# Patient Record
Sex: Male | Born: 1940
Health system: Southern US, Community
[De-identification: ages and names within clinical notes are randomized; demographics above are authoritative.]

## PROBLEM LIST (undated history)

## (undated) DIAGNOSIS — I219 Acute myocardial infarction, unspecified: Secondary | ICD-10-CM

## (undated) DIAGNOSIS — T7840XA Allergy, unspecified, initial encounter: Secondary | ICD-10-CM

## (undated) DIAGNOSIS — I4891 Unspecified atrial fibrillation: Secondary | ICD-10-CM

## (undated) DIAGNOSIS — I509 Heart failure, unspecified: Secondary | ICD-10-CM

## (undated) DIAGNOSIS — M720 Palmar fascial fibromatosis [Dupuytren]: Secondary | ICD-10-CM

## (undated) DIAGNOSIS — K56609 Unspecified intestinal obstruction, unspecified as to partial versus complete obstruction: Secondary | ICD-10-CM

## (undated) HISTORY — DX: Acute myocardial infarction, unspecified: I21.9

## (undated) HISTORY — PX: COLON SURGERY: SHX602

## (undated) HISTORY — PX: TONSILLECTOMY: SUR1361

## (undated) HISTORY — DX: Unspecified atrial fibrillation: I48.91

## (undated) HISTORY — PX: TYMPANOPLASTY: SHX33

---

## 1998-02-24 ENCOUNTER — Emergency Department (HOSPITAL_COMMUNITY): Admission: EM | Admit: 1998-02-24 | Discharge: 1998-02-24 | Payer: Self-pay | Admitting: Emergency Medicine

## 1998-03-18 ENCOUNTER — Encounter: Admission: RE | Admit: 1998-03-18 | Discharge: 1998-03-18 | Payer: Self-pay | Admitting: Family Medicine

## 1998-04-01 ENCOUNTER — Encounter: Admission: RE | Admit: 1998-04-01 | Discharge: 1998-04-01 | Payer: Self-pay | Admitting: Family Medicine

## 2009-05-11 HISTORY — PX: DUPUYTREN CONTRACTURE RELEASE: SHX1478

## 2010-04-07 ENCOUNTER — Ambulatory Visit (HOSPITAL_BASED_OUTPATIENT_CLINIC_OR_DEPARTMENT_OTHER): Admission: RE | Admit: 2010-04-07 | Discharge: 2010-04-07 | Payer: Self-pay | Admitting: Orthopedic Surgery

## 2010-06-30 NOTE — Op Note (Signed)
NAMEKAZIMIR, DEVISSER               ACCOUNT NO.:  [id number]  MEDICAL RECORD NO.:  [id number]          PATIENT TYPE:  AMB  LOCATION:  [place]                          FACILITY:  [facility]  PHYSICIAN:  Daryll Brod, M.D.       DATE OF BIRTH:  1940-06-26  DATE OF PROCEDURE:  04/07/2010 DATE OF DISCHARGE:                              OPERATIVE REPORT   PREOPERATIVE DIAGNOSIS:  Dupuytren contracture, left hand.  POSTOPERATIVE DIAGNOSIS:  Dupuytren contracture, left hand.  OPERATION:  Excision of palmar cords, thumb, index middle ring and little fingers first webspace x2 just to his left hand.  SURGEON:  Daryll Brod, MD  ASSISTANT:  Dimas Millin, RN  ANESTHESIA:  Axillary general.  ANESTHESIOLOGIST:  Leda Quail, MD  HISTORY:  The patient is a 70 year old male with a history of Dupuytren contracture cords to the middle ring little fingers with contracture of his little finger, contracture of his thumb, and first webspace contracture.  He was elected to have this excised.  Pre, peri, and postoperative course have been discussed along with risks and complications.  He is aware that there was no guarantee with surgery, possibility of infection, recurrence of injury to arteries, nerves, and tendons, incomplete relief of symptoms, and dystrophy.  In the preoperative area, the patient is seen, the extremity marked by both the patient and surgeon and antibiotic given.  PROCEDURE:  The patient was brought to the operating room where an axillary block was carried out without difficulty.  Following this, the general anesthetic was also given.  He was prepped using ChloraPrep, supine position, left arm free.  A 3-minute dry time was allowed.  Time- out taken confirming the patient and procedure.  The limb was exsanguinated with an Esmarch bandage.  The tourniquet was placed high on the upper arm was inflated to 250 mmHg.  A volar Brunner incision was made on the little finger carried down through the  subcutaneous tissue. This extended from the palm all the way out to the distal interphalangeal joint, carried down through the subcutaneous tissue. Bleeders were electrocauterized with bipolar.  Palmar fascia was identified at its attachment to the carpal retinaculum.  The neurovascular structures were identified.  The carpal retinaculum juncture where the palmar fascia was then cut across the entire palm. This allowed the cord to be elevated and traced distally.  The cords were then separated middle ring and little, this allowed visualization to each of the proper digital nerves along with common digital nerves to each of the fingers.  The little finger cord was traced distally.  This went up the radial side of his finger.  A second cord was present on the abductor digiti quinti going out to the level of the middle phalanx. With blunt and sharp dissection, this was dissected free.  The neurovascular bundles both radially and ulnarly were identified and protected throughout the procedure.  The entire cord was excised.  The finger came fully straight, separate incision was then made over the ring finger, carried down through the subcutaneous tissue.  The skin bridge was left intact.  The palmar fascia proximally was elevated allowing visualization  to the neurovascular bundles.  The cord was then excised.  This was to the level of the A2 pulley.  Care was taken to protect neurovascular structures both radially and ulnarly.  The cord was sent to pathology along with the little finger cord.  Separate incision was then made on the volar again, a Brunner incision to the middle finger.  Again, the cord to the middle finger was traced distally inserting on the A2 pulley.  This was excised and sent protecting the neurovascular bundles.  The cord to the thumb was attended to next. This involved a cord up the radial side of the digit.  Volar Brunner type incision was made.  This was carried down  through the subcutaneous tissue.  Bleeders were again electrocauterized with bipolar.  The radial and ulnar digital artery and nerves were identified and protected.  The cord was then removed to the level of the distal proximal phalanx.  This allowed the thumb metacarpophalangeal joint to come nearly straight.  A secondary cord was attached to the transverse palmar fascia.  This proceeded over to the index finger.  This was traced from the radial cord on his thumb across to the index finger.  A separate incision was then made in the webspace allowing visualization of the neurovascular structures and this cord was excised in toto.  This was also sent to pathology along with the thumb cord.  A separate incision was then made over the distal webspace where another cord was identified.  This was transected and removing a segment approximately a 0.5 cm to 1 cm in length.  This allowed the webspace to then advance.  The wounds were copiously irrigated with saline.  Thrombin spray was then sprayed into each of the wound.  A doubled over vessel loop was then passed at little finger and each of the middle and ring brought out proximally.  These were converted to lysed and the skin closed with interrupted 5-0 Vicryl Rapide sutures.  A compressive dressing was applied.  The tourniquet was deflated.  All fingers were immediately pinked.  A dorsal splint was applied.  The dressing completed and the patient was taken to the recovery room for observation in satisfactory condition.  Tourniquet time was just under 2 hours.  The patient tolerated the procedure well. He will be discharged home to return to the [facility] in 1 week, on Vicodin.          ______________________________ Daryll Brod, M.D.     GK/MEDQ  D:  04/07/2010  T:  04/08/2010  Job:  [id number]  Electronically Signed by Daryll Brod M.D. on 06/30/2010 12:27:58 PM

## 2010-07-22 LAB — POCT HEMOGLOBIN-HEMACUE: Hemoglobin: 16.9 g/dL (ref 13.0–17.0)

## 2014-08-30 ENCOUNTER — Other Ambulatory Visit: Payer: Self-pay | Admitting: Gastroenterology

## 2015-04-26 ENCOUNTER — Encounter (HOSPITAL_BASED_OUTPATIENT_CLINIC_OR_DEPARTMENT_OTHER): Payer: Self-pay | Admitting: *Deleted

## 2015-04-26 ENCOUNTER — Other Ambulatory Visit: Payer: Self-pay | Admitting: Orthopedic Surgery

## 2015-05-02 ENCOUNTER — Encounter (HOSPITAL_BASED_OUTPATIENT_CLINIC_OR_DEPARTMENT_OTHER)
Admission: RE | Disposition: A | Discharge status: Home / Self Care | Payer: Self-pay | Source: Ambulatory Visit | Attending: Orthopedic Surgery

## 2015-05-02 ENCOUNTER — Ambulatory Visit (HOSPITAL_BASED_OUTPATIENT_CLINIC_OR_DEPARTMENT_OTHER): Payer: Medicare HMO | Admitting: Anesthesiology

## 2015-05-02 ENCOUNTER — Encounter (HOSPITAL_BASED_OUTPATIENT_CLINIC_OR_DEPARTMENT_OTHER): Payer: Self-pay | Admitting: Orthopedic Surgery

## 2015-05-02 ENCOUNTER — Ambulatory Visit (HOSPITAL_BASED_OUTPATIENT_CLINIC_OR_DEPARTMENT_OTHER)
Admission: RE | Admit: 2015-05-02 | Discharge: 2015-05-02 | Disposition: A | Discharge status: Home / Self Care | Payer: Medicare HMO | Source: Ambulatory Visit | Attending: Orthopedic Surgery | Admitting: Orthopedic Surgery

## 2015-05-02 DIAGNOSIS — M720 Palmar fascial fibromatosis [Dupuytren]: Secondary | ICD-10-CM | POA: Diagnosis present

## 2015-05-02 DIAGNOSIS — F17200 Nicotine dependence, unspecified, uncomplicated: Secondary | ICD-10-CM | POA: Diagnosis not present

## 2015-05-02 DIAGNOSIS — Z79899 Other long term (current) drug therapy: Secondary | ICD-10-CM | POA: Insufficient documentation

## 2015-05-02 DIAGNOSIS — M19041 Primary osteoarthritis, right hand: Secondary | ICD-10-CM | POA: Insufficient documentation

## 2015-05-02 HISTORY — PX: FASCIECTOMY: SHX6525

## 2015-05-02 HISTORY — DX: Palmar fascial fibromatosis (dupuytren): M72.0

## 2015-05-02 HISTORY — DX: Allergy, unspecified, initial encounter: T78.40XA

## 2015-05-02 SURGERY — FASCIECTOMY, PALM
Anesthesia: Monitor Anesthesia Care | Site: Hand | Laterality: Right

## 2015-05-02 MED ORDER — HYDROCODONE-ACETAMINOPHEN 10-325 MG PO TABS: 1.0000 | ORAL_TABLET | Freq: Four times a day (QID) | ORAL | Status: DC | PRN

## 2015-05-02 MED ORDER — MIDAZOLAM HCL 2 MG/2ML IJ SOLN
1.0000 mg | INTRAMUSCULAR | Status: DC | PRN
  Administered 2015-05-02: 2 mg via INTRAVENOUS

## 2015-05-02 MED ORDER — CEFAZOLIN SODIUM-DEXTROSE 2-3 GM-% IV SOLR
INTRAVENOUS | Status: AC
  Filled 2015-05-02: qty 50

## 2015-05-02 MED ORDER — CEFAZOLIN SODIUM-DEXTROSE 2-3 GM-% IV SOLR: 2.0000 g | INTRAVENOUS | Status: DC

## 2015-05-02 MED ORDER — SCOPOLAMINE 1 MG/3DAYS TD PT72: 1.0000 | MEDICATED_PATCH | Freq: Once | TRANSDERMAL | Status: DC

## 2015-05-02 MED ORDER — CEFAZOLIN SODIUM-DEXTROSE 2-3 GM-% IV SOLR
2.0000 g | INTRAVENOUS | Status: AC
  Administered 2015-05-02: 2 g via INTRAVENOUS

## 2015-05-02 MED ORDER — HYDROCODONE-ACETAMINOPHEN 7.5-325 MG PO TABS: 1.0000 | ORAL_TABLET | Freq: Once | ORAL | Status: DC | PRN

## 2015-05-02 MED ORDER — LACTATED RINGERS IV SOLN
INTRAVENOUS | Status: DC
  Administered 2015-05-02 (×2): via INTRAVENOUS

## 2015-05-02 MED ORDER — FENTANYL CITRATE (PF) 100 MCG/2ML IJ SOLN
50.0000 ug | INTRAMUSCULAR | Status: DC | PRN
  Administered 2015-05-02: 100 ug via INTRAVENOUS

## 2015-05-02 MED ORDER — PROMETHAZINE HCL 25 MG/ML IJ SOLN: 6.2500 mg | INTRAMUSCULAR | Status: DC | PRN

## 2015-05-02 MED ORDER — PROPOFOL 500 MG/50ML IV EMUL
INTRAVENOUS | Status: DC | PRN
  Administered 2015-05-02: 100 ug/kg/min via INTRAVENOUS

## 2015-05-02 MED ORDER — MEPERIDINE HCL 25 MG/ML IJ SOLN: 6.2500 mg | INTRAMUSCULAR | Status: DC | PRN

## 2015-05-02 MED ORDER — CHLORHEXIDINE GLUCONATE 4 % EX LIQD: 60.0000 mL | Freq: Once | CUTANEOUS | Status: DC

## 2015-05-02 MED ORDER — PROPOFOL 10 MG/ML IV BOLUS
INTRAVENOUS | Status: AC
  Filled 2015-05-02: qty 20

## 2015-05-02 MED ORDER — DEXAMETHASONE SODIUM PHOSPHATE 4 MG/ML IJ SOLN
INTRAMUSCULAR | Status: DC | PRN
  Administered 2015-05-02: 10 mg via INTRAVENOUS

## 2015-05-02 MED ORDER — THROMBIN 20000 UNITS EX KIT
PACK | CUTANEOUS | Status: DC | PRN
  Administered 2015-05-02: 5000 [IU] via TOPICAL

## 2015-05-02 MED ORDER — PROPOFOL 10 MG/ML IV BOLUS
INTRAVENOUS | Status: DC | PRN
  Administered 2015-05-02: 20 mg via INTRAVENOUS

## 2015-05-02 MED ORDER — THROMBIN 5000 UNITS EX SOLR
CUTANEOUS | Status: AC
  Filled 2015-05-02: qty 5000

## 2015-05-02 MED ORDER — HYDROMORPHONE HCL 1 MG/ML IJ SOLN: 0.2500 mg | INTRAMUSCULAR | Status: DC | PRN

## 2015-05-02 MED ORDER — GLYCOPYRROLATE 0.2 MG/ML IJ SOLN: 0.2000 mg | Freq: Once | INTRAMUSCULAR | Status: DC | PRN

## 2015-05-02 MED ORDER — ONDANSETRON HCL 4 MG/2ML IJ SOLN
INTRAMUSCULAR | Status: DC | PRN
  Administered 2015-05-02: 4 mg via INTRAVENOUS

## 2015-05-02 MED ORDER — MIDAZOLAM HCL 2 MG/2ML IJ SOLN
INTRAMUSCULAR | Status: AC
  Filled 2015-05-02: qty 2

## 2015-05-02 MED ORDER — BUPIVACAINE-EPINEPHRINE (PF) 0.5% -1:200000 IJ SOLN
INTRAMUSCULAR | Status: DC | PRN
  Administered 2015-05-02: 30 mL via PERINEURAL

## 2015-05-02 MED ORDER — FENTANYL CITRATE (PF) 100 MCG/2ML IJ SOLN
INTRAMUSCULAR | Status: AC
  Filled 2015-05-02: qty 2

## 2015-05-02 SURGICAL SUPPLY — 38 items
BLADE MINI RND TIP GREEN BEAV (BLADE) ×2 IMPLANT
BLADE SURG 15 STRL LF DISP TIS (BLADE) ×1 IMPLANT
BLADE SURG 15 STRL SS (BLADE) ×1
BNDG COHESIVE 3X5 TAN STRL LF (GAUZE/BANDAGES/DRESSINGS) ×2 IMPLANT
BNDG ESMARK 4X9 LF (GAUZE/BANDAGES/DRESSINGS) ×2 IMPLANT
BNDG GAUZE ELAST 4 BULKY (GAUZE/BANDAGES/DRESSINGS) ×2 IMPLANT
CHLORAPREP W/TINT 26ML (MISCELLANEOUS) ×2 IMPLANT
CORDS BIPOLAR (ELECTRODE) ×2 IMPLANT
COVER BACK TABLE 60X90IN (DRAPES) ×2 IMPLANT
COVER MAYO STAND STRL (DRAPES) ×2 IMPLANT
CUFF TOURNIQUET SINGLE 18IN (TOURNIQUET CUFF) ×2 IMPLANT
DECANTER SPIKE VIAL GLASS SM (MISCELLANEOUS) IMPLANT
DRAPE EXTREMITY T 121X128X90 (DRAPE) ×2 IMPLANT
DRAPE SURG 17X23 STRL (DRAPES) ×2 IMPLANT
GAUZE SPONGE 4X4 12PLY STRL (GAUZE/BANDAGES/DRESSINGS) ×2 IMPLANT
GAUZE XEROFORM 1X8 LF (GAUZE/BANDAGES/DRESSINGS) ×2 IMPLANT
GLOVE BIOGEL PI IND STRL 8.5 (GLOVE) ×1 IMPLANT
GLOVE BIOGEL PI INDICATOR 8.5 (GLOVE) ×1
GLOVE ECLIPSE 6.5 STRL STRAW (GLOVE) ×2 IMPLANT
GLOVE SURG ORTHO 8.0 STRL STRW (GLOVE) ×2 IMPLANT
GOWN STRL REUS W/ TWL LRG LVL3 (GOWN DISPOSABLE) ×1 IMPLANT
GOWN STRL REUS W/TWL LRG LVL3 (GOWN DISPOSABLE) ×1
GOWN STRL REUS W/TWL XL LVL3 (GOWN DISPOSABLE) ×2 IMPLANT
LOOP VESSEL MAXI BLUE (MISCELLANEOUS) ×2 IMPLANT
NS IRRIG 1000ML POUR BTL (IV SOLUTION) ×2 IMPLANT
PACK BASIN DAY SURGERY FS (CUSTOM PROCEDURE TRAY) ×2 IMPLANT
PAD CAST 3X4 CTTN HI CHSV (CAST SUPPLIES) ×1 IMPLANT
PADDING CAST COTTON 3X4 STRL (CAST SUPPLIES) ×1
SLEEVE SCD COMPRESS KNEE MED (MISCELLANEOUS) ×2 IMPLANT
SPLINT PLASTER CAST XFAST 3X15 (CAST SUPPLIES) IMPLANT
SPLINT PLASTER XTRA FASTSET 3X (CAST SUPPLIES)
STOCKINETTE 4X48 STRL (DRAPES) ×2 IMPLANT
SUT ETHILON 4 0 PS 2 18 (SUTURE) ×2 IMPLANT
SUT SILK 2 0 FS (SUTURE) ×2 IMPLANT
SYR BULB 3OZ (MISCELLANEOUS) ×2 IMPLANT
SYR CONTROL 10ML LL (SYRINGE) ×2 IMPLANT
TOWEL OR 17X24 6PK STRL BLUE (TOWEL DISPOSABLE) ×4 IMPLANT
UNDERPAD 30X30 (UNDERPADS AND DIAPERS) ×2 IMPLANT

## 2015-05-02 NOTE — Transfer of Care (Signed)
Immediate Anesthesia Transfer of Care Note  Patient: Mario Proctor  Procedure(s) Performed: Procedure(s) with comments: FASCIECTOMY RIGHT SMALL FINGER (Right) - axillary block in preop  Patient Location: PACU  Anesthesia Type:MAC combined with regional for post-op pain  Level of Consciousness: awake, alert  and oriented  Airway & Oxygen Therapy: Patient Spontanous Breathing and Patient connected to face mask oxygen  Post-op Assessment: Report given to RN and Post -op Vital signs reviewed and stable  Post vital signs: Reviewed and stable  Last Vitals:  Filed Vitals:   05/02/15 0955 05/02/15 1000  BP: 146/75 129/67  Pulse: 82 82  Temp:    Resp: 16     Complications: No apparent anesthesia complications

## 2015-05-02 NOTE — Discharge Instructions (Addendum)

## 2015-05-02 NOTE — Brief Op Note (Signed)
05/02/2015  11:19 AM  PATIENT:  Mario Proctor  74 y.o. male  PRE-OPERATIVE DIAGNOSIS:  DUPURTREN'S RIGHT SMALL FINGER  POST-OPERATIVE DIAGNOSIS:  DUPURTREN'S RIGHT SMALL FINGER  PROCEDURE:  Procedure(s) with comments: FASCIECTOMY RIGHT SMALL FINGER (Right) - axillary block in preop  SURGEON:  Surgeon(s) and Role:    * Daryll Brod, MD - Primary  PHYSICIAN ASSISTANT:   ASSISTANTS: none   ANESTHESIA:   regional and IV sedation  EBL:  Total I/O In: 1000 [I.V.:1000] Out: -   BLOOD ADMINISTERED:none  DRAINS: vessel loops   LOCAL MEDICATIONS USED:  NONE  SPECIMEN:  Excision  DISPOSITION OF SPECIMEN:  PATHOLOGY  COUNTS:  YES  TOURNIQUET:   Total Tourniquet Time Documented: Upper Arm (Right) - 41 minutes Total: Upper Arm (Right) - 41 minutes   DICTATION: .Other Dictation: Dictation Number [id number]  PLAN OF CARE: Discharge to home after PACU  PATIENT DISPOSITION:  PACU - hemodynamically stable.

## 2015-05-02 NOTE — Anesthesia Procedure Notes (Signed)
Anesthesia Regional Block:  Axillary brachial plexus block  Pre-Anesthetic Checklist: ,, timeout performed, Correct Patient, Correct Site, Correct Laterality, Correct Procedure, Correct Position, site marked, Risks and benefits discussed, Surgical consent,  Pre-op evaluation,  Post-op pain management  Laterality: Right  Prep: chloraprep       Needles:  Injection technique: Single-shot  Needle Type: Stimulator Needle - 40     Needle Length: 4cm 4 cm Needle Gauge: 22 and 22 G    Additional Needles:  Procedures: ultrasound guided (picture in chart) Axillary brachial plexus block Narrative:  Injection made incrementally with aspirations every 5 mL. Anesthesiologist: Nolon Nations  Additional Notes: BP cuff, EKG monitors applied. Sedation begun. Nerve location verified with U/S. Anesthetic injected incrementally, slowly , and after neg aspirations under direct u/s guidance. Good perineural spread. Tolerated well.

## 2015-05-02 NOTE — Op Note (Signed)
Mario Proctor, Mario Proctor              ACCOUNT NO.:  [id number]  MEDICAL RECORD NO.:  [id number]  LOCATION:                                 FACILITY:  PHYSICIAN:  Mario Proctor, M.D.            DATE OF BIRTH:  DATE OF PROCEDURE:  05/02/2015 DATE OF DISCHARGE:                              OPERATIVE REPORT   POSTOPERATIVE DIAGNOSIS:  Dupuytren's contracture, right small finger with flexion deformity to the metacarpophalangeal, proximal interphalangeal, distal interphalangeal joints.  POSTOPERATIVE DIAGNOSIS:  Dupuytren's contracture, right small finger with flexion deformity to the metacarpophalangeal, proximal interphalangeal, distal interphalangeal joints.  OPERATION:  Fasciectomy of right small finger.  SURGEON:  Mario Proctor, M.D.  ANESTHESIA:  Axillary block with sedation.  ANESTHESIOLOGIST:  Cleon Dew. Kalman Shan, M.D.  HISTORY:  The patient is a 74 year old male with a history of Dupuytren's contracture to his right hand small finger with a radial- sided cord going up to the distal phalanx.  He has elected to undergo fasciotomy, being aware of other alternatives.  Pre, peri and postoperative course have been discussed along with risks and complications.  He is aware that there is no guarantee with the surgery; possibility of infection; recurrence of injury to arteries, nerves, tendons; incomplete relief of symptoms; and dystrophy and the fact that he may not regain full extension of the PIP and DIP joints, having surgery to correct this to full extension.  He may not be capable of maintaining this following the excision.  He is potential for extension and recurrence of Dupuytren's rotatory nature.  The patient was seen in the preoperative area, the extremity marked by both the patient and surgeon.  Antibiotic given.  DESCRIPTION OF PROCEDURE:  The patient was brought to the operating room.  After an axillary block was carried out without difficulty, he was prepped using ChloraPrep,  supine position with the right arm free. A 3-minute dry time was allowed.  Time-out taken, confirming the patient and procedure.  The limb was exsanguinated with an Esmarch bandage. Tourniquet placed high on the arm was inflated to 250 mmHg.  A volar Bruner incision was made in the midpalm of the small finger and carried down through the subcutaneous tissue.  The cord was immediately identified.  This was traced distally extending the incision gradually out to the level of the distal interphalangeal joint.  Neurovascular bundles were identified and protected throughout the procedure.  The cord was identified proximally, this was traced distally.  A natatory cord was present going to the ring finger.  This was transected.  Neurovascular bundles were spiraled around this.  These were maintained especially on the radial side. There was extension over to the ulnar aspect of the small finger with the central cord.  There was a large lateral digital sheet cord with this arising off from the lumbrical muscle on the radial side.  This was traced distally.  There was a second spiral with the neurovascular bundle at the level of the PIP joint.  This was traced up to the distal interphalangeal joint where the cord was transected just proximal to the distal interphalangeal join. Due to the fact that there was  separation of the artery and nerve, with the artery going dorsal to the cord and the nerve going volar to the cord, it was felt too dangerous to try to follow the artery.  The artery was now approximately 0.5 mm in size.  The cord was transected.  This allowed the distal interphalangeal joint to come fully straight. Following transection of the attachments of the lateral digital sheet cord and the connections to the middle phalanx the PIP joint came fully straight also.  The specimen was sent to Pathology. The neurovascular bundles were traced over their entire course, found to be intact.  The  wound was copiously irrigated with saline.  Thrombin was then instilled.  A doubled-over vessel loop drain was placed to the depths of the wound and these were converted to wise.  The skin was then closed with interrupted 4-0 nylon sutures.  The tourniquet was deflated. Bleeders were electrocauterized throughout the procedure.  The finger was immediately pinked.  A dorsal splint was then applied.  The patient was taken to the recovery room for observation in satisfactory condition.  He will be discharged to home to return to the [facility] in 1 week, on Norco.          ______________________________ Mario Proctor, M.D.     GK/MEDQ  D:  05/02/2015  T:  05/02/2015  Job:  [id number]

## 2015-05-02 NOTE — Progress Notes (Signed)
Assisted Dr. Germeroth with right, ultrasound guided, axillary block. Side rails up, monitors on throughout procedure. See vital signs in flow sheet. Tolerated Procedure well. 

## 2015-05-02 NOTE — Anesthesia Preprocedure Evaluation (Signed)
Anesthesia Evaluation  Patient identified by MRN, date of birth, ID band Patient awake    Reviewed: Allergy & Precautions, NPO status , Patient's Chart, lab work & pertinent test results  Airway Mallampati: II  TM Distance: >3 FB Neck ROM: Full    Dental no notable dental hx.    Pulmonary Current Smoker,    Pulmonary exam normal breath sounds clear to auscultation       Cardiovascular negative cardio ROS Normal cardiovascular exam Rhythm:Regular Rate:Normal     Neuro/Psych negative neurological ROS  negative psych ROS   GI/Hepatic negative GI ROS, Neg liver ROS,   Endo/Other  negative endocrine ROS  Renal/GU negative Renal ROS     Musculoskeletal negative musculoskeletal ROS (+)   Abdominal   Peds  Hematology negative hematology ROS (+)   Anesthesia Other Findings   Reproductive/Obstetrics negative OB ROS                             Anesthesia Physical Anesthesia Plan  ASA: II  Anesthesia Plan: Regional   Post-op Pain Management:    Induction: Intravenous  Airway Management Planned:   Additional Equipment:   Intra-op Plan:   Post-operative Plan:   Informed Consent: I have reviewed the patients History and Physical, chart, labs and discussed the procedure including the risks, benefits and alternatives for the proposed anesthesia with the patient or authorized representative who has indicated his/her understanding and acceptance.   Dental advisory given  Plan Discussed with: CRNA  Anesthesia Plan Comments:         Anesthesia Quick Evaluation

## 2015-05-02 NOTE — Op Note (Signed)
Dictation Number [id number]

## 2015-05-02 NOTE — H&P (Signed)
  Mario Proctor is an 74 y.o. male.   Chief Complaint: He is a 74 year old right hand dominant male who has not been seen in six to seven years. He states that he has had an excellent result from the fasciectomy on his left hand and is desirous of proceeding to have his right Dupuytren's taken care of. His little finger has progressed over the past six months. He has no history of injury. He does smoke. He is advised to quit and the reasons for this. He is well aware of the etiology of Dupuytren's. He has no history of diabetes, thyroid problems. 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 He has no history of gout. Family history is negative. He is desirous of proceeding to have his right side taken care of.  HPI: see above  Past Medical History  Diagnosis Date  . Allergy   . Dupuytren contracture     right sm finger    Past Surgical History  Procedure Laterality Date  . Colon surgery      bowel blockage  . Tympanoplasty Right   . Tonsillectomy    . Dupuytren contracture release Left 2011    History reviewed. No pertinent family history. Social History:  reports that he has been smoking.  He does not have any smokeless tobacco history on file. He reports that he drinks alcohol. He reports that he does not use illicit drugs.  Allergies:  Allergies  Allergen Reactions  . Oxycodone Rash    Medications Prior to Admission  Medication Sig Dispense Refill  . loratadine (CLARITIN) 10 MG tablet Take 10 mg by mouth daily.      No results found for this or any previous visit (from the past 48 hour(s)).  No results found.   Pertinent items are noted in HPI.  Height 5\' 11"  (1.803 m), weight 72.576 kg (160 lb).  General appearance: alert, cooperative and appears stated age Head: Normocephalic, without obvious abnormality Neck: no JVD Resp: clear to auscultation bilaterally Cardio: regular rate and rhythm, S1, S2 normal, no murmur, click, rub or gallop GI: soft,  non-tender; bowel sounds normal; no masses,  no organomegaly Extremities: contracture right small finger Pulses: 2+ and symmetric Skin: Skin color, texture, turgor normal. No rashes or lesions Neurologic: Grossly normal Incision/Wound: na  Assessment/Plan Assessment:  1. 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 We have discussed various treatment alternatives of the Dupuytren's contracture including aponeurotomy, Xiaflex injection, fasciotomy, fasciectomy. He has had fasciectomy on his left side. He would like to proceed with fasciectomy on his right small finger. Postoperative course was discussed along with risks and complications. He is aware that there is no guarantee with the surgery, possibility of infection, recurrence, injury to arteries, nerves, tendons, incomplete retun of full extension to the PIP joint, dystrophy, loss of a finger, numbness and tingling. He is scheduled for fasciectomy right small fingers as an outpatient under regional anesthesia.   Krzysztof Reichelt R 05/02/2015, 8:42 AM

## 2015-05-02 NOTE — Anesthesia Postprocedure Evaluation (Signed)
Anesthesia Post Note  Patient: Mario Proctor  Procedure(s) Performed: Procedure(s) (LRB): FASCIECTOMY RIGHT SMALL FINGER (Right)  Patient location during evaluation: PACU Anesthesia Type: MAC and Regional Level of consciousness: awake and alert Pain management: pain level controlled Vital Signs Assessment: post-procedure vital signs reviewed and stable Respiratory status: spontaneous breathing Cardiovascular status: stable Anesthetic complications: no    Last Vitals:  Filed Vitals:   05/02/15 1125 05/02/15 1300  BP: 122/76 149/89  Pulse: 76 76  Temp: 36.6 C 36.4 C  Resp:  16    Last Pain: There were no vitals filed for this visit.               Nolon Nations

## 2015-05-03 ENCOUNTER — Encounter (HOSPITAL_BASED_OUTPATIENT_CLINIC_OR_DEPARTMENT_OTHER): Payer: Self-pay | Admitting: Orthopedic Surgery

## 2015-05-03 MED FILL — Thrombin For Soln 5000 Unit: CUTANEOUS | Qty: 5000 | Status: AC

## 2016-05-05 DIAGNOSIS — Z0000 Encounter for general adult medical examination without abnormal findings: Secondary | ICD-10-CM | POA: Diagnosis not present

## 2016-05-05 DIAGNOSIS — Z6821 Body mass index (BMI) 21.0-21.9, adult: Secondary | ICD-10-CM | POA: Diagnosis not present

## 2016-06-23 DIAGNOSIS — I498 Other specified cardiac arrhythmias: Secondary | ICD-10-CM | POA: Diagnosis not present

## 2016-06-23 DIAGNOSIS — J441 Chronic obstructive pulmonary disease with (acute) exacerbation: Secondary | ICD-10-CM | POA: Diagnosis not present

## 2016-06-23 DIAGNOSIS — J40 Bronchitis, not specified as acute or chronic: Secondary | ICD-10-CM | POA: Diagnosis not present

## 2016-07-22 DIAGNOSIS — J40 Bronchitis, not specified as acute or chronic: Secondary | ICD-10-CM | POA: Diagnosis not present

## 2016-07-22 DIAGNOSIS — I498 Other specified cardiac arrhythmias: Secondary | ICD-10-CM | POA: Diagnosis not present

## 2016-07-24 DIAGNOSIS — I499 Cardiac arrhythmia, unspecified: Secondary | ICD-10-CM | POA: Diagnosis not present

## 2016-08-13 DIAGNOSIS — H16002 Unspecified corneal ulcer, left eye: Secondary | ICD-10-CM | POA: Diagnosis not present

## 2016-08-18 DIAGNOSIS — H16042 Marginal corneal ulcer, left eye: Secondary | ICD-10-CM | POA: Diagnosis not present

## 2016-08-20 DIAGNOSIS — T1502XD Foreign body in cornea, left eye, subsequent encounter: Secondary | ICD-10-CM | POA: Diagnosis not present

## 2016-09-11 DIAGNOSIS — I493 Ventricular premature depolarization: Secondary | ICD-10-CM | POA: Diagnosis not present

## 2016-09-11 DIAGNOSIS — Z136 Encounter for screening for cardiovascular disorders: Secondary | ICD-10-CM | POA: Diagnosis not present

## 2016-09-11 DIAGNOSIS — R0602 Shortness of breath: Secondary | ICD-10-CM | POA: Diagnosis not present

## 2016-09-11 DIAGNOSIS — J432 Centrilobular emphysema: Secondary | ICD-10-CM | POA: Diagnosis not present

## 2016-09-18 DIAGNOSIS — R0602 Shortness of breath: Secondary | ICD-10-CM | POA: Diagnosis not present

## 2016-09-18 DIAGNOSIS — R69 Illness, unspecified: Secondary | ICD-10-CM | POA: Diagnosis not present

## 2016-10-06 DIAGNOSIS — R0602 Shortness of breath: Secondary | ICD-10-CM | POA: Diagnosis not present

## 2016-10-06 DIAGNOSIS — J432 Centrilobular emphysema: Secondary | ICD-10-CM | POA: Diagnosis not present

## 2016-10-06 DIAGNOSIS — Z136 Encounter for screening for cardiovascular disorders: Secondary | ICD-10-CM | POA: Diagnosis not present

## 2016-10-06 DIAGNOSIS — I493 Ventricular premature depolarization: Secondary | ICD-10-CM | POA: Diagnosis not present

## 2016-10-12 DIAGNOSIS — J441 Chronic obstructive pulmonary disease with (acute) exacerbation: Secondary | ICD-10-CM | POA: Diagnosis not present

## 2016-10-12 DIAGNOSIS — H6692 Otitis media, unspecified, left ear: Secondary | ICD-10-CM | POA: Diagnosis not present

## 2016-10-15 ENCOUNTER — Ambulatory Visit (INDEPENDENT_AMBULATORY_CARE_PROVIDER_SITE_OTHER)
Admission: RE | Admit: 2016-10-15 | Discharge: 2016-10-15 | Disposition: A | Discharge status: Home / Self Care | Payer: Medicare HMO | Source: Ambulatory Visit | Attending: Pulmonary Disease | Admitting: Pulmonary Disease

## 2016-10-15 ENCOUNTER — Ambulatory Visit (INDEPENDENT_AMBULATORY_CARE_PROVIDER_SITE_OTHER): Payer: Medicare HMO | Admitting: Pulmonary Disease

## 2016-10-15 ENCOUNTER — Encounter: Payer: Self-pay | Admitting: Pulmonary Disease

## 2016-10-15 VITALS — BP 142/70 | HR 85 | Ht 71.0 in | Wt 152.8 lb

## 2016-10-15 DIAGNOSIS — F17210 Nicotine dependence, cigarettes, uncomplicated: Secondary | ICD-10-CM | POA: Diagnosis not present

## 2016-10-15 DIAGNOSIS — R0602 Shortness of breath: Secondary | ICD-10-CM

## 2016-10-15 DIAGNOSIS — R69 Illness, unspecified: Secondary | ICD-10-CM | POA: Diagnosis not present

## 2016-10-15 MED ORDER — ALBUTEROL SULFATE HFA 108 (90 BASE) MCG/ACT IN AERS: 2.0000 | INHALATION_SPRAY | Freq: Four times a day (QID) | RESPIRATORY_TRACT | 6 refills | Status: DC | PRN

## 2016-10-15 NOTE — Patient Instructions (Signed)
Will schedule you for chest x-ray today and pulmonary function tests We give a prescription for albuterol rescue inhaler Continue to work on quitting smoking Return to clinic in 1-2 months.

## 2016-10-15 NOTE — Progress Notes (Signed)
Mario Proctor    [id number]    [birth year]  Primary Care Physician:System, Pcp Not In  Referring Physician: [name], MD [address], [place] [address]  Chief complaint:  Consult for evaluation of dyspnea  HPI:  [name] is a 76 year old with active smoking history, allergies. He has symptoms of dyspnea on exertion for the past several years. He gets short of breath with excessive physical activity but is able to carry out his normal daily activities. He is able to climb a flight of stairs, walk to his mailbox. 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 He has chronic cough with minimal sputum production. No wheezing, hemoptysis, fevers, chills. He had been on albuterol many years ago and is currently not using any inhaler medication.   He was evaluated by Dr. Einar Gip for frequent PVCs. He was unable to complete the treadmill stress test due to marked dyspnea and PVCs. An echocardiogram and a pharmacologic stress test has been done and he has a follow-up in cardiology clinic this week.  Pets: Cats, dog. No birds, exotic pets, farm animals Occupation: Worked as a Geophysicist/field seismologist for [employer]. Exposures: Has some exposure to lead in his line of work. Denies exposure to asbestos, dust, etc., Smoking history: 54-pack-year smoking history. Continues to smoke 1 pack per day  Outpatient Encounter Prescriptions as of 10/15/2016  Medication Sig  . [DISCONTINUED] HYDROcodone-acetaminophen (NORCO) 10-325 MG tablet Take 1 tablet by mouth every 6 (six) hours as needed.  . [DISCONTINUED] loratadine (CLARITIN) 10 MG tablet Take 10 mg by mouth daily.   No facility-administered encounter medications on file as of 10/15/2016.     Allergies as of 10/15/2016 - Review Complete 10/15/2016  Allergen Reaction Noted  . Oxycodone Rash 04/26/2015    Past Medical History:  Diagnosis Date  . Allergy   . Dupuytren contracture    right sm finger    Past Surgical  History:  Procedure Laterality Date  . COLON SURGERY     bowel blockage  . DUPUYTREN CONTRACTURE RELEASE Left 2011  . FASCIECTOMY Right 05/02/2015   Procedure: FASCIECTOMY RIGHT SMALL FINGER;  Surgeon: Daryll Brod, MD;  Location: [facility];  Service: Orthopedics;  Laterality: Right;  axillary block in preop  . TONSILLECTOMY    . TYMPANOPLASTY Right     Family History  Problem Relation Age of Onset  . Cancer Brother        "in heart and lungs"     Social History   Social History  . Marital status: Married    Spouse name: N/A  . Number of children: N/A  . Years of education: N/A   Occupational History  . Not on file.   Social History Main Topics  . Smoking status: Current Every Day Smoker    Packs/day: 1.00    Years: 54.00    Types: Cigarettes  . Smokeless tobacco: Never Used  . Alcohol use Yes     Comment: social  . Drug use: No  . Sexual activity: Not on file   Other Topics Concern  . Not on file   Social History Narrative  . No narrative on file    Review of systems: Review of Systems  Constitutional: Negative for fever and chills.  HENT: Negative.   Eyes: Negative for blurred vision.  Respiratory: as per HPI  Cardiovascular: Negative for chest pain and palpitations.  Gastrointestinal: Negative for  vomiting, diarrhea, blood per rectum. Genitourinary: Negative for dysuria, urgency, frequency and hematuria.  Musculoskeletal: Negative for myalgias, back pain and joint pain.  Skin: Negative for itching and rash.  Neurological: Negative for dizziness, tremors, focal weakness, seizures and loss of consciousness.  Endo/Heme/Allergies: Negative for environmental allergies.  Psychiatric/Behavioral: Negative for depression, suicidal ideas and hallucinations.  All other systems reviewed and are negative.  Physical Exam: Blood pressure (!) 142/70, pulse 85, height 5\' 11"  (1.803 m), weight 152 lb 12.8 oz (69.3 kg), SpO2 97 %. Gen:      No acute  distress HEENT:  EOMI, sclera anicteric Neck:     No masses; no thyromegaly Lungs:    Clear to auscultation bilaterally; normal respiratory effort CV:         Regular rate and rhythm; no murmurs Abd:      + bowel sounds; soft, non-tender; no palpable masses, no distension Ext:    No edema; adequate peripheral perfusion Skin:      Warm and dry; no rash Neuro: alert and oriented x 3 Psych: normal mood and affect  Data Reviewed: Treadmill stress stress 07/24/16 Suboptimal treadmill stress test due to marked dyspnea, frequent PVC  Assessment:  Evaluation for COPD, chronic bronchitis Likely has emphysema from heavy smoking. We will evaluate by getting a chest x-ray and pulmonary function tests. I'll start him on an albuterol rescue inhaler for now. We will reevaluate at return visit and determine if he needs to be on long-term controller medication after review of PFTs. He will need ambulatory O2 assessment as well  Active smoker Continues to smoke 1 pack per day. He has tried nicotine in the past without success. The discussion that, Wellbutrin but is not interested in those at present and wants to quit on his own. Time spent counseling-5 Minutes  Plan/Recommendations: - PFTs, CXR - Albuterol inhaler PRN - Smoking cessation.   Marshell Garfinkel MD [facility] Pager [phone] 10/15/2016, 11:37 AM  CC: [name], MD

## 2016-10-16 DIAGNOSIS — I5042 Chronic combined systolic (congestive) and diastolic (congestive) heart failure: Secondary | ICD-10-CM | POA: Diagnosis not present

## 2016-10-16 DIAGNOSIS — J432 Centrilobular emphysema: Secondary | ICD-10-CM | POA: Diagnosis not present

## 2016-10-16 DIAGNOSIS — R0602 Shortness of breath: Secondary | ICD-10-CM | POA: Diagnosis not present

## 2016-10-16 DIAGNOSIS — I493 Ventricular premature depolarization: Secondary | ICD-10-CM | POA: Diagnosis not present

## 2016-10-20 DIAGNOSIS — R69 Illness, unspecified: Secondary | ICD-10-CM | POA: Diagnosis not present

## 2016-10-20 DIAGNOSIS — I4891 Unspecified atrial fibrillation: Secondary | ICD-10-CM | POA: Diagnosis not present

## 2016-10-20 DIAGNOSIS — J9801 Acute bronchospasm: Secondary | ICD-10-CM | POA: Diagnosis not present

## 2016-10-20 DIAGNOSIS — R0682 Tachypnea, not elsewhere classified: Secondary | ICD-10-CM | POA: Diagnosis not present

## 2016-10-20 DIAGNOSIS — R0602 Shortness of breath: Secondary | ICD-10-CM | POA: Diagnosis not present

## 2016-10-20 DIAGNOSIS — R062 Wheezing: Secondary | ICD-10-CM | POA: Diagnosis not present

## 2016-10-30 ENCOUNTER — Ambulatory Visit
Admission: RE | Admit: 2016-10-30 | Discharge: 2016-10-30 | Disposition: A | Discharge status: Home / Self Care | Payer: Medicare HMO | Source: Ambulatory Visit | Attending: Otolaryngology | Admitting: Otolaryngology

## 2016-10-30 ENCOUNTER — Other Ambulatory Visit: Payer: Self-pay | Admitting: Otolaryngology

## 2016-10-30 DIAGNOSIS — H7292 Unspecified perforation of tympanic membrane, left ear: Secondary | ICD-10-CM | POA: Diagnosis not present

## 2016-10-30 DIAGNOSIS — I5042 Chronic combined systolic (congestive) and diastolic (congestive) heart failure: Secondary | ICD-10-CM | POA: Diagnosis not present

## 2016-10-30 DIAGNOSIS — J322 Chronic ethmoidal sinusitis: Secondary | ICD-10-CM

## 2016-10-30 DIAGNOSIS — J320 Chronic maxillary sinusitis: Secondary | ICD-10-CM | POA: Diagnosis not present

## 2016-10-30 DIAGNOSIS — J339 Nasal polyp, unspecified: Secondary | ICD-10-CM

## 2016-10-30 DIAGNOSIS — J330 Polyp of nasal cavity: Secondary | ICD-10-CM | POA: Diagnosis not present

## 2016-10-30 DIAGNOSIS — H6062 Unspecified chronic otitis externa, left ear: Secondary | ICD-10-CM | POA: Diagnosis not present

## 2016-10-30 DIAGNOSIS — H903 Sensorineural hearing loss, bilateral: Secondary | ICD-10-CM | POA: Diagnosis not present

## 2016-10-30 DIAGNOSIS — H9313 Tinnitus, bilateral: Secondary | ICD-10-CM | POA: Diagnosis not present

## 2016-10-30 DIAGNOSIS — J301 Allergic rhinitis due to pollen: Secondary | ICD-10-CM | POA: Diagnosis not present

## 2016-10-30 DIAGNOSIS — H6122 Impacted cerumen, left ear: Secondary | ICD-10-CM | POA: Diagnosis not present

## 2016-11-02 DIAGNOSIS — R0600 Dyspnea, unspecified: Secondary | ICD-10-CM | POA: Diagnosis present

## 2016-11-02 DIAGNOSIS — I255 Ischemic cardiomyopathy: Secondary | ICD-10-CM | POA: Diagnosis present

## 2016-11-02 DIAGNOSIS — R0609 Other forms of dyspnea: Secondary | ICD-10-CM

## 2016-11-02 NOTE — H&P (Signed)
OFFICE VISIT NOTES COPIED TO EPIC FOR DOCUMENTATION  . History of Present Illness Mario Page MD; Nov 06, 2016 4:33 PM) Patient words: Last O/V 09/11/2016; F/U Nuc, Echo, Abdominal Duplex, Lab results.  The patient is a 76 year old male who presents for a follow-up for Arrhythmia. I had seen the patient for evaluation of arrhythmia and ABCs noted on the EKG, seen on 09/11/2016, underwent stress testing and echocardiogram along with lab testing and presents here for follow-up. He has reduced his smoking to one half pack of cigarettes a day.  Initial treadmill patient exercised only for 83% of MPHR and markedly reduced exercise tolerance due to dyspnea, frequent PVCs and ventricular couplets and the test had to be discontinued. Due to abnormal stress test and dyspnea and frequent PVCs and hence needed further testing.  Patient is markedly reduced exercise tolerance. However if he walks even slightly brisk for 100 yards, he gets markedly dyspneic. No PND or orthopnea. He has been smoking since age 71, at least has a 49 pack year history. No recent weight changes. Denies any hemoptysis. No leg edema. He denies any symptoms to suggest angina pectoris or claudication or TIA. However he states that he doesn't necessarily to any heavy exertional activity.    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 Mild concentric hypertrophy of the left ventricle. Doppler evidence of grade I (impaired) diastolic dysfunction, elevated LAP. Left ventricle regional wall motion findings: Basal inferoseptal, Mid inferior, Mid inferoseptal hypokinesis. Moderate global hypokinesis. Severely depressed LV systolic function. Calculated EF 23%. Mild (Grade I) mitral regurgitation. Tobacco use disorder, continuous (F17.209)  55 pack year history Shortness of breath on exertion (R06.02)  Lexiscan myoview stress  test 09/18/2016: 1. The resting electrocardiogram demonstrated normal sinus rhythm, normal resting conduction, no resting arrhythmias and normal rest repolarization. Stress EKG is non-diagnostic for ischemia as it a pharmacologic stress using Lexiscan. Stress symptoms included dyspnea. Occasional PVC noted. 2. The LV is dilated both at rest and stress images. The LV end diastolic volume was 789FY. SPECT images demonstrate Medium perfusion abnormality of moderate intensity in the basal inferior, mid inferior and apical inferior myocardial wall(s) on the stress images. The defect remains relatively unchanged between rest and stress images and is a soft tissue attenuation artifact, however scar in this region without ischemia cannot be completely excluded. The left ventricular ejection fraction was calculated or visually estimated to be 25% with global hypokinesis. High risk study. Centrilobular emphysema (J43.2)  Screening for AAA (abdominal aortic aneurysm) (Z13.6)  Abdominal aortic duplex 10/06/2016: Diffuse plaque noted in the proximal, mid and distal aorta. No AAA observed.  Allergies (April Garrison; Nov 06, 2016 10:30 AM) OxyCONTIN *ANALGESICS - OPIOID*  Itching (Moderate).  Family History (April Louretta Shorten; 11-06-2016 10:26 AM) Mother  Deceased. at age 75, no health issues Father  Deceased. at age 75, no health issues Sister 5  1 living, no heart issues Brother 5  2 living, no heart issues  Social History (April Garrison; November 06, 2016 10:26 AM) Current tobacco use  Current every day smoker. 1 ppd Alcohol Use  Occasional alcohol use. Marital status  Married. Living Situation  Lives with spouse. Number of Children  3.  Past Surgical History (April Louretta Shorten; 06-Nov-2016 10:33 AM) Bowel Obstruction [1993]: Repair  Medication History (April Garrison; 11-06-16 10:35 AM) Amoxicillin (500MG Capsule, 1 Oral three times daily for ear infection, Taken starting 10/12/2016) Active. ProAir HFA (108  (90 Base)MCG/ACT Aerosol Soln,  2 puffs Inhalation as needed) Active. Medications Reconciled (Verified)  Diagnostic Studies History (April Garrison; 11-10-16 10:26 AM) Echocardiogram [10/06/2016]: Left ventricle cavity is mildly dilated. Mild concentric hypertrophy of the left ventricle. Doppler evidence of grade I (impaired) diastolic dysfunction, elevated LAP. Left ventricle regional wall motion findings: Basal inferoseptal, Mid inferior, Mid inferoseptal hypokinesis. Moderate global hypokinesis. Severely depressed LV systolic function. Calculated EF 23%. Mild (Grade I) mitral regurgitation. Nuclear stress test [09/18/2016]: 1. The resting electrocardiogram demonstrated normal sinus rhythm, normal resting conduction, no resting arrhythmias and normal rest repolarization. Stress EKG is non-diagnostic for ischemia as it a pharmacologic stress using Lexiscan. Stress symptoms included dyspnea. Occasional PVC noted. 2. The LV is dilated both at rest and stress images. The LV end diastolic volume was 758IT. SPECT images demonstrate Medium perfusion abnormality of moderate intensity in the basal inferior, mid inferior and apical inferior myocardial wall(s) on the stress images. The defect remains relatively unchanged between rest and stress images and is a soft tissue attenuation artifact, however scar in this region without ischemia cannot be completely excluded. The left ventricular ejection fraction was calculated or visually estimated to be 25% with global hypokinesis. High risk study. Abdominal Ultrasound [10/06/2016]: Diffuse plaque noted in the proximal, mid and distal aorta. No AAA observed. Treadmill stress test [07/24/2016]: Indication: Arrhythmia Resting EKG demonstrates NSR. The patient exercised according to Bruce Protocol, Total time recorded 3:46 min achieving max heart rate of 121 which was 83 % of THR for age and 5.52 METS of work. Stress terminated due to shortness of breath and fatigue.  Normal BP response. There was no ST-T changes of ischemia with exercise stress test. There were frequent PVC, ventricular couplets and multifocal PVC in recovery. Normal BP response. Rec: Suboptimal treadmill stress test due to marked dyspnea at low level exercise and frequent PVC (see above) in recovery. Consider pharmacologic stress or further cardiologic evaluation.  Other Problems (April Garrison; 11/10/2016 10:26 AM) Treadmill stress test [07/24/2016]: Indication: Arrhythmia Resting EKG demonstrates NSR. The patient exercised according to Bruce Protocol, Total time recorded 3:46 min achieving max heart rate of 121 which was 83 % of THR for age and 5.52 METS of work. Stress terminated due to shortness of breath and fatigue. Normal BP response. There was no ST-T changes of ischemia with exercise stress test. There were frequent PVC, ventricular couplets and multifocal PVC in recovery. Normal BP response. Rec: Suboptimal treadmill stress test due to marked dyspnea at low level exercise and frequent PVC (see above) in recovery. Consider pharmacologic stress or further cardiologic evaluation.    Review of Systems Mario Page, MD; 11/10/2016 4:33 PM) General Not Present- Appetite Loss and Weight Gain. Respiratory Present- Decreased Exercise Tolerance and Difficulty Breathing on Exertion. Not Present- Chronic Cough and Wakes up from Sleep Wheezing or Short of Breath. Gastrointestinal Not Present- Black, Tarry Stool and Difficulty Swallowing. Musculoskeletal Not Present- Decreased Range of Motion and Muscle Atrophy. Neurological Not Present- Attention Deficit. Psychiatric Not Present- Personality Changes and Suicidal Ideation. Endocrine Not Present- Cold Intolerance and Heat Intolerance. Hematology Not Present- Abnormal Bleeding. All other systems negative  Vitals (April Garrison; Nov 10, 2016 10:36 AM) 2016/11/10 10:26 AM Weight: 152.44 lb Height: 71in Body Surface Area: 1.88 m Body Mass  Index: 21.26 kg/m  Pulse: 72 (Regular)  P.OX: 96% (Room air) BP: 162/88 (Sitting, Left Arm, Standard)       Physical Exam Mario Page, MD; 11/10/16 4:33 PM) General Mental Status-Alert. General Appearance-Cooperative and Appears stated age. Build & Nutrition-Moderately  built.  Head and Neck Thyroid Gland Characteristics - normal size and consistency and no palpable nodules.  Chest and Lung Exam Chest and lung exam reveals -quiet, even and easy respiratory effort with no use of accessory muscles and non-tender. Auscultation Breath sounds - Bronchovesicular - Both Lung Fields. Prolonged expiration - Both Lung Fields.  Cardiovascular Cardiovascular examination reveals -normal heart sounds, regular rate and rhythm with no murmurs, carotid auscultation reveals no bruits, abdominal aorta auscultation reveals no bruits and no prominent pulsation, femoral artery auscultation bilaterally reveals normal pulses, no bruits, no thrills, normal pedal pulses bilaterally and no digital clubbing, cyanosis, edema, increased warmth or tenderness.  Abdomen Palpation/Percussion Normal exam - Non Tender and No hepatosplenomegaly.  Neurologic Neurologic evaluation reveals -alert and oriented x 3 with no impairment of recent or remote memory. Motor-Grossly intact without any focal deficits.  Musculoskeletal Global Assessment Left Lower Extremity - no deformities, masses or tenderness, no known fractures. Right Lower Extremity - no deformities, masses or tenderness, no known fractures.    Assessment & Plan Mario Page MD; 10/16/2016 4:31 PM) Shortness of breath on exertion (R06.02) Story: Lexiscan myoview stress test 09/18/2016: 1. The resting electrocardiogram demonstrated normal sinus rhythm, normal resting conduction, no resting arrhythmias and normal rest repolarization. Stress EKG is non-diagnostic for ischemia as it a pharmacologic stress using Lexiscan.  Stress symptoms included dyspnea. Occasional PVC noted. 2. The LV is dilated both at rest and stress images. The LV end diastolic volume was 466ZL. SPECT images demonstrate Medium perfusion abnormality of moderate intensity in the basal inferior, mid inferior and apical inferior myocardial wall(s) on the stress images. The defect remains relatively unchanged between rest and stress images and is a soft tissue attenuation artifact, however scar in this region without ischemia cannot be completely excluded. The left ventricular ejection fraction was calculated or visually estimated to be 25% with global hypokinesis. High risk study. 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 Mild concentric hypertrophy of the left ventricle. Doppler evidence of grade I (impaired) diastolic dysfunction, elevated LAP. Left ventricle regional wall motion findings: Basal inferoseptal, Mid inferior, Mid inferoseptal hypokinesis. Moderate global hypokinesis. Severely depressed LV systolic function. Calculated EF 23%. Mild (Grade I) mitral regurgitation. Current Plans Started Metoprolol Succinate ER 25MG, 1 (one) Tablet every morning, #30, 10/16/2016, Ref. x2. Started Valsartan 80MG, 1 (one) Tablet at bedtime, #30, 10/16/2016, Ref. x2. 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 Mild concentric hypertrophy of the left ventricle. Doppler evidence of grade I (impaired) diastolic dysfunction, elevated LAP. Left ventricle regional wall motion findings: Basal inferoseptal, Mid inferior, Mid inferoseptal hypokinesis. Moderate global hypokinesis. Severely depressed LV systolic function. Calculated EF 23%. Mild (Grade I) mitral  regurgitation. Impression: EKG 09/11/2013: Normal sinus rhythm at rate of 80 bpm, left atrial abnormality, normal axis. Poor R-wave progression, cannot exclude anteroseptal infarct old. Normal QT interval. No evidence of ischemia. Treadmill stress test Story: Indication: Arrhythmia Resting EKG demonstrates NSR. The patient exercised according to Bruce Protocol, Total time recorded 3:46 min achieving max heart rate of 121 which was 83 % of THR for age and 5.52 METS of work. Stress terminated due to shortness of breath and fatigue. Normal BP response. There was no ST-T changes of ischemia with exercise stress test. There were frequent  PVC, ventricular couplets and multifocal PVC in recovery. Normal BP response. Rec: Suboptimal treadmill stress test due to marked dyspnea at low level exercise and frequent PVC (see above) in recovery. Consider pharmacologic stress or further cardiologic evaluation. Centrilobular emphysema (J43.2) Screening for AAA (abdominal aortic aneurysm) (Z13.6) Story: Abdominal aortic duplex 10/06/2016: Diffuse plaque noted in the proximal, mid and distal aorta. No AAA observed. Tobacco use disorder, continuous (F17.209) Story: 55 pack year history Current Plans Mechanism of underlying disease process and action of medications discussed with the patient. I discussed primary/secondary prevention and also dietary counceling was done. I have reviewed the results of the stress test and echocardiogram with the patient, high risk stress test, his major risk factors include his age and chronic tobacco use disorder. I also cannot exclude nonischemic cardiomyopathy. However due to wall motion of normality, abnormal stress test, he needs left heart catheterization. I'll start him on metoprolol succinate 25 mg in the morning and valsartan 80 mg in the evening for chronic systolic and diastolic heart failure. Smoking cessation again discussed extensively. He is also being followed by  Pulmonary Medicine.  Schedule for cardiac catheterization, and possible angioplasty. We discussed regarding risks, benefits, alternatives to this including stress testing, CTA and continued medical therapy. Patient wants to proceed. Understands <1-2% risk of death, stroke, MI, urgent CABG, bleeding, infection, renal failure but not limited to these. Video recording of the procedure shown to the patient.  CC Dr. Tamsen Roers. CC: Marshell Garfinkel, MD  Addendum Sunday Spillers MD; 11/02/2016 7:15 AM) Labs 10/30/2016: Serum glucose 73, BUN 13, creatinine 1.15, eGFR greater than 60 mL, potassium 4.6. HB 16.2/HCT 46.5, normal indicis, platelets 243. Pro time normal.  Signed by Mario Page, MD (10/16/2016 4:34 PM)

## 2016-11-03 ENCOUNTER — Encounter (HOSPITAL_COMMUNITY): Payer: Self-pay | Admitting: Cardiology

## 2016-11-03 ENCOUNTER — Ambulatory Visit (HOSPITAL_COMMUNITY)
Admission: RE | Admit: 2016-11-03 | Discharge: 2016-11-03 | Disposition: A | Discharge status: Home / Self Care | Payer: Medicare HMO | Source: Ambulatory Visit | Attending: Cardiology | Admitting: Cardiology

## 2016-11-03 ENCOUNTER — Encounter (HOSPITAL_COMMUNITY)
Admission: RE | Disposition: A | Discharge status: Home / Self Care | Payer: Self-pay | Source: Ambulatory Visit | Attending: Cardiology

## 2016-11-03 DIAGNOSIS — I5042 Chronic combined systolic (congestive) and diastolic (congestive) heart failure: Secondary | ICD-10-CM | POA: Insufficient documentation

## 2016-11-03 DIAGNOSIS — R69 Illness, unspecified: Secondary | ICD-10-CM | POA: Diagnosis not present

## 2016-11-03 DIAGNOSIS — J432 Centrilobular emphysema: Secondary | ICD-10-CM | POA: Insufficient documentation

## 2016-11-03 DIAGNOSIS — F17210 Nicotine dependence, cigarettes, uncomplicated: Secondary | ICD-10-CM | POA: Insufficient documentation

## 2016-11-03 DIAGNOSIS — I255 Ischemic cardiomyopathy: Secondary | ICD-10-CM | POA: Diagnosis present

## 2016-11-03 DIAGNOSIS — I447 Left bundle-branch block, unspecified: Secondary | ICD-10-CM | POA: Diagnosis not present

## 2016-11-03 DIAGNOSIS — R0609 Other forms of dyspnea: Secondary | ICD-10-CM | POA: Diagnosis present

## 2016-11-03 DIAGNOSIS — I340 Nonrheumatic mitral (valve) insufficiency: Secondary | ICD-10-CM | POA: Diagnosis not present

## 2016-11-03 DIAGNOSIS — I253 Aneurysm of heart: Secondary | ICD-10-CM | POA: Insufficient documentation

## 2016-11-03 DIAGNOSIS — Z885 Allergy status to narcotic agent status: Secondary | ICD-10-CM | POA: Diagnosis not present

## 2016-11-03 DIAGNOSIS — R0600 Dyspnea, unspecified: Secondary | ICD-10-CM | POA: Diagnosis present

## 2016-11-03 HISTORY — PX: LEFT HEART CATH AND CORONARY ANGIOGRAPHY: CATH118249

## 2016-11-03 SURGERY — LEFT HEART CATH AND CORONARY ANGIOGRAPHY
Anesthesia: LOCAL

## 2016-11-03 MED ORDER — SODIUM CHLORIDE 0.9 % IV SOLN
INTRAVENOUS | Status: DC
  Administered 2016-11-03: 08:00:00 via INTRAVENOUS

## 2016-11-03 MED ORDER — SODIUM CHLORIDE 0.9% FLUSH: 3.0000 mL | Freq: Two times a day (BID) | INTRAVENOUS | Status: DC

## 2016-11-03 MED ORDER — VERAPAMIL HCL 2.5 MG/ML IV SOLN
INTRA_ARTERIAL | Status: DC | PRN
  Administered 2016-11-03: 09:00:00 via INTRA_ARTERIAL

## 2016-11-03 MED ORDER — HEPARIN (PORCINE) IN NACL 2-0.9 UNIT/ML-% IJ SOLN
INTRAMUSCULAR | Status: AC
  Filled 2016-11-03: qty 500

## 2016-11-03 MED ORDER — MIDAZOLAM HCL 2 MG/2ML IJ SOLN
INTRAMUSCULAR | Status: AC
  Filled 2016-11-03: qty 2

## 2016-11-03 MED ORDER — HEPARIN SODIUM (PORCINE) 1000 UNIT/ML IJ SOLN
INTRAMUSCULAR | Status: DC | PRN
  Administered 2016-11-03: 3000 [IU] via INTRAVENOUS

## 2016-11-03 MED ORDER — NITROGLYCERIN 1 MG/10 ML FOR IR/CATH LAB
INTRA_ARTERIAL | Status: AC
  Filled 2016-11-03: qty 10

## 2016-11-03 MED ORDER — VERAPAMIL HCL 2.5 MG/ML IV SOLN
INTRAVENOUS | Status: AC
  Filled 2016-11-03: qty 2

## 2016-11-03 MED ORDER — LIDOCAINE HCL (PF) 1 % IJ SOLN
INTRAMUSCULAR | Status: DC | PRN
  Administered 2016-11-03: 2 mL

## 2016-11-03 MED ORDER — IOPAMIDOL (ISOVUE-370) INJECTION 76%
INTRAVENOUS | Status: AC
  Filled 2016-11-03: qty 100

## 2016-11-03 MED ORDER — HEPARIN (PORCINE) IN NACL 2-0.9 UNIT/ML-% IJ SOLN
INTRAMUSCULAR | Status: AC | PRN
  Administered 2016-11-03: 1000 mL via INTRA_ARTERIAL

## 2016-11-03 MED ORDER — MIDAZOLAM HCL 2 MG/2ML IJ SOLN
INTRAMUSCULAR | Status: DC | PRN
  Administered 2016-11-03: 2 mg via INTRAVENOUS

## 2016-11-03 MED ORDER — ASPIRIN 81 MG PO CHEW
CHEWABLE_TABLET | ORAL | Status: AC
  Filled 2016-11-03: qty 1

## 2016-11-03 MED ORDER — LIDOCAINE HCL 1 % IJ SOLN
INTRAMUSCULAR | Status: AC
  Filled 2016-11-03: qty 20

## 2016-11-03 MED ORDER — SODIUM CHLORIDE 0.9 % IV SOLN: 250.0000 mL | INTRAVENOUS | Status: DC | PRN

## 2016-11-03 MED ORDER — IOPAMIDOL (ISOVUE-370) INJECTION 76%
INTRAVENOUS | Status: DC | PRN
  Administered 2016-11-03: 50 mL via INTRA_ARTERIAL

## 2016-11-03 MED ORDER — SODIUM CHLORIDE 0.9% FLUSH: 3.0000 mL | INTRAVENOUS | Status: DC | PRN

## 2016-11-03 MED ORDER — HYDROMORPHONE HCL 1 MG/ML IJ SOLN
INTRAMUSCULAR | Status: AC
  Filled 2016-11-03: qty 0.5

## 2016-11-03 MED ORDER — HEPARIN SODIUM (PORCINE) 1000 UNIT/ML IJ SOLN
INTRAMUSCULAR | Status: AC
  Filled 2016-11-03: qty 1

## 2016-11-03 MED ORDER — ASPIRIN 81 MG PO CHEW
81.0000 mg | CHEWABLE_TABLET | ORAL | Status: AC
  Administered 2016-11-03: 81 mg via ORAL

## 2016-11-03 MED ORDER — HYDROMORPHONE HCL 1 MG/ML IJ SOLN
INTRAMUSCULAR | Status: DC | PRN
  Administered 2016-11-03: 0.5 mg via INTRAVENOUS

## 2016-11-03 MED ORDER — SODIUM CHLORIDE 0.9 % IV SOLN: INTRAVENOUS | Status: DC

## 2016-11-03 SURGICAL SUPPLY — 11 items
CATH INFINITI 5FR ANG PIGTAIL (CATHETERS) ×2 IMPLANT
CATH OPTITORQUE TIG 4.0 5F (CATHETERS) ×2 IMPLANT
DEVICE RAD COMP TR BAND LRG (VASCULAR PRODUCTS) ×2 IMPLANT
GLIDESHEATH SLEND A-KIT 6F 20G (SHEATH) ×2 IMPLANT
GUIDEWIRE INQWIRE 1.5J.035X260 (WIRE) ×1 IMPLANT
INQWIRE 1.5J .035X260CM (WIRE) ×2
KIT HEART LEFT (KITS) ×2 IMPLANT
PACK CARDIAC CATHETERIZATION (CUSTOM PROCEDURE TRAY) ×2 IMPLANT
SYR MEDRAD MARK V 150ML (SYRINGE) ×2 IMPLANT
TRANSDUCER W/STOPCOCK (MISCELLANEOUS) ×2 IMPLANT
TUBING CIL FLEX 10 FLL-RA (TUBING) ×2 IMPLANT

## 2016-11-03 NOTE — Interval H&P Note (Signed)
History and Physical Interval Note:  11/03/2016 8:58 AM  Mario Proctor  has presented today for surgery, with the diagnosis of sob, cardiomyopathy  The various methods of treatment have been discussed with the patient and family. After consideration of risks, benefits and other options for treatment, the patient has consented to  Procedure(s): Left Heart Cath and Coronary Angiography (N/A) and possible angioplasty as a surgical intervention .  The patient's history has been reviewed, patient examined, no change in status, stable for surgery.  I have reviewed the patient's chart and labs.  Questions were answered to the patient's satisfaction.   Ischemic Symptoms? CCS II (Slight limitation of ordinary activity) Anti-ischemic Medical Therapy? Minimal Therapy (1 class of medications) Non-invasive Test Results? High-risk stress test findings: cardiac mortality >3%/yr Prior CABG? No Previous CABG   Patient Information:   1-2V CAD, no prox LAD  A (7)  Indication: 18; Score: 7   Patient Information:   CTO of 1 vessel, no other CAD  U (5)  Indication: 28; Score: 5   Patient Information:   1V CAD with prox LAD  A (8)  Indication: 34; Score: 8   Patient Information:   2V-CAD with prox LAD  A (8)  Indication: 40; Score: 8   Patient Information:   3V-CAD without LMCA  A (8)  Indication: 46; Score: 8   Patient Information:   3V-CAD without LMCA With Abnormal LV systolic function  A (9)  Indication: 48; Score: 9   Patient Information:   LMCA-CAD  A (9)  Indication: 49; Score: 9   Patient Information:   2V-CAD with prox LAD PCI  A (7)  Indication: 62; Score: 7   Patient Information:   2V-CAD with prox LAD CABG  A (8)  Indication: 62; Score: 8   Patient Information:   3V-CAD without LMCA With Low CAD burden(i.e., 3 focal stenoses, low SYNTAX score) PCI  A (7)  Indication: 63; Score: 7   Patient Information:   3V-CAD without LMCA With Low CAD  burden(i.e., 3 focal stenoses, low SYNTAX score) CABG  A (9)  Indication: 63; Score: 9   Patient Information:   3V-CAD without LMCA E06c - Intermediate-high CAD burden (i.e., multiple diffuse lesions, presence of CTO, or high SYNTAX score) PCI  U (4)  Indication: 64; Score: 4   Patient Information:   3V-CAD without LMCA E06c - Intermediate-high CAD burden (i.e., multiple diffuse lesions, presence of CTO, or high SYNTAX score) CABG  A (9)  Indication: 64; Score: 9   Patient Information:   LMCA-CAD With Isolated LMCA stenosis  PCI  U (6)  Indication: 65; Score: 6   Patient Information:   LMCA-CAD With Isolated LMCA stenosis  CABG  A (9)  Indication: 65; Score: 9   Patient Information:   LMCA-CAD Additional CAD, low CAD burden (i.e., 1- to 2-vessel additional involvement, low SYNTAX score) PCI  U (5)  Indication: 66; Score: 5   Patient Information:   LMCA-CAD Additional CAD, low CAD burden (i.e., 1- to 2-vessel additional involvement, low SYNTAX score) CABG  A (9)  Indication: 66; Score: 9   Patient Information:   LMCA-CAD Additional CAD, intermediate-high CAD burden (i.e., 3-vessel involvement, presence of CTO, or high SYNTAX score) PCI  I (3)  Indication: 67; Score: 3   Patient Information:   LMCA-CAD Additional CAD, intermediate-high CAD burden (i.e., 3-vessel involvement, presence of CTO, or high SYNTAX score) CABG  A (9)  Indication: 67; Score: 9  Hamsini Verrilli

## 2016-11-03 NOTE — Discharge Instructions (Signed)

## 2016-11-13 DIAGNOSIS — I493 Ventricular premature depolarization: Secondary | ICD-10-CM | POA: Diagnosis not present

## 2016-11-13 DIAGNOSIS — I428 Other cardiomyopathies: Secondary | ICD-10-CM | POA: Diagnosis not present

## 2016-11-13 DIAGNOSIS — R0602 Shortness of breath: Secondary | ICD-10-CM | POA: Diagnosis not present

## 2016-11-13 DIAGNOSIS — I5042 Chronic combined systolic (congestive) and diastolic (congestive) heart failure: Secondary | ICD-10-CM | POA: Diagnosis not present

## 2016-11-25 DIAGNOSIS — I509 Heart failure, unspecified: Secondary | ICD-10-CM | POA: Diagnosis not present

## 2016-11-25 DIAGNOSIS — Z0000 Encounter for general adult medical examination without abnormal findings: Secondary | ICD-10-CM | POA: Diagnosis not present

## 2016-11-25 DIAGNOSIS — R69 Illness, unspecified: Secondary | ICD-10-CM | POA: Diagnosis not present

## 2016-11-25 DIAGNOSIS — M19039 Primary osteoarthritis, unspecified wrist: Secondary | ICD-10-CM | POA: Diagnosis not present

## 2016-11-25 DIAGNOSIS — R0602 Shortness of breath: Secondary | ICD-10-CM | POA: Diagnosis not present

## 2016-11-25 DIAGNOSIS — Z974 Presence of external hearing-aid: Secondary | ICD-10-CM | POA: Diagnosis not present

## 2016-11-25 DIAGNOSIS — H9113 Presbycusis, bilateral: Secondary | ICD-10-CM | POA: Diagnosis not present

## 2016-11-25 DIAGNOSIS — I110 Hypertensive heart disease with heart failure: Secondary | ICD-10-CM | POA: Diagnosis not present

## 2016-11-25 DIAGNOSIS — Z6822 Body mass index (BMI) 22.0-22.9, adult: Secondary | ICD-10-CM | POA: Diagnosis not present

## 2016-11-26 DIAGNOSIS — J330 Polyp of nasal cavity: Secondary | ICD-10-CM | POA: Diagnosis not present

## 2016-11-26 DIAGNOSIS — H7292 Unspecified perforation of tympanic membrane, left ear: Secondary | ICD-10-CM | POA: Diagnosis not present

## 2016-11-26 DIAGNOSIS — J342 Deviated nasal septum: Secondary | ICD-10-CM | POA: Diagnosis not present

## 2016-11-27 DIAGNOSIS — I5042 Chronic combined systolic (congestive) and diastolic (congestive) heart failure: Secondary | ICD-10-CM | POA: Diagnosis not present

## 2016-11-27 DIAGNOSIS — R0602 Shortness of breath: Secondary | ICD-10-CM | POA: Diagnosis not present

## 2016-11-27 DIAGNOSIS — R42 Dizziness and giddiness: Secondary | ICD-10-CM | POA: Diagnosis not present

## 2016-11-27 DIAGNOSIS — I428 Other cardiomyopathies: Secondary | ICD-10-CM | POA: Diagnosis not present

## 2016-12-07 DIAGNOSIS — R0602 Shortness of breath: Secondary | ICD-10-CM | POA: Diagnosis not present

## 2016-12-14 DIAGNOSIS — R69 Illness, unspecified: Secondary | ICD-10-CM | POA: Diagnosis not present

## 2016-12-14 DIAGNOSIS — I5042 Chronic combined systolic (congestive) and diastolic (congestive) heart failure: Secondary | ICD-10-CM | POA: Diagnosis not present

## 2016-12-14 DIAGNOSIS — I428 Other cardiomyopathies: Secondary | ICD-10-CM | POA: Diagnosis not present

## 2016-12-14 DIAGNOSIS — R42 Dizziness and giddiness: Secondary | ICD-10-CM | POA: Diagnosis not present

## 2017-01-07 ENCOUNTER — Ambulatory Visit (INDEPENDENT_AMBULATORY_CARE_PROVIDER_SITE_OTHER): Payer: Medicare HMO | Admitting: Pulmonary Disease

## 2017-01-07 ENCOUNTER — Encounter: Payer: Self-pay | Admitting: Pulmonary Disease

## 2017-01-07 VITALS — BP 122/60 | HR 70 | Ht 71.0 in | Wt 152.0 lb

## 2017-01-07 DIAGNOSIS — I428 Other cardiomyopathies: Secondary | ICD-10-CM | POA: Diagnosis not present

## 2017-01-07 DIAGNOSIS — J449 Chronic obstructive pulmonary disease, unspecified: Secondary | ICD-10-CM | POA: Diagnosis not present

## 2017-01-07 DIAGNOSIS — R0602 Shortness of breath: Secondary | ICD-10-CM

## 2017-01-07 LAB — PULMONARY FUNCTION TEST
DL/VA % pred: 55 %
DL/VA: 2.57 ml/min/mmHg/L
DLCO COR: 17.71 ml/min/mmHg
DLCO UNC % PRED: 46 %
DLCO cor % pred: 52 %
DLCO unc: 15.81 ml/min/mmHg
FEF 25-75 PRE: 0.72 L/s
FEF 25-75 Post: 0.96 L/sec
FEF2575-%Change-Post: 33 %
FEF2575-%PRED-PRE: 31 %
FEF2575-%Pred-Post: 41 %
FEV1-%Change-Post: 6 %
FEV1-%PRED-PRE: 54 %
FEV1-%Pred-Post: 58 %
FEV1-POST: 1.86 L
FEV1-Pre: 1.74 L
FEV1FVC-%CHANGE-POST: -1 %
FEV1FVC-%Pred-Pre: 65 %
FEV6-%CHANGE-POST: 8 %
FEV6-%Pred-Post: 90 %
FEV6-%Pred-Pre: 83 %
FEV6-PRE: 3.46 L
FEV6-Post: 3.75 L
FEV6FVC-%Change-Post: 0 %
FEV6FVC-%Pred-Post: 100 %
FEV6FVC-%Pred-Pre: 101 %
FVC-%Change-Post: 9 %
FVC-%PRED-POST: 89 %
FVC-%Pred-Pre: 82 %
FVC-POST: 3.96 L
FVC-PRE: 3.63 L
POST FEV1/FVC RATIO: 47 %
PRE FEV1/FVC RATIO: 48 %
Post FEV6/FVC ratio: 95 %
Pre FEV6/FVC Ratio: 95 %
RV % pred: 152 %
RV: 3.98 L
TLC % PRED: 113 %
TLC: 8.24 L

## 2017-01-07 NOTE — Patient Instructions (Signed)
Continue using your albuterol rescue inhaler as needed Your oxygen levels were okay when we exercised you I encourage you to quit smoking Follow-up with Dr. Nadyne Coombes  Return to clinic in 6 months

## 2017-01-07 NOTE — Progress Notes (Addendum)
Mario Proctor    [id number]    11/09/1940  Primary Care Physician:Little, Jeneen Rinks, MD  Referring Physician: Marshell Garfinkel, MD [place], [place] [id number]  Chief complaint:  Follow up for COPD GOLD A  HPI:  Mr. Mario Proctor is a 76 year old with active smoking history, allergies, COPD GOLD A (CAT score 7, no exacerbations). He has symptoms of dyspnea on exertion for the past several years. He gets short of breath with excessive physical activity but is able to carry out his normal daily activities. He is able to climb a flight of stairs, walk to his mailbox. 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 He has chronic cough with minimal sputum production. No wheezing, hemoptysis, fevers, chills. He had been on albuterol many years ago and is currently not using any inhaler medication.   He was evaluated by Dr. Einar Proctor for frequent PVCs. He was unable to complete the treadmill stress test due to marked dyspnea and PVCs. An echocardiogram and a pharmacologic stress test has been done. Cardiac cath shows nonischemic cardiomyopathy.   Pets: Cats, dog. No birds, exotic pets, farm animals Occupation: Worked as a Geophysicist/field seismologist for [employer]. Exposures: Has some exposure to lead in his line of work. Denies exposure to asbestos, dust, etc., Smoking history: 54-pack-year smoking history. Continues to smoke 1 pack per day  Interim History: He continues to feel well,  hardly needs to use albuterol rescue inhaler and is not in any controller medication. He continues to smoke with no intentions of quitting.  Outpatient Encounter Prescriptions as of 01/07/2017  Medication Sig  . albuterol (PROVENTIL HFA;VENTOLIN HFA) 108 (90 Base) MCG/ACT inhaler Inhale 2 puffs into the lungs every 6 (six) hours as needed for wheezing or shortness of breath.  . metoprolol succinate (TOPROL-XL) 25 MG 24 hr tablet Take 25 mg by mouth daily.  . valsartan (DIOVAN) 80 MG tablet Take 80  mg by mouth at bedtime.  Marland Kitchen ENTRESTO 97-103 MG Take 1 tablet by mouth 2 (two) times daily.   No facility-administered encounter medications on file as of 01/07/2017.     Allergies as of 01/07/2017 - Review Complete 01/07/2017  Allergen Reaction Noted  . Oxycodone Rash 04/26/2015    Past Medical History:  Diagnosis Date  . Allergy   . Dupuytren contracture    right sm finger    Past Surgical History:  Procedure Laterality Date  . COLON SURGERY     bowel blockage  . DUPUYTREN CONTRACTURE RELEASE Left 2011  . FASCIECTOMY Right 05/02/2015   Procedure: FASCIECTOMY RIGHT SMALL FINGER;  Surgeon: Mario Brod, MD;  Location: [place];  Service: Orthopedics;  Laterality: Right;  axillary block in preop  . LEFT HEART CATH AND CORONARY ANGIOGRAPHY N/A 11/03/2016   Procedure: Left Heart Cath and Coronary Angiography;  Surgeon: Mario Prows, MD;  Location: [place] [facility];  Service: Cardiovascular;  Laterality: N/A;  . TONSILLECTOMY    . TYMPANOPLASTY Right     Family History  Problem Relation Age of Onset  . Cancer Brother        "in heart and lungs"     Social History   Social History  . Marital status: Married    Spouse name: N/A  . Number of children: N/A  . Years of education: N/A   Occupational History  . Not on file.   Social History Main Topics  . Smoking status:  Current Every Day Smoker    Packs/day: 0.50    Years: 54.00    Types: Cigarettes  . Smokeless tobacco: Never Used  . Alcohol use Yes     Comment: social  . Drug use: No  . Sexual activity: Not on file   Other Topics Concern  . Not on file   Social History Narrative  . No narrative on file    Review of systems: Review of Systems  Constitutional: Negative for fever and chills.  HENT: Negative.   Eyes: Negative for blurred vision.  Respiratory: as per HPI  Cardiovascular: Negative for chest pain and palpitations.  Gastrointestinal: Negative for vomiting, diarrhea, blood per  rectum. Genitourinary: Negative for dysuria, urgency, frequency and hematuria.  Musculoskeletal: Negative for myalgias, back pain and joint pain.  Skin: Negative for itching and rash.  Neurological: Negative for dizziness, tremors, focal weakness, seizures and loss of consciousness.  Endo/Heme/Allergies: Negative for environmental allergies.  Psychiatric/Behavioral: Negative for depression, suicidal ideas and hallucinations.  All other systems reviewed and are negative.  Physical Exam: Blood pressure 122/60, pulse 70, height 5\' 11"  (1.803 m), weight 152 lb (68.9 kg), SpO2 96 %. Gen:      No acute distress HEENT:  EOMI, sclera anicteric Neck:     No masses; no thyromegaly Lungs:    Clear to auscultation bilaterally; normal respiratory effort CV:         Regular rate and rhythm; no murmurs Abd:      + bowel sounds; soft, non-tender; no palpable masses, no distension Ext:    No edema; adequate peripheral perfusion Skin:      Warm and dry; no rash Neuro: alert and oriented x 3 Psych: normal mood and affect  Data Reviewed: Treadmill stress stress 07/24/16 Suboptimal treadmill stress test due to marked dyspnea, frequent PVC  PFTs 01/07/17 FVC 3.96 [89%), FEV1 1.86 [58%), F/F 47, TLC 113%, RV/TLC 130%, DLCO 46% Moderate obstruction with diffusion impairment, air trapping  Chest x-ray 10/15/16-hyperinflation, mild scoliosis and lordosis. I have reviewed all images personally.  Cardiac cath 11/03/16 Findings are consistent with nonischemic dilated cardiomyopathy (EF30-35%), however with inferior akinesis, patient probably had coronary spasm leading to myocardial infarction or probably prior occlusion which is revascularized spontaneously. Continued aggressive risk modification and therapy for severe LV systolic dysfunction is indicated  Assessment:  Follow up for COPD GOLD A, chronic bronchitis Likely has emphysema from heavy smoking.  He continues on albuterol rescue inhaler. He is  surprisingly asymptomatic and does not need any long-term controller medication for now. We will continue to monitor his symptoms He was ambulated today with no desaturations  Active smoker Continues to smoke 1 pack per day. He has tried nicotine in the past without success. He wants to quit on his own. Time spent counseling-5 Minutes  Plan/Recommendations: - Albuterol inhaler PRN  - Smoking cessation.   Mario Garfinkel MD [facility] Pager [phone] 01/07/2017, 2:23 PM  CC: Mario Garfinkel, MD

## 2017-01-07 NOTE — Progress Notes (Signed)
PFT done today. 

## 2017-01-25 DIAGNOSIS — I428 Other cardiomyopathies: Secondary | ICD-10-CM | POA: Diagnosis not present

## 2017-01-25 DIAGNOSIS — J418 Mixed simple and mucopurulent chronic bronchitis: Secondary | ICD-10-CM | POA: Diagnosis not present

## 2017-01-25 DIAGNOSIS — I5042 Chronic combined systolic (congestive) and diastolic (congestive) heart failure: Secondary | ICD-10-CM | POA: Diagnosis not present

## 2017-01-25 DIAGNOSIS — R69 Illness, unspecified: Secondary | ICD-10-CM | POA: Diagnosis not present

## 2017-02-15 DIAGNOSIS — I509 Heart failure, unspecified: Secondary | ICD-10-CM | POA: Diagnosis not present

## 2017-02-15 DIAGNOSIS — R69 Illness, unspecified: Secondary | ICD-10-CM | POA: Diagnosis not present

## 2017-02-15 DIAGNOSIS — J45909 Unspecified asthma, uncomplicated: Secondary | ICD-10-CM | POA: Diagnosis not present

## 2017-02-15 DIAGNOSIS — J441 Chronic obstructive pulmonary disease with (acute) exacerbation: Secondary | ICD-10-CM | POA: Diagnosis not present

## 2017-02-16 DIAGNOSIS — R69 Illness, unspecified: Secondary | ICD-10-CM | POA: Diagnosis not present

## 2017-03-09 DIAGNOSIS — M25512 Pain in left shoulder: Secondary | ICD-10-CM | POA: Diagnosis not present

## 2017-03-09 DIAGNOSIS — M19012 Primary osteoarthritis, left shoulder: Secondary | ICD-10-CM | POA: Diagnosis not present

## 2017-04-05 DIAGNOSIS — R69 Illness, unspecified: Secondary | ICD-10-CM | POA: Diagnosis not present

## 2017-04-16 DIAGNOSIS — R69 Illness, unspecified: Secondary | ICD-10-CM | POA: Diagnosis not present

## 2017-05-01 DIAGNOSIS — R69 Illness, unspecified: Secondary | ICD-10-CM | POA: Diagnosis not present

## 2017-05-10 ENCOUNTER — Other Ambulatory Visit: Payer: Self-pay

## 2017-05-10 MED ORDER — ALBUTEROL SULFATE HFA 108 (90 BASE) MCG/ACT IN AERS: 2.0000 | INHALATION_SPRAY | Freq: Four times a day (QID) | RESPIRATORY_TRACT | 2 refills | Status: DC | PRN

## 2017-05-11 DIAGNOSIS — I219 Acute myocardial infarction, unspecified: Secondary | ICD-10-CM

## 2017-05-11 HISTORY — DX: Acute myocardial infarction, unspecified: I21.9

## 2017-05-12 DIAGNOSIS — R69 Illness, unspecified: Secondary | ICD-10-CM | POA: Diagnosis not present

## 2017-06-16 DIAGNOSIS — R69 Illness, unspecified: Secondary | ICD-10-CM | POA: Diagnosis not present

## 2017-06-18 DIAGNOSIS — R69 Illness, unspecified: Secondary | ICD-10-CM | POA: Diagnosis not present

## 2017-07-06 ENCOUNTER — Ambulatory Visit: Payer: Medicare HMO | Admitting: Pulmonary Disease

## 2017-07-06 ENCOUNTER — Encounter: Payer: Self-pay | Admitting: Pulmonary Disease

## 2017-07-06 ENCOUNTER — Telehealth: Payer: Self-pay

## 2017-07-06 VITALS — BP 132/80 | HR 76 | Ht 71.0 in | Wt 154.6 lb

## 2017-07-06 DIAGNOSIS — J449 Chronic obstructive pulmonary disease, unspecified: Secondary | ICD-10-CM

## 2017-07-06 MED ORDER — TIOTROPIUM BROMIDE-OLODATEROL 2.5-2.5 MCG/ACT IN AERS: 2.0000 | INHALATION_SPRAY | Freq: Every day | RESPIRATORY_TRACT | 6 refills | Status: DC

## 2017-07-06 NOTE — Patient Instructions (Signed)
We will start you on stiolto inhaler Continue albuterol nebulizers as needed Continue working on smoking cessation Follow-up in 3 months.

## 2017-07-06 NOTE — Telephone Encounter (Signed)
Try bevespi 

## 2017-07-06 NOTE — Telephone Encounter (Signed)
Received alternative request from [facility] for Stiolto.  Covered alternatives are Anoro or Bevespi.  Dr. Vaughan Browner please advise. Thanks

## 2017-07-06 NOTE — Progress Notes (Addendum)
SABASTION HRDLICKA    [id number]    09-01-1940  Primary Care Physician:Little, Jeneen Rinks, MD  Referring Physician: Tamsen Roers, [place], [place] [address]  Chief complaint:  Follow up for COPD GOLD A  HPI:  Mr. Mario Proctor is a 77 year old with active smoking history, allergies, COPD GOLD A (CAT score 9, no exacerbations). He has symptoms of dyspnea on exertion for the past several years. He gets short of breath with excessive physical activity but is able to carry out his normal daily activities. He is able to climb a flight of stairs, walk to his mailbox. 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 He has chronic cough with minimal sputum production. No wheezing, hemoptysis, fevers, chills. He had been on albuterol many years ago and is currently not using any inhaler medication.   He was evaluated by Dr. Einar Gip for frequent PVCs. He was unable to complete the treadmill stress test due to marked dyspnea and PVCs. An echocardiogram and a pharmacologic stress test has been done. Cardiac cath shows nonischemic cardiomyopathy.   Pets: Cats, dog. No birds, exotic pets, farm animals Occupation: Worked as a Geophysicist/field seismologist for [employer]. Exposures: Has some exposure to lead in his line of work. Denies exposure to asbestos, dust, etc., Smoking history: 54-pack-year smoking history. Continues to smoke 1 pack per day  Interim History: Reports cough with congestion, dyspnea on exertion with occasional wheezing.  Continues on albuterol inhaler which he is now using every day.  He was started on nebulizer recently by his primary care physician.  Outpatient Encounter Medications as of 07/06/2017  Medication Sig  . albuterol (PROVENTIL HFA;VENTOLIN HFA) 108 (90 Base) MCG/ACT inhaler Inhale 2 puffs into the lungs every 6 (six) hours as needed for wheezing or shortness of breath.  Marland Kitchen ENTRESTO 97-103 MG Take 1 tablet by mouth 2 (two) times daily.  . metoprolol succinate  (TOPROL-XL) 25 MG 24 hr tablet Take 25 mg by mouth daily.  . valsartan (DIOVAN) 80 MG tablet Take 80 mg by mouth at bedtime.   No facility-administered encounter medications on file as of 07/06/2017.     Allergies as of 07/06/2017 - Review Complete 07/06/2017  Allergen Reaction Noted  . Oxycodone Rash 04/26/2015    Past Medical History:  Diagnosis Date  . Allergy   . Dupuytren contracture    right sm finger  . Myocardial infarction [facility])     Past Surgical History:  Procedure Laterality Date  . COLON SURGERY     bowel blockage  . DUPUYTREN CONTRACTURE RELEASE Left 2011  . FASCIECTOMY Right 05/02/2015   Procedure: FASCIECTOMY RIGHT SMALL FINGER;  Surgeon: Daryll Brod, MD;  Location: [place];  Service: Orthopedics;  Laterality: Right;  axillary block in preop  . LEFT HEART CATH AND CORONARY ANGIOGRAPHY N/A 11/03/2016   Procedure: Left Heart Cath and Coronary Angiography;  Surgeon: [name], MD;  Location: [place] [facility];  Service: Cardiovascular;  Laterality: N/A;  . TONSILLECTOMY    . TYMPANOPLASTY Right     Family History  Problem Relation Age of Onset  . Cancer Brother        "in heart and lungs"     Social History   Socioeconomic History  . Marital status: Married    Spouse name: Not on file  . Number of children: Not on file  . Years of education: Not on file  . Highest  education level: Not on file  Social Needs  . Financial resource strain: Not on file  . Food insecurity - worry: Not on file  . Food insecurity - inability: Not on file  . Transportation needs - medical: Not on file  . Transportation needs - non-medical: Not on file  Occupational History  . Not on file  Tobacco Use  . Smoking status: Current Every Day Smoker    Packs/day: 0.50    Years: 54.00    Pack years: 27.00    Types: Cigarettes  . Smokeless tobacco: Never Used  Substance and Sexual Activity  . Alcohol use: Yes    Comment: social  . Drug use: No  . Sexual  activity: Not on file  Other Topics Concern  . Not on file  Social History Narrative  . Not on file    Review of systems: Review of Systems  Constitutional: Negative for fever and chills.  HENT: Negative.   Eyes: Negative for blurred vision.  Respiratory: as per HPI  Cardiovascular: Negative for chest pain and palpitations.  Gastrointestinal: Negative for vomiting, diarrhea, blood per rectum. Genitourinary: Negative for dysuria, urgency, frequency and hematuria.  Musculoskeletal: Negative for myalgias, back pain and joint pain.  Skin: Negative for itching and rash.  Neurological: Negative for dizziness, tremors, focal weakness, seizures and loss of consciousness.  Endo/Heme/Allergies: Negative for environmental allergies.  Psychiatric/Behavioral: Negative for depression, suicidal ideas and hallucinations.  All other systems reviewed and are negative.  Physical Exam: Blood pressure 122/60, pulse 70, height 5\' 11"  (1.803 m), weight 152 lb (68.9 kg), SpO2 96 %. Gen:      No acute distress HEENT:  EOMI, sclera anicteric Neck:     No masses; no thyromegaly Lungs:    Expiratory wheeze, crackle CV:         Regular rate and rhythm; no murmurs Abd:      + bowel sounds; soft, non-tender; no palpable masses, no distension Ext:    No edema; adequate peripheral perfusion Skin:      Warm and dry; no rash Neuro: alert and oriented x 3 Psych: normal mood and affect  Data Reviewed: Treadmill stress stress 07/24/16 Suboptimal treadmill stress test due to marked dyspnea, frequent PVC  PFTs 01/07/17 FVC 3.96 [89%), FEV1 1.86 [58%), F/F 47, TLC 113%, RV/TLC 130%, DLCO 46% Moderate obstruction with diffusion impairment, air trapping  Chest x-ray 10/15/16-hyperinflation, mild scoliosis and lordosis. I have reviewed all images personally.  Cardiac cath 11/03/16 Findings are consistent with nonischemic dilated cardiomyopathy (EF30-35%), however with inferior akinesis, patient probably had coronary  spasm leading to myocardial infarction or probably prior occlusion which is revascularized spontaneously. Continued aggressive risk modification and therapy for severe LV systolic dysfunction is indicated  Assessment:  Follow up for COPD GOLD A, chronic bronchitis More symptomatic today with wheezing.  We will start him on stiolto inhaler I do not believe he has asthma symptoms as are not typical.  We will get labs from his primary care to assess peripheral eosinophilia Continue on albuterol rescue inhaler. He was ambulated today with no desaturations  Active smoker Continues to smoke 1 pack per day. He has tried nicotine in the past without success. He wants to quit on his own. Time spent counseling-5 Minutes  Plan/Recommendations: - Albuterol inhaler PRN, start stiolto - Smoking cessation.   Marshell Garfinkel MD [facility] 07/06/2017, 9:13 AM  CC: Tamsen Roers, MD  Addendum: Reviewed labs from primary care dated 09/18/16 WBC 7.2, eos  2%, absolute eosinophil count 749 Metabolic panel, LFTs within normal limits.  Marshell Garfinkel MD [facility] If no answer or after 3pm call: [phone] 08/19/2017, 11:08 AM

## 2017-07-08 MED ORDER — GLYCOPYRROLATE-FORMOTEROL 9-4.8 MCG/ACT IN AERO: 2.0000 | INHALATION_SPRAY | Freq: Two times a day (BID) | RESPIRATORY_TRACT | 6 refills | Status: DC

## 2017-07-08 NOTE — Telephone Encounter (Signed)
Pt is aware of below message and voiced his understanding. Rx for [name] has ben sent to preferred pharmacy. Nothing further is needed.

## 2017-07-26 ENCOUNTER — Other Ambulatory Visit: Payer: Self-pay | Admitting: Pulmonary Disease

## 2017-07-26 DIAGNOSIS — R0602 Shortness of breath: Secondary | ICD-10-CM | POA: Diagnosis not present

## 2017-07-28 DIAGNOSIS — I5042 Chronic combined systolic (congestive) and diastolic (congestive) heart failure: Secondary | ICD-10-CM | POA: Diagnosis not present

## 2017-07-28 DIAGNOSIS — J418 Mixed simple and mucopurulent chronic bronchitis: Secondary | ICD-10-CM | POA: Diagnosis not present

## 2017-07-28 DIAGNOSIS — I428 Other cardiomyopathies: Secondary | ICD-10-CM | POA: Diagnosis not present

## 2017-07-28 DIAGNOSIS — R69 Illness, unspecified: Secondary | ICD-10-CM | POA: Diagnosis not present

## 2017-08-26 DIAGNOSIS — I428 Other cardiomyopathies: Secondary | ICD-10-CM | POA: Diagnosis not present

## 2017-08-31 DIAGNOSIS — R69 Illness, unspecified: Secondary | ICD-10-CM | POA: Diagnosis not present

## 2017-09-03 DIAGNOSIS — I5042 Chronic combined systolic (congestive) and diastolic (congestive) heart failure: Secondary | ICD-10-CM | POA: Diagnosis not present

## 2017-09-03 DIAGNOSIS — R69 Illness, unspecified: Secondary | ICD-10-CM | POA: Diagnosis not present

## 2017-09-03 DIAGNOSIS — I428 Other cardiomyopathies: Secondary | ICD-10-CM | POA: Diagnosis not present

## 2017-09-03 DIAGNOSIS — I493 Ventricular premature depolarization: Secondary | ICD-10-CM | POA: Diagnosis not present

## 2017-09-09 DIAGNOSIS — I4891 Unspecified atrial fibrillation: Secondary | ICD-10-CM | POA: Diagnosis not present

## 2017-09-20 DIAGNOSIS — I4891 Unspecified atrial fibrillation: Secondary | ICD-10-CM | POA: Diagnosis not present

## 2017-09-20 DIAGNOSIS — I5042 Chronic combined systolic (congestive) and diastolic (congestive) heart failure: Secondary | ICD-10-CM | POA: Diagnosis not present

## 2017-09-20 DIAGNOSIS — I428 Other cardiomyopathies: Secondary | ICD-10-CM | POA: Diagnosis not present

## 2017-09-20 DIAGNOSIS — I472 Ventricular tachycardia: Secondary | ICD-10-CM | POA: Diagnosis not present

## 2017-09-23 DIAGNOSIS — I4891 Unspecified atrial fibrillation: Secondary | ICD-10-CM | POA: Diagnosis not present

## 2017-10-05 ENCOUNTER — Encounter: Payer: Self-pay | Admitting: Pulmonary Disease

## 2017-10-05 ENCOUNTER — Ambulatory Visit: Payer: Medicare HMO | Admitting: Pulmonary Disease

## 2017-10-05 VITALS — BP 138/70 | HR 68 | Ht 71.0 in | Wt 162.0 lb

## 2017-10-05 DIAGNOSIS — J449 Chronic obstructive pulmonary disease, unspecified: Secondary | ICD-10-CM

## 2017-10-05 MED ORDER — GLYCOPYRROLATE-FORMOTEROL 9-4.8 MCG/ACT IN AERO: 2.0000 | INHALATION_SPRAY | Freq: Two times a day (BID) | RESPIRATORY_TRACT | 3 refills | Status: DC

## 2017-10-05 NOTE — Progress Notes (Signed)
Mario Proctor    [id number]    [birth year]  Primary Care Physician:Little, Jeneen Rinks, MD  Referring Physician: Tamsen Roers, [place], [place] [address]  Chief complaint:  Follow up for COPD GOLD A  HPI:  [name] is a 77 year old with active smoking history, allergies, COPD GOLD A (CAT score 9, no exacerbations). He has symptoms of dyspnea on exertion for the past several years. He gets short of breath with excessive physical activity but is able to carry out his normal daily activities. He is able to climb a flight of stairs, walk to his mailbox. 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 He has chronic cough with minimal sputum production. No wheezing, hemoptysis, fevers, chills. He had been on albuterol many years ago and is currently not using any inhaler medication.   He was evaluated by Dr. Einar Gip for frequent PVCs. He was unable to complete the treadmill stress test due to marked dyspnea and PVCs. An echocardiogram and a pharmacologic stress test has been done. Cardiac cath shows nonischemic cardiomyopathy.   Pets: Cats, dog. No birds, exotic pets, farm animals Occupation: Worked as a Geophysicist/field seismologist for [employer]. Exposures: Has some exposure to lead in his line of work. Denies exposure to asbestos, dust, etc., Smoking history: 54-pack-year smoking history. Continues to smoke 1 pack per day  Interim History: Given bevespi inhaler at last visit.  He is not using this regularly.   States that breathing is doing okay.  Has dyspnea on exertion at baseline Recently started on Xarelto by Dr. Einar Gip for new onset atrial fibrillation.  Outpatient Encounter Medications as of 10/05/2017  Medication Sig  . ENTRESTO 97-103 MG Take 1 tablet by mouth 2 (two) times daily.  . Glycopyrrolate-Formoterol (BEVESPI AEROSPHERE) 9-4.8 MCG/ACT AERO Inhale 2 puffs into the lungs 2 (two) times daily.  . metoprolol succinate (TOPROL-XL) 25 MG 24 hr tablet Take 25 mg by  mouth daily.  . valsartan (DIOVAN) 80 MG tablet Take 80 mg by mouth at bedtime.  . VENTOLIN HFA 108 (90 Base) MCG/ACT inhaler TAKE 2 PUFFS BY MOUTH EVERY 6 HOURS AS NEEDED FOR WHEEZE OR SHORTNESS OF BREATH   No facility-administered encounter medications on file as of 10/05/2017.     Allergies as of 10/05/2017 - Review Complete 10/05/2017  Allergen Reaction Noted  . Codeine Itching 10/20/2016  . Oxycodone Rash 04/26/2015    Past Medical History:  Diagnosis Date  . Allergy   . Dupuytren contracture    right sm finger  . Myocardial infarction [facility])     Past Surgical History:  Procedure Laterality Date  . COLON SURGERY     bowel blockage  . DUPUYTREN CONTRACTURE RELEASE Left 2011  . FASCIECTOMY Right 05/02/2015   Procedure: FASCIECTOMY RIGHT SMALL FINGER;  Surgeon: Daryll Brod, MD;  Location: [place];  Service: Orthopedics;  Laterality: Right;  axillary block in preop  . LEFT HEART CATH AND CORONARY ANGIOGRAPHY N/A 11/03/2016   Procedure: Left Heart Cath and Coronary Angiography;  Surgeon: [name], MD;  Location: [place] [facility];  Service: Cardiovascular;  Laterality: N/A;  . TONSILLECTOMY    . TYMPANOPLASTY Right     Family History  Problem Relation Age of Onset  . Cancer Brother        "in heart and lungs"     Social History   Socioeconomic History  . Marital status: Married  Spouse name: Not on file  . Number of children: Not on file  . Years of education: Not on file  . Highest education level: Not on file  Occupational History  . Not on file  Social Needs  . Financial resource strain: Not on file  . Food insecurity:    Worry: Not on file    Inability: Not on file  . Transportation needs:    Medical: Not on file    Non-medical: Not on file  Tobacco Use  . Smoking status: Current Every Day Smoker    Packs/day: 0.50    Years: 54.00    Pack years: 27.00    Types: Cigarettes  . Smokeless tobacco: Never Used  Substance and Sexual  Activity  . Alcohol use: Yes    Comment: social  . Drug use: No  . Sexual activity: Not on file  Lifestyle  . Physical activity:    Days per week: Not on file    Minutes per session: Not on file  . Stress: Not on file  Relationships  . Social connections:    Talks on phone: Not on file    Gets together: Not on file    Attends religious service: Not on file    Active member of club or organization: Not on file    Attends meetings of clubs or organizations: Not on file    Relationship status: Not on file  . Intimate partner violence:    Fear of current or ex partner: Not on file    Emotionally abused: Not on file    Physically abused: Not on file    Forced sexual activity: Not on file  Other Topics Concern  . Not on file  Social History Narrative  . Not on file    Review of systems: Review of Systems  Constitutional: Negative for fever and chills.  HENT: Negative.   Eyes: Negative for blurred vision.  Respiratory: as per HPI  Cardiovascular: Negative for chest pain and palpitations.  Gastrointestinal: Negative for vomiting, diarrhea, blood per rectum. Genitourinary: Negative for dysuria, urgency, frequency and hematuria.  Musculoskeletal: Negative for myalgias, back pain and joint pain.  Skin: Negative for itching and rash.  Neurological: Negative for dizziness, tremors, focal weakness, seizures and loss of consciousness.  Endo/Heme/Allergies: Negative for environmental allergies.  Psychiatric/Behavioral: Negative for depression, suicidal ideas and hallucinations.  All other systems reviewed and are negative.  Physical Exam: Blood pressure 138/70, pulse 68, height 5\' 11"  (1.803 m), weight 162 lb (73.5 kg), SpO2 96 %. Gen:      No acute distress HEENT:  EOMI, sclera anicteric Neck:     No masses; no thyromegaly Lungs:    Clear to auscultation bilaterally; normal respiratory effort CV:         Regular rate and rhythm; no murmurs Abd:      + bowel sounds; soft,  non-tender; no palpable masses, no distension Ext:    No edema; adequate peripheral perfusion Skin:      Warm and dry; no rash Neuro: alert and oriented x 3 Psych: normal mood and affect  Data Reviewed: Treadmill stress stress 07/24/16 Suboptimal treadmill stress test due to marked dyspnea, frequent PVC  PFTs 01/07/17 FVC 3.96 [89%), FEV1 1.86 [58%), F/F 47, TLC 113%, RV/TLC 130%, DLCO 46% Moderate obstruction with diffusion impairment, air trapping  Chest x-ray 10/15/16-hyperinflation, mild scoliosis and lordosis. I have reviewed all images personally.  Cardiac cath 11/03/16 Findings are consistent with nonischemic dilated cardiomyopathy (EF30-35%), however with  inferior akinesis, patient probably had coronary spasm leading to myocardial infarction or probably prior occlusion which is revascularized spontaneously. Continued aggressive risk modification and therapy for severe LV systolic dysfunction is indicated  Reviewed labs from primary care dated 09/18/16 WBC 7.2, eos 2%, absolute eosinophil count 628 Metabolic panel, LFTs within normal limits.  Assessment:  Follow up for COPD GOLD A, chronic bronchitis He is not using his bevespi regularly.  I have asked him to start using it twice daily for optimal effect Continue albuterol rescue inhaler No desaturations on ambulation.  Active smoker Continues to smoke 1 pack/day.  He has tried nicotine in the past without success.  He wants to quit on his own.  Health maintenance States that he is up-to-date with vaccines at his primary care  Plan/Recommendations: - Continue bevespi, albuterol as needed - Smoking cessation.  Marshell Garfinkel MD [facility] 10/05/2017, 10:00 AM  CC: Tamsen Roers, MD

## 2017-10-05 NOTE — Patient Instructions (Signed)
Make sure you use the bevespi regularly twice every day for improvement in your breathing. Continue to work on smoking cessation Follow-up in 6 months.

## 2017-10-20 ENCOUNTER — Other Ambulatory Visit: Payer: Self-pay | Admitting: Pulmonary Disease

## 2017-10-25 DIAGNOSIS — R69 Illness, unspecified: Secondary | ICD-10-CM | POA: Diagnosis not present

## 2017-10-30 DIAGNOSIS — Z7901 Long term (current) use of anticoagulants: Secondary | ICD-10-CM | POA: Diagnosis not present

## 2017-10-30 DIAGNOSIS — J449 Chronic obstructive pulmonary disease, unspecified: Secondary | ICD-10-CM | POA: Diagnosis not present

## 2017-10-30 DIAGNOSIS — M1612 Unilateral primary osteoarthritis, left hip: Secondary | ICD-10-CM | POA: Diagnosis not present

## 2017-10-30 DIAGNOSIS — M545 Low back pain: Secondary | ICD-10-CM | POA: Diagnosis not present

## 2017-10-30 DIAGNOSIS — M25552 Pain in left hip: Secondary | ICD-10-CM | POA: Diagnosis not present

## 2017-10-30 DIAGNOSIS — S7012XA Contusion of left thigh, initial encounter: Secondary | ICD-10-CM | POA: Diagnosis not present

## 2017-10-30 DIAGNOSIS — S8992XA Unspecified injury of left lower leg, initial encounter: Secondary | ICD-10-CM | POA: Diagnosis not present

## 2017-10-30 DIAGNOSIS — W010XXA Fall on same level from slipping, tripping and stumbling without subsequent striking against object, initial encounter: Secondary | ICD-10-CM | POA: Diagnosis not present

## 2017-10-30 DIAGNOSIS — T148XXA Other injury of unspecified body region, initial encounter: Secondary | ICD-10-CM | POA: Diagnosis not present

## 2017-10-30 DIAGNOSIS — I480 Paroxysmal atrial fibrillation: Secondary | ICD-10-CM | POA: Diagnosis not present

## 2017-10-30 DIAGNOSIS — Z743 Need for continuous supervision: Secondary | ICD-10-CM | POA: Diagnosis not present

## 2017-10-30 DIAGNOSIS — I10 Essential (primary) hypertension: Secondary | ICD-10-CM | POA: Diagnosis not present

## 2017-10-30 DIAGNOSIS — I4891 Unspecified atrial fibrillation: Secondary | ICD-10-CM | POA: Diagnosis not present

## 2017-10-30 DIAGNOSIS — S79922A Unspecified injury of left thigh, initial encounter: Secondary | ICD-10-CM | POA: Diagnosis not present

## 2017-10-30 DIAGNOSIS — S3992XA Unspecified injury of lower back, initial encounter: Secondary | ICD-10-CM | POA: Diagnosis not present

## 2017-10-30 DIAGNOSIS — S36899A Unspecified injury of other intra-abdominal organs, initial encounter: Secondary | ICD-10-CM | POA: Diagnosis not present

## 2017-10-30 DIAGNOSIS — I2510 Atherosclerotic heart disease of native coronary artery without angina pectoris: Secondary | ICD-10-CM | POA: Diagnosis not present

## 2017-10-30 DIAGNOSIS — W19XXXA Unspecified fall, initial encounter: Secondary | ICD-10-CM | POA: Diagnosis not present

## 2017-10-30 DIAGNOSIS — S3993XA Unspecified injury of pelvis, initial encounter: Secondary | ICD-10-CM | POA: Diagnosis not present

## 2017-10-30 DIAGNOSIS — R58 Hemorrhage, not elsewhere classified: Secondary | ICD-10-CM | POA: Diagnosis not present

## 2017-10-30 DIAGNOSIS — M25551 Pain in right hip: Secondary | ICD-10-CM | POA: Diagnosis not present

## 2017-10-30 DIAGNOSIS — M5136 Other intervertebral disc degeneration, lumbar region: Secondary | ICD-10-CM | POA: Diagnosis not present

## 2017-10-30 DIAGNOSIS — S301XXA Contusion of abdominal wall, initial encounter: Secondary | ICD-10-CM | POA: Diagnosis not present

## 2017-11-01 DIAGNOSIS — Z7901 Long term (current) use of anticoagulants: Secondary | ICD-10-CM | POA: Diagnosis not present

## 2017-11-01 DIAGNOSIS — S7012XA Contusion of left thigh, initial encounter: Secondary | ICD-10-CM | POA: Diagnosis not present

## 2017-11-06 DIAGNOSIS — R69 Illness, unspecified: Secondary | ICD-10-CM | POA: Diagnosis not present

## 2017-11-08 DIAGNOSIS — I5042 Chronic combined systolic (congestive) and diastolic (congestive) heart failure: Secondary | ICD-10-CM | POA: Diagnosis not present

## 2017-11-08 DIAGNOSIS — I428 Other cardiomyopathies: Secondary | ICD-10-CM | POA: Diagnosis not present

## 2017-11-08 DIAGNOSIS — R69 Illness, unspecified: Secondary | ICD-10-CM | POA: Diagnosis not present

## 2017-11-08 DIAGNOSIS — I481 Persistent atrial fibrillation: Secondary | ICD-10-CM | POA: Diagnosis not present

## 2017-11-23 DIAGNOSIS — H25013 Cortical age-related cataract, bilateral: Secondary | ICD-10-CM | POA: Diagnosis not present

## 2017-11-23 DIAGNOSIS — H524 Presbyopia: Secondary | ICD-10-CM | POA: Diagnosis not present

## 2017-11-23 DIAGNOSIS — I481 Persistent atrial fibrillation: Secondary | ICD-10-CM | POA: Diagnosis not present

## 2017-11-23 DIAGNOSIS — Z961 Presence of intraocular lens: Secondary | ICD-10-CM | POA: Diagnosis not present

## 2017-11-23 DIAGNOSIS — H2513 Age-related nuclear cataract, bilateral: Secondary | ICD-10-CM | POA: Diagnosis not present

## 2017-11-27 DIAGNOSIS — Z0100 Encounter for examination of eyes and vision without abnormal findings: Secondary | ICD-10-CM | POA: Diagnosis not present

## 2017-11-30 DIAGNOSIS — T148XXA Other injury of unspecified body region, initial encounter: Secondary | ICD-10-CM | POA: Diagnosis not present

## 2017-11-30 DIAGNOSIS — D649 Anemia, unspecified: Secondary | ICD-10-CM | POA: Diagnosis not present

## 2017-12-13 DIAGNOSIS — I428 Other cardiomyopathies: Secondary | ICD-10-CM | POA: Diagnosis not present

## 2017-12-13 DIAGNOSIS — I5042 Chronic combined systolic (congestive) and diastolic (congestive) heart failure: Secondary | ICD-10-CM | POA: Diagnosis not present

## 2017-12-13 DIAGNOSIS — I481 Persistent atrial fibrillation: Secondary | ICD-10-CM | POA: Diagnosis not present

## 2017-12-13 DIAGNOSIS — M545 Low back pain: Secondary | ICD-10-CM | POA: Diagnosis not present

## 2017-12-13 DIAGNOSIS — R69 Illness, unspecified: Secondary | ICD-10-CM | POA: Diagnosis not present

## 2017-12-27 DIAGNOSIS — M545 Low back pain: Secondary | ICD-10-CM | POA: Diagnosis not present

## 2018-01-04 DIAGNOSIS — R69 Illness, unspecified: Secondary | ICD-10-CM | POA: Diagnosis not present

## 2018-02-01 DIAGNOSIS — R69 Illness, unspecified: Secondary | ICD-10-CM | POA: Diagnosis not present

## 2018-02-08 DIAGNOSIS — I428 Other cardiomyopathies: Secondary | ICD-10-CM | POA: Diagnosis not present

## 2018-02-14 DIAGNOSIS — J441 Chronic obstructive pulmonary disease with (acute) exacerbation: Secondary | ICD-10-CM | POA: Diagnosis not present

## 2018-02-14 DIAGNOSIS — R69 Illness, unspecified: Secondary | ICD-10-CM | POA: Diagnosis not present

## 2018-02-14 DIAGNOSIS — I509 Heart failure, unspecified: Secondary | ICD-10-CM | POA: Diagnosis not present

## 2018-03-08 ENCOUNTER — Other Ambulatory Visit: Payer: Self-pay | Admitting: Pulmonary Disease

## 2018-03-09 DIAGNOSIS — R69 Illness, unspecified: Secondary | ICD-10-CM | POA: Diagnosis not present

## 2018-03-09 DIAGNOSIS — I5042 Chronic combined systolic (congestive) and diastolic (congestive) heart failure: Secondary | ICD-10-CM | POA: Diagnosis not present

## 2018-03-09 DIAGNOSIS — I428 Other cardiomyopathies: Secondary | ICD-10-CM | POA: Diagnosis not present

## 2018-03-09 DIAGNOSIS — I4819 Other persistent atrial fibrillation: Secondary | ICD-10-CM | POA: Diagnosis not present

## 2018-03-25 DIAGNOSIS — R69 Illness, unspecified: Secondary | ICD-10-CM | POA: Diagnosis not present

## 2018-04-12 ENCOUNTER — Ambulatory Visit: Payer: Medicare HMO | Admitting: Pulmonary Disease

## 2018-04-12 ENCOUNTER — Other Ambulatory Visit (INDEPENDENT_AMBULATORY_CARE_PROVIDER_SITE_OTHER): Payer: Medicare HMO

## 2018-04-12 ENCOUNTER — Encounter: Payer: Self-pay | Admitting: Pulmonary Disease

## 2018-04-12 ENCOUNTER — Ambulatory Visit (INDEPENDENT_AMBULATORY_CARE_PROVIDER_SITE_OTHER)
Admission: RE | Admit: 2018-04-12 | Discharge: 2018-04-12 | Disposition: A | Discharge status: Home / Self Care | Payer: Medicare HMO | Source: Ambulatory Visit | Attending: Pulmonary Disease | Admitting: Pulmonary Disease

## 2018-04-12 VITALS — BP 132/80 | HR 65 | Ht 71.0 in | Wt 159.4 lb

## 2018-04-12 DIAGNOSIS — J449 Chronic obstructive pulmonary disease, unspecified: Secondary | ICD-10-CM

## 2018-04-12 LAB — CBC WITH DIFFERENTIAL/PLATELET
BASOS ABS: 0.1 10*3/uL (ref 0.0–0.1)
BASOS PCT: 1 % (ref 0.0–3.0)
EOS PCT: 5.5 % — AB (ref 0.0–5.0)
Eosinophils Absolute: 0.4 10*3/uL (ref 0.0–0.7)
HCT: 46.8 % (ref 39.0–52.0)
Hemoglobin: 16.1 g/dL (ref 13.0–17.0)
LYMPHS ABS: 1.9 10*3/uL (ref 0.7–4.0)
LYMPHS PCT: 28 % (ref 12.0–46.0)
MCHC: 34.4 g/dL (ref 30.0–36.0)
MCV: 95.1 fl (ref 78.0–100.0)
Monocytes Absolute: 0.7 10*3/uL (ref 0.1–1.0)
Monocytes Relative: 10.6 % (ref 3.0–12.0)
NEUTROS ABS: 3.7 10*3/uL (ref 1.4–7.7)
NEUTROS PCT: 54.9 % (ref 43.0–77.0)
Platelets: 263 10*3/uL (ref 150.0–400.0)
RBC: 4.92 Mil/uL (ref 4.22–5.81)
RDW: 13.7 % (ref 11.5–15.5)
WBC: 6.7 10*3/uL (ref 4.0–10.5)

## 2018-04-12 MED ORDER — FLUTICASONE-UMECLIDIN-VILANT 100-62.5-25 MCG/INH IN AEPB: 1.0000 | INHALATION_SPRAY | Freq: Every day | RESPIRATORY_TRACT | 5 refills | Status: AC

## 2018-04-12 NOTE — Progress Notes (Addendum)
Mario Proctor    [id number]    [birth year]  Primary Care Physician:Proctor, Mario Rinks, MD  Referring Physician: Tamsen Proctor, Mario Proctor [place],  [address]  Chief complaint:  Follow up for COPD GOLD A  HPI:  Mario Proctor is a 77 year old with active smoking history, allergies, COPD GOLD A (CAT score 9, no exacerbations), atrial fibrillation.  He has symptoms of dyspnea on exertion for the past several years. He gets short of breath with excessive physical activity but is able to carry out his normal daily activities. He is able to climb a flight of stairs, walk to his mailbox. 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 He has chronic cough with minimal sputum production. No wheezing, hemoptysis, fevers, chills. He had been on albuterol many years ago and is currently not using any inhaler medication.   He was evaluated by Dr. Einar Gip for frequent PVCs. He was unable to complete the treadmill stress test due to marked dyspnea and PVCs. An echocardiogram and a pharmacologic stress test has been done. Cardiac cath shows nonischemic cardiomyopathy. Started on Xarelto by Dr. Einar Gip for new onset atrial fibrillation.  Pets: Cats, dog. No birds, exotic pets, farm animals Occupation: Worked as a Geophysicist/field seismologist for [employer]. Exposures: Has some exposure to lead in his line of work. Denies exposure to asbestos, dust, etc., Smoking history: 54-pack-year smoking history. Continues to smoke 1 pack per day  Interim History: Continues on Bevespi inhaler.  He is not using it on a regular basis due to the cost of medication Has dyspnea on exertion with occasional wheeze.  This is associated with chronic cough with white mucus.  No fevers, chills.  Outpatient Encounter Medications as of 04/12/2018  Medication Sig  . ENTRESTO 97-103 MG Take 1 tablet by mouth 2 (two) times daily.  . Glycopyrrolate-Formoterol (BEVESPI AEROSPHERE) 9-4.8 MCG/ACT AERO Inhale 2 puffs into the lungs 2  (two) times daily.  . metoprolol succinate (TOPROL-XL) 25 MG 24 hr tablet Take 25 mg by mouth daily.  . rivaroxaban (XARELTO) 20 MG TABS tablet Take 20 mg by mouth daily with supper.  . valsartan (DIOVAN) 80 MG tablet Take 80 mg by mouth at bedtime.  . VENTOLIN HFA 108 (90 Base) MCG/ACT inhaler TAKE 2 PUFFS BY MOUTH EVERY 6 HOURS AS NEEDED FOR WHEEZE OR SHORTNESS OF BREATH   No facility-administered encounter medications on file as of 04/12/2018.    Physical Exam: Blood pressure 132/80, pulse 65, height 5\' 11"  (1.803 m), weight 159 lb 6.4 oz (72.3 kg), SpO2 95 %. Gen:      No acute distress HEENT:  EOMI, sclera anicteric Neck:     No masses; no thyromegaly Lungs:    Clear to auscultation bilaterally; normal respiratory effort CV:         Regular rate and rhythm; no murmurs Abd:      + bowel sounds; soft, non-tender; no palpable masses, no distension Ext:    No edema; adequate peripheral perfusion Skin:      Warm and dry; no rash Neuro: alert and oriented x 3 Psych: normal mood and affect  Data Reviewed: Imaging Chest x-ray 10/15/16-hyperinflation, mild scoliosis and lordosis. I have reviewed all images personally  PFTs 01/07/17 FVC 3.96 [89%), FEV1 1.86 [58%), F/F 47, TLC 113%, RV/TLC 130%, DLCO 46% Moderate obstruction with diffusion impairment, air trapping  Cardiac Cardiac cath 11/03/16 Findings are consistent with nonischemic dilated cardiomyopathy (EF30-35%),  however with inferior akinesis, patient probably had coronary spasm leading to myocardial infarction or probably prior occlusion which is revascularized spontaneously. Continued aggressive risk modification and therapy for severe LV systolic dysfunction is indicated  Treadmill stress stress 07/24/16 Suboptimal treadmill stress test due to marked dyspnea, frequent PVC  Labs Reviewed labs from primary care dated 09/18/16 WBC 7.2, eos 2%, absolute eosinophil count 924 Metabolic panel, LFTs within normal limits.  Assessment:    Follow up for COPD GOLD A, chronic bronchitis He is not using his bevespi regularly due to cost. We will check CBC with differential to check peripheral eosinophilia levels and try Trelegy inhaler. Chest x-ray today. Continue albuterol rescue inhaler No desaturations on ambulation.  Active smoker Continues to smoke 1 pack/day.  He has tried nicotine in the past without success.  He wants to quit on his own.  Health maintenance 04/04/89-influenza 6/12-Pneumovax.  Plan/Recommendations: - Stop Bevespi, start Trelegy. albuterol as needed - CBC, chest x-ray  - Smoking cessation.  Mario Garfinkel MD [facility] 04/12/2018, 9:04 AM  CC: Mario Roers, MD

## 2018-04-12 NOTE — Patient Instructions (Signed)
We will check a CBC and a chest x-ray today We will check with insurance to see if Trelegy inhaler will work a cheaper than the current one We will check oxygen levels on exertion  Continue to work on smoking cessation Follow-up in 6 months.

## 2018-06-09 DIAGNOSIS — N529 Male erectile dysfunction, unspecified: Secondary | ICD-10-CM | POA: Diagnosis not present

## 2018-06-09 DIAGNOSIS — I509 Heart failure, unspecified: Secondary | ICD-10-CM | POA: Diagnosis not present

## 2018-06-09 DIAGNOSIS — Z7901 Long term (current) use of anticoagulants: Secondary | ICD-10-CM | POA: Diagnosis not present

## 2018-06-09 DIAGNOSIS — Z7951 Long term (current) use of inhaled steroids: Secondary | ICD-10-CM | POA: Diagnosis not present

## 2018-06-09 DIAGNOSIS — Z9181 History of falling: Secondary | ICD-10-CM | POA: Diagnosis not present

## 2018-06-09 DIAGNOSIS — J449 Chronic obstructive pulmonary disease, unspecified: Secondary | ICD-10-CM | POA: Diagnosis not present

## 2018-06-09 DIAGNOSIS — I110 Hypertensive heart disease with heart failure: Secondary | ICD-10-CM | POA: Diagnosis not present

## 2018-06-09 DIAGNOSIS — I252 Old myocardial infarction: Secondary | ICD-10-CM | POA: Diagnosis not present

## 2018-06-09 DIAGNOSIS — I4891 Unspecified atrial fibrillation: Secondary | ICD-10-CM | POA: Diagnosis not present

## 2018-06-09 DIAGNOSIS — R69 Illness, unspecified: Secondary | ICD-10-CM | POA: Diagnosis not present

## 2018-06-28 DIAGNOSIS — R69 Illness, unspecified: Secondary | ICD-10-CM | POA: Diagnosis not present

## 2018-06-29 DIAGNOSIS — R69 Illness, unspecified: Secondary | ICD-10-CM | POA: Diagnosis not present

## 2018-07-11 DIAGNOSIS — R69 Illness, unspecified: Secondary | ICD-10-CM | POA: Diagnosis not present

## 2018-07-26 ENCOUNTER — Other Ambulatory Visit: Payer: Self-pay | Admitting: Pulmonary Disease

## 2018-09-01 ENCOUNTER — Other Ambulatory Visit: Payer: Self-pay | Admitting: Cardiology

## 2018-09-01 DIAGNOSIS — I472 Ventricular tachycardia: Secondary | ICD-10-CM | POA: Diagnosis not present

## 2018-09-01 DIAGNOSIS — I4819 Other persistent atrial fibrillation: Secondary | ICD-10-CM | POA: Diagnosis not present

## 2018-09-01 DIAGNOSIS — I480 Paroxysmal atrial fibrillation: Secondary | ICD-10-CM | POA: Diagnosis not present

## 2018-09-01 DIAGNOSIS — Z0189 Encounter for other specified special examinations: Secondary | ICD-10-CM | POA: Diagnosis not present

## 2018-09-01 DIAGNOSIS — I5042 Chronic combined systolic (congestive) and diastolic (congestive) heart failure: Secondary | ICD-10-CM | POA: Diagnosis not present

## 2018-09-02 LAB — CBC WITH DIFFERENTIAL/PLATELET
Basophils Absolute: 0.1 10*3/uL (ref 0.0–0.2)
Basos: 1 %
EOS (ABSOLUTE): 0.6 10*3/uL — ABNORMAL HIGH (ref 0.0–0.4)
Eos: 9 %
Hematocrit: 45.9 % (ref 37.5–51.0)
Hemoglobin: 15.6 g/dL (ref 13.0–17.7)
Immature Grans (Abs): 0 10*3/uL (ref 0.0–0.1)
Immature Granulocytes: 0 %
Lymphocytes Absolute: 2 10*3/uL (ref 0.7–3.1)
Lymphs: 30 %
MCH: 31.1 pg (ref 26.6–33.0)
MCHC: 34 g/dL (ref 31.5–35.7)
MCV: 92 fL (ref 79–97)
Monocytes Absolute: 0.7 10*3/uL (ref 0.1–0.9)
Monocytes: 10 %
Neutrophils Absolute: 3.4 10*3/uL (ref 1.4–7.0)
Neutrophils: 50 %
Platelets: 241 10*3/uL (ref 150–450)
RBC: 5.01 x10E6/uL (ref 4.14–5.80)
RDW: 12 % (ref 11.6–15.4)
WBC: 6.7 10*3/uL (ref 3.4–10.8)

## 2018-09-02 LAB — COMPREHENSIVE METABOLIC PANEL
ALT: 12 IU/L (ref 0–44)
AST: 19 IU/L (ref 0–40)
Albumin/Globulin Ratio: 1.4 (ref 1.2–2.2)
Albumin: 3.9 g/dL (ref 3.7–4.7)
Alkaline Phosphatase: 87 IU/L (ref 39–117)
BUN/Creatinine Ratio: 13 (ref 10–24)
BUN: 15 mg/dL (ref 8–27)
Bilirubin Total: 0.6 mg/dL (ref 0.0–1.2)
CO2: 26 mmol/L (ref 20–29)
Calcium: 9 mg/dL (ref 8.6–10.2)
Chloride: 99 mmol/L (ref 96–106)
Creatinine, Ser: 1.13 mg/dL (ref 0.76–1.27)
GFR calc Af Amer: 73 mL/min/{1.73_m2} (ref >59)
GFR calc non Af Amer: 63 mL/min/{1.73_m2} (ref >59)
Globulin, Total: 2.8 g/dL (ref 1.5–4.5)
Glucose: 90 mg/dL (ref 65–99)
Potassium: 4.8 mmol/L (ref 3.5–5.2)
Sodium: 140 mmol/L (ref 134–144)
Total Protein: 6.7 g/dL (ref 6.0–8.5)

## 2018-09-02 LAB — LIPID PANEL W/O CHOL/HDL RATIO
Cholesterol, Total: 140 mg/dL (ref 100–199)
HDL: 62 mg/dL (ref >39)
LDL Calculated: 67 mg/dL (ref 0–99)
Triglycerides: 54 mg/dL (ref 0–149)
VLDL Cholesterol Cal: 11 mg/dL (ref 5–40)

## 2018-09-02 LAB — TSH: TSH: 1.99 u[IU]/mL (ref 0.450–4.500)

## 2018-09-05 ENCOUNTER — Encounter: Payer: Self-pay | Admitting: Cardiology

## 2018-09-07 ENCOUNTER — Ambulatory Visit (INDEPENDENT_AMBULATORY_CARE_PROVIDER_SITE_OTHER): Payer: Medicare HMO | Admitting: Cardiology

## 2018-09-07 ENCOUNTER — Encounter: Payer: Self-pay | Admitting: Cardiology

## 2018-09-07 ENCOUNTER — Other Ambulatory Visit: Payer: Self-pay

## 2018-09-07 VITALS — BP 141/71 | Ht 71.0 in | Wt 160.0 lb

## 2018-09-07 DIAGNOSIS — I5022 Chronic systolic (congestive) heart failure: Secondary | ICD-10-CM | POA: Insufficient documentation

## 2018-09-07 DIAGNOSIS — I428 Other cardiomyopathies: Secondary | ICD-10-CM

## 2018-09-07 DIAGNOSIS — R69 Illness, unspecified: Secondary | ICD-10-CM | POA: Diagnosis not present

## 2018-09-07 DIAGNOSIS — I482 Chronic atrial fibrillation, unspecified: Secondary | ICD-10-CM | POA: Diagnosis not present

## 2018-09-07 DIAGNOSIS — F17200 Nicotine dependence, unspecified, uncomplicated: Secondary | ICD-10-CM

## 2018-09-07 DIAGNOSIS — I5042 Chronic combined systolic (congestive) and diastolic (congestive) heart failure: Secondary | ICD-10-CM

## 2018-09-07 MED ORDER — ENTRESTO 97-103 MG PO TABS: 1.0000 | ORAL_TABLET | Freq: Every day | ORAL | 6 refills | Status: DC

## 2018-09-07 MED ORDER — RIVAROXABAN 20 MG PO TABS: 20.0000 mg | ORAL_TABLET | Freq: Every day | ORAL | 6 refills | Status: DC

## 2018-09-07 NOTE — Progress Notes (Signed)
Primary Physician/Referring:  Tamsen Roers, MD  Patient ID: Mario Proctor, male    DOB: 1940/11/22, 78 y.o.   MRN: [id number]  Chief Complaint  Patient presents with   Atrial Fibrillation   Follow-up    56mo   This visit type was conducted due to national recommendations for restrictions regarding the COVID-19 Pandemic (e.g. social distancing).  This format is felt to be most appropriate for this patient at this time.  All issues noted in this document were discussed and addressed.  No physical exam was performed (except for noted visual exam findings with Telehealth visits).  The patient has consented to conduct a Telehealth visit and understands insurance will be billed.   I discussed the limitations of evaluation and management by telemedicine and the availability of in person appointments. The patient expressed understanding and agreed to proceed.  Virtual Visit via Video Note is as below  I connected with Mario Proctor, on 09/07/18 at 0900 by telephone application and verified that I am speaking with the correct person using two identifiers. Unable to perform video visit due to patient did not have the equipment.     I have discussed with her regarding the safety during COVID Pandemic and steps and precautions including social distancing with the patient.    HPI: Mario Proctor  is a 78 y.o. male  with nonischemic cardiomyopathy by coronary angiogram on 11/02/2016 revealing ejection fraction of 30-35%, ongoning tobacco use disorder, and A fib incidentally found on holter monitor as well as occasional couplets and 2 episodes of NSVT. He is asymptomatic in regards to his A fib and is now on anticoagulation.   Patient has chronic dyspnea on exertion that is stable. He has been on aggressive medical therapy for CHF including beta blockers and Entresto. Last echocardiogram in Oct 2019 remained stable with continued LVEF of 35%. He without any new complaints today. Tolerating medications  well.  Continues to smoke 1/2 pack per day.    Past Medical History:  Diagnosis Date   Allergy    Dupuytren contracture    right sm finger   Myocardial infarction [facility])     Past Surgical History:  Procedure Laterality Date   COLON SURGERY     bowel blockage   DUPUYTREN CONTRACTURE RELEASE Left 2011   FASCIECTOMY Right 05/02/2015   Procedure: FASCIECTOMY RIGHT SMALL FINGER;  Surgeon: Daryll Brod, MD;  Location: [facility];  Service: Orthopedics;  Laterality: Right;  axillary block in preop   LEFT HEART CATH AND CORONARY ANGIOGRAPHY N/A 11/03/2016   Procedure: Left Heart Cath and Coronary Angiography;  Surgeon: [name], MD;  Location: [facility];  Service: Cardiovascular;  Laterality: N/A;   TONSILLECTOMY     TYMPANOPLASTY Right     Social History   Socioeconomic History   Marital status: Married    Spouse name: Not on file   Number of children: 3   Years of education: Not on file   Highest education level: Not on file  Occupational History   Not on file  Social Needs   Financial resource strain: Not on file   Food insecurity:    Worry: Not on file    Inability: Not on file   Transportation needs:    Medical: Not on file    Non-medical: Not on file  Tobacco Use   Smoking status: Current Every Day Smoker    Packs/day: 0.50    Years: 54.00    Pack years: 27.00  Types: Cigarettes   Smokeless tobacco: Never Used  Substance and Sexual Activity   Alcohol use: Yes    Comment: social   Drug use: No   Sexual activity: Not on file  Lifestyle   Physical activity:    Days per week: Not on file    Minutes per session: Not on file   Stress: Not on file  Relationships   Social connections:    Talks on phone: Not on file    Gets together: Not on file    Attends religious service: Not on file    Active member of club or organization: Not on file    Attends meetings of clubs or organizations: Not on file     Relationship status: Not on file   Intimate partner violence:    Fear of current or ex partner: Not on file    Emotionally abused: Not on file    Physically abused: Not on file    Forced sexual activity: Not on file  Other Topics Concern   Not on file  Social History Narrative   Not on file    Current Outpatient Medications on File Prior to Visit  Medication Sig Dispense Refill   albuterol (ACCUNEB) 1.25 MG/3ML nebulizer solution as needed.     albuterol (PROVENTIL HFA;VENTOLIN HFA) 108 (90 Base) MCG/ACT inhaler TAKE 2 PUFFS BY MOUTH EVERY 6 HOURS AS NEEDED FOR WHEEZE OR SHORTNESS OF BREATH 18 Inhaler 5   albuterol (VENTOLIN HFA) 108 (90 Base) MCG/ACT inhaler as needed.     budesonide (PULMICORT) 0.5 MG/2ML nebulizer solution as needed.     ENTRESTO 97-103 MG Take 1 tablet by mouth daily.  1   Glycopyrrolate-Formoterol (BEVESPI AEROSPHERE) 9-4.8 MCG/ACT AERO as needed.     metoprolol succinate (TOPROL-XL) 25 MG 24 hr tablet Take 25 mg by mouth daily.  2   rivaroxaban (XARELTO) 20 MG TABS tablet Take 20 mg by mouth daily with supper.     TRELEGY ELLIPTA 100-62.5-25 MCG/INH AEPB as needed.     No current facility-administered medications on file prior to visit.     Review of Systems  Constitution: Negative for decreased appetite, malaise/fatigue, weight gain and weight loss.  Eyes: Negative for visual disturbance.  Cardiovascular: Positive for dyspnea on exertion (chronic). Negative for chest pain, claudication, leg swelling, orthopnea, palpitations and syncope.  Respiratory: Positive for cough (chronic). Negative for hemoptysis and wheezing.   Endocrine: Negative for cold intolerance and heat intolerance.  Hematologic/Lymphatic: Does not bruise/bleed easily.  Skin: Negative for nail changes.  Musculoskeletal: Negative for muscle weakness and myalgias.  Gastrointestinal: Negative for abdominal pain, change in bowel habit, nausea and vomiting.  Neurological: Negative for  difficulty with concentration, dizziness, focal weakness and headaches.  Psychiatric/Behavioral: Negative for altered mental status and suicidal ideas.  All other systems reviewed and are negative.     Objective  Blood pressure (!) 141/71, height 5\' 11"  (1.803 m), weight 160 lb (72.6 kg). Body mass index is 22.32 kg/m.    *Physical exam not performed as this is a telephone visit*   Radiology: No results found.  Laboratory examination:    CMP Latest Ref Rng & Units 09/01/2018  Glucose 65 - 99 mg/dL 90  BUN 8 - 27 mg/dL 15  Creatinine 0.76 - 1.27 mg/dL 1.13  Sodium 134 - 144 mmol/L 140  Potassium 3.5 - 5.2 mmol/L 4.8  Chloride 96 - 106 mmol/L 99  CO2 20 - 29 mmol/L 26  Calcium 8.6 - 10.2 mg/dL 9.0  Total Protein 6.0 - 8.5 g/dL 6.7  Total Bilirubin 0.0 - 1.2 mg/dL 0.6  Alkaline Phos 39 - 117 IU/L 87  AST 0 - 40 IU/L 19  ALT 0 - 44 IU/L 12   CBC Latest Ref Rng & Units 09/01/2018 04/12/2018 04/07/2010  WBC 3.4 - 10.8 x10E3/uL 6.7 6.7 -  Hemoglobin 13.0 - 17.7 g/dL 15.6 16.1 16.9  Hematocrit 37.5 - 51.0 % 45.9 46.8 -  Platelets 150 - 450 x10E3/uL 241 263.0 -   Lipid Panel     Component Value Date/Time   CHOL 140 09/01/2018 0803   TRIG 54 09/01/2018 0803   HDL 62 09/01/2018 0803   LDLCALC 67 09/01/2018 0803   HEMOGLOBIN A1C No results found for: HGBA1C, MPG TSH Recent Labs    09/01/18 0803  TSH 1.990    Cardiac Studies:   Echocardiogram 02/08/2018: Left ventricle cavity is mildly dilated. Mild concentric hypertrophy of the left ventricle. Moderate decrease in global wall motion. Doppler evidence of grade I (impaired) diastolic dysfunction, normal LAP. Calculated EF 37%. Mild (Grade I) mitral regurgitation. Mild tricuspid regurgitation. Estimated pulmonary artery systolic pressure 35 mmHg. IVC is dilated with blunted respiratory response. Estimated RA pressure 10-15 mmHg. No significant change compared to prior study on 08/26/2017  Holter Monitor 48 hours  09/07/2017: Minimum heart rate 38 bpm at 12:13 AM maximum heart rate 154 bpm at 12:59 PM. PVCs consisted of 3600 beats, 1.5% burden. Occasional ventricular and triplets, 194 couplets.. Bigeminy and trigeminy. 2 episodes of 4 beat 3 beat NSVT. Predominant rhythm was atrial fibrillation.  Coronary angiogram 11/03/2016: Severe left ventricular systolic dysfunction. The left ventricular ejection fraction is 30-35% by visual estimate with inferior wall akinesis.There is no mitral valve regurgitation. Normal LVEDP. Normal coronary arteries.  Lexiscan myoview stress test 09/18/2016: 1. The resting electrocardiogram demonstrated normal sinus rhythm, normal resting conduction, no resting arrhythmias and normal rest repolarization. Stress EKG is non-diagnostic for ischemia as it a pharmacologic stress using Lexiscan. Stress symptoms included dyspnea. Occasional PVC noted. 2. The LV is dilated both at rest and stress images. The LV end diastolic volume was 782UM. SPECT images demonstrate Medium perfusion abnormality of moderate intensity in the basal inferior, mid inferior and apical inferior myocardial wall(s) on the stress images. The defect remains relatively unchanged between rest and stress images and is a soft tissue attenuation artifact, however scar in this region without ischemia cannot be completely excluded. The left ventricular ejection fraction was calculated or visually estimated to be 25% with global hypokinesis. High risk study.  Abdominal aortic duplex 10/06/2016: Diffuse plaque noted in the proximal, mid and distal aorta. No AAA observed.   Assessment   Nonischemic cardiomyopathy (HCC)  Chronic combined systolic and diastolic CHF (congestive heart failure) (HCC)  Chronic atrial fibrillation  Tobacco use disorder   CHA2DS2-VASCScore: Risk Score 4,  Yearly risk of stroke  4.0%. Recommendation: Anticoagulation   EKG 03/09/2018: Atrial fibrillation with controlled ventricular response  at the rate of 77 bpm, anteroseptal infarct old. No evidence of ischemia. Normal QT interval.    Recommendations:   Patient is presently doing well without any new complaints. Tolerating medications well. He takes Entresto once a day due to nausea with twice a day dose, will continue with this. LVEF has remained stable by last Echo in Oct 2019. Has chronic dyspnea on exertion that is unchanged and likely multifactoral from COPD, CHF, and tobacco use disorder. Remains asymptomatic from A fib. Tolerating anticoagulation well without any bleeding diathesis. CBC has  remained stable. Recent labs are stable. No changes were made to medications today. Unfortunately, he continues to smoke. He is not interested in medications and continues to prefer to quit on his own. Will continue to stress importance of smoking cessation. Will see him back in 6 months or sooner if problems.  Miquel Dunn, MSN, APRN, FNP-C [facility]. [place] Office: [phone] Fax: [phone]

## 2018-09-13 ENCOUNTER — Other Ambulatory Visit: Payer: Self-pay | Admitting: Cardiology

## 2018-09-13 MED ORDER — METOPROLOL SUCCINATE ER 25 MG PO TB24: 25.0000 mg | ORAL_TABLET | Freq: Every day | ORAL | 2 refills | Status: DC

## 2018-09-26 DIAGNOSIS — R69 Illness, unspecified: Secondary | ICD-10-CM | POA: Diagnosis not present

## 2018-09-26 DIAGNOSIS — H00012 Hordeolum externum right lower eyelid: Secondary | ICD-10-CM | POA: Diagnosis not present

## 2018-09-26 DIAGNOSIS — H5711 Ocular pain, right eye: Secondary | ICD-10-CM | POA: Diagnosis not present

## 2018-11-11 DIAGNOSIS — R69 Illness, unspecified: Secondary | ICD-10-CM | POA: Diagnosis not present

## 2019-01-11 ENCOUNTER — Other Ambulatory Visit: Payer: Self-pay | Admitting: Pulmonary Disease

## 2019-02-02 ENCOUNTER — Other Ambulatory Visit: Payer: Self-pay

## 2019-02-08 DIAGNOSIS — R69 Illness, unspecified: Secondary | ICD-10-CM | POA: Diagnosis not present

## 2019-02-12 DIAGNOSIS — R69 Illness, unspecified: Secondary | ICD-10-CM | POA: Diagnosis not present

## 2019-02-23 DIAGNOSIS — R69 Illness, unspecified: Secondary | ICD-10-CM | POA: Diagnosis not present

## 2019-02-27 DIAGNOSIS — R69 Illness, unspecified: Secondary | ICD-10-CM | POA: Diagnosis not present

## 2019-03-06 ENCOUNTER — Encounter: Payer: Self-pay | Admitting: Cardiology

## 2019-03-08 ENCOUNTER — Ambulatory Visit: Payer: Medicare HMO | Admitting: Cardiology

## 2019-03-08 ENCOUNTER — Other Ambulatory Visit: Payer: Self-pay

## 2019-03-08 ENCOUNTER — Encounter: Payer: Self-pay | Admitting: Cardiology

## 2019-03-08 VITALS — BP 145/83 | HR 65 | Ht 71.0 in | Wt 155.0 lb

## 2019-03-08 DIAGNOSIS — I428 Other cardiomyopathies: Secondary | ICD-10-CM

## 2019-03-08 DIAGNOSIS — I4891 Unspecified atrial fibrillation: Secondary | ICD-10-CM | POA: Insufficient documentation

## 2019-03-08 DIAGNOSIS — I482 Chronic atrial fibrillation, unspecified: Secondary | ICD-10-CM | POA: Diagnosis not present

## 2019-03-08 DIAGNOSIS — I5042 Chronic combined systolic (congestive) and diastolic (congestive) heart failure: Secondary | ICD-10-CM

## 2019-03-08 DIAGNOSIS — F17200 Nicotine dependence, unspecified, uncomplicated: Secondary | ICD-10-CM | POA: Diagnosis not present

## 2019-03-08 DIAGNOSIS — R69 Illness, unspecified: Secondary | ICD-10-CM | POA: Diagnosis not present

## 2019-03-08 NOTE — Progress Notes (Signed)
Primary Physician/Referring:  Tamsen Roers, MD  Patient ID: Mario Proctor, male    DOB: May 23, 1940, 78 y.o.   MRN: [id number]  Chief Complaint  Patient presents with  . Atrial Fibrillation    FOLLOW UP     HPI: Mario Proctor  is a 78 y.o. male  with nonischemic cardiomyopathy by coronary angiogram on 11/02/2016 revealing ejection fraction of 30-35%, ongoning tobacco use disorder, and A fib incidentally found on holter monitor as well as occasional couplets and 2 episodes of NSVT. He is asymptomatic in regards to his A fib and is now on anticoagulation.   Patient is here on a 60-month office visit and follow-up for atrial fibrillation and CHF.  Patient has chronic dyspnea on exertion that is stable. He is on appropriate medical therapy.  He is only able to tolerate 1 tablet of Entresto daily, as twice a day dosing causes him to be nauseous.  Last echocardiogram in Oct 2019 remained stable with continued LVEF of 35%. He without any new complaints today.  No leg edema, PND or orthopnea.  Tolerating medications well.  Continues to smoke 1/2 pack per day.    Past Medical History:  Diagnosis Date  . A-fib ([place])   . Allergy   . Dupuytren contracture    right sm finger  . Myocardial infarction [facility])     Past Surgical History:  Procedure Laterality Date  . COLON SURGERY     bowel blockage  . DUPUYTREN CONTRACTURE RELEASE Left 2011  . FASCIECTOMY Right 05/02/2015   Procedure: FASCIECTOMY RIGHT SMALL FINGER;  Surgeon: Daryll Brod, MD;  Location: [facility];  Service: Orthopedics;  Laterality: Right;  axillary block in preop  . LEFT HEART CATH AND CORONARY ANGIOGRAPHY N/A 11/03/2016   Procedure: Left Heart Cath and Coronary Angiography;  Surgeon: [name], MD;  Location: [facility];  Service: Cardiovascular;  Laterality: N/A;  . TONSILLECTOMY    . TYMPANOPLASTY Right     Social History   Socioeconomic History  . Marital status: Married    Spouse name: Not  on file  . Number of children: 3  . Years of education: Not on file  . Highest education level: Not on file  Occupational History  . Not on file  Social Needs  . Financial resource strain: Not on file  . Food insecurity    Worry: Not on file    Inability: Not on file  . Transportation needs    Medical: Not on file    Non-medical: Not on file  Tobacco Use  . Smoking status: Current Every Day Smoker    Packs/day: 0.50    Years: 54.00    Pack years: 27.00    Types: Cigarettes  . Smokeless tobacco: Never Used  Substance and Sexual Activity  . Alcohol use: Yes    Comment: social  . Drug use: No  . Sexual activity: Not on file  Lifestyle  . Physical activity    Days per week: Not on file    Minutes per session: Not on file  . Stress: Not on file  Relationships  . Social Herbalist on phone: Not on file    Gets together: Not on file    Attends religious service: Not on file    Active member of club or organization: Not on file    Attends meetings of clubs or organizations: Not on file    Relationship status: Not on file  .  Intimate partner violence    Fear of current or ex partner: Not on file    Emotionally abused: Not on file    Physically abused: Not on file    Forced sexual activity: Not on file  Other Topics Concern  . Not on file  Social History Narrative  . Not on file    Current Outpatient Medications on File Prior to Visit  Medication Sig Dispense Refill  . albuterol (ACCUNEB) 1.25 MG/3ML nebulizer solution as needed.    Marland Kitchen albuterol (VENTOLIN HFA) 108 (90 Base) MCG/ACT inhaler as needed.    Marland Kitchen albuterol (VENTOLIN HFA) 108 (90 Base) MCG/ACT inhaler TAKE 2 PUFFS BY MOUTH EVERY 6 HOURS AS NEEDED FOR WHEEZE OR SHORTNESS OF BREATH 8 g 5  . budesonide (PULMICORT) 0.5 MG/2ML nebulizer solution as needed.    Marland Kitchen ENTRESTO 97-103 MG Take 1 tablet by mouth daily. 60 tablet 6  . Glycopyrrolate-Formoterol (BEVESPI AEROSPHERE) 9-4.8 MCG/ACT AERO as needed.    .  metoprolol succinate (TOPROL-XL) 50 MG 24 hr tablet Take 1 tablet by mouth daily.    . rivaroxaban (XARELTO) 20 MG TABS tablet Take 1 tablet (20 mg total) by mouth daily with supper. 30 tablet 6  . TRELEGY ELLIPTA 100-62.5-25 MCG/INH AEPB as needed.     No current facility-administered medications on file prior to visit.     Review of Systems  Constitution: Negative for decreased appetite, malaise/fatigue, weight gain and weight loss.  Eyes: Negative for visual disturbance.  Cardiovascular: Positive for dyspnea on exertion (chronic). Negative for chest pain, claudication, leg swelling, orthopnea, palpitations and syncope.  Respiratory: Positive for cough (chronic). Negative for hemoptysis and wheezing.   Endocrine: Negative for cold intolerance and heat intolerance.  Hematologic/Lymphatic: Does not bruise/bleed easily.  Skin: Negative for nail changes.  Musculoskeletal: Negative for muscle weakness and myalgias.  Gastrointestinal: Negative for abdominal pain, change in bowel habit, nausea and vomiting.  Neurological: Negative for difficulty with concentration, dizziness, focal weakness and headaches.  Psychiatric/Behavioral: Negative for altered mental status and suicidal ideas.  All other systems reviewed and are negative.     Objective  Blood pressure (!) 145/83, pulse 65, height 5\' 11"  (1.803 m), weight 155 lb (70.3 kg), SpO2 95 %. Body mass index is 21.62 kg/m.    Physical Exam  Constitutional: He is oriented to person, place, and time. Vital signs are normal. He appears well-developed and well-nourished.  HENT:  Head: Normocephalic and atraumatic.  Neck: Normal range of motion.  Cardiovascular: Normal rate, normal heart sounds and intact distal pulses. An irregular rhythm present.  Pulmonary/Chest: Effort normal. No accessory muscle usage. No respiratory distress. He has rhonchi.  Abdominal: Soft. Bowel sounds are normal.  Musculoskeletal: Normal range of motion.   Neurological: He is alert and oriented to person, place, and time.  Skin: Skin is warm and dry.  Vitals reviewed.     Radiology: No results found.  Laboratory examination:    CMP Latest Ref Rng & Units 09/01/2018  Glucose 65 - 99 mg/dL 90  BUN 8 - 27 mg/dL 15  Creatinine 0.76 - 1.27 mg/dL 1.13  Sodium 134 - 144 mmol/L 140  Potassium 3.5 - 5.2 mmol/L 4.8  Chloride 96 - 106 mmol/L 99  CO2 20 - 29 mmol/L 26  Calcium 8.6 - 10.2 mg/dL 9.0  Total Protein 6.0 - 8.5 g/dL 6.7  Total Bilirubin 0.0 - 1.2 mg/dL 0.6  Alkaline Phos 39 - 117 IU/L 87  AST 0 - 40 IU/L 19  ALT 0 - 44 IU/L 12   CBC Latest Ref Rng & Units 09/01/2018 04/12/2018 04/07/2010  WBC 3.4 - 10.8 x10E3/uL 6.7 6.7 -  Hemoglobin 13.0 - 17.7 g/dL 15.6 16.1 16.9  Hematocrit 37.5 - 51.0 % 45.9 46.8 -  Platelets 150 - 450 x10E3/uL 241 263.0 -   Lipid Panel     Component Value Date/Time   CHOL 140 09/01/2018 0803   TRIG 54 09/01/2018 0803   HDL 62 09/01/2018 0803   LDLCALC 67 09/01/2018 0803   HEMOGLOBIN A1C No results found for: HGBA1C, MPG TSH Recent Labs    09/01/18 0803  TSH 1.990    Cardiac Studies:   Echocardiogram 02/08/2018: Left ventricle cavity is mildly dilated. Mild concentric hypertrophy of the left ventricle. Moderate decrease in global wall motion. Doppler evidence of grade I (impaired) diastolic dysfunction, normal LAP. Calculated EF 37%. Mild (Grade I) mitral regurgitation. Mild tricuspid regurgitation. Estimated pulmonary artery systolic pressure 35 mmHg. IVC is dilated with blunted respiratory response. Estimated RA pressure 10-15 mmHg. No significant change compared to prior study on 08/26/2017  Holter Monitor 48 hours 09/07/2017: Minimum heart rate 38 bpm at 12:13 AM maximum heart rate 154 bpm at 12:59 PM. PVCs consisted of 3600 beats, 1.5% burden. Occasional ventricular and triplets, 194 couplets.. Bigeminy and trigeminy. 2 episodes of 4 beat 3 beat NSVT. Predominant rhythm was atrial  fibrillation.  Coronary angiogram 11/03/2016: Severe left ventricular systolic dysfunction. The left ventricular ejection fraction is 30-35% by visual estimate with inferior wall akinesis.There is no mitral valve regurgitation. Normal LVEDP. Normal coronary arteries.  Lexiscan myoview stress test 09/18/2016: 1. The resting electrocardiogram demonstrated normal sinus rhythm, normal resting conduction, no resting arrhythmias and normal rest repolarization. Stress EKG is non-diagnostic for ischemia as it a pharmacologic stress using Lexiscan. Stress symptoms included dyspnea. Occasional PVC noted. 2. The LV is dilated both at rest and stress images. The LV end diastolic volume was 0000000. SPECT images demonstrate Medium perfusion abnormality of moderate intensity in the basal inferior, mid inferior and apical inferior myocardial wall(s) on the stress images. The defect remains relatively unchanged between rest and stress images and is a soft tissue attenuation artifact, however scar in this region without ischemia cannot be completely excluded. The left ventricular ejection fraction was calculated or visually estimated to be 25% with global hypokinesis. High risk study.  Abdominal aortic duplex 10/06/2016: Diffuse plaque noted in the proximal, mid and distal aorta. No AAA observed.   Assessment   Chronic atrial fibrillation ([place]) - Plan: EKG 12-Lead  Nonischemic cardiomyopathy (HCC)  Chronic combined systolic and diastolic CHF (congestive heart failure) (HCC)  Tobacco use disorder   CHA2DS2-VASCScore: Risk Score 4,  Yearly risk of stroke  4.0%. Recommendation: Anticoagulation   EKG 03/08/2019: Atrial fibrillation with controlled ventricular response at 60 bpm, 1 PVC, left axis deviation, left anterior fascicular block, anteroseptal infarct old.  No evidence of ischemia.  Normal QT interval.  Recommendations:   Patient is here on a 77-month office visit and follow-up for A. fib and  nonischemic cardiomyopathy.  In regards to atrial fibrillation, he remains rate controlled.  He is on Xarelto for anticoagulation and tolerates this well.  He has not had recent labs, will obtain CBC and BMP for surveillance.  He is without evidence of decompensated heart failure.  His EF has remained stable at 35% by his last echo approximately 1 year ago.  EF has not improved since being on Entresto and beta-blocker therapy.  I have not  ordered repeat echocardiogram today as he is symptomatically doing well, but may consider this at his next appointment.  Unfortunately, he does continue to smoke.  He is currently smoking half a pack per day.  We have had multiple conversations regarding complete smoking cessation.  He is not interested in medications to help him quit and wants to continue to try to work on this on his own.  We will plan to see him back in 6 months or sooner if needed.  Miquel Dunn, MSN, APRN, FNP-C [facility]. [place] Office: [phone] Fax: [phone]

## 2019-03-16 DIAGNOSIS — R69 Illness, unspecified: Secondary | ICD-10-CM | POA: Diagnosis not present

## 2019-04-02 DIAGNOSIS — R69 Illness, unspecified: Secondary | ICD-10-CM | POA: Diagnosis not present

## 2019-04-12 DIAGNOSIS — L400 Psoriasis vulgaris: Secondary | ICD-10-CM | POA: Diagnosis not present

## 2019-04-12 DIAGNOSIS — L299 Pruritus, unspecified: Secondary | ICD-10-CM | POA: Diagnosis not present

## 2019-04-18 DIAGNOSIS — R69 Illness, unspecified: Secondary | ICD-10-CM | POA: Diagnosis not present

## 2019-05-01 DIAGNOSIS — R69 Illness, unspecified: Secondary | ICD-10-CM | POA: Diagnosis not present

## 2019-05-15 ENCOUNTER — Other Ambulatory Visit: Payer: Self-pay

## 2019-05-15 MED ORDER — TRELEGY ELLIPTA 100-62.5-25 MCG/INH IN AEPB: 1.0000 | INHALATION_SPRAY | RESPIRATORY_TRACT | 0 refills | Status: DC | PRN

## 2019-05-23 ENCOUNTER — Other Ambulatory Visit: Payer: Self-pay | Admitting: Cardiology

## 2019-05-24 ENCOUNTER — Other Ambulatory Visit: Payer: Self-pay | Admitting: Pulmonary Disease

## 2019-06-06 ENCOUNTER — Ambulatory Visit: Payer: Medicare HMO

## 2019-06-08 DIAGNOSIS — H5211 Myopia, right eye: Secondary | ICD-10-CM | POA: Diagnosis not present

## 2019-06-08 DIAGNOSIS — H2513 Age-related nuclear cataract, bilateral: Secondary | ICD-10-CM | POA: Diagnosis not present

## 2019-06-08 DIAGNOSIS — H5202 Hypermetropia, left eye: Secondary | ICD-10-CM | POA: Diagnosis not present

## 2019-06-09 DIAGNOSIS — G8929 Other chronic pain: Secondary | ICD-10-CM | POA: Diagnosis not present

## 2019-06-09 DIAGNOSIS — N529 Male erectile dysfunction, unspecified: Secondary | ICD-10-CM | POA: Diagnosis not present

## 2019-06-09 DIAGNOSIS — I509 Heart failure, unspecified: Secondary | ICD-10-CM | POA: Diagnosis not present

## 2019-06-09 DIAGNOSIS — R69 Illness, unspecified: Secondary | ICD-10-CM | POA: Diagnosis not present

## 2019-06-09 DIAGNOSIS — M1990 Unspecified osteoarthritis, unspecified site: Secondary | ICD-10-CM | POA: Diagnosis not present

## 2019-06-09 DIAGNOSIS — Z008 Encounter for other general examination: Secondary | ICD-10-CM | POA: Diagnosis not present

## 2019-06-09 DIAGNOSIS — Z7901 Long term (current) use of anticoagulants: Secondary | ICD-10-CM | POA: Diagnosis not present

## 2019-06-09 DIAGNOSIS — I252 Old myocardial infarction: Secondary | ICD-10-CM | POA: Diagnosis not present

## 2019-06-09 DIAGNOSIS — I4891 Unspecified atrial fibrillation: Secondary | ICD-10-CM | POA: Diagnosis not present

## 2019-06-09 DIAGNOSIS — J449 Chronic obstructive pulmonary disease, unspecified: Secondary | ICD-10-CM | POA: Diagnosis not present

## 2019-06-09 DIAGNOSIS — D6869 Other thrombophilia: Secondary | ICD-10-CM | POA: Diagnosis not present

## 2019-06-12 ENCOUNTER — Other Ambulatory Visit: Payer: Self-pay | Admitting: Pulmonary Disease

## 2019-06-15 ENCOUNTER — Ambulatory Visit: Payer: Medicare HMO | Attending: Internal Medicine

## 2019-06-15 DIAGNOSIS — Z23 Encounter for immunization: Secondary | ICD-10-CM | POA: Insufficient documentation

## 2019-06-15 NOTE — Progress Notes (Signed)
   Covid-19 Vaccination Clinic  Name:  CICERO WICKES    MRN: [id number] DOB: 18-Dec-1940  06/15/2019  Mr. Scholler was observed post Covid-19 immunization for 15 minutes without incidence. He was provided with Vaccine Information Sheet and instruction to access the V-Safe system.   Mr. Cobbett was instructed to call 911 with any severe reactions post vaccine: Marland Kitchen Difficulty breathing  . Swelling of your face and throat  . A fast heartbeat  . A bad rash all over your body  . Dizziness and weakness    Immunizations Administered    Name Date Dose VIS Date Route   Pfizer COVID-19 Vaccine 06/15/2019  8:26 AM 0.3 mL 04/21/2019 Intramuscular   Manufacturer: [place]   Lot: [id number]   [place]: [id number]

## 2019-07-10 ENCOUNTER — Ambulatory Visit: Payer: Medicare HMO | Attending: Internal Medicine

## 2019-07-10 DIAGNOSIS — Z23 Encounter for immunization: Secondary | ICD-10-CM | POA: Insufficient documentation

## 2019-07-10 NOTE — Progress Notes (Signed)
   Covid-19 Vaccination Clinic  Name:  Mario Proctor    MRN: [id number] DOB: 08-11-1940  07/10/2019  Mario Proctor was observed post Covid-19 immunization for 15 minutes without incidence. He was provided with Vaccine Information Sheet and instruction to access the V-Safe system.   Mario Proctor was instructed to call 911 with any severe reactions post vaccine: Marland Kitchen Difficulty breathing  . Swelling of your face and throat  . A fast heartbeat  . A bad rash all over your body  . Dizziness and weakness    Immunizations Administered    Name Date Dose VIS Date Route   Pfizer COVID-19 Vaccine 07/10/2019 12:45 PM 0.3 mL 04/21/2019 Intramuscular   Manufacturer: [place]   Lot: [id number]   [place]: [id number]

## 2019-07-17 ENCOUNTER — Other Ambulatory Visit: Payer: Self-pay | Admitting: Pulmonary Disease

## 2019-07-21 ENCOUNTER — Encounter: Payer: Self-pay | Admitting: Pulmonary Disease

## 2019-07-21 ENCOUNTER — Ambulatory Visit: Payer: Medicare HMO | Admitting: Pulmonary Disease

## 2019-07-21 ENCOUNTER — Other Ambulatory Visit: Payer: Self-pay

## 2019-07-21 ENCOUNTER — Ambulatory Visit (INDEPENDENT_AMBULATORY_CARE_PROVIDER_SITE_OTHER): Payer: Medicare HMO

## 2019-07-21 VITALS — BP 136/80 | HR 65 | Temp 97.0°F | Ht 71.0 in | Wt 162.6 lb

## 2019-07-21 DIAGNOSIS — R0600 Dyspnea, unspecified: Secondary | ICD-10-CM | POA: Diagnosis not present

## 2019-07-21 DIAGNOSIS — J449 Chronic obstructive pulmonary disease, unspecified: Secondary | ICD-10-CM | POA: Diagnosis not present

## 2019-07-21 DIAGNOSIS — F17200 Nicotine dependence, unspecified, uncomplicated: Secondary | ICD-10-CM

## 2019-07-21 DIAGNOSIS — R69 Illness, unspecified: Secondary | ICD-10-CM | POA: Diagnosis not present

## 2019-07-21 NOTE — Progress Notes (Signed)
Mario Proctor    [id number]    Oct 13, 1940  Primary Care Physician:Little, Jeneen Rinks, MD  Referring Physician: Tamsen Roers, [address],  [place] [address]  Chief complaint:  Follow up for COPD GOLD A  HPI:  Mario Proctor is a 79 year old with active smoking history, allergies, COPD GOLD A (CAT score 9, no exacerbations), atrial fibrillation.  He has symptoms of dyspnea on exertion for the past several years. He gets short of breath with excessive physical activity but is able to carry out his normal daily activities. He is able to climb a flight of stairs, walk to his mailbox. 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 He has chronic cough with minimal sputum production. No wheezing, hemoptysis, fevers, chills. He had been on albuterol many years ago and is currently not using any inhaler medication.   He was evaluated by Dr. Einar Gip for frequent PVCs. He was unable to complete the treadmill stress test due to marked dyspnea and PVCs. An echocardiogram and a pharmacologic stress test has been done. Cardiac cath shows nonischemic cardiomyopathy. Started on Xarelto by Dr. Einar Gip for new onset atrial fibrillation.  Pets: Cats, dog. No birds, exotic pets, farm animals Occupation: Worked as a Geophysicist/field seismologist for [employer]. Exposures: Has some exposure to lead in his line of work. Denies exposure to asbestos, dust, etc., Smoking history: 54-pack-year smoking history. Continues to smoke 1 pack per day  Interim History: Bevespi changed to Trelegy inhaler due to elevated peripheral eosinophils.  He likes the Trelegy and feels that it helps a lot but is using it only intermittently due to cost of medication Continues to have dyspnea on exertion, chronic cough.   Outpatient Encounter Medications as of 07/21/2019  Medication Sig  . albuterol (ACCUNEB) 1.25 MG/3ML nebulizer solution 1 ampule every 4 (four) hours as needed.   Marland Kitchen albuterol (VENTOLIN HFA) 108 (90 Base) MCG/ACT  inhaler as needed.  Marland Kitchen albuterol (VENTOLIN HFA) 108 (90 Base) MCG/ACT inhaler INHALE 2 PUFF BY MOUTH EVERY 6 HOURS AS NEEDED FOR WHEEZE OR SHORTNESS OF BREATH  . budesonide (PULMICORT) 0.5 MG/2ML nebulizer solution Take 0.5 mg by nebulization as needed.   Marland Kitchen ENTRESTO 97-103 MG Take 1 tablet by mouth daily.  . metoprolol succinate (TOPROL-XL) 50 MG 24 hr tablet Take 1 tablet by mouth daily.  . TRELEGY ELLIPTA 100-62.5-25 MCG/INH AEPB Inhale 1 puff into the lungs as needed.  Alveda Reasons 20 MG TABS tablet TAKE 1 TABLET (20 MG TOTAL) BY MOUTH DAILY WITH SUPPER.  . [DISCONTINUED] Glycopyrrolate-Formoterol (BEVESPI AEROSPHERE) 9-4.8 MCG/ACT AERO as needed.    No facility-administered encounter medications on file as of 07/21/2019.   Physical Exam: Blood pressure 136/80, pulse 65, temperature (!) 97 F (36.1 C), temperature source Temporal, height 5\' 11"  (1.803 m), weight 162 lb 9.6 oz (73.8 kg), SpO2 96 %. Gen:      No acute distress HEENT:  EOMI, sclera anicteric Neck:     No masses; no thyromegaly Lungs:    Clear to auscultation bilaterally; normal respiratory effort CV:         Regular rate and rhythm; no murmurs Abd:      + bowel sounds; soft, non-tender; no palpable masses, no distension Ext:    No edema; adequate peripheral perfusion Skin:      Warm and dry; no rash Neuro: alert and oriented x 3 Psych: normal mood and affect  Data Reviewed: Imaging  Chest x-ray 10/15/16-hyperinflation, mild scoliosis and lordosis. I have reviewed all images personally  PFTs 01/07/17 FVC 3.96 [89%), FEV1 1.86 [58%), F/F 47, TLC 113%, RV/TLC 130%, DLCO 46% Moderate obstruction with diffusion impairment, air trapping  Cardiac Cardiac cath 11/03/16 Findings are consistent with nonischemic dilated cardiomyopathy (EF30-35%), however with inferior akinesis, patient probably had coronary spasm leading to myocardial infarction or probably prior occlusion which is revascularized spontaneously. Continued aggressive  risk modification and therapy for severe LV systolic dysfunction is indicated  Treadmill stress stress 07/24/16 Suboptimal treadmill stress test due to marked dyspnea, frequent PVC  Labs CBC 09/01/2018-WBC 6.7, eos 9%, absolute eosinophil count 603  Assessment:  Follow up for COPD GOLD A, chronic bronchitis Continue Trelegy.  No desaturations on exertion Pharmacy referral for patient assistance with inhalers Chest x-ray today  Active smoker Continues to smoke 1 pack/day.  He has tried nicotine in the past without success.  He wants to quit on his own. Refer for low-dose screening CT of the chest  Health maintenance 6/12-Pneumovax.  Plan/Recommendations: - Trelegy. albuterol as needed - Patient assistance for inhalers - Chest x-ray - Screening CT referral.  Marshell Garfinkel MD [facility] 07/21/2019, 10:57 AM  CC: Tamsen Roers, MD

## 2019-07-21 NOTE — Addendum Note (Signed)
Addended by: Hildred Alamin I on: 07/21/2019 11:13 AM   Modules accepted: Orders

## 2019-07-21 NOTE — Patient Instructions (Signed)
Will refer to pharmacy to see if there is any patient assistance for Trelegy inhaler Continue to work on smoking cessation We will also refer you for low-dose screening CTs of the chest Chest x-ray today  Follow-up in 6 months

## 2019-07-24 ENCOUNTER — Other Ambulatory Visit: Payer: Self-pay | Admitting: *Deleted

## 2019-07-24 DIAGNOSIS — F17210 Nicotine dependence, cigarettes, uncomplicated: Secondary | ICD-10-CM

## 2019-07-24 DIAGNOSIS — Z87891 Personal history of nicotine dependence: Secondary | ICD-10-CM

## 2019-07-27 ENCOUNTER — Other Ambulatory Visit: Payer: Self-pay | Admitting: Pulmonary Disease

## 2019-08-14 ENCOUNTER — Other Ambulatory Visit: Payer: Self-pay | Admitting: Pulmonary Disease

## 2019-08-14 ENCOUNTER — Other Ambulatory Visit: Payer: Self-pay

## 2019-08-14 MED ORDER — METOPROLOL SUCCINATE ER 50 MG PO TB24: 50.0000 mg | ORAL_TABLET | Freq: Every day | ORAL | 1 refills | Status: DC

## 2019-08-16 ENCOUNTER — Other Ambulatory Visit: Payer: Self-pay

## 2019-08-16 ENCOUNTER — Encounter: Payer: Self-pay | Admitting: Acute Care

## 2019-08-16 ENCOUNTER — Ambulatory Visit
Admission: RE | Admit: 2019-08-16 | Discharge: 2019-08-16 | Disposition: A | Discharge status: Home / Self Care | Payer: Medicare HMO | Source: Ambulatory Visit | Attending: Acute Care | Admitting: Acute Care

## 2019-08-16 ENCOUNTER — Telehealth: Payer: Self-pay | Admitting: Acute Care

## 2019-08-16 ENCOUNTER — Ambulatory Visit (INDEPENDENT_AMBULATORY_CARE_PROVIDER_SITE_OTHER): Payer: Medicare HMO | Admitting: Acute Care

## 2019-08-16 DIAGNOSIS — F17210 Nicotine dependence, cigarettes, uncomplicated: Secondary | ICD-10-CM

## 2019-08-16 DIAGNOSIS — Z87891 Personal history of nicotine dependence: Secondary | ICD-10-CM

## 2019-08-16 DIAGNOSIS — R69 Illness, unspecified: Secondary | ICD-10-CM | POA: Diagnosis not present

## 2019-08-16 NOTE — Patient Instructions (Signed)
Thank you for participating in the [facility]. It was our pleasure to meet you today. We will call you with the results of your scan within the next few days. Your scan will be assigned a Lung RADS category score by the physicians reading the scans.  This Lung RADS score determines follow up scanning.  See below for description of categories, and follow up screening recommendations. We will be in touch to schedule your follow up screening annually or based on recommendations of our providers. We will fax a copy of your scan results to your Primary Care Physician, or the physician who referred you to the program, to ensure they have the results. Please call the office if you have any questions or concerns regarding your scanning experience or results.  Our office number is [phone]. Please speak with Denise Phelps, RN. She is our Lung Cancer Screening RN. If she is unavailable when you call, please have the office staff send her a message. She will return your call at her earliest convenience. Remember, if your scan is normal, we will scan you annually as long as you continue to meet the criteria for the program. (Age 55-77, Current smoker or smoker who has quit within the last 15 years). If you are a smoker, remember, quitting is the single most powerful action that you can take to decrease your risk of lung cancer and other pulmonary, breathing related problems. We know quitting is hard, and we are here to help.  Please let us know if there is anything we can do to help you meet your goal of quitting. If you are a former smoker, congratulations. We are proud of you! Remain smoke free! Remember you can refer friends or family members through the number above.  We will screen them to make sure they meet criteria for the program. Thank you for helping us take better care of you by participating in Lung Screening.  Lung RADS Categories:  Lung RADS 1: no nodules  or definitely non-concerning nodules.  Recommendation is for a repeat annual scan in 12 months.  Lung RADS 2:  nodules that are non-concerning in appearance and behavior with a very low likelihood of becoming an active cancer. Recommendation is for a repeat annual scan in 12 months.  Lung RADS 3: nodules that are probably non-concerning , includes nodules with a low likelihood of becoming an active cancer.  Recommendation is for a 6-month repeat screening scan. Often noted after an upper respiratory illness. We will be in touch to make sure you have no questions, and to schedule your 6-month scan.  Lung RADS 4 A: nodules with concerning findings, recommendation is most often for a follow up scan in 3 months or additional testing based on our provider's assessment of the scan. We will be in touch to make sure you have no questions and to schedule the recommended 3 month follow up scan.  Lung RADS 4 B:  indicates findings that are concerning. We will be in touch with you to schedule additional diagnostic testing based on our provider's  assessment of the scan.   

## 2019-08-16 NOTE — Progress Notes (Signed)
Shared Decision Making Visit Lung Cancer Screening Program [phone])   Eligibility:  Age 79 y.o.  Pack Years Smoking History Calculation 57 pack year smoking history (# packs/per year x # years smoked)  Recent History of coughing up blood  no  Unexplained weight loss? no ( >Than 15 pounds within the last 6 months )  Prior History Lung / other cancer no (Diagnosis within the last 5 years already requiring surveillance chest CT Scans).  Smoking Status Current Smoker  Former Smokers: Years since quit: NA  Quit Date: NA  Visit Components:  Discussion included one or more decision making aids. yes  Discussion included risk/benefits of screening. yes  Discussion included potential follow up diagnostic testing for abnormal scans. yes  Discussion included meaning and risk of over diagnosis. yes  Discussion included meaning and risk of False Positives. yes  Discussion included meaning of total radiation exposure. yes  Counseling Included:  Importance of adherence to annual lung cancer LDCT screening. yes  Impact of comorbidities on ability to participate in the program. yes  Ability and willingness to under diagnostic treatment. yes  Smoking Cessation Counseling:  Current Smokers:   Discussed importance of smoking cessation. yes  Information about tobacco cessation classes and interventions provided to patient. yes  Patient provided with "ticket" for LDCT Scan. yes  Symptomatic Patient. no  Counseling:NA  Diagnosis Code: Tobacco Use Z72.0  Asymptomatic Patient yes  Counseling (Intermediate counseling: > three minutes counseling) [id number]  Former Smokers:   Discussed the importance of maintaining cigarette abstinence. yes  Diagnosis Code: Personal History of Nicotine Dependence. M75.449  Information about tobacco cessation classes and interventions provided to patient. Yes  Patient provided with "ticket" for LDCT Scan. yes  Written Order for Lung Cancer  Screening with LDCT placed in Epic. Yes (CT Chest Lung Cancer Screening Low Dose W/O CM) [id number] Z12.2-Screening of respiratory organs Z87.891-Personal history of nicotine dependence  This visit was done virtually, therefore no vital signs were taken  I have spent 25 minutes of face to face time with Mario Proctor discussing the risks and benefits of lung cancer screening. We viewed a power point together that explained in detail the above noted topics. We paused at intervals to allow for questions to be asked and answered to ensure understanding.We discussed that the single most powerful action that he can take to decrease his risk of developing lung cancer is to quit smoking. We discussed whether or not he is ready to commit to setting a quit date. We discussed options for tools to aid in quitting smoking including nicotine replacement therapy, non-nicotine medications, support groups, Quit Smart classes, and behavior modification. We discussed that often times setting smaller, more achievable goals, such as eliminating 1 cigarette a day for a week and then 2 cigarettes a day for a week can be helpful in slowly decreasing the number of cigarettes smoked. This allows for a sense of accomplishment as well as providing a clinical benefit. I gave him the " Be Stronger Than Your Excuses" card with contact information for community resources, classes, free nicotine replacement therapy, and access to mobile apps, text messaging, and on-line smoking cessation help. I have also given him my card and contact information in the event he needs to contact me. We discussed the time and location of the scan, and that either Doroteo Glassman RN or I will call with the results within 24-48 hours of receiving them. I have offered him  a copy of the power point we  viewed  as a resource in the event they need reinforcement of the concepts we discussed today in the office. The patient verbalized understanding of all of  the above and  had no further questions upon leaving the office. They have my contact information in the event they have any further questions.  I spent 4 minutes counseling on smoking cessation and the health risks of continued tobacco abuse.  I explained to the patient that there has been a high incidence of coronary artery disease noted on these exams. I explained that this is a non-gated exam therefore degree or severity cannot be determined. This patient is not on statin therapy. I have asked the patient to follow-up with their PCP regarding any incidental finding of coronary artery disease and management with diet or medication as their PCP  feels is clinically indicated. The patient verbalized understanding of the above and had no further questions upon completion of the visit.      Magdalen Spatz, NP 08/16/2019 9:51 AM

## 2019-08-16 NOTE — Telephone Encounter (Signed)
IMPRESSION: 1. Lung-RADS Category 2, benign appearance or behavior. Continue annual screening with low-dose chest CT without contrast in 12 months. 2. Exophytic 1.6 cm indeterminate upper right renal cortical lesion, renal cell carcinoma not excluded. Dedicated renal mass protocol MRI (preferred) or CT abdomen without and with IV contrast recommended for further evaluation. 3. Aortic Atherosclerosis (ICD10-I70.0) and Emphysema (ICD10-J43.9). ------------------------------------------------------------------------ Spoke with [facility]. They were calling to give a call report on the pt's LDCT. Will route to Sarah.

## 2019-08-17 ENCOUNTER — Telehealth: Payer: Self-pay | Admitting: Acute Care

## 2019-08-17 ENCOUNTER — Other Ambulatory Visit: Payer: Self-pay | Admitting: Acute Care

## 2019-08-17 DIAGNOSIS — Z87891 Personal history of nicotine dependence: Secondary | ICD-10-CM

## 2019-08-17 DIAGNOSIS — F17210 Nicotine dependence, cigarettes, uncomplicated: Secondary | ICD-10-CM

## 2019-08-17 DIAGNOSIS — N281 Cyst of kidney, acquired: Secondary | ICD-10-CM

## 2019-08-17 NOTE — Telephone Encounter (Signed)
Please see telephone note dated 08/17/2019

## 2019-08-17 NOTE — Telephone Encounter (Signed)
I have called Mario Proctor with the results of his low-dose CT.  I explained that from a lung cancer perspective his scan was read as a Lung RADS 2: nodules that are benign in appearance and behavior with a very low likelihood of becoming a clinically active cancer due to size or lack of growth. Recommendation per radiology is for a repeat LDCT in 12 months. We will order and schedule his follow-up screening scan for April 2022. There was an incidental finding of an exophytic 1.6 cm indeterminant upper right renal cortical lesion.  Per the radiology report renal cell carcinoma is not excluded.  A CT abdomen with and without IV contrast was recommended and has been ordered.  Mario Proctor understands someone will call him to get this scheduled.  He has requested that this be done at [facility] as that is closest to his home. I attempted to call his primary care doctor at [facility].  I left a message on a voicemail.  I have  ordered the CT scan to expedite this for the patient.  Patient verbalized understanding of the above and had no further questions at completion of the call.

## 2019-08-17 NOTE — Progress Notes (Signed)
Please see telephone note dated 08/17/2019. I have called the patient with his results. Langley Gauss, please order 12 month follow up CT and fax results to PCP. I have ordered the follow up CT Abdomen to evaluate the renal lesion. Thanks

## 2019-08-18 ENCOUNTER — Other Ambulatory Visit
Admission: RE | Admit: 2019-08-18 | Discharge: 2019-08-18 | Disposition: A | Discharge status: Home / Self Care | Payer: Medicare HMO | Source: Ambulatory Visit | Attending: Acute Care | Admitting: Acute Care

## 2019-08-18 DIAGNOSIS — N281 Cyst of kidney, acquired: Secondary | ICD-10-CM | POA: Insufficient documentation

## 2019-08-18 LAB — BASIC METABOLIC PANEL
Anion gap: 8 (ref 5–15)
BUN: 15 mg/dL (ref 8–23)
CO2: 28 mmol/L (ref 22–32)
Calcium: 8.7 mg/dL — ABNORMAL LOW (ref 8.9–10.3)
Chloride: 97 mmol/L — ABNORMAL LOW (ref 98–111)
Creatinine, Ser: 1.16 mg/dL (ref 0.61–1.24)
GFR calc Af Amer: >60 mL/min (ref >60)
GFR calc non Af Amer: >60 mL/min (ref >60)
Glucose, Bld: 121 mg/dL — ABNORMAL HIGH (ref 70–99)
Potassium: 4.5 mmol/L (ref 3.5–5.1)
Sodium: 133 mmol/L — ABNORMAL LOW (ref 135–145)

## 2019-08-28 ENCOUNTER — Ambulatory Visit
Admission: RE | Admit: 2019-08-28 | Discharge: 2019-08-28 | Disposition: A | Discharge status: Home / Self Care | Payer: Medicare HMO | Source: Ambulatory Visit | Attending: Acute Care | Admitting: Acute Care

## 2019-08-28 ENCOUNTER — Other Ambulatory Visit: Payer: Self-pay

## 2019-08-28 DIAGNOSIS — N281 Cyst of kidney, acquired: Secondary | ICD-10-CM | POA: Insufficient documentation

## 2019-08-28 DIAGNOSIS — N2889 Other specified disorders of kidney and ureter: Secondary | ICD-10-CM | POA: Diagnosis not present

## 2019-08-28 HISTORY — DX: Heart failure, unspecified: I50.9

## 2019-08-28 MED ORDER — IOHEXOL 300 MG/ML  SOLN
100.0000 mL | Freq: Once | INTRAMUSCULAR | Status: AC | PRN
  Administered 2019-08-28: 100 mL via INTRAVENOUS

## 2019-08-29 NOTE — Progress Notes (Signed)
Langley Gauss, will you please let the patient know the CT abdomen showed a benign proteinaceous or hemorrhagic cyst. No further routine follow-up or characterization is required. Thanks so much  ( This was noted on a screening CT)

## 2019-09-04 DIAGNOSIS — I482 Chronic atrial fibrillation, unspecified: Secondary | ICD-10-CM | POA: Diagnosis not present

## 2019-09-04 NOTE — Progress Notes (Signed)
Primary Physician/Referring:  Tamsen Roers, MD  Patient ID: Mario Proctor, male    DOB: 1940-10-20, 79 y.o.   MRN: [id number]  Chief Complaint  Patient presents with  . Follow-up    6 month  . Atrial Fibrillation   HPI:    Mario Proctor  is a 79 y.o. Caucasian male patient  with nonischemic cardiomyopathy by coronary angiogram on 11/02/2016 revealing ejection fraction of 30-35%, ongoning tobacco use disorder, and permanent A fib. He is asymptomatic in regards to his A fib and is now on anticoagulation.   Patient is here on a 21-month office visit and follow-up for atrial fibrillation and CHF.  Patient has chronic dyspnea on exertion that is stable. He is on appropriate guideline directed medical therapy.  He is only able to tolerate 1 tablet of Entresto daily, as twice a day dosing causes him to be nauseous. He without any new complaints today.  No leg edema, PND or orthopnea.  Tolerating medications well.  Continues to smoke 1/2 pack per day.  Past Medical History:  Diagnosis Date  . A-fib ([place])   . Allergy   . CHF (congestive heart failure) ([place])   . Dupuytren contracture    right sm finger  . Myocardial infarction [facility])    Past Surgical History:  Procedure Laterality Date  . COLON SURGERY     bowel blockage  . DUPUYTREN CONTRACTURE RELEASE Left 2011  . FASCIECTOMY Right 05/02/2015   Procedure: FASCIECTOMY RIGHT SMALL FINGER;  Surgeon: Daryll Brod, MD;  Location: [facility];  Service: Orthopedics;  Laterality: Right;  axillary block in preop  . LEFT HEART CATH AND CORONARY ANGIOGRAPHY N/A 11/03/2016   Procedure: Left Heart Cath and Coronary Angiography;  Surgeon: [name], MD;  Location: [facility];  Service: Cardiovascular;  Laterality: N/A;  . TONSILLECTOMY    . TYMPANOPLASTY Right    Family History  Problem Relation Age of Onset  . Cancer Brother        "in heart and lungs"     Social History   Tobacco Use  . Smoking status: Current  Every Day Smoker    Packs/day: 1.00    Years: 57.00    Pack years: 57.00    Types: Cigarettes  . Smokeless tobacco: Never Used  Substance Use Topics  . Alcohol use: Yes    Comment: social   Marital Status: Married  ROS  Review of Systems  Constitution: Negative for chills, decreased appetite, malaise/fatigue and weight gain.  Cardiovascular: Positive for dyspnea on exertion (chronic). Negative for chest pain, claudication, leg swelling, orthopnea, palpitations and syncope.  Respiratory: Positive for cough (chronic).   Endocrine: Negative for cold intolerance.  Hematologic/Lymphatic: Does not bruise/bleed easily.  Musculoskeletal: Negative for joint swelling.  Gastrointestinal: Negative for abdominal pain, anorexia, change in bowel habit, hematochezia and melena.  Neurological: Negative for headaches and light-headedness.  Psychiatric/Behavioral: Negative for depression and substance abuse.   Objective  Blood pressure 132/70, pulse (!) 55, temperature (!) 97.2 F (36.2 C), temperature source Temporal, resp. rate 15, height 5\' 11"  (1.803 m), weight 194 lb (88 kg), SpO2 99 %.  Vitals with BMI 09/07/2019 07/21/2019 03/08/2019  Height 5\' 11"  5\' 11"  5\' 11"   Weight 194 lbs 162 lbs 10 oz 155 lbs  BMI 27.07 64.33 29.51  Systolic 884 166 063  Diastolic 70 80 83  Pulse 55 65 65     Physical Exam  Constitutional: He appears well-developed and well-nourished.  HENT:  Head: Atraumatic.  Eyes: Conjunctivae are normal.  Neck: No JVD present. No thyromegaly present.  Cardiovascular: Normal rate and intact distal pulses. An irregular rhythm present. Exam reveals no gallop.  No murmur heard. Pulmonary/Chest: Effort normal. No accessory muscle usage. No respiratory distress. He has rhonchi.  Abdominal: Soft. Bowel sounds are normal.  Musculoskeletal:        General: Normal range of motion.     Cervical back: Neck supple.  Neurological: He is alert.  Skin: Skin is warm and dry.   Psychiatric: He has a normal mood and affect.   Laboratory examination:   Recent Labs    08/18/19 1149 09/04/19 1030  NA 133* 134  K 4.5 5.1  CL 97* 96  CO2 28 24  GLUCOSE 121* 85  BUN 15 14  CREATININE 1.16 1.05  CALCIUM 8.7* 8.7  GFRNONAA >60 68  GFRAA >60 79   estimated creatinine clearance is 62.8 mL/min (by C-G formula based on SCr of 1.05 mg/dL).  CMP Latest Ref Rng & Units 09/04/2019 08/18/2019 09/01/2018  Glucose 65 - 99 mg/dL 85 121(H) 90  BUN 8 - 27 mg/dL 14 15 15   Creatinine 0.76 - 1.27 mg/dL 1.05 1.16 1.13  Sodium 134 - 144 mmol/L 134 133(L) 140  Potassium 3.5 - 5.2 mmol/L 5.1 4.5 4.8  Chloride 96 - 106 mmol/L 96 97(L) 99  CO2 20 - 29 mmol/L 24 28 26   Calcium 8.6 - 10.2 mg/dL 8.7 8.7(L) 9.0  Total Protein 6.0 - 8.5 g/dL - - 6.7  Total Bilirubin 0.0 - 1.2 mg/dL - - 0.6  Alkaline Phos 39 - 117 IU/L - - 87  AST 0 - 40 IU/L - - 19  ALT 0 - 44 IU/L - - 12   CBC Latest Ref Rng & Units 09/04/2019 09/01/2018 04/12/2018  WBC 3.4 - 10.8 x10E3/uL 6.8 6.7 6.7  Hemoglobin 13.0 - 17.7 g/dL 15.3 15.6 16.1  Hematocrit 37.5 - 51.0 % 42.9 45.9 46.8  Platelets 150 - 450 x10E3/uL 209 241 263.0   Lipid Panel     Component Value Date/Time   CHOL 140 09/01/2018 0803   TRIG 54 09/01/2018 0803   HDL 62 09/01/2018 0803   LDLCALC 67 09/01/2018 0803    Medications and allergies   Allergies  Allergen Reactions  . Codeine Itching  . Oxycodone Rash     Current Outpatient Medications  Medication Instructions  . albuterol (ACCUNEB) 1.25 MG/3ML nebulizer solution 1 ampule, Every 4 hours PRN  . albuterol (VENTOLIN HFA) 108 (90 Base) MCG/ACT inhaler As needed  . albuterol (VENTOLIN HFA) 108 (90 Base) MCG/ACT inhaler INHALE 2 PUFF BY MOUTH EVERY 6 HOURS AS NEEDED FOR WHEEZE OR SHORTNESS OF BREATH  . budesonide (PULMICORT) 0.5 mg, Nebulization, As needed  . ENTRESTO 97-103 MG 1 tablet, Oral, Daily  . Fluticasone-Umeclidin-Vilant (TRELEGY ELLIPTA) 100-62.5-25 MCG/INH AEPB Inhalation   . metoprolol succinate (TOPROL-XL) 50 mg, Oral, Daily  . rosuvastatin (CRESTOR) 5 mg, Oral, Daily  . TRELEGY ELLIPTA 100-62.5-25 MCG/INH AEPB INHALE 1 PUFF INTO THE LUNGS EVERY DAY AS NEEDED  . XARELTO 20 MG TABS tablet TAKE 1 TABLET (20 MG TOTAL) BY MOUTH DAILY WITH SUPPER.   Radiology:   Low-dose CT chest 08/16/2019: Coronary and aortic atherosclerosis and calcification.  Severe centrilobular emphysema.  2 benign looking nodules.  Benign right upper kidney mass.  Cardiac Studies:   Lexiscan myoview stress test 09/18/2016: 1. The resting electrocardiogram demonstrated normal sinus rhythm, normal resting conduction, no resting arrhythmias and  normal rest repolarization. Stress EKG is non-diagnostic for ischemia as it a pharmacologic stress using Lexiscan. Stress symptoms included dyspnea. Occasional PVC noted. 2. The LV is dilated both at rest and stress images. The LV end diastolic volume was 073XT. SPECT images demonstrate Medium perfusion abnormality of moderate intensity in the basal inferior, mid inferior and apical inferior myocardial wall(s) on the stress images. The defect remains relatively unchanged between rest and stress images and is a soft tissue attenuation artifact, however scar in this region without ischemia cannot be completely excluded. The left ventricular ejection fraction was calculated or visually estimated to be 25% with global hypokinesis. High risk study.  Abdominal aortic duplex 10/06/2016: Diffuse plaque noted in the proximal, mid and distal aorta. No AAA observed.  Coronary angiogram 11/03/2016: Severe left ventricular systolic dysfunction. The left ventricular ejection fraction is 30-35% by visual estimate with inferior wall akinesis.There is no mitral valve regurgitation. Normal LVEDP. Normal coronary arteries.  Holter Monitor 48 hours 09/07/2017: Minimum heart rate 38 bpm at 12:13 AM maximum heart rate 154 bpm at 12:59 PM. PVCs consisted of 3600 beats, 1.5%  burden. Occasional ventricular and triplets, 194 couplets.. Bigeminy and trigeminy. 2 episodes of 4 beat 3 beat NSVT. Predominant rhythm was atrial fibrillation.  Echocardiogram 02/08/2018: Left ventricle cavity is mildly dilated. Mild concentric hypertrophy of the left ventricle. Moderate decrease in global wall motion. Doppler evidence of grade I (impaired) diastolic dysfunction, normal LAP. Calculated EF 37%. Mild (Grade I) mitral regurgitation. Mild tricuspid regurgitation. Estimated pulmonary artery systolic pressure 35 mmHg. IVC is dilated with blunted respiratory response. Estimated RA pressure 10-15 mmHg. No significant change compared to prior study on 08/26/2017  EKG    EKG 09/07/2019: Atrial fibrillation with controlled ventricular response at the rate of 52 bpm, left axis deviation, left anterior fascicular block.  Anteroseptal infarct old.  IVCD, LVH.  Single PVC.   No significant change from 03/08/2019.   Assessment     ICD-10-CM   1. Permanent atrial fibrillation ([place]). CHA2DS2-VASc Score is 4. Yearly risk of stroke: 4% (A, CHF, Vasc Dz).     I48.21 EKG 12-Lead  2. Coronary artery calcification seen on CAT scan  I25.10 rosuvastatin (CRESTOR) 5 MG tablet  3. Nonischemic cardiomyopathy ([facility])  I42.8   4. Chronic combined systolic and diastolic CHF (congestive heart failure) ([facility])  I50.42   5. Tobacco use disorder  F17.200   6. Centrilobular emphysema ([place])   J43.2     Meds ordered this encounter  Medications  . rosuvastatin (CRESTOR) 5 MG tablet    Sig: Take 1 tablet (5 mg total) by mouth daily.    Dispense:  90 tablet    Refill:  3    There are no discontinued medications.  Recommendations:   Mario Proctor  is a 79 y.o. Caucasian male patient  with nonischemic cardiomyopathy by coronary angiogram on 11/02/2016 revealing ejection fraction of 30-35%, ongoning tobacco use disorder, and permanent A fib. He is asymptomatic in regards to his A fib and is now on  anticoagulation.   Patient is here on a 52-month office visit and follow-up for A. fib and nonischemic cardiomyopathy.  In regards to atrial fibrillation, he remains rate controlled.  He is on Xarelto for anticoagulation and tolerates this well.   He has severe underlying emphysema, also coronary calcification was noted on the CT scan recently.  I have started him on Crestor 5 mg daily although his lipids are normal.  I reviewed all his labs, renal  function is remained stable, CBC is also normal along with TSH.  Copies of the labs sent to the PCP.  He is without evidence of decompensated heart failure.  His EF has remained stable.  I have not ordered repeat echocardiogram today as he is symptomatically doing well, but may consider this at his next appointment.  Unfortunately, he does continue to smoke.  He is currently smoking half a pack per day.  We have had multiple conversations regarding complete smoking cessation.  He is not interested in medications to help him quit and wants to continue to try to work on this on his own.  We will plan to see him back in 6 months or sooner if needed.  [name], MD, [facility] 09/07/2019, 9:40 AM [facility] [place] Office: [phone]

## 2019-09-05 LAB — CBC
Hematocrit: 42.9 % (ref 37.5–51.0)
Hemoglobin: 15.3 g/dL (ref 13.0–17.7)
MCH: 32.5 pg (ref 26.6–33.0)
MCHC: 35.7 g/dL (ref 31.5–35.7)
MCV: 91 fL (ref 79–97)
Platelets: 209 10*3/uL (ref 150–450)
RBC: 4.71 x10E6/uL (ref 4.14–5.80)
RDW: 11.8 % (ref 11.6–15.4)
WBC: 6.8 10*3/uL (ref 3.4–10.8)

## 2019-09-05 LAB — BASIC METABOLIC PANEL
BUN/Creatinine Ratio: 13 (ref 10–24)
BUN: 14 mg/dL (ref 8–27)
CO2: 24 mmol/L (ref 20–29)
Calcium: 8.7 mg/dL (ref 8.6–10.2)
Chloride: 96 mmol/L (ref 96–106)
Creatinine, Ser: 1.05 mg/dL (ref 0.76–1.27)
GFR calc Af Amer: 79 mL/min/{1.73_m2} (ref >59)
GFR calc non Af Amer: 68 mL/min/{1.73_m2} (ref >59)
Glucose: 85 mg/dL (ref 65–99)
Potassium: 5.1 mmol/L (ref 3.5–5.2)
Sodium: 134 mmol/L (ref 134–144)

## 2019-09-06 ENCOUNTER — Ambulatory Visit: Payer: Medicare HMO | Admitting: Cardiology

## 2019-09-07 ENCOUNTER — Other Ambulatory Visit: Payer: Self-pay

## 2019-09-07 ENCOUNTER — Ambulatory Visit: Payer: Medicare HMO | Admitting: Cardiology

## 2019-09-07 ENCOUNTER — Encounter: Payer: Self-pay | Admitting: Cardiology

## 2019-09-07 VITALS — BP 132/70 | HR 55 | Temp 97.2°F | Resp 15 | Ht 71.0 in | Wt 194.0 lb

## 2019-09-07 DIAGNOSIS — F17200 Nicotine dependence, unspecified, uncomplicated: Secondary | ICD-10-CM

## 2019-09-07 DIAGNOSIS — I4821 Permanent atrial fibrillation: Secondary | ICD-10-CM

## 2019-09-07 DIAGNOSIS — I428 Other cardiomyopathies: Secondary | ICD-10-CM

## 2019-09-07 DIAGNOSIS — I5042 Chronic combined systolic (congestive) and diastolic (congestive) heart failure: Secondary | ICD-10-CM | POA: Diagnosis not present

## 2019-09-07 DIAGNOSIS — J432 Centrilobular emphysema: Secondary | ICD-10-CM

## 2019-09-07 DIAGNOSIS — R69 Illness, unspecified: Secondary | ICD-10-CM | POA: Diagnosis not present

## 2019-09-07 DIAGNOSIS — I2510 Atherosclerotic heart disease of native coronary artery without angina pectoris: Secondary | ICD-10-CM

## 2019-09-07 DIAGNOSIS — I482 Chronic atrial fibrillation, unspecified: Secondary | ICD-10-CM | POA: Diagnosis not present

## 2019-09-07 MED ORDER — ROSUVASTATIN CALCIUM 5 MG PO TABS: 5.0000 mg | ORAL_TABLET | Freq: Every day | ORAL | 3 refills | Status: DC

## 2019-10-31 ENCOUNTER — Other Ambulatory Visit: Payer: Self-pay | Admitting: Cardiology

## 2019-11-16 ENCOUNTER — Other Ambulatory Visit: Payer: Self-pay | Admitting: Cardiology

## 2019-12-01 ENCOUNTER — Other Ambulatory Visit: Payer: Self-pay | Admitting: Cardiology

## 2020-01-02 ENCOUNTER — Encounter: Payer: Self-pay | Admitting: Cardiology

## 2020-01-17 DIAGNOSIS — R69 Illness, unspecified: Secondary | ICD-10-CM | POA: Diagnosis not present

## 2020-01-25 ENCOUNTER — Other Ambulatory Visit: Payer: Self-pay | Admitting: Cardiology

## 2020-01-25 ENCOUNTER — Other Ambulatory Visit: Payer: Self-pay | Admitting: Pulmonary Disease

## 2020-03-03 ENCOUNTER — Other Ambulatory Visit: Payer: Self-pay | Admitting: Pulmonary Disease

## 2020-03-07 ENCOUNTER — Other Ambulatory Visit: Payer: Self-pay

## 2020-03-07 ENCOUNTER — Encounter: Payer: Self-pay | Admitting: Cardiology

## 2020-03-07 ENCOUNTER — Ambulatory Visit: Payer: Medicare HMO | Admitting: Cardiology

## 2020-03-07 VITALS — BP 136/68 | HR 66 | Resp 16 | Ht 71.0 in | Wt 161.0 lb

## 2020-03-07 DIAGNOSIS — I2510 Atherosclerotic heart disease of native coronary artery without angina pectoris: Secondary | ICD-10-CM

## 2020-03-07 DIAGNOSIS — I428 Other cardiomyopathies: Secondary | ICD-10-CM

## 2020-03-07 DIAGNOSIS — F17200 Nicotine dependence, unspecified, uncomplicated: Secondary | ICD-10-CM

## 2020-03-07 DIAGNOSIS — I5042 Chronic combined systolic (congestive) and diastolic (congestive) heart failure: Secondary | ICD-10-CM

## 2020-03-07 DIAGNOSIS — Z23 Encounter for immunization: Secondary | ICD-10-CM | POA: Diagnosis not present

## 2020-03-07 DIAGNOSIS — I4821 Permanent atrial fibrillation: Secondary | ICD-10-CM

## 2020-03-07 DIAGNOSIS — R69 Illness, unspecified: Secondary | ICD-10-CM | POA: Diagnosis not present

## 2020-03-07 MED ORDER — ENTRESTO 97-103 MG PO TABS: 1.0000 | ORAL_TABLET | Freq: Every day | ORAL | 6 refills | Status: DC

## 2020-03-07 NOTE — Progress Notes (Signed)
Primary Physician/Referring:  Tamsen Roers, MD  Patient ID: Mario Proctor, male    DOB: Mar 30, 1941, 79 y.o.   MRN: [id number]  Chief Complaint  Patient presents with  . Atrial Fibrillation  . Congestive Heart Failure  . Follow-up    6 month   HPI:    Mario Proctor  is a 79 y.o. Caucasian male patient  with nonischemic cardiomyopathy by coronary angiogram on 11/02/2016 revealing ejection fraction of 30-35%, ongoning tobacco use disorder, and permanent A fib. He is asymptomatic in regards to his A fib and is now on anticoagulation.   Patient presents for 25-month follow-up of atrial fibrillation and heart failure.  He reports continued dyspnea on exertion which is chronic and stable.  Otherwise he remains asymptomatic.  Denies chest pain, palpitations, PND, orthopnea, leg edema.  He does not monitor his blood pressure at home.  He is currently tolerating Xarelto without bleeding diathesis.  Unfortunately he continues to smoke approximately half a pack per day.  He does not have formal exercise routine, however states he remains active caring for his grandchildren 3 days a week and mowing his yard, without issue.  Of note he does report occasional episodes where he loses his appetite for 1 to 2 days.  He is able to eat without problem during these episodes, no changes in bowel movements.  Past Medical History:  Diagnosis Date  . A-fib ([place])   . Allergy   . CHF (congestive heart failure) ([place])   . Dupuytren contracture    right sm finger  . Myocardial infarction [facility])    Past Surgical History:  Procedure Laterality Date  . COLON SURGERY     bowel blockage  . DUPUYTREN CONTRACTURE RELEASE Left 2011  . FASCIECTOMY Right 05/02/2015   Procedure: FASCIECTOMY RIGHT SMALL FINGER;  Surgeon: Daryll Brod, MD;  Location: [place];  Service: Orthopedics;  Laterality: Right;  axillary block in preop  . LEFT HEART CATH AND CORONARY ANGIOGRAPHY N/A 11/03/2016   Procedure: Left  Heart Cath and Coronary Angiography;  Surgeon: [name], MD;  Location: [place] [facility];  Service: Cardiovascular;  Laterality: N/A;  . TONSILLECTOMY    . TYMPANOPLASTY Right    Family History  Problem Relation Age of Onset  . Cancer Brother        "in heart and lungs"     Social History   Tobacco Use  . Smoking status: Current Every Day Smoker    Packs/day: 1.00    Years: 57.00    Pack years: 57.00    Types: Cigarettes  . Smokeless tobacco: Never Used  Substance Use Topics  . Alcohol use: Yes    Comment: social   Marital Status: Married  ROS  Review of Systems  Constitutional: Positive for decreased appetite and weight loss. Negative for malaise/fatigue and weight gain.  Cardiovascular: Positive for dyspnea on exertion (chronic, stable). Negative for chest pain, claudication, leg swelling, near-syncope, orthopnea, palpitations, paroxysmal nocturnal dyspnea and syncope.  Respiratory: Positive for cough (chronic, stable). Negative for shortness of breath.   Endocrine: Negative for cold intolerance.  Hematologic/Lymphatic: Does not bruise/bleed easily.  Musculoskeletal: Negative for joint swelling.  Gastrointestinal: Negative for abdominal pain, anorexia, change in bowel habit, hematochezia and melena.  Neurological: Negative for dizziness, headaches, light-headedness and weakness.  Psychiatric/Behavioral: Negative for depression and substance abuse.   Objective  Blood pressure 136/68, pulse 66, resp. rate 16, height 5\' 11"  (1.803 m), weight 161 lb (73 kg), SpO2 93 %.  Vitals with BMI 03/07/2020 09/07/2019 07/21/2019  Height 5\' 11"  5\' 11"  5\' 11"   Weight 161 lbs 194 lbs 162 lbs 10 oz  BMI 22.46 62.22 97.98  Systolic 921 194 174  Diastolic 68 70 80  Pulse 66 55 65     Physical Exam Constitutional:      Appearance: He is well-developed.  HENT:     Head: Atraumatic.  Eyes:     Conjunctiva/sclera: Conjunctivae normal.  Neck:     Thyroid: No thyromegaly.     Vascular:  No JVD.  Cardiovascular:     Rate and Rhythm: Normal rate. Rhythm irregular.     Pulses: Intact distal pulses.          Carotid pulses are 2+ on the right side and 2+ on the left side.      Radial pulses are 2+ on the right side and 2+ on the left side.       Femoral pulses are 2+ on the right side and 2+ on the left side.      Popliteal pulses are 2+ on the right side and 2+ on the left side.       Dorsalis pedis pulses are 1+ on the right side and 1+ on the left side.       Posterior tibial pulses are 1+ on the right side and 1+ on the left side.     Heart sounds: Heart sounds are distant. No murmur heard.  No gallop.      Comments: No JVD, no leg edema.  Pulmonary:     Effort: Pulmonary effort is normal. No accessory muscle usage or respiratory distress.     Breath sounds: Examination of the right-upper field reveals wheezing. Examination of the left-upper field reveals wheezing. Wheezing and rhonchi present.  Abdominal:     General: Bowel sounds are normal.     Palpations: Abdomen is soft.  Musculoskeletal:        General: Normal range of motion.     Cervical back: Neck supple.  Skin:    General: Skin is warm and dry.  Neurological:     Mental Status: He is alert.    Laboratory examination:   Recent Labs    08/18/19 1149 09/04/19 1030  NA 133* 134  K 4.5 5.1  CL 97* 96  CO2 28 24  GLUCOSE 121* 85  BUN 15 14  CREATININE 1.16 1.05  CALCIUM 8.7* 8.7  GFRNONAA >60 68  GFRAA >60 79   CrCl cannot be calculated (Patient's most recent lab result is older than the maximum 21 days allowed.).  CMP Latest Ref Rng & Units 09/04/2019 08/18/2019 09/01/2018  Glucose 65 - 99 mg/dL 85 121(H) 90  BUN 8 - 27 mg/dL 14 15 15   Creatinine 0.76 - 1.27 mg/dL 1.05 1.16 1.13  Sodium 134 - 144 mmol/L 134 133(L) 140  Potassium 3.5 - 5.2 mmol/L 5.1 4.5 4.8  Chloride 96 - 106 mmol/L 96 97(L) 99  CO2 20 - 29 mmol/L 24 28 26   Calcium 8.6 - 10.2 mg/dL 8.7 8.7(L) 9.0  Total Protein 6.0 - 8.5 g/dL  - - 6.7  Total Bilirubin 0.0 - 1.2 mg/dL - - 0.6  Alkaline Phos 39 - 117 IU/L - - 87  AST 0 - 40 IU/L - - 19  ALT 0 - 44 IU/L - - 12   CBC Latest Ref Rng & Units 09/04/2019 09/01/2018 04/12/2018  WBC 3.4 - 10.8 x10E3/uL 6.8 6.7 6.7  Hemoglobin 13.0 -  17.7 g/dL 15.3 15.6 16.1  Hematocrit 37.5 - 51.0 % 42.9 45.9 46.8  Platelets 150 - 450 x10E3/uL 209 241 263.0   Lipid Panel     Component Value Date/Time   CHOL 140 09/01/2018 0803   TRIG 54 09/01/2018 0803   HDL 62 09/01/2018 0803   LDLCALC 67 09/01/2018 0803   External labs: None  Medications and allergies   Allergies  Allergen Reactions  . Codeine Itching  . Oxycodone Rash     Current Outpatient Medications  Medication Instructions  . albuterol (ACCUNEB) 1.25 MG/3ML nebulizer solution 1 ampule, Every 4 hours PRN  . albuterol (VENTOLIN HFA) 108 (90 Base) MCG/ACT inhaler As needed  . budesonide (PULMICORT) 0.5 mg, Nebulization, As needed  . ENTRESTO 97-103 MG 1 tablet, Oral, Daily  . Fluticasone-Umeclidin-Vilant (TRELEGY ELLIPTA) 100-62.5-25 MCG/INH AEPB Inhalation  . metoprolol succinate (TOPROL-XL) 50 MG 24 hr tablet TAKE 1 TABLET BY MOUTH EVERY DAY  . rosuvastatin (CRESTOR) 5 mg, Oral, Daily  . TRELEGY ELLIPTA 100-62.5-25 MCG/INH AEPB INHALE 1 PUFF INTO THE LUNGS EVERY DAY AS NEEDED  . XARELTO 20 MG TABS tablet TAKE 1 TABLET BY MOUTH EVERY DAY WITH SUPPER   Radiology:   Low-dose CT chest 08/16/2019: Coronary and aortic atherosclerosis and calcification.  Severe centrilobular emphysema.  2 benign looking nodules.  Benign right upper kidney mass.  Cardiac Studies:   Lexiscan myoview stress test 09/18/2016: 1. The resting electrocardiogram demonstrated normal sinus rhythm, normal resting conduction, no resting arrhythmias and normal rest repolarization. Stress EKG is non-diagnostic for ischemia as it a pharmacologic stress using Lexiscan. Stress symptoms included dyspnea. Occasional PVC noted. 2. The LV is dilated both at  rest and stress images. The LV end diastolic volume was 628ZM. SPECT images demonstrate Medium perfusion abnormality of moderate intensity in the basal inferior, mid inferior and apical inferior myocardial wall(s) on the stress images. The defect remains relatively unchanged between rest and stress images and is a soft tissue attenuation artifact, however scar in this region without ischemia cannot be completely excluded. The left ventricular ejection fraction was calculated or visually estimated to be 25% with global hypokinesis. High risk study.  Abdominal aortic duplex 10/06/2016: Diffuse plaque noted in the proximal, mid and distal aorta. No AAA observed.  Coronary angiogram 11/03/2016: Severe left ventricular systolic dysfunction. The left ventricular ejection fraction is 30-35% by visual estimate with inferior wall akinesis.There is no mitral valve regurgitation. Normal LVEDP. Normal coronary arteries.  Holter Monitor 48 hours 09/07/2017: Minimum heart rate 38 bpm at 12:13 AM maximum heart rate 154 bpm at 12:59 PM. PVCs consisted of 3600 beats, 1.5% burden. Occasional ventricular and triplets, 194 couplets.. Bigeminy and trigeminy. 2 episodes of 4 beat 3 beat NSVT. Predominant rhythm was atrial fibrillation.  Echocardiogram 02/08/2018: Left ventricle cavity is mildly dilated. Mild concentric hypertrophy of the left ventricle. Moderate decrease in global wall motion. Doppler evidence of grade I (impaired) diastolic dysfunction, normal LAP. Calculated EF 37%. Mild (Grade I) mitral regurgitation. Mild tricuspid regurgitation. Estimated pulmonary artery systolic pressure 35 mmHg. IVC is dilated with blunted respiratory response. Estimated RA pressure 10-15 mmHg. No significant change compared to prior study on 08/26/2017  EKG:   EKG 03/07/2020: Atrial fibrillation with controlled ventricular response at a rate of 61 beats per with ectopic ventricular beats (2).  Left axis deviation, left  anterior fascicular block.  Left ventricular hypertrophy. Poor R wave progression, cannot exclude anterior septal infarct old.  Compared to EKG 09/07/2019, no significant change  Assessment  ICD-10-CM   1. Permanent atrial fibrillation (HCC)  I48.21 EKG 12-Lead    PCV ECHOCARDIOGRAM COMPLETE  2. Chronic combined systolic and diastolic CHF (congestive heart failure) (HCC)  I50.42 ENTRESTO 97-103 MG    PCV ECHOCARDIOGRAM COMPLETE  3. Nonischemic cardiomyopathy (HCC)  I42.8 ENTRESTO 97-103 MG    PCV ECHOCARDIOGRAM COMPLETE  4. Coronary artery calcification seen on CAT scan  I25.10   5. Tobacco use disorder  F17.200     Meds ordered this encounter  Medications  . ENTRESTO 97-103 MG    Sig: Take 1 tablet by mouth daily.    Dispense:  60 tablet    Refill:  6    Medications Discontinued During This Encounter  Medication Reason  . albuterol (VENTOLIN HFA) 108 (90 Base) MCG/ACT inhaler Patient Preference  . ENTRESTO 97-103 MG Reorder    Recommendations:   LAQUINCY EASTRIDGE  is a 79 y.o. Caucasian male patient  with nonischemic cardiomyopathy by coronary angiogram on 11/02/2016 revealing ejection fraction of 30-35%, ongoning tobacco use disorder, and permanent A fib. He is asymptomatic in regards to his A fib and is now on anticoagulation.   Patient presents for 26-month follow-up of atrial fibrillation and nonischemic cardiomyopathy.  He remains well rate controlled we will continue metoprolol succinate 50 mg daily.  He is tolerating oral anticoagulation with Xarelto 20 mg daily without bleeding diathesis, will continue this.  Patient is also tolerating rosuvastatin 5 mg daily well, which was started due to coronary calcification noted on CT scan.  Reviewed prior labs, lipids are well controlled although he has not had lipid panel done since starting rosuvastatin.  Could consider lipid profile testing at next visit.  There are no clinical signs of decompensated heart failure at this time, and he  remained stable and asymptomatic.  We will continue Entresto 97/103, patient has only been able to tolerate 1 dose per day so we will continue this.  Last echocardiogram was October 2019, will obtain repeat echocardiogram at this time.  Patient continues to smoke about half a pack per day.  Educated him again on the importance of smoking cessation and offered medications to assist him in quitting.  Patient prefers to work on quitting independently, encouraged him to let us know if he changes his mind and would like medication assistance.  Of note patient's blood pressure was initially elevated in the office, however it did improve upon recheck.  Encourage patient to monitor his blood pressure at home regularly until it is our office know if it remains consistently greater than 937 systolic.  In regard to patient's symptoms of decreased appetite encouraged him to schedule appointment with primary care provider.  He states he has not seen PCP in over a year, encouraged him to schedule a routine visit.  Follow-up in 6 months for A. fib and nonischemic cardiomyopathy.  Patient was seen in collaboration with Dr. Einar Gip. He also reviewed patient's chart and  Dr. Einar Gip is in agreement of the plan.    Mario Berthold, PA-C 03/07/2020, 10:04 AM Office: [phone]

## 2020-03-08 ENCOUNTER — Ambulatory Visit: Payer: Medicare HMO | Admitting: Cardiology

## 2020-03-12 DIAGNOSIS — L400 Psoriasis vulgaris: Secondary | ICD-10-CM | POA: Diagnosis not present

## 2020-03-12 DIAGNOSIS — M25521 Pain in right elbow: Secondary | ICD-10-CM | POA: Diagnosis not present

## 2020-03-12 DIAGNOSIS — M25421 Effusion, right elbow: Secondary | ICD-10-CM | POA: Diagnosis not present

## 2020-03-15 ENCOUNTER — Ambulatory Visit: Payer: Medicare HMO

## 2020-03-15 ENCOUNTER — Other Ambulatory Visit: Payer: Self-pay

## 2020-03-15 DIAGNOSIS — I4821 Permanent atrial fibrillation: Secondary | ICD-10-CM

## 2020-03-15 DIAGNOSIS — I428 Other cardiomyopathies: Secondary | ICD-10-CM | POA: Diagnosis not present

## 2020-03-15 DIAGNOSIS — I5042 Chronic combined systolic (congestive) and diastolic (congestive) heart failure: Secondary | ICD-10-CM | POA: Diagnosis not present

## 2020-03-19 ENCOUNTER — Telehealth: Payer: Self-pay | Admitting: Student

## 2020-03-19 DIAGNOSIS — Z23 Encounter for immunization: Secondary | ICD-10-CM | POA: Diagnosis not present

## 2020-03-19 DIAGNOSIS — I5042 Chronic combined systolic (congestive) and diastolic (congestive) heart failure: Secondary | ICD-10-CM

## 2020-03-19 DIAGNOSIS — I428 Other cardiomyopathies: Secondary | ICD-10-CM

## 2020-03-19 MED ORDER — SPIRONOLACTONE 25 MG PO TABS: 12.5000 mg | ORAL_TABLET | Freq: Every day | ORAL | 3 refills | Status: DC

## 2020-03-19 NOTE — Telephone Encounter (Signed)
Called patient regarding results of recent echocardiogram.  Explained to him ejection fraction has decreased further from 35% to 25%.  As renal function is stable and potassium is within normal limits, will start patient on spironolactone 12.5 mg daily.  Will obtain BMP 1 week after medication initiation.  We will schedule follow-up appointment in 3 weeks.  Patient verbalized understanding and agreement.  Cautioned him to monitor for symptoms of hypotension.  1. Chronic combined systolic and diastolic CHF (congestive heart failure) (HCC) - CMP14+EGFR; Future - CMP14+EGFR  2. Nonischemic cardiomyopathy ([place]) - CMP14+EGFR; Future - CMP14+EGFR

## 2020-03-19 NOTE — Progress Notes (Signed)
Called patient and discussed results of echocardiogram.

## 2020-03-27 DIAGNOSIS — Z23 Encounter for immunization: Secondary | ICD-10-CM | POA: Diagnosis not present

## 2020-03-27 DIAGNOSIS — I5042 Chronic combined systolic (congestive) and diastolic (congestive) heart failure: Secondary | ICD-10-CM | POA: Diagnosis not present

## 2020-03-27 DIAGNOSIS — I428 Other cardiomyopathies: Secondary | ICD-10-CM | POA: Diagnosis not present

## 2020-03-28 LAB — CMP14+EGFR
ALT: 19 IU/L (ref 0–44)
AST: 20 IU/L (ref 0–40)
Albumin/Globulin Ratio: 1.5 (ref 1.2–2.2)
Albumin: 3.9 g/dL (ref 3.7–4.7)
Alkaline Phosphatase: 134 IU/L — ABNORMAL HIGH (ref 44–121)
BUN/Creatinine Ratio: 15 (ref 10–24)
BUN: 14 mg/dL (ref 8–27)
Bilirubin Total: 0.7 mg/dL (ref 0.0–1.2)
CO2: 24 mmol/L (ref 20–29)
Calcium: 9 mg/dL (ref 8.6–10.2)
Chloride: 95 mmol/L — ABNORMAL LOW (ref 96–106)
Creatinine, Ser: 0.94 mg/dL (ref 0.76–1.27)
GFR calc Af Amer: 89 mL/min/{1.73_m2} (ref >59)
GFR calc non Af Amer: 77 mL/min/{1.73_m2} (ref >59)
Globulin, Total: 2.6 g/dL (ref 1.5–4.5)
Glucose: 87 mg/dL (ref 65–99)
Potassium: 4.8 mmol/L (ref 3.5–5.2)
Sodium: 132 mmol/L — ABNORMAL LOW (ref 134–144)
Total Protein: 6.5 g/dL (ref 6.0–8.5)

## 2020-04-01 NOTE — Telephone Encounter (Signed)
Spoke to patient he is aware

## 2020-04-01 NOTE — Telephone Encounter (Signed)
Please inform patient renal function is stable. Sodium is a little low, we will monitor as needed. Please continue spironolactone.

## 2020-04-11 DIAGNOSIS — Z23 Encounter for immunization: Secondary | ICD-10-CM | POA: Diagnosis not present

## 2020-04-13 DIAGNOSIS — Z23 Encounter for immunization: Secondary | ICD-10-CM | POA: Diagnosis not present

## 2020-04-24 DIAGNOSIS — Z23 Encounter for immunization: Secondary | ICD-10-CM | POA: Diagnosis not present

## 2020-04-30 DIAGNOSIS — Z1159 Encounter for screening for other viral diseases: Secondary | ICD-10-CM | POA: Diagnosis not present

## 2020-05-01 DIAGNOSIS — Z23 Encounter for immunization: Secondary | ICD-10-CM | POA: Diagnosis not present

## 2020-05-13 NOTE — Progress Notes (Signed)
@Patient  ID: Mario Proctor, male    DOB: 12-11-1940, 80 y.o.   MRN: [id number]  Chief Complaint  Patient presents with  . Follow-up    Pt states he was having complaints of increased SOB and feeling more tired which lasted about 2 weeks but he states he is feeling better now. Pt does have an occ cough.    Referring provider: Tamsen Roers, MD  HPI: 80 year old male, current everyday smoker (57-pack-year history).  Past medical history significant for COPD Gold A, dyspnea on exertion, A. fib, cardiomyopathy, chronic combined systolic and diastolic heart failure.  Patient of Dr. Vaughan Browner, last seen on 07/21/2019.  He saw pulmonary nurse practitioner for lung cancer screening on 08/16/2019.  Previous [facility] pulmonary encounter: Mario Proctor is a 80 year old with active smoking history, allergies, COPD GOLD A (CAT score 9, no exacerbations), atrial fibrillation.  He has symptoms of dyspnea on exertion for the past several years. He gets short of breath with excessive physical activity but is able to carry out his normal daily activities. He is able to climb a flight of stairs, walk to his mailbox. 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 He has chronic cough with minimal sputum production. No wheezing, hemoptysis, fevers, chills. He had been on albuterol many years ago and is currently not using any inhaler medication.   He was evaluated by Dr. Einar Gip for frequent PVCs. He was unable to complete the treadmill stress test due to marked dyspnea and PVCs. An echocardiogram and a pharmacologic stress test has been done. Cardiac cath shows nonischemic cardiomyopathy. Started on Xarelto by Dr. Einar Gip for new onset atrial fibrillation.  Pets: Cats, dog. No birds, exotic pets, farm animals Occupation: Worked as a Geophysicist/field seismologist for [employer]. Exposures: Has some exposure to lead in his line of work. Denies exposure to asbestos, dust, etc., Smoking history: 54-pack-year smoking history. Continues to  smoke 1 pack per day  07/21/19 Bevespi changed to Trelegy inhaler due to elevated peripheral eosinophils.  He likes the Trelegy and feels that it helps a lot but is using it only intermittently due to cost of medication Continues to have dyspnea on exertion, chronic cough.   05/14/2020- Interim History Presents today for an acute visit, reports increased shortness of breath and fatigue.  Covid negative week of Christmas. He is doing well today, no real acute complaints. He reports having no energy several weeks ago. This has improved. His symptoms felt similar to when he had pneumonia in the past. He did not have a cough, he may have had some sinus symptoms but again this has resolved. His breathing is baseline for him. He is on Trelegy Elipta 100 and states that this helps a lot. He uses albuterol on rare occasion 1-2 times a day. Reports not needed SABA as much as he has in the past. He has not required it today. Denies f/c/s, significant shortness of breath, cough, chest tightness, wheezing, N/V/D.   LDCT on 08/16/2019 showed lung RADS 2, benign appearance of behavior.  There was a exophytic 1.6 cm indeterminate upper right renal cortical lesion, renal cell carcinoma cannot be excluded.  Patient was ordered for dedicated CT abdomen with and without contrast which ended up showing benign proteinaceous or hemorrhagic cyst.  No further routine follow-up required.   Patient had echocardiogram in November of this year that showed EF 20 to 83%, grade 1 diastolic dysfunction, dilated cardiomyopathy and moderate pulmonary hypertension.  He is on spironolactone 12.5  mg daily.  Patient saw Dr. Einar Gip with cardiology on 03/07/2020.  He is on Entresto 97/103, metoprolol succinate 50 mg daily and anticoagulated with Xarelto 20 mg daily.   Pulmonary testing: PFTs 01/07/2017 - FVC 3.96 (89%), FEV1 1.86 (58%), ratio 47, TLC 113%, DLCO cor 17.71 (52%)  Cardiac testing: Echocardiogram showed severely depressed LV  systolic function with EF 20 to 25%, dilated cardiomyopathy, mild left ventricular hypertrophy, grade 1 diastolic dysfunction, moderate pulmonary hypertension    Allergies  Allergen Reactions  . Codeine Itching  . Oxycodone Rash    Immunization History  Administered Date(s) Administered  . Fluad Quad(high Dose 65+) 02/23/2019  . Influenza, High Dose Seasonal PF 02/15/2016, 04/16/2017, 04/04/2018, 03/11/2020  . PFIZER SARS-COV-2 Vaccination 06/15/2019, 07/10/2019, 04/02/2020  . Pneumococcal-Unspecified 10/16/2010    Past Medical History:  Diagnosis Date  . A-fib ([place])   . Allergy   . CHF (congestive heart failure) ([place])   . Dupuytren contracture    right sm finger  . Myocardial infarction ([place])     Tobacco History: Social History   Tobacco Use  Smoking Status Current Every Day Smoker  . Packs/day: 1.00  . Years: 57.00  . Pack years: 57.00  . Types: Cigarettes  Smokeless Tobacco Never Used  Tobacco Comment   currently smoking .5ppd as of 05/14/20   Ready to quit: Not Answered Counseling given: Not Answered Comment: currently smoking .5ppd as of 05/14/20   Outpatient Medications Prior to Visit  Medication Sig Dispense Refill  . albuterol (ACCUNEB) 1.25 MG/3ML nebulizer solution 1 ampule every 4 (four) hours as needed.     Marland Kitchen albuterol (VENTOLIN HFA) 108 (90 Base) MCG/ACT inhaler as needed.    . budesonide (PULMICORT) 0.5 MG/2ML nebulizer solution Take 0.5 mg by nebulization as needed.     Marland Kitchen ENTRESTO 97-103 MG Take 1 tablet by mouth daily. 60 tablet 6  . metoprolol succinate (TOPROL-XL) 50 MG 24 hr tablet TAKE 1 TABLET BY MOUTH EVERY DAY 90 tablet 1  . rosuvastatin (CRESTOR) 5 MG tablet Take 1 tablet (5 mg total) by mouth daily. 90 tablet 3  . spironolactone (ALDACTONE) 25 MG tablet Take 0.5 tablets (12.5 mg total) by mouth daily. 15 tablet 3  . TRELEGY ELLIPTA 100-62.5-25 MCG/INH AEPB INHALE 1 PUFF INTO THE LUNGS EVERY DAY AS NEEDED 60 each 3  . XARELTO 20 MG TABS  tablet TAKE 1 TABLET BY MOUTH EVERY DAY WITH SUPPER 90 tablet 0  . Fluticasone-Umeclidin-Vilant (TRELEGY ELLIPTA) 100-62.5-25 MCG/INH AEPB Inhale into the lungs.     No facility-administered medications prior to visit.   Review of Systems  Review of Systems  Constitutional: Negative.   Respiratory: Negative for cough, chest tightness and shortness of breath.   Cardiovascular: Negative.    Physical Exam  BP 132/80 (BP Location: Left Arm, Cuff Size: Normal)   Pulse 60   Ht 5\' 11"  (1.803 m)   Wt 162 lb 12.8 oz (73.8 kg)   SpO2 98%   BMI 22.71 kg/m  Physical Exam Constitutional:      Appearance: Normal appearance.  HENT:     Head: Normocephalic and atraumatic.     Mouth/Throat:     Mouth: Mucous membranes are moist.     Pharynx: Oropharynx is clear.  Cardiovascular:     Rate and Rhythm: Normal rate and regular rhythm.  Pulmonary:     Effort: Pulmonary effort is normal.     Breath sounds: Normal breath sounds. No wheezing, rhonchi or rales.  Musculoskeletal:  General: Normal range of motion.  Skin:    General: Skin is warm and dry.  Neurological:     General: No focal deficit present.     Mental Status: He is alert and oriented to person, place, and time. Mental status is at baseline.  Psychiatric:        Mood and Affect: Mood normal.        Behavior: Behavior normal.        Thought Content: Thought content normal.        Judgment: Judgment normal.      Lab Results:  CBC    Component Value Date/Time   WBC 6.8 09/04/2019 1030   WBC 6.7 04/12/2018 1001   RBC 4.71 09/04/2019 1030   RBC 4.92 04/12/2018 1001   HGB 15.3 09/04/2019 1030   HCT 42.9 09/04/2019 1030   PLT 209 09/04/2019 1030   MCV 91 09/04/2019 1030   MCH 32.5 09/04/2019 1030   MCHC 35.7 09/04/2019 1030   MCHC 34.4 04/12/2018 1001   RDW 11.8 09/04/2019 1030   LYMPHSABS 2.0 09/01/2018 0803   MONOABS 0.7 04/12/2018 1001   EOSABS 0.6 (H) 09/01/2018 0803   BASOSABS 0.1 09/01/2018 0803     BMET    Component Value Date/Time   NA 132 (L) 03/27/2020 1001   K 4.8 03/27/2020 1001   CL 95 (L) 03/27/2020 1001   CO2 24 03/27/2020 1001   GLUCOSE 87 03/27/2020 1001   GLUCOSE 121 (H) 08/18/2019 1149   BUN 14 03/27/2020 1001   CREATININE 0.94 03/27/2020 1001   CALCIUM 9.0 03/27/2020 1001   GFRNONAA 77 03/27/2020 1001   GFRAA 89 03/27/2020 1001    BNP No results found for: BNP  ProBNP No results found for: PROBNP  Imaging: No results found.   Assessment & Plan:   COPD, group A, by GOLD 2017 classification ([place]) - Patient had possible viral infection or copd exacerbation 1-2 weeks ago, he is feeling better. No acute complaints. He had no cough. He has some residual fatigue and dyspnea. We will check CXR and BNP d/t his hx of heart failure. Continue Trelegy 100 one puff daily and prn albuterol 2 puffs q 6 hours for breakthrough shortness of breath/wheezing. FU in 3 months or sooner if symptoms return.   Tobacco use disorder - Continue to encourage smoking cessation  - LDCT on 08/16/2019 showed lung RADS 2, benign appearance of behavior.  There was a exophytic 1.6 cm indeterminate upper right renal cortical lesion, a dedicated CT abdomen with and without contrast ended up showing benign proteinaceous or hemorrhagic cyst.  No further routine follow-up required.    Martyn Ehrich, NP 05/14/2020

## 2020-05-14 ENCOUNTER — Ambulatory Visit (INDEPENDENT_AMBULATORY_CARE_PROVIDER_SITE_OTHER): Payer: Medicare HMO

## 2020-05-14 ENCOUNTER — Ambulatory Visit: Payer: Medicare HMO | Admitting: Primary Care

## 2020-05-14 ENCOUNTER — Other Ambulatory Visit: Payer: Self-pay

## 2020-05-14 ENCOUNTER — Encounter: Payer: Self-pay | Admitting: Primary Care

## 2020-05-14 VITALS — BP 132/80 | HR 60 | Ht 71.0 in | Wt 162.8 lb

## 2020-05-14 DIAGNOSIS — J441 Chronic obstructive pulmonary disease with (acute) exacerbation: Secondary | ICD-10-CM | POA: Diagnosis not present

## 2020-05-14 DIAGNOSIS — J449 Chronic obstructive pulmonary disease, unspecified: Secondary | ICD-10-CM

## 2020-05-14 DIAGNOSIS — R0602 Shortness of breath: Secondary | ICD-10-CM

## 2020-05-14 DIAGNOSIS — F17200 Nicotine dependence, unspecified, uncomplicated: Secondary | ICD-10-CM | POA: Diagnosis not present

## 2020-05-14 DIAGNOSIS — R69 Illness, unspecified: Secondary | ICD-10-CM | POA: Diagnosis not present

## 2020-05-14 HISTORY — DX: Chronic obstructive pulmonary disease, unspecified: J44.9

## 2020-05-14 LAB — BRAIN NATRIURETIC PEPTIDE: Pro B Natriuretic peptide (BNP): 900 pg/mL — ABNORMAL HIGH (ref 0.0–100.0)

## 2020-05-14 NOTE — Assessment & Plan Note (Addendum)
-   Continue to encourage smoking cessation  - LDCT on 08/16/2019 showed lung RADS 2, benign appearance of behavior.  There was a exophytic 1.6 cm indeterminate upper right renal cortical lesion, a dedicated CT abdomen with and without contrast ended up showing benign proteinaceous or hemorrhagic cyst.  No further routine follow-up required.

## 2020-05-14 NOTE — Assessment & Plan Note (Addendum)
-   Patient had possible viral infection or copd exacerbation 1-2 weeks ago, he is feeling better. No acute complaints. He had no cough. He has some residual fatigue and dyspnea. We will check CXR and BNP d/t his hx of heart failure. Continue Trelegy 100 one puff daily and prn albuterol 2 puffs q 6 hours for breakthrough shortness of breath/wheezing. FU in 3 months or sooner if symptoms return.

## 2020-05-14 NOTE — Patient Instructions (Addendum)
Pleasure meeting you today Mario Proctor, glad you are feeling some better  Recommendations: Continue Trelegy 1 puff daily in morning Continue Albuterol 2 puff every 6 hours as needed for breakthrough shortness of Breath or wheezing   Orders: CXR and labs today  Follow-up: 3-4 months with Dr. Vaughan Browner or sooner if needed

## 2020-05-15 NOTE — Progress Notes (Signed)
Please let patient know that his BNP (fluid level) was significantly elevated. This is likely d/t heart failure, I would recommend he call his cardiologist to see if he can get a sooner apt than April.   CXR showed a possibly mild pneumonia, this is likely resolving and why he did not feel well several weeks ago. Id recommend we get another CXR in 4 weeks to ensure it has resolved. If respiratory symptoms return or worsen call our office.

## 2020-05-17 ENCOUNTER — Other Ambulatory Visit: Payer: Self-pay | Admitting: *Deleted

## 2020-05-17 DIAGNOSIS — R0602 Shortness of breath: Secondary | ICD-10-CM

## 2020-05-20 NOTE — Progress Notes (Signed)
Primary Physician/Referring:  Tamsen Roers, MD  Patient ID: Mario Proctor, male    DOB: 21-Apr-1941, 80 y.o.   MRN: [id number]  Chief Complaint  Patient presents with  . Atrial Fibrillation  . Cardiomyopathy  . Follow-up    6 month   HPI:    Mario Proctor  is a 80 y.o. Caucasian male patient  with nonischemic cardiomyopathy by coronary angiogram on 11/02/2016 revealing ejection fraction of 30-35%, ongoning tobacco use disorder, and permanent A fib. He is asymptomatic in regards to his A fib and is now on anticoagulation.   Patient presents for follow up after echocardiogram revealed decreased LVEF of 25%. Patient was previously started on spironolactone 12.5 mg daily, renal function remained stable, he now presents for follow up.  Patient reports he has been doing well since his last office visit in October.  Denies chest pain, palpitations, PND, orthopnea, leg edema.  He does continue to have dyspnea on exertion which is chronic and stable.  Patient unfortunately continues to smoke approximately Half a pack of cigarettes per day.  He is tolerating Xarelto without bleeding diathesis.  Of note patient was recently evaluated by pulmonology for COPD, provider there ordered BNP which was elevated at 900 pg/mL.   Past Medical History:  Diagnosis Date  . A-fib ([place])   . Allergy   . CHF (congestive heart failure) ([place])   . Dupuytren contracture    right sm finger  . Myocardial infarction [facility])    Past Surgical History:  Procedure Laterality Date  . COLON SURGERY     bowel blockage  . DUPUYTREN CONTRACTURE RELEASE Left 2011  . FASCIECTOMY Right 05/02/2015   Procedure: FASCIECTOMY RIGHT SMALL FINGER;  Surgeon: Daryll Brod, MD;  Location: [place];  Service: Orthopedics;  Laterality: Right;  axillary block in preop  . LEFT HEART CATH AND CORONARY ANGIOGRAPHY N/A 11/03/2016   Procedure: Left Heart Cath and Coronary Angiography;  Surgeon: [name], MD;  Location: [place] [facility];  Service: Cardiovascular;  Laterality: N/A;  . TONSILLECTOMY    . TYMPANOPLASTY Right    Family History  Problem Relation Age of Onset  . Cancer Brother        "in heart and lungs"     Social History   Tobacco Use  . Smoking status: Current Every Day Smoker    Packs/day: 1.00    Years: 57.00    Pack years: 57.00    Types: Cigarettes  . Smokeless tobacco: Never Used  . Tobacco comment: currently smoking .5ppd as of 05/14/20  Substance Use Topics  . Alcohol use: Yes    Comment: social   Marital Status: Married  ROS  Review of Systems  Constitutional: Negative for malaise/fatigue.  Cardiovascular: Positive for dyspnea on exertion (chronic, stable). Negative for chest pain, claudication, leg swelling, near-syncope, orthopnea, palpitations, paroxysmal nocturnal dyspnea and syncope.  Respiratory: Positive for cough (chronic, stable). Negative for shortness of breath.   Endocrine: Negative for cold intolerance.  Hematologic/Lymphatic: Does not bruise/bleed easily.  Musculoskeletal: Negative for joint swelling.  Gastrointestinal: Negative for abdominal pain, anorexia, change in bowel habit, hematochezia and melena.  Neurological: Negative for dizziness, headaches, light-headedness and weakness.  Psychiatric/Behavioral: Negative for depression and substance abuse.   Objective  Blood pressure 134/76, pulse 65, resp. rate 16, height 5\' 11"  (1.803 m), weight 163 lb 12.8 oz (74.3 kg).  Vitals with BMI 05/21/2020 05/14/2020 03/07/2020  Height 5\' 11"  5\' 11"  5\' 11"   Weight 163  lbs 13 oz 162 lbs 13 oz 161 lbs  BMI 22.86 33.29 51.88  Systolic 416 606 301  Diastolic 76 80 68  Pulse 65 60 66     Physical Exam Constitutional:      Appearance: He is well-developed.  HENT:     Head: Atraumatic.  Eyes:     Conjunctiva/sclera: Conjunctivae normal.  Neck:     Thyroid: No thyromegaly.     Vascular: No JVD.  Cardiovascular:     Rate and Rhythm: Normal rate. Rhythm irregular.      Pulses: Intact distal pulses.          Carotid pulses are 2+ on the right side and 2+ on the left side.      Radial pulses are 2+ on the right side and 2+ on the left side.       Femoral pulses are 2+ on the right side and 2+ on the left side.      Popliteal pulses are 2+ on the right side and 2+ on the left side.       Dorsalis pedis pulses are 1+ on the right side and 1+ on the left side.       Posterior tibial pulses are 1+ on the right side and 1+ on the left side.     Heart sounds: Heart sounds are distant. No murmur heard. No gallop.      Comments: No JVD.  Pulmonary:     Effort: Pulmonary effort is normal. No accessory muscle usage or respiratory distress.     Breath sounds: Examination of the right-upper field reveals wheezing. Examination of the left-upper field reveals wheezing. Wheezing and rhonchi present.  Abdominal:     General: Bowel sounds are normal.     Palpations: Abdomen is soft.  Musculoskeletal:        General: Normal range of motion.     Cervical back: Neck supple.     Right lower leg: Edema (Trace) present.     Left lower leg: Edema (Trace) present.  Skin:    General: Skin is warm and dry.  Neurological:     Mental Status: He is alert.    Laboratory examination:   Recent Labs    08/18/19 1149 09/04/19 1030 03/27/20 1001  NA 133* 134 132*  K 4.5 5.1 4.8  CL 97* 96 95*  CO2 28 24 24   GLUCOSE 121* 85 87  BUN 15 14 14   CREATININE 1.16 1.05 0.94  CALCIUM 8.7* 8.7 9.0  GFRNONAA >60 68 77  GFRAA >60 79 89   CrCl cannot be calculated (Patient's most recent lab result is older than the maximum 21 days allowed.).  CMP Latest Ref Rng & Units 03/27/2020 09/04/2019 08/18/2019  Glucose 65 - 99 mg/dL 87 85 121(H)  BUN 8 - 27 mg/dL 14 14 15   Creatinine 0.76 - 1.27 mg/dL 0.94 1.05 1.16  Sodium 134 - 144 mmol/L 132(L) 134 133(L)  Potassium 3.5 - 5.2 mmol/L 4.8 5.1 4.5  Chloride 96 - 106 mmol/L 95(L) 96 97(L)  CO2 20 - 29 mmol/L 24 24 28   Calcium 8.6 -  10.2 mg/dL 9.0 8.7 8.7(L)  Total Protein 6.0 - 8.5 g/dL 6.5 - -  Total Bilirubin 0.0 - 1.2 mg/dL 0.7 - -  Alkaline Phos 44 - 121 IU/L 134(H) - -  AST 0 - 40 IU/L 20 - -  ALT 0 - 44 IU/L 19 - -   CBC Latest Ref Rng & Units 09/04/2019 09/01/2018 04/12/2018  WBC 3.4 - 10.8 x10E3/uL 6.8 6.7 6.7  Hemoglobin 13.0 - 17.7 g/dL 15.3 15.6 16.1  Hematocrit 37.5 - 51.0 % 42.9 45.9 46.8  Platelets 150 - 450 x10E3/uL 209 241 263.0   Lipid Panel     Component Value Date/Time   CHOL 140 09/01/2018 0803   TRIG 54 09/01/2018 0803   HDL 62 09/01/2018 0803   LDLCALC 67 09/01/2018 0803   External labs: None  Medications and allergies   Allergies  Allergen Reactions  . Codeine Itching  . Oxycodone Rash     Current Outpatient Medications  Medication Instructions  . albuterol (ACCUNEB) 1.25 MG/3ML nebulizer solution 1 ampule, Every 4 hours PRN  . budesonide (PULMICORT) 0.5 mg, Nebulization, As needed  . ENTRESTO 97-103 MG 1 tablet, Oral, Daily  . furosemide (LASIX) 20 mg, Oral, 2 times daily PRN  . metoprolol succinate (TOPROL-XL) 50 MG 24 hr tablet TAKE 1 TABLET BY MOUTH EVERY DAY  . rosuvastatin (CRESTOR) 5 mg, Oral, Daily  . spironolactone (ALDACTONE) 25 mg, Oral, Daily  . TRELEGY ELLIPTA 100-62.5-25 MCG/INH AEPB INHALE 1 PUFF INTO THE LUNGS EVERY DAY AS NEEDED  . XARELTO 20 MG TABS tablet TAKE 1 TABLET BY MOUTH EVERY DAY WITH SUPPER   Radiology:   Low-dose CT chest 08/16/2019: Coronary and aortic atherosclerosis and calcification.  Severe centrilobular emphysema.  2 benign looking nodules.  Benign right upper kidney mass.  Cardiac Studies:   Lexiscan myoview stress test 09/18/2016: 1. The resting electrocardiogram demonstrated normal sinus rhythm, normal resting conduction, no resting arrhythmias and normal rest repolarization. Stress EKG is non-diagnostic for ischemia as it a pharmacologic stress using Lexiscan. Stress symptoms included dyspnea. Occasional PVC noted. 2. The LV is dilated  both at rest and stress images. The LV end diastolic volume was 248GN. SPECT images demonstrate Medium perfusion abnormality of moderate intensity in the basal inferior, mid inferior and apical inferior myocardial wall(s) on the stress images. The defect remains relatively unchanged between rest and stress images and is a soft tissue attenuation artifact, however scar in this region without ischemia cannot be completely excluded. The left ventricular ejection fraction was calculated or visually estimated to be 25% with global hypokinesis. High risk study.  Abdominal aortic duplex 10/06/2016: Diffuse plaque noted in the proximal, mid and distal aorta. No AAA observed.  Coronary angiogram 11/03/2016: Severe left ventricular systolic dysfunction. The left ventricular ejection fraction is 30-35% by visual estimate with inferior wall akinesis.There is no mitral valve regurgitation. Normal LVEDP. Normal coronary arteries.  Holter Monitor 48 hours 09/07/2017: Minimum heart rate 38 bpm at 12:13 AM maximum heart rate 154 bpm at 12:59 PM. PVCs consisted of 3600 beats, 1.5% burden. Occasional ventricular and triplets, 194 couplets.. Bigeminy and trigeminy. 2 episodes of 4 beat 3 beat NSVT. Predominant rhythm was atrial fibrillation.  PCV ECHOCARDIOGRAM COMPLETE 03/15/2020 Severely depressed LV systolic function with visual EF 20-25%. Left ventricle cavity is moderately dilated. Dilated cardiomyopathy. Mild concentric hypertrophy of the left ventricle. Hypokinetic global wall motion. Doppler evidence of grade I (impaired) diastolic dysfunction, elevated LAP. Left atrial cavity is severely dilated at 4.6 cm. Structurally normal mitral valve.  Mild to moderate mitral regurgitation. Mild to moderate tricuspid regurgitation. Moderate pulmonary hypertension. RVSP measures 44 mmHg. CVP estimated at 15 mm Hg. IVC is dilated with poor inspiration collapse consistent with elevated right atrial pressure. Compared to  02/08/2018, EF has further reduced from 35%.     EKG:   EKG 05/21/2020: Atrial fibrillation with controlled ventricular response  at a rate of 58 bpm.  Left axis, left anterior fascicular block.  LVH. Poor R wave progression, cannot exclude anteroseptal infarct old.Compared to EKG 03/07/2020, no ectopic ventricular beats.  EKG 03/07/2020: Atrial fibrillation with controlled ventricular response at a rate of 61 beats per with ectopic ventricular beats (2).  Left axis deviation, left anterior fascicular block.  Left ventricular hypertrophy. Poor R wave progression, cannot exclude anterior septal infarct old.  Compared to EKG 09/07/2019, no significant change  Assessment     ICD-10-CM   1. Chronic combined systolic and diastolic CHF (congestive heart failure) (H[facility])  I50.42 Brain natriuretic peptide    Basic metabolic panel    Lipid Panel With LDL/HDL Ratio  2. Nonischemic cardiomyopathy ([facility])  I42.8   3. Permanent atrial fibrillation ([facility])  I48.21 EKG 12-Lead  4. Coronary artery calcification seen on CAT scan  I25.10 Lipid Panel With LDL/HDL Ratio    Meds ordered this encounter  Medications  . spironolactone (ALDACTONE) 25 MG tablet    Sig: Take 1 tablet (25 mg total) by mouth daily.    Dispense:  90 tablet    Refill:  3  . furosemide (LASIX) 20 MG tablet    Sig: Take 1 tablet (20 mg total) by mouth 2 (two) times daily as needed for fluid (Swelling, shortness of breath).    Dispense:  90 tablet    Refill:  3    Medications Discontinued During This Encounter  Medication Reason  . albuterol (VENTOLIN HFA) 108 (90 Base) MCG/ACT inhaler Duplicate  . spironolactone (ALDACTONE) 25 MG tablet Reorder    Recommendations:   ADRIAN DINOVO  is a 80 y.o. Caucasian male patient  with nonischemic cardiomyopathy by coronary angiogram on 11/02/2016 revealing ejection fraction of 30-35%, ongoning tobacco use disorder, and permanent A fib incidentally found in 2018 by Holter monitor. He is asymptomatic  in regards to his A fib and is now on anticoagulation.   Patient presents for 78-month follow-up of atrial fibrillation after echocardiogram revealed LVEF had further reduced from 35% to 20-25%.  Patient is presently doing well without clinical signs of acute heart failure.  We will increase spironolactone from 12.5 to 25 mg daily and recheck BMP in 1 week.  We will also obtain repeat BNP, as well as lipid profile testing as there are no recent values available.  I have also prescribed furosemide 20 mg daily as needed for volume overload, patient verbalizes understanding of how and when to take as needed Lasix.  Unfortunately patient continues to smoke daily, I have offered smoking cessation assistance including nicotine replacement therapy, however patient prefers to pursue quitting on his own.  Patient will notify our office if he would like prescription sent for nicotine replacement therapy.  Suspect permanent atrial fibrillation may be contributing to worsening systolic heart failure. Will therefore start patient on amiodarone 200 mg twice daily for 3 weeks, if patient remains in atrial fibrillation will then schedule for cardioversion. Although patient remains asymptomatic from atrial fibrillation, his LVEF has now dropped, therefore feel attempt to maintain sinus rhythm is warranted at this point. Amiodarone will likely increase chances of successful cardioversion.   Follow up in 3 months.  Patient was seen in collaboration with Dr. Einar Gip. He also reviewed patient's chart and Dr. Einar Gip is in agreement of the plan.    Alethia Berthold, PA-C 05/24/2020, 3:51 PM Office: [phone]

## 2020-05-21 ENCOUNTER — Other Ambulatory Visit: Payer: Self-pay

## 2020-05-21 ENCOUNTER — Ambulatory Visit: Payer: Medicare HMO | Admitting: Student

## 2020-05-21 ENCOUNTER — Encounter: Payer: Self-pay | Admitting: Student

## 2020-05-21 VITALS — BP 134/76 | HR 65 | Resp 16 | Ht 71.0 in | Wt 163.8 lb

## 2020-05-21 DIAGNOSIS — I2510 Atherosclerotic heart disease of native coronary artery without angina pectoris: Secondary | ICD-10-CM | POA: Diagnosis not present

## 2020-05-21 DIAGNOSIS — I5042 Chronic combined systolic (congestive) and diastolic (congestive) heart failure: Secondary | ICD-10-CM

## 2020-05-21 DIAGNOSIS — R69 Illness, unspecified: Secondary | ICD-10-CM | POA: Diagnosis not present

## 2020-05-21 DIAGNOSIS — I428 Other cardiomyopathies: Secondary | ICD-10-CM | POA: Diagnosis not present

## 2020-05-21 DIAGNOSIS — I4821 Permanent atrial fibrillation: Secondary | ICD-10-CM

## 2020-05-21 DIAGNOSIS — F17210 Nicotine dependence, cigarettes, uncomplicated: Secondary | ICD-10-CM | POA: Diagnosis not present

## 2020-05-21 MED ORDER — FUROSEMIDE 20 MG PO TABS: 20.0000 mg | ORAL_TABLET | Freq: Two times a day (BID) | ORAL | 3 refills | Status: DC | PRN

## 2020-05-21 MED ORDER — SPIRONOLACTONE 25 MG PO TABS: 25.0000 mg | ORAL_TABLET | Freq: Every day | ORAL | 3 refills | Status: DC

## 2020-05-21 NOTE — Progress Notes (Incomplete)
Primary Physician/Referring:  Tamsen Roers, MD  Patient ID: Mario Proctor, male    DOB: [birth year], 80 y.o.   MRN: [id number]  No chief complaint on file.  HPI:    Mario Proctor  is a 80 y.o. Caucasian male patient  with nonischemic cardiomyopathy by coronary angiogram on 11/02/2016 revealing ejection fraction of 30-35%, ongoning tobacco use disorder, and permanent A fib. He is asymptomatic in regards to his A fib and is now on anticoagulation.   Patient presents for 79-month follow-up of atrial fibrillation and heart failure.  He reports continued dyspnea on exertion which is chronic and stable.  Otherwise he remains asymptomatic.  Denies chest pain, palpitations, PND, orthopnea, leg edema.  He does not monitor his blood pressure at home.  He is currently tolerating Xarelto without bleeding diathesis.  Unfortunately he continues to smoke approximately half a pack per day.  He does not have formal exercise routine, however states he remains active caring for his grandchildren 3 days a week and mowing his yard, without issue.  Of note he does report occasional episodes where he loses his appetite for 1 to 2 days.  He is able to eat without problem during these episodes, no changes in bowel movements.  ***Patient presents for follow up after echocardiogram revealed decreased LVEF of 25%. Patient was started on spironolactone 12.5 mg daily, renal function remained stable, he now presents for follow up.   ***Still smoking 1/2 pack per day - refused gum and patches.   Past Medical History:  Diagnosis Date  . A-fib ([place])   . Allergy   . CHF (congestive heart failure) ([place])   . Dupuytren contracture    right sm finger  . Myocardial infarction [facility])    Past Surgical History:  Procedure Laterality Date  . COLON SURGERY     bowel blockage  . DUPUYTREN CONTRACTURE RELEASE Left 2011  . FASCIECTOMY Right 05/02/2015   Procedure: FASCIECTOMY RIGHT SMALL FINGER;  Surgeon: Daryll Brod, MD;   Location: [facility];  Service: Orthopedics;  Laterality: Right;  axillary block in preop  . LEFT HEART CATH AND CORONARY ANGIOGRAPHY N/A 11/03/2016   Procedure: Left Heart Cath and Coronary Angiography;  Surgeon: [name], MD;  Location: [place] [facility];  Service: Cardiovascular;  Laterality: N/A;  . TONSILLECTOMY    . TYMPANOPLASTY Right    Family History  Problem Relation Age of Onset  . Cancer Brother        "in heart and lungs"     Social History   Tobacco Use  . Smoking status: Current Every Day Smoker    Packs/day: 1.00    Years: 57.00    Pack years: 57.00    Types: Cigarettes  . Smokeless tobacco: Never Used  . Tobacco comment: currently smoking .5ppd as of 05/14/20  Substance Use Topics  . Alcohol use: Yes    Comment: social   Marital Status: Married  ROS  Review of Systems  Constitutional: Positive for decreased appetite and weight loss. Negative for malaise/fatigue and weight gain.  Cardiovascular: Positive for dyspnea on exertion (chronic, stable). Negative for chest pain, claudication, leg swelling, near-syncope, orthopnea, palpitations, paroxysmal nocturnal dyspnea and syncope.  Respiratory: Positive for cough (chronic, stable). Negative for shortness of breath.   Endocrine: Negative for cold intolerance.  Hematologic/Lymphatic: Does not bruise/bleed easily.  Musculoskeletal: Negative for joint swelling.  Gastrointestinal: Negative for abdominal pain, anorexia, change in bowel habit, hematochezia and melena.  Neurological: Negative for dizziness,  headaches, light-headedness and weakness.  Psychiatric/Behavioral: Negative for depression and substance abuse.   Objective  There were no vitals taken for this visit.  Vitals with BMI 05/14/2020 03/07/2020 09/07/2019  Height 5\' 11"  5\' 11"  5\' 11"   Weight 162 lbs 13 oz 161 lbs 194 lbs  BMI 22.72 27.51 70.01  Systolic 749 449 675  Diastolic 80 68 70  Pulse 60 66 55     Physical Exam Constitutional:       Appearance: He is well-developed.  HENT:     Head: Atraumatic.  Eyes:     Conjunctiva/sclera: Conjunctivae normal.  Neck:     Thyroid: No thyromegaly.     Vascular: No JVD.  Cardiovascular:     Rate and Rhythm: Normal rate. Rhythm irregular.     Pulses: Intact distal pulses.          Carotid pulses are 2+ on the right side and 2+ on the left side.      Radial pulses are 2+ on the right side and 2+ on the left side.       Femoral pulses are 2+ on the right side and 2+ on the left side.      Popliteal pulses are 2+ on the right side and 2+ on the left side.       Dorsalis pedis pulses are 1+ on the right side and 1+ on the left side.       Posterior tibial pulses are 1+ on the right side and 1+ on the left side.     Heart sounds: Heart sounds are distant. No murmur heard. No gallop.      Comments: No JVD, no leg edema.  Pulmonary:     Effort: Pulmonary effort is normal. No accessory muscle usage or respiratory distress.     Breath sounds: Examination of the right-upper field reveals wheezing. Examination of the left-upper field reveals wheezing. Wheezing and rhonchi present.  Abdominal:     General: Bowel sounds are normal.     Palpations: Abdomen is soft.  Musculoskeletal:        General: Normal range of motion.     Cervical back: Neck supple.  Skin:    General: Skin is warm and dry.  Neurological:     Mental Status: He is alert.    Laboratory examination:   Recent Labs    08/18/19 1149 09/04/19 1030 03/27/20 1001  NA 133* 134 132*  K 4.5 5.1 4.8  CL 97* 96 95*  CO2 28 24 24   GLUCOSE 121* 85 87  BUN 15 14 14   CREATININE 1.16 1.05 0.94  CALCIUM 8.7* 8.7 9.0  GFRNONAA >60 68 77  GFRAA >60 79 89   CrCl cannot be calculated (Patient's most recent lab result is older than the maximum 21 days allowed.).  CMP Latest Ref Rng & Units 03/27/2020 09/04/2019 08/18/2019  Glucose 65 - 99 mg/dL 87 85 121(H)  BUN 8 - 27 mg/dL 14 14 15   Creatinine 0.76 - 1.27 mg/dL 0.94 1.05  1.16  Sodium 134 - 144 mmol/L 132(L) 134 133(L)  Potassium 3.5 - 5.2 mmol/L 4.8 5.1 4.5  Chloride 96 - 106 mmol/L 95(L) 96 97(L)  CO2 20 - 29 mmol/L 24 24 28   Calcium 8.6 - 10.2 mg/dL 9.0 8.7 8.7(L)  Total Protein 6.0 - 8.5 g/dL 6.5 - -  Total Bilirubin 0.0 - 1.2 mg/dL 0.7 - -  Alkaline Phos 44 - 121 IU/L 134(H) - -  AST 0 - 40 IU/L  20 - -  ALT 0 - 44 IU/L 19 - -   CBC Latest Ref Rng & Units 09/04/2019 09/01/2018 04/12/2018  WBC 3.4 - 10.8 x10E3/uL 6.8 6.7 6.7  Hemoglobin 13.0 - 17.7 g/dL 15.3 15.6 16.1  Hematocrit 37.5 - 51.0 % 42.9 45.9 46.8  Platelets 150 - 450 x10E3/uL 209 241 263.0   Lipid Panel     Component Value Date/Time   CHOL 140 09/01/2018 0803   TRIG 54 09/01/2018 0803   HDL 62 09/01/2018 0803   LDLCALC 67 09/01/2018 0803   External labs: None  Medications and allergies   Allergies  Allergen Reactions  . Codeine Itching  . Oxycodone Rash     Current Outpatient Medications  Medication Instructions  . albuterol (ACCUNEB) 1.25 MG/3ML nebulizer solution 1 ampule, Every 4 hours PRN  . albuterol (VENTOLIN HFA) 108 (90 Base) MCG/ACT inhaler As needed  . budesonide (PULMICORT) 0.5 mg, Nebulization, As needed  . ENTRESTO 97-103 MG 1 tablet, Oral, Daily  . metoprolol succinate (TOPROL-XL) 50 MG 24 hr tablet TAKE 1 TABLET BY MOUTH EVERY DAY  . rosuvastatin (CRESTOR) 5 mg, Oral, Daily  . spironolactone (ALDACTONE) 12.5 mg, Oral, Daily  . TRELEGY ELLIPTA 100-62.5-25 MCG/INH AEPB INHALE 1 PUFF INTO THE LUNGS EVERY DAY AS NEEDED  . XARELTO 20 MG TABS tablet TAKE 1 TABLET BY MOUTH EVERY DAY WITH SUPPER   Radiology:   Low-dose CT chest 08/16/2019: Coronary and aortic atherosclerosis and calcification.  Severe centrilobular emphysema.  2 benign looking nodules.  Benign right upper kidney mass.  Cardiac Studies:   Lexiscan myoview stress test 09/18/2016: 1. The resting electrocardiogram demonstrated normal sinus rhythm, normal resting conduction, no resting arrhythmias  and normal rest repolarization. Stress EKG is non-diagnostic for ischemia as it a pharmacologic stress using Lexiscan. Stress symptoms included dyspnea. Occasional PVC noted. 2. The LV is dilated both at rest and stress images. The LV end diastolic volume was 106YI. SPECT images demonstrate Medium perfusion abnormality of moderate intensity in the basal inferior, mid inferior and apical inferior myocardial wall(s) on the stress images. The defect remains relatively unchanged between rest and stress images and is a soft tissue attenuation artifact, however scar in this region without ischemia cannot be completely excluded. The left ventricular ejection fraction was calculated or visually estimated to be 25% with global hypokinesis. High risk study.  Abdominal aortic duplex 10/06/2016: Diffuse plaque noted in the proximal, mid and distal aorta. No AAA observed.  Coronary angiogram 11/03/2016: Severe left ventricular systolic dysfunction. The left ventricular ejection fraction is 30-35% by visual estimate with inferior wall akinesis.There is no mitral valve regurgitation. Normal LVEDP. Normal coronary arteries.  Holter Monitor 48 hours 09/07/2017: Minimum heart rate 38 bpm at 12:13 AM maximum heart rate 154 bpm at 12:59 PM. PVCs consisted of 3600 beats, 1.5% burden. Occasional ventricular and triplets, 194 couplets.. Bigeminy and trigeminy. 2 episodes of 4 beat 3 beat NSVT. Predominant rhythm was atrial fibrillation.  PCV ECHOCARDIOGRAM COMPLETE 03/15/2020 Severely depressed LV systolic function with visual EF 20-25%. Left ventricle cavity is moderately dilated. Dilated cardiomyopathy. Mild concentric hypertrophy of the left ventricle. Hypokinetic global wall motion. Doppler evidence of grade I (impaired) diastolic dysfunction, elevated LAP. Left atrial cavity is severely dilated at 4.6 cm. Structurally normal mitral valve.  Mild to moderate mitral regurgitation. Mild to moderate tricuspid  regurgitation. Moderate pulmonary hypertension. RVSP measures 44 mmHg. CVP estimated at 15 mm Hg. IVC is dilated with poor inspiration collapse consistent with elevated right atrial  pressure. Compared to 02/08/2018, EF has further reduced from 35%.     EKG:   EKG 03/07/2020: Atrial fibrillation with controlled ventricular response at a rate of 61 beats per with ectopic ventricular beats (2).  Left axis deviation, left anterior fascicular block.  Left ventricular hypertrophy. Poor R wave progression, cannot exclude anterior septal infarct old.  Compared to EKG 09/07/2019, no significant change  Assessment     ICD-10-CM   1. Chronic combined systolic and diastolic CHF (congestive heart failure) (HCC)  I50.42   2. Nonischemic cardiomyopathy (HCC)  I42.8   3. Permanent atrial fibrillation (HCC)  I48.21     No orders of the defined types were placed in this encounter.   There are no discontinued medications.  Recommendations:   Mario Proctor  is a 80 y.o. Caucasian male patient  with nonischemic cardiomyopathy by coronary angiogram on 11/02/2016 revealing ejection fraction of 30-35%, ongoning tobacco use disorder, and permanent A fib. He is asymptomatic in regards to his A fib and is now on anticoagulation.   Patient presents for 24-month follow-up of atrial fibrillation and nonischemic cardiomyopathy.  He remains well rate controlled we will continue metoprolol succinate 50 mg daily.  He is tolerating oral anticoagulation with Xarelto 20 mg daily without bleeding diathesis, will continue this.  Patient is also tolerating rosuvastatin 5 mg daily well, which was started due to coronary calcification noted on CT scan.  Reviewed prior labs, lipids are well controlled although he has not had lipid panel done since starting rosuvastatin.  Could consider lipid profile testing at next visit.  There are no clinical signs of decompensated heart failure at this time, and he remained stable and  asymptomatic.  We will continue Entresto 97/103, patient has only been able to tolerate 1 dose per day so we will continue this.  Last echocardiogram was October 2019, will obtain repeat echocardiogram at this time.  Patient continues to smoke about half a pack per day.  Educated him again on the importance of smoking cessation and offered medications to assist him in quitting.  Patient prefers to work on quitting independently, encouraged him to let us know if he changes his mind and would like medication assistance.  Of note patient's blood pressure was initially elevated in the office, however it did improve upon recheck.  Encourage patient to monitor his blood pressure at home regularly until it is our office know if it remains consistently greater than 505 systolic.  In regard to patient's symptoms of decreased appetite encouraged him to schedule appointment with primary care provider.  He states he has not seen PCP in over a year, encouraged him to schedule a routine visit.  Follow-up in 6 months for A. fib and nonischemic cardiomyopathy.  Patient was seen in collaboration with Dr. Einar Gip. He also reviewed patient's chart and  Dr. Einar Gip is in agreement of the plan.   ***Echo results - LVEF from 35% to 20-25%. HF med titration. Increased metoprolol or spironolactone? Tobacco use? Lipid panel - none.   ***

## 2020-05-29 MED ORDER — AMIODARONE HCL 200 MG PO TABS: 200.0000 mg | ORAL_TABLET | Freq: Two times a day (BID) | ORAL | 3 refills | Status: DC

## 2020-06-10 ENCOUNTER — Other Ambulatory Visit: Payer: Self-pay | Admitting: Student

## 2020-06-12 DIAGNOSIS — I5042 Chronic combined systolic (congestive) and diastolic (congestive) heart failure: Secondary | ICD-10-CM | POA: Diagnosis not present

## 2020-06-12 DIAGNOSIS — I2510 Atherosclerotic heart disease of native coronary artery without angina pectoris: Secondary | ICD-10-CM | POA: Diagnosis not present

## 2020-06-13 ENCOUNTER — Other Ambulatory Visit: Payer: Self-pay

## 2020-06-13 ENCOUNTER — Ambulatory Visit: Payer: Medicare HMO | Admitting: Student

## 2020-06-13 ENCOUNTER — Telehealth: Payer: Self-pay | Admitting: Student

## 2020-06-13 DIAGNOSIS — I4891 Unspecified atrial fibrillation: Secondary | ICD-10-CM

## 2020-06-13 DIAGNOSIS — I5042 Chronic combined systolic (congestive) and diastolic (congestive) heart failure: Secondary | ICD-10-CM

## 2020-06-13 LAB — LIPID PANEL WITH LDL/HDL RATIO
Cholesterol, Total: 139 mg/dL (ref 100–199)
HDL: 66 mg/dL (ref >39)
LDL Chol Calc (NIH): 60 mg/dL (ref 0–99)
LDL/HDL Ratio: 0.9 ratio (ref 0.0–3.6)
Triglycerides: 61 mg/dL (ref 0–149)
VLDL Cholesterol Cal: 13 mg/dL (ref 5–40)

## 2020-06-13 LAB — BASIC METABOLIC PANEL
BUN/Creatinine Ratio: 15 (ref 10–24)
BUN: 23 mg/dL (ref 8–27)
CO2: 22 mmol/L (ref 20–29)
Calcium: 8.7 mg/dL (ref 8.6–10.2)
Chloride: 95 mmol/L — ABNORMAL LOW (ref 96–106)
Creatinine, Ser: 1.53 mg/dL — ABNORMAL HIGH (ref 0.76–1.27)
GFR calc Af Amer: 50 mL/min/{1.73_m2} — ABNORMAL LOW (ref >59)
GFR calc non Af Amer: 43 mL/min/{1.73_m2} — ABNORMAL LOW (ref >59)
Glucose: 102 mg/dL — ABNORMAL HIGH (ref 65–99)
Potassium: 5.5 mmol/L — ABNORMAL HIGH (ref 3.5–5.2)
Sodium: 131 mmol/L — ABNORMAL LOW (ref 134–144)

## 2020-06-13 LAB — BRAIN NATRIURETIC PEPTIDE: BNP: 468 pg/mL — ABNORMAL HIGH (ref 0.0–100.0)

## 2020-06-13 NOTE — Progress Notes (Signed)
Spoke with patient regarding lab results, he is aware to reduce spironolactone to 12.5 mg daily. See telephone encounter.

## 2020-06-13 NOTE — Telephone Encounter (Signed)
Patient's renal function has deteriorated since increasing spironolactone from 12.5 to 25 mg daily.  Call patient and spoke with him regarding lab results, he verbalized understanding.  Advised patient to reduce spironolactone to 12.5 mg daily and repeat BMP in 1 week, patient verbalized understanding agreement.

## 2020-06-13 NOTE — Progress Notes (Signed)
Patient presents for nurse with today for repeat EKG after initiation of amiodarone. EKG today reveals atrial fibrillation with controlled ventricular response. Will schedule for cardioversion with labs prior as patient's LVEF is now reduced per last echocardiogram.   EKG 06/13/2020: Atrial fibrillation with controlled ventricular response at a rate of 68 bpm.  Left axis, left anterior fascicular block.  Poor R wave progression, cannot exclude anteroseptal infarct old.

## 2020-06-18 DIAGNOSIS — H2513 Age-related nuclear cataract, bilateral: Secondary | ICD-10-CM | POA: Diagnosis not present

## 2020-06-18 DIAGNOSIS — H524 Presbyopia: Secondary | ICD-10-CM | POA: Diagnosis not present

## 2020-06-20 DIAGNOSIS — I4891 Unspecified atrial fibrillation: Secondary | ICD-10-CM | POA: Diagnosis not present

## 2020-06-20 DIAGNOSIS — I5042 Chronic combined systolic (congestive) and diastolic (congestive) heart failure: Secondary | ICD-10-CM | POA: Diagnosis not present

## 2020-06-21 ENCOUNTER — Other Ambulatory Visit: Payer: Self-pay | Admitting: Student

## 2020-06-21 DIAGNOSIS — I5042 Chronic combined systolic (congestive) and diastolic (congestive) heart failure: Secondary | ICD-10-CM

## 2020-06-21 LAB — BASIC METABOLIC PANEL
BUN/Creatinine Ratio: 14 (ref 10–24)
BUN: 21 mg/dL (ref 8–27)
CO2: 21 mmol/L (ref 20–29)
Calcium: 8.8 mg/dL (ref 8.6–10.2)
Chloride: 94 mmol/L — ABNORMAL LOW (ref 96–106)
Creatinine, Ser: 1.49 mg/dL — ABNORMAL HIGH (ref 0.76–1.27)
GFR calc Af Amer: 51 mL/min/{1.73_m2} — ABNORMAL LOW (ref >59)
GFR calc non Af Amer: 44 mL/min/{1.73_m2} — ABNORMAL LOW (ref >59)
Glucose: 60 mg/dL — ABNORMAL LOW (ref 65–99)
Potassium: 5 mmol/L (ref 3.5–5.2)
Sodium: 131 mmol/L — ABNORMAL LOW (ref 134–144)

## 2020-06-21 LAB — CBC
Hematocrit: 42.2 % (ref 37.5–51.0)
Hemoglobin: 14.5 g/dL (ref 13.0–17.7)
MCH: 31.3 pg (ref 26.6–33.0)
MCHC: 34.4 g/dL (ref 31.5–35.7)
MCV: 91 fL (ref 79–97)
Platelets: 243 10*3/uL (ref 150–450)
RBC: 4.63 x10E6/uL (ref 4.14–5.80)
RDW: 12 % (ref 11.6–15.4)
WBC: 7.4 10*3/uL (ref 3.4–10.8)

## 2020-06-21 NOTE — Telephone Encounter (Signed)
Yes, and he will repeat blood work in two weeks correct?

## 2020-06-21 NOTE — Telephone Encounter (Signed)
Yes, thank you.

## 2020-06-21 NOTE — Telephone Encounter (Signed)
Called and spoke with patient regarding lab results. Pt voiced understanding.

## 2020-06-21 NOTE — Telephone Encounter (Signed)
He knows to stop the spironolactone?

## 2020-06-21 NOTE — Telephone Encounter (Signed)
Please inform patient his kidney function has improved, but has not returned to baseline. Please have him stop spironolactone and we will recheck BMP in 2 weeks.

## 2020-06-29 ENCOUNTER — Other Ambulatory Visit: Payer: Self-pay | Admitting: Pulmonary Disease

## 2020-07-04 ENCOUNTER — Other Ambulatory Visit: Payer: Self-pay | Admitting: Cardiology

## 2020-07-04 ENCOUNTER — Other Ambulatory Visit: Payer: Self-pay | Admitting: Pulmonary Disease

## 2020-07-04 DIAGNOSIS — I5042 Chronic combined systolic (congestive) and diastolic (congestive) heart failure: Secondary | ICD-10-CM | POA: Diagnosis not present

## 2020-07-05 LAB — BASIC METABOLIC PANEL
BUN/Creatinine Ratio: 13 (ref 10–24)
BUN: 21 mg/dL (ref 8–27)
CO2: 27 mmol/L (ref 20–29)
Calcium: 9 mg/dL (ref 8.6–10.2)
Chloride: 93 mmol/L — ABNORMAL LOW (ref 96–106)
Creatinine, Ser: 1.66 mg/dL — ABNORMAL HIGH (ref 0.76–1.27)
GFR calc Af Amer: 45 mL/min/{1.73_m2} — ABNORMAL LOW (ref >59)
GFR calc non Af Amer: 39 mL/min/{1.73_m2} — ABNORMAL LOW (ref >59)
Glucose: 80 mg/dL (ref 65–99)
Potassium: 5.2 mmol/L (ref 3.5–5.2)
Sodium: 132 mmol/L — ABNORMAL LOW (ref 134–144)

## 2020-07-05 NOTE — Progress Notes (Signed)
Attempted to call pt, no answer. Left vm requesting call back.

## 2020-07-05 NOTE — Progress Notes (Signed)
Please advise patient to continue to avoid taking spironolactone.  Please ask him how much furosemide he is presently taking? Renal function has not improved to baseline, will continue to monitor.  Also please verify that patient is scheduled for cardioversion and office visit following procedure.

## 2020-07-08 ENCOUNTER — Telehealth: Payer: Self-pay

## 2020-07-08 NOTE — Telephone Encounter (Signed)
Were you able to verify, is patient scheueld for cardioversion?

## 2020-07-08 NOTE — Progress Notes (Signed)
Spoke to patient

## 2020-07-08 NOTE — Telephone Encounter (Signed)
Please advise pt to cut furosemide to once daily, with second dose only as needed for fluid overload. Thanks

## 2020-07-08 NOTE — Telephone Encounter (Signed)
Patient called back he was advised to stop spironolactone and he is taking furosemide BID.

## 2020-07-08 NOTE — Telephone Encounter (Signed)
Yes he is scheduled for 22 of march

## 2020-07-10 NOTE — Telephone Encounter (Signed)
Spoke to patient he is aware

## 2020-07-15 DIAGNOSIS — E261 Secondary hyperaldosteronism: Secondary | ICD-10-CM | POA: Diagnosis not present

## 2020-07-15 DIAGNOSIS — I4891 Unspecified atrial fibrillation: Secondary | ICD-10-CM | POA: Diagnosis not present

## 2020-07-15 DIAGNOSIS — I2510 Atherosclerotic heart disease of native coronary artery without angina pectoris: Secondary | ICD-10-CM | POA: Diagnosis not present

## 2020-07-15 DIAGNOSIS — Z008 Encounter for other general examination: Secondary | ICD-10-CM | POA: Diagnosis not present

## 2020-07-15 DIAGNOSIS — H269 Unspecified cataract: Secondary | ICD-10-CM | POA: Diagnosis not present

## 2020-07-15 DIAGNOSIS — J449 Chronic obstructive pulmonary disease, unspecified: Secondary | ICD-10-CM | POA: Diagnosis not present

## 2020-07-15 DIAGNOSIS — E785 Hyperlipidemia, unspecified: Secondary | ICD-10-CM | POA: Diagnosis not present

## 2020-07-15 DIAGNOSIS — I252 Old myocardial infarction: Secondary | ICD-10-CM | POA: Diagnosis not present

## 2020-07-15 DIAGNOSIS — D6869 Other thrombophilia: Secondary | ICD-10-CM | POA: Diagnosis not present

## 2020-07-15 DIAGNOSIS — R69 Illness, unspecified: Secondary | ICD-10-CM | POA: Diagnosis not present

## 2020-07-15 DIAGNOSIS — I509 Heart failure, unspecified: Secondary | ICD-10-CM | POA: Diagnosis not present

## 2020-07-16 ENCOUNTER — Telehealth: Payer: Self-pay

## 2020-07-16 ENCOUNTER — Other Ambulatory Visit: Payer: Self-pay | Admitting: Student

## 2020-07-16 DIAGNOSIS — I5042 Chronic combined systolic (congestive) and diastolic (congestive) heart failure: Secondary | ICD-10-CM

## 2020-07-16 DIAGNOSIS — R0602 Shortness of breath: Secondary | ICD-10-CM

## 2020-07-16 MED ORDER — FUROSEMIDE 20 MG PO TABS: 40.0000 mg | ORAL_TABLET | Freq: Two times a day (BID) | ORAL | 3 refills | Status: DC

## 2020-07-16 NOTE — Telephone Encounter (Signed)
Patient called and said that he has SOB and has gained 8 pounds

## 2020-07-16 NOTE — Telephone Encounter (Signed)
Please advise patient to increase to lasix 40 mg BID for 1 week. Repeat BMP and BNP in 1 week.

## 2020-07-22 ENCOUNTER — Ambulatory Visit: Payer: Medicare HMO | Admitting: Student

## 2020-07-23 DIAGNOSIS — I5042 Chronic combined systolic (congestive) and diastolic (congestive) heart failure: Secondary | ICD-10-CM | POA: Diagnosis not present

## 2020-07-23 DIAGNOSIS — R0602 Shortness of breath: Secondary | ICD-10-CM | POA: Diagnosis not present

## 2020-07-24 LAB — BRAIN NATRIURETIC PEPTIDE: BNP: 1256.6 pg/mL — ABNORMAL HIGH (ref 0.0–100.0)

## 2020-07-24 LAB — BASIC METABOLIC PANEL
BUN/Creatinine Ratio: 10 (ref 10–24)
BUN: 16 mg/dL (ref 8–27)
CO2: 24 mmol/L (ref 20–29)
Calcium: 8.7 mg/dL (ref 8.6–10.2)
Chloride: 92 mmol/L — ABNORMAL LOW (ref 96–106)
Creatinine, Ser: 1.56 mg/dL — ABNORMAL HIGH (ref 0.76–1.27)
Glucose: 94 mg/dL (ref 65–99)
Potassium: 4.2 mmol/L (ref 3.5–5.2)
Sodium: 130 mmol/L — ABNORMAL LOW (ref 134–144)
eGFR: 45 mL/min/{1.73_m2} — ABNORMAL LOW (ref >59)

## 2020-07-24 NOTE — Progress Notes (Signed)
Advised patient to try stopping furosemide twice daily and instead take 20 mg once daily with additional dose as needed for volume overload.

## 2020-07-25 ENCOUNTER — Other Ambulatory Visit: Payer: Self-pay | Admitting: Student

## 2020-07-25 DIAGNOSIS — I5042 Chronic combined systolic (congestive) and diastolic (congestive) heart failure: Secondary | ICD-10-CM

## 2020-07-25 NOTE — Progress Notes (Signed)
Please call patient and inform him that his BNP is very elevated, indicating his deterioration in renal function may be due to underlying volume overload. Yesterday I advised him to reduce lasix, but he actually likely needs more lasix to pull fluid off of him and improve renal function.   Please advise him to increase furosemide to 40 mg twice daily with repeat BMP and BNP in 1 week.   Please apologize on my behalf for the repeat change.

## 2020-07-25 NOTE — Progress Notes (Signed)
Called and spoke to pt regarding lab results and increased furosemide. Pt voiced understanding.

## 2020-07-27 ENCOUNTER — Other Ambulatory Visit (HOSPITAL_COMMUNITY)
Admission: RE | Admit: 2020-07-27 | Discharge: 2020-07-27 | Disposition: A | Discharge status: Home / Self Care | Payer: Medicare HMO | Source: Ambulatory Visit | Attending: Cardiology | Admitting: Cardiology

## 2020-07-27 DIAGNOSIS — Z20822 Contact with and (suspected) exposure to covid-19: Secondary | ICD-10-CM | POA: Insufficient documentation

## 2020-07-27 DIAGNOSIS — Z01812 Encounter for preprocedural laboratory examination: Secondary | ICD-10-CM | POA: Diagnosis not present

## 2020-07-27 LAB — SARS CORONAVIRUS 2 (TAT 6-24 HRS): SARS Coronavirus 2: NEGATIVE

## 2020-07-30 ENCOUNTER — Ambulatory Visit (HOSPITAL_COMMUNITY): Payer: Medicare HMO | Admitting: Anesthesiology

## 2020-07-30 ENCOUNTER — Encounter (HOSPITAL_COMMUNITY): Payer: Self-pay | Admitting: Cardiology

## 2020-07-30 ENCOUNTER — Encounter (HOSPITAL_COMMUNITY)
Admission: RE | Disposition: A | Discharge status: Home / Self Care | Payer: Self-pay | Source: Home / Self Care | Attending: Cardiology

## 2020-07-30 ENCOUNTER — Ambulatory Visit (HOSPITAL_COMMUNITY)
Admission: RE | Admit: 2020-07-30 | Discharge: 2020-07-30 | Disposition: A | Discharge status: Home / Self Care | Payer: Medicare HMO | Attending: Cardiology | Admitting: Cardiology

## 2020-07-30 DIAGNOSIS — I428 Other cardiomyopathies: Secondary | ICD-10-CM | POA: Insufficient documentation

## 2020-07-30 DIAGNOSIS — Z79899 Other long term (current) drug therapy: Secondary | ICD-10-CM | POA: Insufficient documentation

## 2020-07-30 DIAGNOSIS — I252 Old myocardial infarction: Secondary | ICD-10-CM | POA: Diagnosis not present

## 2020-07-30 DIAGNOSIS — I4819 Other persistent atrial fibrillation: Secondary | ICD-10-CM | POA: Diagnosis not present

## 2020-07-30 DIAGNOSIS — F17210 Nicotine dependence, cigarettes, uncomplicated: Secondary | ICD-10-CM | POA: Insufficient documentation

## 2020-07-30 DIAGNOSIS — I482 Chronic atrial fibrillation, unspecified: Secondary | ICD-10-CM | POA: Diagnosis not present

## 2020-07-30 DIAGNOSIS — I5042 Chronic combined systolic (congestive) and diastolic (congestive) heart failure: Secondary | ICD-10-CM

## 2020-07-30 DIAGNOSIS — I255 Ischemic cardiomyopathy: Secondary | ICD-10-CM | POA: Diagnosis not present

## 2020-07-30 DIAGNOSIS — Z7901 Long term (current) use of anticoagulants: Secondary | ICD-10-CM | POA: Insufficient documentation

## 2020-07-30 DIAGNOSIS — I5022 Chronic systolic (congestive) heart failure: Secondary | ICD-10-CM | POA: Diagnosis not present

## 2020-07-30 DIAGNOSIS — I4821 Permanent atrial fibrillation: Secondary | ICD-10-CM | POA: Diagnosis not present

## 2020-07-30 DIAGNOSIS — R69 Illness, unspecified: Secondary | ICD-10-CM | POA: Diagnosis not present

## 2020-07-30 HISTORY — PX: CARDIOVERSION: SHX1299

## 2020-07-30 SURGERY — CARDIOVERSION
Anesthesia: General

## 2020-07-30 MED ORDER — SODIUM CHLORIDE 0.9 % IV SOLN: INTRAVENOUS | Status: DC | PRN

## 2020-07-30 MED ORDER — LIDOCAINE 2% (20 MG/ML) 5 ML SYRINGE
INTRAMUSCULAR | Status: DC | PRN
  Administered 2020-07-30: 80 mg via INTRAVENOUS

## 2020-07-30 MED ORDER — PROPOFOL 10 MG/ML IV BOLUS
INTRAVENOUS | Status: DC | PRN
  Administered 2020-07-30: 10 mg via INTRAVENOUS
  Administered 2020-07-30: 50 mg via INTRAVENOUS

## 2020-07-30 MED ORDER — AMIODARONE HCL 200 MG PO TABS: 200.0000 mg | ORAL_TABLET | Freq: Every day | ORAL | 3 refills | Status: DC

## 2020-07-30 NOTE — Anesthesia Postprocedure Evaluation (Signed)
Anesthesia Post Note  Patient: Mario Proctor  Procedure(s) Performed: CARDIOVERSION (N/A )     Patient location during evaluation: PACU Anesthesia Type: General Level of consciousness: sedated Pain management: pain level controlled Vital Signs Assessment: post-procedure vital signs reviewed and stable Respiratory status: spontaneous breathing and respiratory function stable Cardiovascular status: stable Postop Assessment: no apparent nausea or vomiting Anesthetic complications: no   No complications documented.  Last Vitals:  Vitals:   07/30/20 1240 07/30/20 1248  BP:  (!) 143/65  Pulse: (!) 55 (!) 55  Resp: 17 15  Temp:    SpO2: 95% 94%    Last Pain:  Vitals:   07/30/20 1248  TempSrc:   PainSc: 0-No pain                 [name]

## 2020-07-30 NOTE — Transfer of Care (Signed)
Immediate Anesthesia Transfer of Care Note  Patient: Mario Proctor  Procedure(s) Performed: CARDIOVERSION (N/A )  Patient Location: Endoscopy Unit  Anesthesia Type:General  Level of Consciousness: awake, alert  and oriented  Airway & Oxygen Therapy: Patient Spontanous Breathing and Patient connected to nasal cannula oxygen  Post-op Assessment: Report given to RN, Post -op Vital signs reviewed and stable and Patient moving all extremities  Post vital signs: Reviewed and stable  Last Vitals:  Vitals Value Taken Time  BP    Temp    Pulse    Resp    SpO2      Last Pain:  Vitals:   07/30/20 1108  TempSrc: Oral         Complications: No complications documented.

## 2020-07-30 NOTE — CV Procedure (Signed)
Direct current cardioversion 07/30/20 :  Indication symptomatic A. Fibrillation.  Procedure: Using 80 mg of IV Propofol and 60 IV Lidocaine (for reducing venous pain) for achieving deep sedation, synchronized direct current cardioversion performed. Patient was delivered with 150 Joules of electricity X 1 with success to NSR. Patient tolerated the procedure well. No immediate complication noted.    [name], MD, [facility] 07/30/2020, 12:33 PM Office: [phone] Pager: [phone]

## 2020-07-30 NOTE — Anesthesia Preprocedure Evaluation (Signed)
Anesthesia Evaluation  Patient identified by MRN, date of birth, ID band Patient awake    Reviewed: Allergy & Precautions, H&P , NPO status , Patient's Chart, lab work & pertinent test results  Airway Mallampati: II   Neck ROM: full    Dental   Pulmonary COPD, Current Smoker,    breath sounds clear to auscultation       Cardiovascular + Past MI and +CHF  + dysrhythmias Atrial Fibrillation  Rhythm:irregular Rate:Normal     Neuro/Psych    GI/Hepatic   Endo/Other    Renal/GU Renal InsufficiencyRenal disease     Musculoskeletal   Abdominal   Peds  Hematology   Anesthesia Other Findings   Reproductive/Obstetrics                             Anesthesia Physical Anesthesia Plan  ASA: III  Anesthesia Plan: General   Post-op Pain Management:    Induction: Intravenous  PONV Risk Score and Plan: 1 and Propofol infusion and Treatment may vary due to age or medical condition  Airway Management Planned: Mask  Additional Equipment:   Intra-op Plan:   Post-operative Plan:   Informed Consent: I have reviewed the patients History and Physical, chart, labs and discussed the procedure including the risks, benefits and alternatives for the proposed anesthesia with the patient or authorized representative who has indicated his/her understanding and acceptance.     Dental advisory given  Plan Discussed with: CRNA, Anesthesiologist and Surgeon  Anesthesia Plan Comments:         Anesthesia Quick Evaluation

## 2020-07-30 NOTE — Discharge Instructions (Signed)
Electrical Cardioversion Electrical cardioversion is the delivery of a jolt of electricity to restore a normal rhythm to the heart. A rhythm that is too fast or is not regular keeps the heart from pumping well. In this procedure, sticky patches or metal paddles are placed on the chest to deliver electricity to the heart from a device. This procedure may be done in an emergency if:  There is low or no blood pressure as a result of the heart rhythm.  Normal rhythm must be restored as fast as possible to protect the brain and heart from further damage.  It may save a life. This may also be a scheduled procedure for irregular or fast heart rhythms that are not immediately life-threatening. Tell a health care provider about:  Any allergies you have.  All medicines you are taking, including vitamins, herbs, eye drops, creams, and over-the-counter medicines.  Any problems you or family members have had with anesthetic medicines.  Any blood disorders you have.  Any surgeries you have had.  Any medical conditions you have.  Whether you are pregnant or may be pregnant. What are the risks? Generally, this is a safe procedure. However, problems may occur, including:  Allergic reactions to medicines.  A blood clot that breaks free and travels to other parts of your body.  The possible return of an abnormal heart rhythm within hours or days after the procedure.  Your heart stopping (cardiac arrest). This is rare. What happens before the procedure? Medicines  Your health care provider may have you start taking: ? Blood-thinning medicines (anticoagulants) so your blood does not clot as easily. ? Medicines to help stabilize your heart rate and rhythm.  Ask your health care provider about: ? Changing or stopping your regular medicines. This is especially important if you are taking diabetes medicines or blood thinners. ? Taking medicines such as aspirin and ibuprofen. These medicines can  thin your blood. Do not take these medicines unless your health care provider tells you to take them. ? Taking over-the-counter medicines, vitamins, herbs, and supplements. General instructions  Follow instructions from your health care provider about eating or drinking restrictions.  Plan to have someone take you home from the hospital or clinic.  If you will be going home right after the procedure, plan to have someone with you for 24 hours.  Ask your health care provider what steps will be taken to help prevent infection. These may include washing your skin with a germ-killing soap. What happens during the procedure?  An IV will be inserted into one of your veins.  Sticky patches (electrodes) or metal paddles may be placed on your chest.  You will be given a medicine to help you relax (sedative).  An electrical shock will be delivered. The procedure may vary among health care providers and hospitals.   What can I expect after the procedure?  Your blood pressure, heart rate, breathing rate, and blood oxygen level will be monitored until you leave the hospital or clinic.  Your heart rhythm will be watched to make sure it does not change.  You may have some redness on the skin where the shocks were given. Follow these instructions at home:  Do not drive for 24 hours if you were given a sedative during your procedure.  Take over-the-counter and prescription medicines only as told by your health care provider.  Ask your health care provider how to check your pulse. Check it often.  Rest for 48 hours after the procedure   or as told by your health care provider.  Avoid or limit your caffeine use as told by your health care provider.  Keep all follow-up visits as told by your health care provider. This is important. Contact a health care provider if:  You feel like your heart is beating too quickly or your pulse is not regular.  You have a serious muscle cramp that does not go  away. Get help right away if:  You have discomfort in your chest.  You are dizzy or you feel faint.  You have trouble breathing or you are short of breath.  Your speech is slurred.  You have trouble moving an arm or leg on one side of your body.  Your fingers or toes turn cold or blue. Summary  Electrical cardioversion is the delivery of a jolt of electricity to restore a normal rhythm to the heart.  This procedure may be done right away in an emergency or may be a scheduled procedure if the condition is not an emergency.  Generally, this is a safe procedure.  After the procedure, check your pulse often as told by your health care provider. This information is not intended to replace advice given to you by your health care provider. Make sure you discuss any questions you have with your health care provider. Document Revised: 11/28/2018 Document Reviewed: 11/28/2018 Elsevier Patient Education  2021 [employer]  

## 2020-07-30 NOTE — Anesthesia Procedure Notes (Signed)
Procedure Name: General with mask airway Date/Time: 07/30/2020 12:14 PM Performed by: Amadeo Garnet, CRNA Pre-anesthesia Checklist: Patient identified, Emergency Drugs available, Suction available and Patient being monitored Patient Re-evaluated:Patient Re-evaluated prior to induction Oxygen Delivery Method: Ambu bag Preoxygenation: Pre-oxygenation with 100% oxygen Induction Type: IV induction Placement Confirmation: positive ETCO2 Dental Injury: Teeth and Oropharynx as per pre-operative assessment

## 2020-07-30 NOTE — H&P (Signed)
Primary Physician/Referring:  Tamsen Roers, MD  Patient ID: Mario Proctor, male    DOB: [birth year], 80 y.o.   MRN: [id number]  No chief complaint on file.  HPI:    Mario Proctor  is a 80 y.o. Caucasian male patient  with nonischemic cardiomyopathy by coronary angiogram on 11/02/2016 revealing ejection fraction of 30-35%, ongoning tobacco use disorder, and permanent A fib. He is asymptomatic in regards to his A fib and is now on anticoagulation.   Patient presents for follow up after echocardiogram revealed decreased LVEF of 25%. Denies chest pain, palpitations, PND, orthopnea, leg edema.  He does continue to have dyspnea on exertion which is chronic and stable.  Patient unfortunately continues to smoke approximately Half a pack of cigarettes per day.  He is tolerating Xarelto without bleeding diathesis.  Of note patient was recently evaluated by pulmonology for COPD, provider there ordered BNP which was elevated at 900 pg/mL.   Past Medical History:  Diagnosis Date  . A-fib ([place])   . Allergy   . CHF (congestive heart failure) ([place])   . Dupuytren contracture    right sm finger  . Myocardial infarction [facility]    Past Surgical History:  Procedure Laterality Date  . COLON SURGERY     bowel blockage  . DUPUYTREN CONTRACTURE RELEASE Left 2011  . FASCIECTOMY Right 05/02/2015   Procedure: FASCIECTOMY RIGHT SMALL FINGER;  Surgeon: Daryll Brod, MD;  Location: [facility];  Service: Orthopedics;  Laterality: Right;  axillary block in preop  . LEFT HEART CATH AND CORONARY ANGIOGRAPHY N/A 11/03/2016   Procedure: Left Heart Cath and Coronary Angiography;  Surgeon: [name], MD;  Location: [facility];  Service: Cardiovascular;  Laterality: N/A;  . TONSILLECTOMY    . TYMPANOPLASTY Right    Family History  Problem Relation Age of Onset  . Cancer Brother        "in heart and lungs"     Social History   Tobacco Use  . Smoking status: Current Every Day Smoker     Packs/day: 1.00    Years: 57.00    Pack years: 57.00    Types: Cigarettes  . Smokeless tobacco: Never Used  . Tobacco comment: currently smoking .5ppd as of 05/14/20  Substance Use Topics  . Alcohol use: Yes    Comment: social   Marital Status: Married  ROS  Review of Systems  Constitutional: Negative for malaise/fatigue.  Cardiovascular: Positive for dyspnea on exertion (chronic, stable). Negative for chest pain, claudication, leg swelling, near-syncope, orthopnea, palpitations, paroxysmal nocturnal dyspnea and syncope.  Respiratory: Positive for cough (chronic, stable). Negative for shortness of breath.   Endocrine: Negative for cold intolerance.  Hematologic/Lymphatic: Does not bruise/bleed easily.  Musculoskeletal: Negative for joint swelling.  Gastrointestinal: Negative for abdominal pain, anorexia, change in bowel habit, hematochezia and melena.  Neurological: Negative for dizziness, headaches, light-headedness and weakness.  Psychiatric/Behavioral: Negative for depression and substance abuse.   Objective  Pulse (!) 57, temperature 97.8 F (36.6 C), temperature source Oral.  Vitals with BMI 07/30/2020 05/21/2020 05/14/2020  Height - '5\' 11"'$  '5\' 11"'$   Weight - 163 lbs 13 oz 162 lbs 13 oz  BMI - [id number] [id number]  Systolic - Q000111Q [id number]  Diastolic - 76 80  Pulse 57 65 60     Physical Exam Constitutional:      Appearance: He is well-developed.  HENT:     Head: Atraumatic.  Eyes:     Conjunctiva/sclera: Conjunctivae normal.  Neck:     Thyroid: No thyromegaly.     Vascular: No JVD.  Cardiovascular:     Rate and Rhythm: Normal rate. Rhythm irregular.     Pulses: Intact distal pulses.          Carotid pulses are 2+ on the right side and 2+ on the left side.      Radial pulses are 2+ on the right side and 2+ on the left side.       Femoral pulses are 2+ on the right side and 2+ on the left side.      Popliteal pulses are 2+ on the right side and 2+ on the left side.       Dorsalis pedis  pulses are 1+ on the right side and 1+ on the left side.       Posterior tibial pulses are 1+ on the right side and 1+ on the left side.     Heart sounds: Heart sounds are distant. No murmur heard. No gallop.      Comments: No JVD.  Pulmonary:     Effort: Pulmonary effort is normal. No accessory muscle usage or respiratory distress.     Breath sounds: Examination of the right-upper field reveals wheezing. Examination of the left-upper field reveals wheezing. Wheezing and rhonchi present.  Abdominal:     General: Bowel sounds are normal.     Palpations: Abdomen is soft.  Musculoskeletal:        General: Normal range of motion.     Cervical back: Neck supple.     Right lower leg: Edema (Trace) present.     Left lower leg: Edema (Trace) present.  Skin:    General: Skin is warm and dry.  Neurological:     Mental Status: He is alert.    Laboratory examination:   Recent Labs    06/12/20 0947 06/20/20 0950 07/04/20 0920 07/23/20 1052  NA 131* 131* 132* 130*  K 5.5* 5.0 5.2 4.2  CL 95* 94* 93* 92*  CO2 '22 21 27 24  '$ GLUCOSE 102* 60* 80 94  BUN '23 21 21 16  '$ CREATININE 1.53* 1.49* 1.66* 1.56*  CALCIUM 8.7 8.8 9.0 8.7  GFRNONAA 43* 44* 39*  --   GFRAA 50* 51* 45*  --    CrCl cannot be calculated (Unknown ideal weight.).  CMP Latest Ref Rng & Units 07/23/2020 07/04/2020 06/20/2020  Glucose 65 - 99 mg/dL 94 80 60(L)  BUN 8 - 27 mg/dL '16 21 21  '$ Creatinine 0.76 - 1.27 mg/dL 1.56(H) 1.66(H) 1.49(H)  Sodium 134 - 144 mmol/L 130(L) 132(L) 131(L)  Potassium 3.5 - 5.2 mmol/L 4.2 5.2 5.0  Chloride 96 - 106 mmol/L 92(L) 93(L) 94(L)  CO2 20 - 29 mmol/L '24 27 21  '$ Calcium 8.6 - 10.2 mg/dL 8.7 9.0 8.8  Total Protein 6.0 - 8.5 g/dL - - -  Total Bilirubin 0.0 - 1.2 mg/dL - - -  Alkaline Phos 44 - 121 IU/L - - -  AST 0 - 40 IU/L - - -  ALT 0 - 44 IU/L - - -   CBC Latest Ref Rng & Units 06/20/2020 09/04/2019 09/01/2018  WBC 3.4 - 10.8 x10E3/uL 7.4 6.8 6.7  Hemoglobin 13.0 - 17.7 g/dL 14.5 15.3  15.6  Hematocrit 37.5 - 51.0 % 42.2 42.9 45.9  Platelets 150 - 450 x10E3/uL 243 209 241   Lipid Panel     Component Value Date/Time   CHOL 139 06/12/2020 0947   TRIG 61  06/12/2020 0947   HDL 66 06/12/2020 0947   LDLCALC 60 06/12/2020 0947   External labs: None  Medications and allergies   No Known Allergies   Current Outpatient Medications  Medication Instructions  . albuterol (ACCUNEB) 1.25 MG/3ML nebulizer solution 1 ampule, Nebulization, Every 4 hours PRN  . albuterol (VENTOLIN HFA) 108 (90 Base) MCG/ACT inhaler INHALE 2 PUFF BY MOUTH EVERY 6 HOURS AS NEEDED FOR WHEEZE OR SHORTNESS OF BREATH  . amiodarone (PACERONE) 200 mg, Oral, 2 times daily  . budesonide (PULMICORT) 0.5 mg, Nebulization, Daily PRN  . ENTRESTO 97-103 MG 1 tablet, Oral, Daily  . furosemide (LASIX) 40 mg, Oral, 2 times daily  . metoprolol succinate (TOPROL-XL) 50 MG 24 hr tablet TAKE 1 TABLET BY MOUTH EVERY DAY  . rosuvastatin (CRESTOR) 5 mg, Oral, Daily  . TRELEGY ELLIPTA 100-62.5-25 MCG/INH AEPB INHALE 1 PUFF INTO THE LUNGS EVERY DAY AS NEEDED  . XARELTO 20 MG TABS tablet TAKE 1 TABLET BY MOUTH EVERY DAY WITH SUPPER   Radiology:   Low-dose CT chest 08/16/2019: Coronary and aortic atherosclerosis and calcification.  Severe centrilobular emphysema.  2 benign looking nodules.  Benign right upper kidney mass.  Cardiac Studies:   Lexiscan myoview stress test 09/18/2016: 1. The resting electrocardiogram demonstrated normal sinus rhythm, normal resting conduction, no resting arrhythmias and normal rest repolarization. Stress EKG is non-diagnostic for ischemia as it a pharmacologic stress using Lexiscan. Stress symptoms included dyspnea. Occasional PVC noted. 2. The LV is dilated both at rest and stress images. The LV end diastolic volume was 0000000. SPECT images demonstrate Medium perfusion abnormality of moderate intensity in the basal inferior, mid inferior and apical inferior myocardial wall(s) on the stress  images. The defect remains relatively unchanged between rest and stress images and is a soft tissue attenuation artifact, however scar in this region without ischemia cannot be completely excluded. The left ventricular ejection fraction was calculated or visually estimated to be 25% with global hypokinesis. High risk study.  Abdominal aortic duplex 10/06/2016: Diffuse plaque noted in the proximal, mid and distal aorta. No AAA observed.  Coronary angiogram 11/03/2016: Severe left ventricular systolic dysfunction. The left ventricular ejection fraction is 30-35% by visual estimate with inferior wall akinesis.There is no mitral valve regurgitation. Normal LVEDP. Normal coronary arteries.  Holter Monitor 48 hours 09/07/2017: Minimum heart rate 38 bpm at 12:13 AM maximum heart rate 154 bpm at 12:59 PM. PVCs consisted of 3600 beats, 1.5% burden. Occasional ventricular and triplets, 194 couplets.. Bigeminy and trigeminy. 2 episodes of 4 beat 3 beat NSVT. Predominant rhythm was atrial fibrillation.  PCV ECHOCARDIOGRAM COMPLETE 03/15/2020 Severely depressed LV systolic function with visual EF 20-25%. Left ventricle cavity is moderately dilated. Dilated cardiomyopathy. Mild concentric hypertrophy of the left ventricle. Hypokinetic global wall motion. Doppler evidence of grade I (impaired) diastolic dysfunction, elevated LAP. Left atrial cavity is severely dilated at 4.6 cm. Structurally normal mitral valve.  Mild to moderate mitral regurgitation. Mild to moderate tricuspid regurgitation. Moderate pulmonary hypertension. RVSP measures 44 mmHg. CVP estimated at 15 mm Hg. IVC is dilated with poor inspiration collapse consistent with elevated right atrial pressure. Compared to 02/08/2018, EF has further reduced from 35%.     EKG:   EKG 05/21/2020: Atrial fibrillation with controlled ventricular response at a rate of 58 bpm.  Left axis, left anterior fascicular block.  LVH. Poor R wave progression, cannot  exclude anteroseptal infarct old.Compared to EKG 03/07/2020, no ectopic ventricular beats.  EKG 03/07/2020: Atrial fibrillation with controlled ventricular  response at a rate of 61 beats per with ectopic ventricular beats (2).  Left axis deviation, left anterior fascicular block.  Left ventricular hypertrophy. Poor R wave progression, cannot exclude anterior septal infarct old.  Compared to EKG 09/07/2019, no significant change  Assessment   No diagnosis found.  No orders of the defined types were placed in this encounter.   Medications Discontinued During This Encounter  Medication Reason  . spironolactone (ALDACTONE) 25 MG tablet Change in therapy    Recommendations:   LARRIS POUSSON  is a 80 y.o. Caucasian male patient  with nonischemic cardiomyopathy by coronary angiogram on 11/02/2016 revealing ejection fraction of 30-35%, ongoning tobacco use disorder, and permanent A fib incidentally found in 2018 by Holter monitor. He is asymptomatic in regards to his A fib and is now on anticoagulation.   Patient presents for 42-monthfollow-up of atrial fibrillation after echocardiogram revealed LVEF had further reduced from 35% to 20-25%.    Suspect permanent atrial fibrillation may be contributing to worsening systolic heart failure. Will therefore start patient on amiodarone 200 mg twice daily for 3 weeks, if patient remains in atrial fibrillation will then schedule for cardioversion. Although patient remains asymptomatic from atrial fibrillation, his LVEF has now dropped, therefore feel attempt to maintain sinus rhythm is warranted at this point. Amiodarone will likely increase chances of successful cardioversion.    JAdrian Prows MD, [facility]3/22/2022, 12:16 PM Office: [phone]: [phone]

## 2020-07-30 NOTE — Interval H&P Note (Signed)
History and Physical Interval Note:  07/30/2020 12:17 PM  MUNASAR CONKEY  has presented today for surgery, with the diagnosis of AFIB.  The various methods of treatment have been discussed with the patient and family. After consideration of risks, benefits and other options for treatment, the patient has consented to  Procedure(s): CARDIOVERSION (N/A) as a surgical intervention.  The patient's history has been reviewed, patient examined, no change in status, stable for surgery.  I have reviewed the patient's chart and labs.  Questions were answered to the patient's satisfaction.     [name]

## 2020-08-01 ENCOUNTER — Encounter (HOSPITAL_COMMUNITY): Payer: Self-pay | Admitting: Cardiology

## 2020-08-01 DIAGNOSIS — I5042 Chronic combined systolic (congestive) and diastolic (congestive) heart failure: Secondary | ICD-10-CM | POA: Diagnosis not present

## 2020-08-02 LAB — BASIC METABOLIC PANEL
BUN/Creatinine Ratio: 14 (ref 10–24)
BUN: 18 mg/dL (ref 8–27)
CO2: 22 mmol/L (ref 20–29)
Calcium: 8.6 mg/dL (ref 8.6–10.2)
Chloride: 91 mmol/L — ABNORMAL LOW (ref 96–106)
Creatinine, Ser: 1.32 mg/dL — ABNORMAL HIGH (ref 0.76–1.27)
Glucose: 111 mg/dL — ABNORMAL HIGH (ref 65–99)
Potassium: 4.3 mmol/L (ref 3.5–5.2)
Sodium: 130 mmol/L — ABNORMAL LOW (ref 134–144)
eGFR: 55 mL/min/{1.73_m2} — ABNORMAL LOW (ref >59)

## 2020-08-02 LAB — PRO B NATRIURETIC PEPTIDE: NT-Pro BNP: 2479 pg/mL — ABNORMAL HIGH (ref 0–486)

## 2020-08-02 NOTE — Progress Notes (Signed)
Please advise patient to continue taking lasix 40 mg BID. Will reassess at his office visit with me coming up.

## 2020-08-13 ENCOUNTER — Other Ambulatory Visit: Payer: Self-pay

## 2020-08-13 ENCOUNTER — Ambulatory Visit: Payer: Medicare HMO | Admitting: Student

## 2020-08-13 ENCOUNTER — Encounter: Payer: Self-pay | Admitting: Student

## 2020-08-13 VITALS — BP 118/66 | HR 60 | Temp 97.4°F | Ht 71.0 in | Wt 164.0 lb

## 2020-08-13 DIAGNOSIS — I2510 Atherosclerotic heart disease of native coronary artery without angina pectoris: Secondary | ICD-10-CM

## 2020-08-13 DIAGNOSIS — I480 Paroxysmal atrial fibrillation: Secondary | ICD-10-CM

## 2020-08-13 DIAGNOSIS — I5042 Chronic combined systolic (congestive) and diastolic (congestive) heart failure: Secondary | ICD-10-CM

## 2020-08-13 NOTE — Progress Notes (Signed)
Primary Physician/Referring:  Tamsen Roers, MD  Patient ID: Mario Proctor, male    DOB: 1941-01-20, 80 y.o.   MRN: [id number]  Chief Complaint  Patient presents with  . Atrial Fibrillation  . Post Cardioversion   HPI:    Mario Proctor  is a 80 y.o. Caucasian male patient  with nonischemic cardiomyopathy by coronary angiogram on 11/02/2016 revealing ejection fraction of 30-35%, ongoning tobacco use disorder, and permanent A fib. He is asymptomatic in regards to his A fib and is now on anticoagulation.  Echocardiogram 03/2020 revealed significantly decreased LVEF at 25%.  Although patient remained relatively asymptomatic from an atrial fibrillation standpoint he did have an episode of volume overload with swelling and leaking for which he required diuresis.  Also patient's LVEF on echocardiogram in November 2021 was reduced at 25%.  Patient was therefore started on amiodarone and scheduled for cardioversion.  Patient underwent successful direct-current cardioversion for atrial fibrillation with return to normal sinus rhythm.  He now presents for follow-up.  Patient states he continues to have dyspnea on exertion and fatigue, similar to what he was experiencing prior to cardioversion.  He denies chest pain, orthopnea, leg swelling, PND, syncope, near syncope.  Patient's weight has remained stable.  He continues to take Lasix 20 mg twice daily.  He also unfortunately continues to smoke approximately half a pack per day.  Patient recently established with pulmonology care team, with whom he is supposed to follow-up at some point this month.  Patient is undergoing CT of the chest this week as well.  Past Medical History:  Diagnosis Date  . A-fib ([place])   . Allergy   . CHF (congestive heart failure) ([place])   . Dupuytren contracture    right sm finger  . Myocardial infarction [facility])    Past Surgical History:  Procedure Laterality Date  . CARDIOVERSION N/A 07/30/2020   Procedure: CARDIOVERSION;   Surgeon: [name], MD;  Location: [facility];  Service: Cardiovascular;  Laterality: N/A;  . COLON SURGERY     bowel blockage  . DUPUYTREN CONTRACTURE RELEASE Left 2011  . FASCIECTOMY Right 05/02/2015   Procedure: FASCIECTOMY RIGHT SMALL FINGER;  Surgeon: Daryll Brod, MD;  Location: [place];  Service: Orthopedics;  Laterality: Right;  axillary block in preop  . LEFT HEART CATH AND CORONARY ANGIOGRAPHY N/A 11/03/2016   Procedure: Left Heart Cath and Coronary Angiography;  Surgeon: [name], MD;  Location: [facility];  Service: Cardiovascular;  Laterality: N/A;  . TONSILLECTOMY    . TYMPANOPLASTY Right    Family History  Problem Relation Age of Onset  . Cancer Brother        "in heart and lungs"     Social History   Tobacco Use  . Smoking status: Current Every Day Smoker    Packs/day: 1.00    Years: 57.00    Pack years: 57.00    Types: Cigarettes  . Smokeless tobacco: Never Used  . Tobacco comment: currently smoking .5ppd as of 05/14/20  Substance Use Topics  . Alcohol use: Yes    Comment: social   Marital Status: Married  ROS  Review of Systems  Constitutional: Negative for malaise/fatigue.  Cardiovascular: Positive for dyspnea on exertion (chronic, stable). Negative for chest pain, claudication, leg swelling, near-syncope, orthopnea, palpitations, paroxysmal nocturnal dyspnea and syncope.  Respiratory: Positive for cough (chronic, stable). Negative for shortness of breath.   Endocrine: Negative for cold intolerance.  Hematologic/Lymphatic: Does not bruise/bleed easily.  Musculoskeletal: Negative for joint swelling.  Gastrointestinal: Negative for abdominal pain, anorexia, change in bowel habit, hematochezia and melena.  Neurological: Negative for dizziness, headaches, light-headedness and weakness.  Psychiatric/Behavioral: Negative for depression and substance abuse.   Objective  Blood pressure 118/66, pulse 60, temperature (!) 97.4 F (36.3  C), height '5\' 11"'$  (1.803 m), weight 164 lb (74.4 kg), SpO2 97 %.  Vitals with BMI 08/13/2020 07/30/2020 07/30/2020  Height '5\' 11"'$  - -  Weight 164 lbs - -  BMI 123456 - -  Systolic 123456 [id number] -  Diastolic 66 65 -  Pulse 60 55 55     Physical Exam Constitutional:      Appearance: He is well-developed.  HENT:     Head: Atraumatic.  Eyes:     Conjunctiva/sclera: Conjunctivae normal.  Neck:     Thyroid: No thyromegaly.     Vascular: No JVD.  Cardiovascular:     Rate and Rhythm: Normal rate and regular rhythm.     Pulses: Intact distal pulses.          Carotid pulses are 2+ on the right side and 2+ on the left side.      Radial pulses are 2+ on the right side and 2+ on the left side.       Femoral pulses are 2+ on the right side and 2+ on the left side.      Popliteal pulses are 2+ on the right side and 2+ on the left side.       Dorsalis pedis pulses are 1+ on the right side and 1+ on the left side.       Posterior tibial pulses are 1+ on the right side and 1+ on the left side.     Heart sounds: Heart sounds are distant. No murmur heard. No gallop.      Comments: No JVD.  Pulmonary:     Effort: Pulmonary effort is normal. No accessory muscle usage or respiratory distress.     Breath sounds: Examination of the right-upper field reveals wheezing. Examination of the left-upper field reveals wheezing. Wheezing and rhonchi present.  Musculoskeletal:        General: Normal range of motion.     Cervical back: Neck supple.     Right lower leg: Edema (Trace) present.     Left lower leg: Edema (Trace) present.  Skin:    General: Skin is warm and dry.  Neurological:     Mental Status: He is alert.    Laboratory examination:   Recent Labs    06/12/20 0947 06/20/20 0950 07/04/20 0920 07/23/20 1052 08/01/20 1046  NA 131* 131* 132* 130* 130*  K 5.5* 5.0 5.2 4.2 4.3  CL 95* 94* 93* 92* 91*  CO2 '22 21 27 24 22  '$ GLUCOSE 102* 60* 80 94 111*  BUN '23 21 21 16 18  '$ CREATININE 1.53* 1.49*  1.66* 1.56* 1.32*  CALCIUM 8.7 8.8 9.0 8.7 8.6  GFRNONAA 43* 44* 39*  --   --   GFRAA 50* 51* 45*  --   --    estimated creatinine clearance is 48.5 mL/min (A) (by C-G formula based on SCr of 1.32 mg/dL (H)).  CMP Latest Ref Rng & Units 08/01/2020 07/23/2020 07/04/2020  Glucose 65 - 99 mg/dL 111(H) 94 80  BUN 8 - 27 mg/dL '18 16 21  '$ Creatinine 0.76 - 1.27 mg/dL 1.32(H) 1.56(H) 1.66(H)  Sodium 134 - 144 mmol/L 130(L) 130(L) 132(L)  Potassium 3.5 - 5.2 mmol/L 4.3  4.2 5.2  Chloride 96 - 106 mmol/L 91(L) 92(L) 93(L)  CO2 20 - 29 mmol/L '22 24 27  '$ Calcium 8.6 - 10.2 mg/dL 8.6 8.7 9.0  Total Protein 6.0 - 8.5 g/dL - - -  Total Bilirubin 0.0 - 1.2 mg/dL - - -  Alkaline Phos 44 - 121 IU/L - - -  AST 0 - 40 IU/L - - -  ALT 0 - 44 IU/L - - -   CBC Latest Ref Rng & Units 06/20/2020 09/04/2019 09/01/2018  WBC 3.4 - 10.8 x10E3/uL 7.4 6.8 6.7  Hemoglobin 13.0 - 17.7 g/dL 14.5 15.3 15.6  Hematocrit 37.5 - 51.0 % 42.2 42.9 45.9  Platelets 150 - 450 x10E3/uL 243 209 241   Lipid Panel     Component Value Date/Time   CHOL 139 06/12/2020 0947   TRIG 61 06/12/2020 0947   HDL 66 06/12/2020 0947   LDLCALC 60 06/12/2020 0947   External labs: None  Medications and allergies   No Known Allergies   Current Outpatient Medications  Medication Instructions  . albuterol (ACCUNEB) 1.25 MG/3ML nebulizer solution 1 ampule, Nebulization, Every 4 hours PRN  . albuterol (VENTOLIN HFA) 108 (90 Base) MCG/ACT inhaler INHALE 2 PUFF BY MOUTH EVERY 6 HOURS AS NEEDED FOR WHEEZE OR SHORTNESS OF BREATH  . amiodarone (PACERONE) 200 mg, Oral, Daily  . budesonide (PULMICORT) 0.5 mg, Nebulization, Daily PRN  . ENTRESTO 97-103 MG 1 tablet, Oral, Daily  . furosemide (LASIX) 40 mg, Oral, 2 times daily  . metoprolol succinate (TOPROL-XL) 50 MG 24 hr tablet TAKE 1 TABLET BY MOUTH EVERY DAY  . rosuvastatin (CRESTOR) 5 mg, Oral, Daily  . TRELEGY ELLIPTA 100-62.5-25 MCG/INH AEPB INHALE 1 PUFF INTO THE LUNGS EVERY DAY AS NEEDED   . XARELTO 20 MG TABS tablet TAKE 1 TABLET BY MOUTH EVERY DAY WITH SUPPER   Radiology:   Low-dose CT chest 08/16/2019: Coronary and aortic atherosclerosis and calcification.  Severe centrilobular emphysema.  2 benign looking nodules.  Benign right upper kidney mass.  Cardiac Studies:   Lexiscan myoview stress test 09/18/2016: 1. The resting electrocardiogram demonstrated normal sinus rhythm, normal resting conduction, no resting arrhythmias and normal rest repolarization. Stress EKG is non-diagnostic for ischemia as it a pharmacologic stress using Lexiscan. Stress symptoms included dyspnea. Occasional PVC noted. 2. The LV is dilated both at rest and stress images. The LV end diastolic volume was 0000000. SPECT images demonstrate Medium perfusion abnormality of moderate intensity in the basal inferior, mid inferior and apical inferior myocardial wall(s) on the stress images. The defect remains relatively unchanged between rest and stress images and is a soft tissue attenuation artifact, however scar in this region without ischemia cannot be completely excluded. The left ventricular ejection fraction was calculated or visually estimated to be 25% with global hypokinesis. High risk study.  Abdominal aortic duplex 10/06/2016: Diffuse plaque noted in the proximal, mid and distal aorta. No AAA observed.  Coronary angiogram 11/03/2016: Severe left ventricular systolic dysfunction. The left ventricular ejection fraction is 30-35% by visual estimate with inferior wall akinesis.There is no mitral valve regurgitation. Normal LVEDP. Normal coronary arteries.  Holter Monitor 48 hours 09/07/2017: Minimum heart rate 38 bpm at 12:13 AM maximum heart rate 154 bpm at 12:59 PM. PVCs consisted of 3600 beats, 1.5% burden. Occasional ventricular and triplets, 194 couplets.. Bigeminy and trigeminy. 2 episodes of 4 beat 3 beat NSVT. Predominant rhythm was atrial fibrillation.  PCV ECHOCARDIOGRAM COMPLETE  03/15/2020 Severely depressed LV systolic function with visual EF  20-25%. Left ventricle cavity is moderately dilated. Dilated cardiomyopathy. Mild concentric hypertrophy of the left ventricle. Hypokinetic global wall motion. Doppler evidence of grade I (impaired) diastolic dysfunction, elevated LAP. Left atrial cavity is severely dilated at 4.6 cm. Structurally normal mitral valve.  Mild to moderate mitral regurgitation. Mild to moderate tricuspid regurgitation. Moderate pulmonary hypertension. RVSP measures 44 mmHg. CVP estimated at 15 mm Hg. IVC is dilated with poor inspiration collapse consistent with elevated right atrial pressure. Compared to 02/08/2018, EF has further reduced from 35%.   Direct current cardioversion 07/30/20 : Indication symptomatic A. Fibrillation. Procedure: Using 80 mg of IV Propofol and 60 IV Lidocaine (for reducing venous pain) for achieving deep sedation, synchronized direct current cardioversion performed. Patient was delivered with 150 Joules of electricity X 1 with success to NSR. Patient tolerated the procedure well. No immediate complication noted.   EKG:   EKG 08/13/2020: Sinus bradycardia at a rate of 58 bpm.  Left axis, left anterior fascicular block.  Poor R wave progression, cannot exclude anteroseptal infarct old.  LVH.  Compared to EKG 05/21/2020, now in sinus rhythm.  EKG 05/21/2020: Atrial fibrillation with controlled ventricular response at a rate of 58 bpm.  Left axis, left anterior fascicular block.  LVH. Poor R wave progression, cannot exclude anteroseptal infarct old.Compared to EKG 03/07/2020, no ectopic ventricular beats.  EKG 03/07/2020: Atrial fibrillation with controlled ventricular response at a rate of 61 beats per with ectopic ventricular beats (2).  Left axis deviation, left anterior fascicular block.  Left ventricular hypertrophy. Poor R wave progression, cannot exclude anterior septal infarct old.  Compared to EKG 09/07/2019, no significant  change  Assessment     ICD-10-CM   1. Paroxysmal atrial fibrillation (HCC)  I48.0 EKG 12-Lead  2. Coronary artery calcification seen on CAT scan  I25.10   3. Chronic combined systolic and diastolic CHF (congestive heart failure) (HCC)  123456 Basic metabolic panel    Pro b natriuretic peptide    No orders of the defined types were placed in this encounter.   There are no discontinued medications.  Recommendations:   Mario Proctor  is a 80 y.o.  Caucasian male patient  with nonischemic cardiomyopathy by coronary angiogram on 11/02/2016 revealing ejection fraction of 30-35%, ongoning tobacco use disorder, and permanent A fib. He is asymptomatic in regards to his A fib and is now on anticoagulation.  Echocardiogram 03/2020 revealed significantly decreased LVEF at 25%.  Although patient remained relatively asymptomatic from an atrial fibrillation standpoint he did have an episode of volume overload with swelling and leaking for which he required diuresis.  Also patient's LVEF on echocardiogram in November 2021 was reduced at 25%.  Patient was therefore started on amiodarone and scheduled for cardioversion.  Patient underwent successful direct-current cardioversion for atrial fibrillation with return to normal sinus rhythm.  He now presents for follow-up.  Patient is presently maintaining sinus rhythm.  Patient reports symptoms of fatigue and dyspnea on exertion did not significantly improve following cardioversion.  Patient has underlying COPD and continues to smoke.  Suspect dyspnea to be related to underlying lung pathology rather than cardiac arrhythmia.  Patient appears euvolemic on exam at this time, there are no clinical signs of acute decompensated heart failure.  Recommend patient keep follow-up with pulmonology team, could consider further ischemic evaluation from a cardiac standpoint if pulmonology does not feel dyspnea is related to underlying lung disease.  Patient is presently continue  to take Lasix 20 mg twice daily due to  recent elevated BNP and volume overload.  We will repeat BNP and BMP in order to evaluate renal function as well as continue need for diuresis.  Will consider reducing Lasix to once daily or as needed pending results of BNP and BMP.  Unfortunately patient continues to smoke, I again discussed smoking cessation at length with patient.  He does not appear motivated to quit.  Patient is otherwise stable from a cardiovascular standpoint, therefore will not make changes to his medications at this time.  As patient is on amiodarone, he will need annual monitoring of PFTs, liver function, and thyroid function.  Follow-up in 3 months, sooner if needed, for A. fib and heart failure.   Alethia Berthold, PA-C 08/13/2020, 4:13 PM Office: [phone]

## 2020-08-14 LAB — BASIC METABOLIC PANEL
BUN/Creatinine Ratio: 15 (ref 10–24)
BUN: 21 mg/dL (ref 8–27)
CO2: 24 mmol/L (ref 20–29)
Calcium: 8.9 mg/dL (ref 8.6–10.2)
Chloride: 93 mmol/L — ABNORMAL LOW (ref 96–106)
Creatinine, Ser: 1.37 mg/dL — ABNORMAL HIGH (ref 0.76–1.27)
Glucose: 47 mg/dL — ABNORMAL LOW (ref 65–99)
Potassium: 4.5 mmol/L (ref 3.5–5.2)
Sodium: 134 mmol/L (ref 134–144)
eGFR: 53 mL/min/{1.73_m2} — ABNORMAL LOW (ref >59)

## 2020-08-14 LAB — PRO B NATRIURETIC PEPTIDE: NT-Pro BNP: 4005 pg/mL — ABNORMAL HIGH (ref 0–486)

## 2020-08-15 ENCOUNTER — Other Ambulatory Visit: Payer: Self-pay | Admitting: Student

## 2020-08-15 MED ORDER — TORSEMIDE 40 MG PO TABS: 40.0000 mg | ORAL_TABLET | Freq: Two times a day (BID) | ORAL | 3 refills | Status: DC

## 2020-08-15 NOTE — Addendum Note (Signed)
Addended by: Rosezena Sensor on: 08/15/2020 05:36 PM   Modules accepted: Orders

## 2020-08-15 NOTE — Progress Notes (Signed)
Advised patient to stop lasix and swtich to torsemide 40 mg BID. Repeat BMP and BNP in 2 weeks.

## 2020-08-16 ENCOUNTER — Ambulatory Visit
Admission: RE | Admit: 2020-08-16 | Discharge: 2020-08-16 | Disposition: A | Discharge status: Home / Self Care | Payer: Medicare HMO | Source: Ambulatory Visit | Attending: Acute Care | Admitting: Acute Care

## 2020-08-16 ENCOUNTER — Other Ambulatory Visit: Payer: Self-pay

## 2020-08-16 DIAGNOSIS — Z87891 Personal history of nicotine dependence: Secondary | ICD-10-CM

## 2020-08-16 DIAGNOSIS — J984 Other disorders of lung: Secondary | ICD-10-CM | POA: Diagnosis not present

## 2020-08-16 DIAGNOSIS — J432 Centrilobular emphysema: Secondary | ICD-10-CM | POA: Diagnosis not present

## 2020-08-16 DIAGNOSIS — I2510 Atherosclerotic heart disease of native coronary artery without angina pectoris: Secondary | ICD-10-CM | POA: Diagnosis not present

## 2020-08-16 DIAGNOSIS — F17210 Nicotine dependence, cigarettes, uncomplicated: Secondary | ICD-10-CM

## 2020-08-19 ENCOUNTER — Telehealth: Payer: Self-pay | Admitting: Acute Care

## 2020-08-19 NOTE — Telephone Encounter (Signed)
Spoke with Dianne at [facility] radiology  Call report on lung cancer screening CT Chest done 08/17/20  Impression:  IMPRESSION: 1. Lung-RADS 4B, suspicious. New 17.4 mm irregular nodular opacity in the right lower lobe. Given that airways proximal to this lesion in the right lower lobe are impacted, this may be infectious/inflammatory or aspiration related. Additional imaging evaluation or consultation with Pulmonology or Thoracic Surgery recommended. 2. 1.9 cm exophytic lesion upper pole right kidney has increased in size from 1.6 cm previously. This was characterized as hemorrhagic/proteinaceous cyst on CT scan of 08/28/2019. 3. Aortic Atherosclerosis (ICD10-I70.0) and Emphysema (ICD10-J43.9).  These results will be called to the ordering clinician or representative by the Radiologist Assistant, and communication documented in the PACS or [employer].   Electronically Signed   By: Misty Stanley M.D.   On: 08/17/2020 16:52  Forwarding to Judson Roch marked as urgent

## 2020-08-19 NOTE — Telephone Encounter (Signed)
Do you think this is  Infective and should we just get a 3 month scan after treating? Let me know your thoughts. Thanks so much   IMPRESSION: 1. Lung-RADS 4B, suspicious. New 17.4 mm irregular nodular opacity in the right lower lobe. Given that airways proximal to this lesion in the right lower lobe are impacted, this may be infectious/inflammatory or aspiration related. Additional imaging evaluation or consultation with Pulmonology or Thoracic Surgery recommended. 2. 1.9 cm exophytic lesion upper pole right kidney has increased in size from 1.6 cm previously. This was characterized as hemorrhagic/proteinaceous cyst on CT scan of 08/28/2019. 3. Aortic Atherosclerosis (ICD10-I70.0) and Emphysema (ICD10-J43.9).  These results will be called to the ordering clinician or representative by the Radiologist Assistant, and communication documented in the PACS or [employer].

## 2020-08-19 NOTE — Telephone Encounter (Signed)
This will be called through the screening program. Thanks so much. 

## 2020-08-20 NOTE — Progress Notes (Signed)
I have discussed the results of this scan with Dr. Lamonte Sakai. Langley Gauss, Triage can we add this patient to my schedule tomorrow for a tele visit to discuss results? I have one opening left at 11:30 I believe. Thanks so much

## 2020-08-21 ENCOUNTER — Telehealth: Payer: Self-pay

## 2020-08-21 ENCOUNTER — Ambulatory Visit (INDEPENDENT_AMBULATORY_CARE_PROVIDER_SITE_OTHER): Payer: Medicare HMO | Admitting: Acute Care

## 2020-08-21 ENCOUNTER — Other Ambulatory Visit: Payer: Self-pay

## 2020-08-21 ENCOUNTER — Encounter: Payer: Self-pay | Admitting: Acute Care

## 2020-08-21 DIAGNOSIS — I5042 Chronic combined systolic (congestive) and diastolic (congestive) heart failure: Secondary | ICD-10-CM

## 2020-08-21 DIAGNOSIS — J069 Acute upper respiratory infection, unspecified: Secondary | ICD-10-CM | POA: Diagnosis not present

## 2020-08-21 DIAGNOSIS — R9389 Abnormal findings on diagnostic imaging of other specified body structures: Secondary | ICD-10-CM | POA: Diagnosis not present

## 2020-08-21 MED ORDER — DOXYCYCLINE HYCLATE 100 MG PO TABS: 100.0000 mg | ORAL_TABLET | Freq: Two times a day (BID) | ORAL | 0 refills | Status: DC

## 2020-08-21 NOTE — Telephone Encounter (Signed)
Patient called that he recently was started on Torsemide last week and he has been feeling dizzy other than the dizziness he doesn't feel any other symptom please advise

## 2020-08-21 NOTE — Telephone Encounter (Signed)
Please get his blood pressure and heart rate readings if possible.

## 2020-08-21 NOTE — Progress Notes (Signed)
Virtual Visit via Video Note  I connected with Mario Proctor on 08/21/20 at 11:30 AM EDT by a video enabled telemedicine application and verified that I am speaking with the correct person using two identifiers.  Location: Patient: At Home Provider: [place], [place], [place], [address]   I discussed the limitations of evaluation and management by telemedicine and the availability of in person appointments. The patient expressed understanding and agreed to proceed.  80 year old male current every day smoker with a 57 pack year smoking history. He is followed through the [facility]. Follow up for a Lung RADS 4B.  History of Present Illness: Pt. Presents for follow up to review results of his low dose CT Scan. His scan was read as a LR 4B. There was notation of a new 17.4 mm irregular nodular opacity in the RLL. Proximal to the lesion in the RLL there is an impacted airway. This may be infection, inflammation of aspiration related. The patient states he does not get choked while eating or drinking. He denies any coughing or having to double or triple swallow while eating or drinking. He states he is at his baseline. States he is feeling good. Does not recall any fever, or change in his baseline respiratory symptoms. I reviewed the scan with Dr. Lamonte Sakai. We felt the best option was to address the impaction and re-scan at a short interval.   I discussed this with the patient. We  discussed that we need to re-evaluate the nodule noted in 8 weeks after treatment with antibiotics, mucolytic's and flutter valve. He has verbalized understanding. He will come to pick up his flutter valve and Mucinex samples, and instructions tomorrow. He will be instructed on flutter valve use when he comes to pick it up 08/22/2020.      Observations/Objective: Low Dose CT Chest for Lung Cancer Screening 08/17/2020 Lung-RADS 4B, suspicious. New 17.4 mm irregular nodular opacity in the right  lower lobe. Given that airways proximal to this lesion in the right lower lobe are impacted, this may be infectious/inflammatory or aspiration related. Additional imaging evaluation or consultation with Pulmonology or Thoracic Surgery recommended. 2. 1.9 cm exophytic lesion upper pole right kidney has increased in size from 1.6 cm previously. This was characterized as hemorrhagic/proteinaceous cyst on CT scan of 08/28/2019. 3. Aortic Atherosclerosis (ICD10-I70.0) and Emphysema (ICD10-J43.9).  Assessment and Plan: Lung Rads 4 B LDCT with ? Of infection vs inflammation in  Setting of right lower lobe are impacted, this may be infectious/inflammatory or aspiration related. Plan We will send In antibiotics to see if we can clear up your lungs. Doxycycline 100 mg twice daily for 7 days Use sunblock if you are in the sun while on this medication.  Take a probiotic daily while on antibiotics, or eat yogurt daily while on antibiotics.  Please start Mucinex 1200 mg ( two 600 mg tablets) daily with a full glass of water.  Do this for 8 weeks. We will provide you with samples of Mucinex. Then use the flutter valve 3 times a day ( 5-6 blows at a time) for 8 weeks We will have a nurse go over how to use the Flutter valve when you pick it up.  We will repeat the CT scan of the chest  in 8 weeks to see if this has changes the appearance of the nodule we saw on your earlier CT scan.  Call if you have any questions call the office at [phone].  We will have a flutter valve , Mucinex samples ( with coupons ) and your after visit summery/ instructions here for pick up when you come tomorrow.  Please contact office for sooner follow up if symptoms do not improve or worsen or seek emergency care   Follow Up Instructions: Follow up CT Chest without contrast in 8 weeks Call the office  if you have any quesations   I discussed the assessment and treatment plan with the patient. The patient was provided an  opportunity to ask questions and all were answered. The patient agreed with the plan and demonstrated an understanding of the instructions.   The patient was advised to call back or seek an in-person evaluation if the symptoms worsen or if the condition fails to improve as anticipated.  I provided 35 minutes of non-face-to-face time during this encounter.   Magdalen Spatz, NP 08/21/2020

## 2020-08-21 NOTE — Patient Instructions (Addendum)
It is good to see you today. You had an abnormal CT scan of your chest. We will send In antibiotics to see if we can clear up your lungs. Doxycycline 100 mg twice daily for 7 days Use sunblock if you are in the sun while on this medication.  Take a probiotic daily while on antibiotics, or eat yogurt daily while on antibiotics.  Please start Mucinex 1200 mg ( two 600 mg tablets) daily with a full glass of water.  Do this for 8 weeks. We will provide you with samples of Mucinex. Then use the flutter valve 3 times a day ( 5-6 blows at a time) for 8 weeks We will have a nurse go over how to use the Flutter valve when you pick it up.  We will repeat the CT scan without contrast  of the chest  in 8 weeks to see if this has changes the appearance of the nodule we saw on your earlier CT scan.  Call if you have any questions call the office at [phone]. We will have a flutter valve , Mucinex samples ( with coupons ) and your after visit summery/ instructions here for pick up when you come tomorrow.  Please contact office for sooner follow up if symptoms do not improve or worsen or seek emergency care

## 2020-08-22 ENCOUNTER — Other Ambulatory Visit: Payer: Self-pay | Admitting: *Deleted

## 2020-08-22 ENCOUNTER — Other Ambulatory Visit: Payer: Self-pay | Admitting: Student

## 2020-08-22 ENCOUNTER — Telehealth: Payer: Self-pay | Admitting: Acute Care

## 2020-08-22 DIAGNOSIS — J069 Acute upper respiratory infection, unspecified: Secondary | ICD-10-CM

## 2020-08-22 DIAGNOSIS — R9389 Abnormal findings on diagnostic imaging of other specified body structures: Secondary | ICD-10-CM

## 2020-08-22 NOTE — Telephone Encounter (Signed)
See ov note 08/21/20.  Order placed for Chest CT W/O contrast in 8 weeks. Nothing further needed.

## 2020-08-22 NOTE — Telephone Encounter (Signed)
Will route to [facility] to follow up on forms for SG.

## 2020-08-22 NOTE — Telephone Encounter (Signed)
Have him reduce torsemide to 20 mg twice daily. Verify he is not also taking furosemide.

## 2020-08-22 NOTE — Telephone Encounter (Signed)
Spoke to patient he voiced understanding

## 2020-08-22 NOTE — Telephone Encounter (Signed)
Of course, I asked him yesterday but he hadn't because he doesn't know how to his wife is the one who does and she wasn't home but I told him to call this morning with his readings. He said yesterday evening it was 97/60 and today morning it was 121/60

## 2020-08-24 ENCOUNTER — Other Ambulatory Visit: Payer: Self-pay | Admitting: Cardiology

## 2020-08-24 DIAGNOSIS — I2510 Atherosclerotic heart disease of native coronary artery without angina pectoris: Secondary | ICD-10-CM

## 2020-08-26 ENCOUNTER — Other Ambulatory Visit: Payer: Self-pay | Admitting: Acute Care

## 2020-08-26 DIAGNOSIS — J449 Chronic obstructive pulmonary disease, unspecified: Secondary | ICD-10-CM

## 2020-08-26 NOTE — Telephone Encounter (Signed)
I have received form. Thanks!

## 2020-08-28 DIAGNOSIS — H2512 Age-related nuclear cataract, left eye: Secondary | ICD-10-CM | POA: Diagnosis not present

## 2020-08-28 DIAGNOSIS — H25812 Combined forms of age-related cataract, left eye: Secondary | ICD-10-CM | POA: Diagnosis not present

## 2020-08-29 ENCOUNTER — Inpatient Hospital Stay (HOSPITAL_COMMUNITY)
Admission: EM | Admit: 2020-08-29 | Discharge: 2020-08-31 | DRG: 641 | Disposition: A | Discharge status: Home / Self Care | Payer: Medicare HMO | Attending: Internal Medicine | Admitting: Internal Medicine

## 2020-08-29 ENCOUNTER — Emergency Department (HOSPITAL_COMMUNITY): Payer: Medicare HMO

## 2020-08-29 ENCOUNTER — Encounter (HOSPITAL_COMMUNITY): Payer: Self-pay | Admitting: Emergency Medicine

## 2020-08-29 DIAGNOSIS — N179 Acute kidney failure, unspecified: Secondary | ICD-10-CM | POA: Diagnosis present

## 2020-08-29 DIAGNOSIS — S0191XA Laceration without foreign body of unspecified part of head, initial encounter: Secondary | ICD-10-CM

## 2020-08-29 DIAGNOSIS — E860 Dehydration: Secondary | ICD-10-CM

## 2020-08-29 DIAGNOSIS — R55 Syncope and collapse: Secondary | ICD-10-CM | POA: Diagnosis not present

## 2020-08-29 DIAGNOSIS — Z23 Encounter for immunization: Secondary | ICD-10-CM | POA: Diagnosis not present

## 2020-08-29 DIAGNOSIS — Z7901 Long term (current) use of anticoagulants: Secondary | ICD-10-CM

## 2020-08-29 DIAGNOSIS — S01111A Laceration without foreign body of right eyelid and periocular area, initial encounter: Secondary | ICD-10-CM | POA: Diagnosis present

## 2020-08-29 DIAGNOSIS — F17210 Nicotine dependence, cigarettes, uncomplicated: Secondary | ICD-10-CM | POA: Diagnosis present

## 2020-08-29 DIAGNOSIS — Y92009 Unspecified place in unspecified non-institutional (private) residence as the place of occurrence of the external cause: Secondary | ICD-10-CM

## 2020-08-29 DIAGNOSIS — S0990XA Unspecified injury of head, initial encounter: Secondary | ICD-10-CM | POA: Diagnosis not present

## 2020-08-29 DIAGNOSIS — J449 Chronic obstructive pulmonary disease, unspecified: Secondary | ICD-10-CM

## 2020-08-29 DIAGNOSIS — W1830XA Fall on same level, unspecified, initial encounter: Secondary | ICD-10-CM | POA: Diagnosis present

## 2020-08-29 DIAGNOSIS — M79641 Pain in right hand: Secondary | ICD-10-CM | POA: Diagnosis not present

## 2020-08-29 DIAGNOSIS — I2510 Atherosclerotic heart disease of native coronary artery without angina pectoris: Secondary | ICD-10-CM | POA: Diagnosis present

## 2020-08-29 DIAGNOSIS — I130 Hypertensive heart and chronic kidney disease with heart failure and stage 1 through stage 4 chronic kidney disease, or unspecified chronic kidney disease: Secondary | ICD-10-CM | POA: Diagnosis present

## 2020-08-29 DIAGNOSIS — I509 Heart failure, unspecified: Secondary | ICD-10-CM

## 2020-08-29 DIAGNOSIS — I700 Atherosclerosis of aorta: Secondary | ICD-10-CM | POA: Diagnosis not present

## 2020-08-29 DIAGNOSIS — R58 Hemorrhage, not elsewhere classified: Secondary | ICD-10-CM | POA: Diagnosis not present

## 2020-08-29 DIAGNOSIS — Z20822 Contact with and (suspected) exposure to covid-19: Secondary | ICD-10-CM | POA: Diagnosis present

## 2020-08-29 DIAGNOSIS — Z743 Need for continuous supervision: Secondary | ICD-10-CM | POA: Diagnosis not present

## 2020-08-29 DIAGNOSIS — Z79899 Other long term (current) drug therapy: Secondary | ICD-10-CM

## 2020-08-29 DIAGNOSIS — I5042 Chronic combined systolic (congestive) and diastolic (congestive) heart failure: Secondary | ICD-10-CM | POA: Diagnosis not present

## 2020-08-29 DIAGNOSIS — I252 Old myocardial infarction: Secondary | ICD-10-CM

## 2020-08-29 DIAGNOSIS — R040 Epistaxis: Secondary | ICD-10-CM | POA: Diagnosis present

## 2020-08-29 DIAGNOSIS — N189 Chronic kidney disease, unspecified: Secondary | ICD-10-CM

## 2020-08-29 DIAGNOSIS — R4020 Unspecified coma: Secondary | ICD-10-CM | POA: Diagnosis not present

## 2020-08-29 DIAGNOSIS — Z043 Encounter for examination and observation following other accident: Secondary | ICD-10-CM | POA: Diagnosis not present

## 2020-08-29 DIAGNOSIS — M47816 Spondylosis without myelopathy or radiculopathy, lumbar region: Secondary | ICD-10-CM | POA: Diagnosis not present

## 2020-08-29 DIAGNOSIS — I480 Paroxysmal atrial fibrillation: Secondary | ICD-10-CM

## 2020-08-29 HISTORY — DX: Unspecified intestinal obstruction, unspecified as to partial versus complete obstruction: K56.609

## 2020-08-29 LAB — COMPREHENSIVE METABOLIC PANEL
ALT: 36 U/L (ref 0–44)
AST: 43 U/L — ABNORMAL HIGH (ref 15–41)
Albumin: 3.6 g/dL (ref 3.5–5.0)
Alkaline Phosphatase: 91 U/L (ref 38–126)
Anion gap: 10 (ref 5–15)
BUN: 45 mg/dL — ABNORMAL HIGH (ref 8–23)
CO2: 30 mmol/L (ref 22–32)
Calcium: 8.9 mg/dL (ref 8.9–10.3)
Chloride: 95 mmol/L — ABNORMAL LOW (ref 98–111)
Creatinine, Ser: 2.42 mg/dL — ABNORMAL HIGH (ref 0.61–1.24)
GFR, Estimated: 27 mL/min — ABNORMAL LOW (ref >60)
Glucose, Bld: 95 mg/dL (ref 70–99)
Potassium: 5 mmol/L (ref 3.5–5.1)
Sodium: 135 mmol/L (ref 135–145)
Total Bilirubin: 0.8 mg/dL (ref 0.3–1.2)
Total Protein: 7 g/dL (ref 6.5–8.1)

## 2020-08-29 LAB — CBC WITH DIFFERENTIAL/PLATELET
Abs Immature Granulocytes: 0.03 10*3/uL (ref 0.00–0.07)
Basophils Absolute: 0.1 10*3/uL (ref 0.0–0.1)
Basophils Relative: 1 %
Eosinophils Absolute: 0.2 10*3/uL (ref 0.0–0.5)
Eosinophils Relative: 3 %
HCT: 45.8 % (ref 39.0–52.0)
Hemoglobin: 15.1 g/dL (ref 13.0–17.0)
Immature Granulocytes: 0 %
Lymphocytes Relative: 24 %
Lymphs Abs: 2.1 10*3/uL (ref 0.7–4.0)
MCH: 32.1 pg (ref 26.0–34.0)
MCHC: 33 g/dL (ref 30.0–36.0)
MCV: 97.2 fL (ref 80.0–100.0)
Monocytes Absolute: 0.7 10*3/uL (ref 0.1–1.0)
Monocytes Relative: 9 %
Neutro Abs: 5.4 10*3/uL (ref 1.7–7.7)
Neutrophils Relative %: 63 %
Platelets: 232 10*3/uL (ref 150–400)
RBC: 4.71 MIL/uL (ref 4.22–5.81)
RDW: 12.7 % (ref 11.5–15.5)
WBC: 8.5 10*3/uL (ref 4.0–10.5)
nRBC: 0 % (ref 0.0–0.2)

## 2020-08-29 LAB — PROTIME-INR
INR: 2.1 — ABNORMAL HIGH (ref 0.8–1.2)
Prothrombin Time: 23.3 seconds — ABNORMAL HIGH (ref 11.4–15.2)

## 2020-08-29 MED ORDER — LIDOCAINE-EPINEPHRINE (PF) 2 %-1:200000 IJ SOLN
20.0000 mL | Freq: Once | INTRAMUSCULAR | Status: AC
  Administered 2020-08-29: 20 mL
  Filled 2020-08-29: qty 20

## 2020-08-29 MED ORDER — SODIUM CHLORIDE 0.9 % IV BOLUS
1000.0000 mL | Freq: Once | INTRAVENOUS | Status: AC
  Administered 2020-08-29: 1000 mL via INTRAVENOUS

## 2020-08-29 MED ORDER — TETANUS-DIPHTH-ACELL PERTUSSIS 5-2.5-18.5 LF-MCG/0.5 IM SUSY
0.5000 mL | PREFILLED_SYRINGE | Freq: Once | INTRAMUSCULAR | Status: AC
  Administered 2020-08-29: 0.5 mL via INTRAMUSCULAR
  Filled 2020-08-29: qty 0.5

## 2020-08-29 MED ORDER — OXYMETAZOLINE HCL 0.05 % NA SOLN
1.0000 | Freq: Once | NASAL | Status: AC
  Administered 2020-08-29: 1 via NASAL
  Filled 2020-08-29: qty 30

## 2020-08-29 NOTE — ED Notes (Signed)
Pt calls out and reports having nose bleeding, Dr. Darl Householder made aware.

## 2020-08-29 NOTE — Progress Notes (Signed)
   08/29/20 2201  Clinical Encounter Type  Visited With Patient not available  Visit Type Initial;Trauma  Referral From Nurse  Consult/Referral To Chaplain   Chaplain responded to Level 2 trauma. Patient unavailable and no support person present. Chaplain not currently needed. Chaplain remains available.  This note was prepared by Chaplain Resident, Dante Gang, MDiv. Chaplain remains available as needed through the on-call pager: [phone].

## 2020-08-29 NOTE — ED Provider Notes (Signed)
[facility] EMERGENCY DEPARTMENT Provider Note   CSN: [id number] Arrival date & time: 08/29/20  2213     History No chief complaint on file.   Mario Proctor is a 80 y.o. male.  Patient presents to the ED with a chief complaint of fall and head injury.  He states that he was walking down the hall, became dizzy, and passed out striking his head.  He denies numbness, weakness, or tingling.  He denies slurred speech or vision changes. He is anticoagulated on Xarelto.  EMS reports he was hypotensive to the 60s upon their arrival.  Reports having adjusted his lasix due to dizziness.  The history is provided by the patient. No language interpreter was used.       No past medical history on file.  There are no problems to display for this patient.   History reviewed. No pertinent surgical history.     No family history on file.     Home Medications Prior to Admission medications   Not on File    Allergies    Patient has no allergy information on record.  Review of Systems   Review of Systems  All other systems reviewed and are negative.   Physical Exam Updated Vital Signs BP (!) 141/75 (BP Location: Left Arm)   Pulse (!) 58   Temp (!) 96.1 F (35.6 C) (Temporal)   Resp (!) 21   SpO2 97%   Physical Exam Vitals and nursing note reviewed.  Constitutional:      Appearance: He is well-developed.  HENT:     Head: Normocephalic and atraumatic.     Comments: 3 cm laceration to right eyebrow    Ears:     Comments: Normal TMs Eyes:     Conjunctiva/sclera: Conjunctivae normal.  Neck:     Comments: No cervical spine tenderness Cardiovascular:     Rate and Rhythm: Normal rate and regular rhythm.     Heart sounds: No murmur heard.   Pulmonary:     Effort: Pulmonary effort is normal. No respiratory distress.     Breath sounds: Normal breath sounds.  Abdominal:     Palpations: Abdomen is soft.     Tenderness: There is no abdominal tenderness.   Musculoskeletal:     Cervical back: Neck supple.     Comments: Right hand and thumb mildly TTP  Skin:    General: Skin is warm and dry.  Neurological:     Mental Status: He is alert and oriented to person, place, and time.  Psychiatric:        Mood and Affect: Mood normal.        Behavior: Behavior normal.     ED Results / Procedures / Treatments   Labs (all labs ordered are listed, but only abnormal results are displayed) Labs Reviewed  COMPREHENSIVE METABOLIC PANEL - Abnormal; Notable for the following components:      Result Value   Chloride 95 (*)    BUN 45 (*)    Creatinine, Ser 2.42 (*)    AST 43 (*)    GFR, Estimated 27 (*)    All other components within normal limits  PROTIME-INR - Abnormal; Notable for the following components:   Prothrombin Time 23.3 (*)    INR 2.1 (*)    All other components within normal limits  CBC WITH DIFFERENTIAL/PLATELET  URINALYSIS, ROUTINE W REFLEX MICROSCOPIC    EKG None  Radiology CT Head Wo Contrast  Result Date: 08/29/2020 CLINICAL  DATA:  Syncope and fall EXAM: CT HEAD WITHOUT CONTRAST CT CERVICAL SPINE WITHOUT CONTRAST TECHNIQUE: Multidetector CT imaging of the head and cervical spine was performed following the standard protocol without intravenous contrast. Multiplanar CT image reconstructions of the cervical spine were also generated. COMPARISON:  None. FINDINGS: CT HEAD FINDINGS Brain: There is no mass, hemorrhage or extra-axial collection. The size and configuration of the ventricles and extra-axial CSF spaces are normal. There is hypoattenuation of the periventricular white matter, most commonly indicating chronic ischemic microangiopathy. Vascular: No abnormal hyperdensity of the major intracranial arteries or dural venous sinuses. No intracranial atherosclerosis. Skull: Small right frontal scalp hematoma.  No skull fracture. Sinuses/Orbits: No fluid levels or advanced mucosal thickening of the visualized paranasal sinuses. No  mastoid or middle ear effusion. The orbits are normal. CT CERVICAL SPINE FINDINGS Alignment: No static subluxation. Facets are aligned. Occipital condyles are normally positioned. Skull base and vertebrae: No acute fracture. Soft tissues and spinal canal: No prevertebral fluid or swelling. No visible canal hematoma. Disc levels: Severe disc height loss at C3-5. Upper chest: No pneumothorax, pulmonary nodule or pleural effusion. Other: Normal visualized paraspinal cervical soft tissues. IMPRESSION: 1. Advanced chronic ischemic microangiopathy without acute intracranial abnormality. 2. No acute fracture or static subluxation of the cervical spine. 3. Small right frontal scalp hematoma. Electronically Signed   By: Ulyses Jarred M.D.   On: 08/29/2020 23:05   CT Cervical Spine Wo Contrast  Result Date: 08/29/2020 CLINICAL DATA:  Syncope and fall EXAM: CT HEAD WITHOUT CONTRAST CT CERVICAL SPINE WITHOUT CONTRAST TECHNIQUE: Multidetector CT imaging of the head and cervical spine was performed following the standard protocol without intravenous contrast. Multiplanar CT image reconstructions of the cervical spine were also generated. COMPARISON:  None. FINDINGS: CT HEAD FINDINGS Brain: There is no mass, hemorrhage or extra-axial collection. The size and configuration of the ventricles and extra-axial CSF spaces are normal. There is hypoattenuation of the periventricular white matter, most commonly indicating chronic ischemic microangiopathy. Vascular: No abnormal hyperdensity of the major intracranial arteries or dural venous sinuses. No intracranial atherosclerosis. Skull: Small right frontal scalp hematoma.  No skull fracture. Sinuses/Orbits: No fluid levels or advanced mucosal thickening of the visualized paranasal sinuses. No mastoid or middle ear effusion. The orbits are normal. CT CERVICAL SPINE FINDINGS Alignment: No static subluxation. Facets are aligned. Occipital condyles are normally positioned. Skull base and  vertebrae: No acute fracture. Soft tissues and spinal canal: No prevertebral fluid or swelling. No visible canal hematoma. Disc levels: Severe disc height loss at C3-5. Upper chest: No pneumothorax, pulmonary nodule or pleural effusion. Other: Normal visualized paraspinal cervical soft tissues. IMPRESSION: 1. Advanced chronic ischemic microangiopathy without acute intracranial abnormality. 2. No acute fracture or static subluxation of the cervical spine. 3. Small right frontal scalp hematoma. Electronically Signed   By: Ulyses Jarred M.D.   On: 08/29/2020 23:05   DG Pelvis Portable  Result Date: 08/29/2020 CLINICAL DATA:  Recent fall EXAM: PORTABLE PELVIS 1 VIEWS COMPARISON:  None. FINDINGS: Pelvic ring is intact. No acute fracture or dislocation is noted. Degenerative changes of lumbar spine are noted. IMPRESSION: No acute abnormality noted. Electronically Signed   By: Inez Catalina M.D.   On: 08/29/2020 22:40   DG Chest Portable 1 View  Result Date: 08/29/2020 CLINICAL DATA:  Recent fall, initial encounter EXAM: PORTABLE CHEST 1 VIEW COMPARISON:  05/14/2020 FINDINGS: Cardiac shadow is within normal limits. Aortic calcifications are seen. The lungs are well aerated bilaterally. No bony abnormality is seen.  IMPRESSION: No active disease. Electronically Signed   By: Inez Catalina M.D.   On: 08/29/2020 22:41   DG Hand Complete Right  Result Date: 08/29/2020 CLINICAL DATA:  Recent fall with right hand pain, initial encounter EXAM: RIGHT HAND - COMPLETE 3+ VIEW COMPARISON:  None. FINDINGS: Widening of the scapholunate space is noted likely related to chronic ligamentous injury. Bone cyst is noted in the distal aspect radius without complicating factors. Degenerative changes of the distal radioulnar joint are seen. No acute fracture or dislocation is noted. No soft tissue abnormality is noted. IMPRESSION: Chronic appearing changes without acute abnormality. Electronically Signed   By: Inez Catalina M.D.   On:  08/29/2020 22:39    Procedures .Marland KitchenLaceration Repair  Date/Time: 08/30/2020 12:14 AM Performed by: Montine Circle, PA-C Authorized by: Montine Circle, PA-C   Consent:    Consent obtained:  Verbal   Consent given by:  Patient   Risks discussed:  Infection, need for additional repair, pain, poor cosmetic result and poor wound healing   Alternatives discussed:  No treatment and delayed treatment Universal protocol:    Procedure explained and questions answered to patient or proxy's satisfaction: yes     Relevant documents present and verified: yes     Test results available: yes     Imaging studies available: yes     Required blood products, implants, devices, and special equipment available: yes     Site/side marked: yes     Immediately prior to procedure, a time out was called: yes     Patient identity confirmed:  Verbally with patient Anesthesia:    Anesthesia method:  Local infiltration   Local anesthetic:  Lidocaine 1% WITH epi Laceration details:    Location:  Face   Face location:  R eyebrow   Length (cm):  3 Pre-procedure details:    Preparation:  Patient was prepped and draped in usual sterile fashion Exploration:    Imaging outcome: foreign body not noted     Wound exploration: wound explored through full range of motion and entire depth of wound visualized     Contaminated: no   Treatment:    Area cleansed with:  Saline   Irrigation solution:  Sterile saline   Irrigation method:  Syringe Skin repair:    Repair method:  Sutures   Suture size:  4-0   Wound skin closure material used: vicryl rapide.   Suture technique:  Simple interrupted   Number of sutures:  10 Approximation:    Approximation:  Close Repair type:    Repair type:  Simple Post-procedure details:    Dressing:  Antibiotic ointment and adhesive bandage   Procedure completion:  Tolerated well, no immediate complications     Medications Ordered in ED Medications  sodium chloride 0.9 % bolus  1,000 mL (has no administration in time range)  Tdap (BOOSTRIX) injection 0.5 mL (has no administration in time range)  lidocaine-EPINEPHrine (XYLOCAINE W/EPI) 2 %-1:200000 (PF) injection 20 mL (has no administration in time range)    ED Course  I have reviewed the triage vital signs and the nursing notes.  Pertinent labs & imaging results that were available during my care of the patient were reviewed by me and considered in my medical decision making (see chart for details).    MDM Rules/Calculators/A&P                          Patient brought to emergency department by EMS after  a syncopal episode at home.  EMS reports initial blood pressure was 66/40 at home.  Patient has adjusted his Lasix recently because of dizziness/lightheadedness.  He is anticoagulated on Xarelto.  Blood pressures have been stable on the emergency department.  Creatinine is noted to be 2.42, but was 1.34 2 weeks ago.  Concern for AKI.  Hypotension, dizziness, and AKI likely secondary to overdiuresis.  CT head and cervical spine showed no acute traumatic findings.  Laceration repaired with sutures.  Patient seen by and discussed with Dr. Darl Householder, who recommends admission.  Appreciate Dr. Tonie Griffith for admitting. Final Clinical Impression(s) / ED Diagnoses Final diagnoses:  Syncope and collapse  Dehydration  AKI (acute kidney injury) ([place])  Injury of head, initial encounter  Laceration of right eyebrow, initial encounter    Rx / DC Orders ED Discharge Orders    None       Montine Circle, PA-C 08/30/20 0024    Drenda Freeze, MD 08/31/20 1719

## 2020-08-29 NOTE — ED Notes (Signed)
Patient transported to CT 

## 2020-08-29 NOTE — Progress Notes (Signed)
Orthopedic Tech Progress Note Patient Details:  Mario Proctor 12-14-1940 [id number]  Patient ID: Waldon Merl, male   DOB: [birth year], 80 y.o.   MRN: [id number]   Kennis Carina 08/29/2020, 10:23 PM Trauma.

## 2020-08-29 NOTE — ED Triage Notes (Signed)
Pt bib [facility] as level 2 for fall. Pt was walking in house and got dizzy, standing fall hitting head. Lac above R eye. Initial BP per fire dept was 66/40. GCS 15, 18g LFA. Pt had surgery on L eye for cataracts today. Pt on xarelto for previous MI.

## 2020-08-30 ENCOUNTER — Other Ambulatory Visit: Payer: Self-pay

## 2020-08-30 ENCOUNTER — Encounter (HOSPITAL_COMMUNITY): Payer: Self-pay | Admitting: Family Medicine

## 2020-08-30 DIAGNOSIS — Z79899 Other long term (current) drug therapy: Secondary | ICD-10-CM | POA: Diagnosis not present

## 2020-08-30 DIAGNOSIS — I130 Hypertensive heart and chronic kidney disease with heart failure and stage 1 through stage 4 chronic kidney disease, or unspecified chronic kidney disease: Secondary | ICD-10-CM | POA: Diagnosis not present

## 2020-08-30 DIAGNOSIS — N189 Chronic kidney disease, unspecified: Secondary | ICD-10-CM

## 2020-08-30 DIAGNOSIS — Z20822 Contact with and (suspected) exposure to covid-19: Secondary | ICD-10-CM | POA: Diagnosis not present

## 2020-08-30 DIAGNOSIS — I480 Paroxysmal atrial fibrillation: Secondary | ICD-10-CM

## 2020-08-30 DIAGNOSIS — I509 Heart failure, unspecified: Secondary | ICD-10-CM

## 2020-08-30 DIAGNOSIS — R040 Epistaxis: Secondary | ICD-10-CM | POA: Diagnosis not present

## 2020-08-30 DIAGNOSIS — W1830XA Fall on same level, unspecified, initial encounter: Secondary | ICD-10-CM | POA: Diagnosis not present

## 2020-08-30 DIAGNOSIS — I2510 Atherosclerotic heart disease of native coronary artery without angina pectoris: Secondary | ICD-10-CM | POA: Diagnosis present

## 2020-08-30 DIAGNOSIS — I252 Old myocardial infarction: Secondary | ICD-10-CM | POA: Diagnosis not present

## 2020-08-30 DIAGNOSIS — Y92009 Unspecified place in unspecified non-institutional (private) residence as the place of occurrence of the external cause: Secondary | ICD-10-CM | POA: Diagnosis not present

## 2020-08-30 DIAGNOSIS — Z7901 Long term (current) use of anticoagulants: Secondary | ICD-10-CM | POA: Diagnosis not present

## 2020-08-30 DIAGNOSIS — R55 Syncope and collapse: Secondary | ICD-10-CM

## 2020-08-30 DIAGNOSIS — S0191XA Laceration without foreign body of unspecified part of head, initial encounter: Secondary | ICD-10-CM

## 2020-08-30 DIAGNOSIS — N179 Acute kidney failure, unspecified: Secondary | ICD-10-CM

## 2020-08-30 DIAGNOSIS — E860 Dehydration: Secondary | ICD-10-CM | POA: Diagnosis not present

## 2020-08-30 DIAGNOSIS — Z23 Encounter for immunization: Secondary | ICD-10-CM | POA: Diagnosis not present

## 2020-08-30 DIAGNOSIS — S01111A Laceration without foreign body of right eyelid and periocular area, initial encounter: Secondary | ICD-10-CM | POA: Diagnosis not present

## 2020-08-30 DIAGNOSIS — J449 Chronic obstructive pulmonary disease, unspecified: Secondary | ICD-10-CM | POA: Diagnosis present

## 2020-08-30 DIAGNOSIS — F17210 Nicotine dependence, cigarettes, uncomplicated: Secondary | ICD-10-CM | POA: Diagnosis present

## 2020-08-30 LAB — URINALYSIS, ROUTINE W REFLEX MICROSCOPIC
Bilirubin Urine: NEGATIVE
Glucose, UA: NEGATIVE mg/dL
Hgb urine dipstick: NEGATIVE
Ketones, ur: NEGATIVE mg/dL
Leukocytes,Ua: NEGATIVE
Nitrite: NEGATIVE
Protein, ur: NEGATIVE mg/dL
Specific Gravity, Urine: 1.006 (ref 1.005–1.030)
pH: 7 (ref 5.0–8.0)

## 2020-08-30 LAB — BASIC METABOLIC PANEL
Anion gap: 10 (ref 5–15)
BUN/Creatinine Ratio: 18 (ref 10–24)
BUN: 42 mg/dL — ABNORMAL HIGH (ref 8–27)
BUN: 43 mg/dL — ABNORMAL HIGH (ref 8–23)
CO2: 26 mmol/L (ref 20–29)
CO2: 29 mmol/L (ref 22–32)
Calcium: 9.2 mg/dL (ref 8.6–10.2)
Calcium: 8.7 mg/dL — ABNORMAL LOW (ref 8.9–10.3)
Chloride: 94 mmol/L — ABNORMAL LOW (ref 96–106)
Chloride: 97 mmol/L — ABNORMAL LOW (ref 98–111)
Creatinine, Ser: 2.34 mg/dL — ABNORMAL HIGH (ref 0.76–1.27)
Creatinine, Ser: 2.08 mg/dL — ABNORMAL HIGH (ref 0.61–1.24)
GFR, Estimated: 32 mL/min — ABNORMAL LOW (ref >60)
Glucose, Bld: 114 mg/dL — ABNORMAL HIGH (ref 70–99)
Glucose: 81 mg/dL (ref 65–99)
Potassium: 4.8 mmol/L (ref 3.5–5.2)
Potassium: 4.4 mmol/L (ref 3.5–5.1)
Sodium: 137 mmol/L (ref 134–144)
Sodium: 136 mmol/L (ref 135–145)
eGFR: 28 mL/min/{1.73_m2} — ABNORMAL LOW (ref >59)

## 2020-08-30 LAB — CBC
HCT: 43.6 % (ref 39.0–52.0)
Hemoglobin: 14.5 g/dL (ref 13.0–17.0)
MCH: 32.1 pg (ref 26.0–34.0)
MCHC: 33.3 g/dL (ref 30.0–36.0)
MCV: 96.5 fL (ref 80.0–100.0)
Platelets: 231 10*3/uL (ref 150–400)
RBC: 4.52 MIL/uL (ref 4.22–5.81)
RDW: 12.8 % (ref 11.5–15.5)
WBC: 9.9 10*3/uL (ref 4.0–10.5)
nRBC: 0 % (ref 0.0–0.2)

## 2020-08-30 LAB — TROPONIN I (HIGH SENSITIVITY)
Troponin I (High Sensitivity): 10 ng/L (ref <18)
Troponin I (High Sensitivity): 11 ng/L (ref <18)
Troponin I (High Sensitivity): 12 ng/L (ref <18)

## 2020-08-30 LAB — SARS CORONAVIRUS 2 (TAT 6-24 HRS): SARS Coronavirus 2: NEGATIVE

## 2020-08-30 LAB — PRO B NATRIURETIC PEPTIDE: NT-Pro BNP: 1322 pg/mL — ABNORMAL HIGH (ref 0–486)

## 2020-08-30 MED ORDER — RIVAROXABAN 20 MG PO TABS: 20.0000 mg | ORAL_TABLET | Freq: Every evening | ORAL | Status: DC

## 2020-08-30 MED ORDER — FLUTICASONE-UMECLIDIN-VILANT 100-62.5-25 MCG/INH IN AEPB: 1.0000 | INHALATION_SPRAY | Freq: Every day | RESPIRATORY_TRACT | Status: DC

## 2020-08-30 MED ORDER — ACETAMINOPHEN 650 MG RE SUPP: 650.0000 mg | Freq: Four times a day (QID) | RECTAL | Status: DC | PRN

## 2020-08-30 MED ORDER — SENNOSIDES-DOCUSATE SODIUM 8.6-50 MG PO TABS: 1.0000 | ORAL_TABLET | Freq: Every evening | ORAL | Status: DC | PRN

## 2020-08-30 MED ORDER — UMECLIDINIUM BROMIDE 62.5 MCG/INH IN AEPB
1.0000 | INHALATION_SPRAY | Freq: Every day | RESPIRATORY_TRACT | Status: DC
  Administered 2020-08-30 – 2020-08-31 (×2): 1 via RESPIRATORY_TRACT
  Filled 2020-08-30: qty 7

## 2020-08-30 MED ORDER — FLUTICASONE FUROATE-VILANTEROL 100-25 MCG/INH IN AEPB
1.0000 | INHALATION_SPRAY | Freq: Every day | RESPIRATORY_TRACT | Status: DC
  Administered 2020-08-30 – 2020-08-31 (×2): 1 via RESPIRATORY_TRACT
  Filled 2020-08-30: qty 28

## 2020-08-30 MED ORDER — SODIUM CHLORIDE 0.9% FLUSH
3.0000 mL | Freq: Two times a day (BID) | INTRAVENOUS | Status: DC
  Administered 2020-08-30 – 2020-08-31 (×2): 3 mL via INTRAVENOUS

## 2020-08-30 MED ORDER — METOPROLOL SUCCINATE ER 50 MG PO TB24
50.0000 mg | ORAL_TABLET | Freq: Every day | ORAL | Status: DC
  Administered 2020-08-30: 50 mg via ORAL
  Filled 2020-08-30 (×2): qty 1

## 2020-08-30 MED ORDER — LACTATED RINGERS IV SOLN: INTRAVENOUS | Status: DC

## 2020-08-30 MED ORDER — RIVAROXABAN 15 MG PO TABS
15.0000 mg | ORAL_TABLET | Freq: Every evening | ORAL | Status: DC
  Administered 2020-08-30: 15 mg via ORAL
  Filled 2020-08-30: qty 1

## 2020-08-30 MED ORDER — ALBUTEROL SULFATE HFA 108 (90 BASE) MCG/ACT IN AERS
2.0000 | INHALATION_SPRAY | RESPIRATORY_TRACT | Status: DC | PRN
  Filled 2020-08-30: qty 6.7

## 2020-08-30 MED ORDER — ACETAMINOPHEN 325 MG PO TABS
650.0000 mg | ORAL_TABLET | Freq: Four times a day (QID) | ORAL | Status: DC | PRN
  Administered 2020-08-30 – 2020-08-31 (×2): 650 mg via ORAL
  Filled 2020-08-30 (×2): qty 2

## 2020-08-30 MED ORDER — AMIODARONE HCL 200 MG PO TABS
200.0000 mg | ORAL_TABLET | Freq: Two times a day (BID) | ORAL | Status: DC
  Administered 2020-08-30 – 2020-08-31 (×3): 200 mg via ORAL
  Filled 2020-08-30 (×3): qty 1

## 2020-08-30 MED ORDER — SACUBITRIL-VALSARTAN 97-103 MG PO TABS
1.0000 | ORAL_TABLET | Freq: Every day | ORAL | Status: DC
  Administered 2020-08-30 – 2020-08-31 (×2): 1 via ORAL
  Filled 2020-08-30 (×2): qty 1

## 2020-08-30 MED ORDER — ROSUVASTATIN CALCIUM 5 MG PO TABS
5.0000 mg | ORAL_TABLET | Freq: Every day | ORAL | Status: DC
  Administered 2020-08-30 – 2020-08-31 (×2): 5 mg via ORAL
  Filled 2020-08-30 (×2): qty 1

## 2020-08-30 NOTE — Progress Notes (Signed)
PROGRESS NOTE    EZELL VOGELPOHL  [id number] DOB: 04/13/1941 DOA: 08/29/2020 PCP: Pcp, No    Brief Narrative:  80 y.o. male with medical history significant for CHF, HTN, paroxysmal atrial fibrillation, CAD who presents by EMS after having syncopal episode at home.  He reports that he was laying on the couch and nap and when he woke up he got up to go to the bathroom.  As he was walking on the hall he became very dizzy and passed out in his head on the tile floor.  He sustained a cut over his right eye and had mild nosebleed.  He initially complained of a headache and neck pain which is now resolved.  He is anticoagulated on Xarelto due to paroxysmal atrial fibrillation.  He denies having any slurred speech, drooping face, numbness or weakness of extremities prior to the event.  EMS was called and he was found to be hypotensive with systolic blood pressure in the 60s when they initially evaluated him.  He has been given IV fluids and blood pressure is now stabilized.  He denies any focal neurological complaints and states he feels much better.  He states when he sits up he does not feel dizzy at this time.  He has not had any change in his hearing.  He reports that 2 weeks ago he had gained some weight which was not normal for him so his physician increased his Lasix dose.  He reports that last week he was told to take 10 mg of Lasix in the morning and 10 mg at night which was down from the 20 mg he was taking before.  Denies any swelling of his legs, palpitations, weight gain, shortness of breath.  He has not had any recent fever or illness.  Assessment & Plan:   Principal Problem:   Syncope Active Problems:   Acute kidney injury superimposed on CKD (HCC)   Chronic congestive heart failure (HCC)   Laceration of head   COPD (chronic obstructive pulmonary disease) (HCC)   Paroxysmal atrial fibrillation (HCC)  Active Problems: Acute kidney injury superimposed on CKD secondary to  dehydartion Patient with elevated creatinine of 2.42 above his baseline is normally around 1.3 per old records at the ER physician had obtained.  Family and pt admits to poor po fluid intake leading up to admit Gentle IV fluid hydration overnight with LR at 50 ml/hr with improvement in renal function Cr remains over 2 and membranes remain dry Continue IVF as tolerated Repeat bmet in AM    Chronic congestive heart failure  Pt had been continued on Entresto and metoprolol.  No pulmonary edema on CXR and no edema of legs.  Recheck bmet in AM    COPD (chronic obstructive pulmonary disease) Continue Trielogy and albuterol. Incentive spirometer every 2 hours while awake.     Paroxysmal atrial fibrillation  Continue amiodarone and metoprolol as tolerated    Laceration of head Laceration repaired by ER provider.    DVT prophylaxis: Xarelto Code Status: Full Family Communication: Pt in room, family is at bedside  Status is: Inpatient  Remains inpatient appropriate because:IV treatments appropriate due to intensity of illness or inability to take PO   Dispo: The patient is from: Home              Anticipated d/c is to: Home              Patient currently is not medically stable to d/c.   Difficult to  place patient No   Consultants:     Procedures:     Antimicrobials: Anti-infectives (From admission, onward)   None       Subjective: Reports feeling better today. Admits to not drinking as much as he should   Objective: Vitals:   08/30/20 1600 08/30/20 1602 08/30/20 1603 08/30/20 1607  BP: (!) 146/81     Pulse: (!) 57 (!) 58 (!) 57 (!) 56  Resp: 18 (!) '21 17 13  '$ Temp: 97.8 F (36.6 C)     TempSrc: Oral     SpO2: 97% 97% 97% 95%  Weight:      Height:        Intake/Output Summary (Last 24 hours) at 08/30/2020 1652 Last data filed at 08/30/2020 0553 Gross per 24 hour  Intake 284.16 ml  Output 550 ml  Net -265.84 ml   Filed Weights   08/29/20 2228  08/30/20 0222  Weight: 69.4 kg 69.6 kg    Examination: General exam: Awake, laying in bed, in nad, dry mucus membranes Respiratory system: Normal respiratory effort, no wheezing Cardiovascular system: regular rate, s1, s2 Gastrointestinal system: Soft, nondistended, positive BS Central nervous system: CN2-12 grossly intact, strength intact Extremities: Perfused, no clubbing Skin: Normal skin turgor, no notable skin lesions seen Psychiatry: Mood normal // no visual hallucinations   Data Reviewed: I have personally reviewed following labs and imaging studies  CBC: Recent Labs  Lab 08/29/20 2225 08/30/20 0243  WBC 8.5 9.9  NEUTROABS 5.4  --   HGB 15.1 14.5  HCT 45.8 43.6  MCV 97.2 96.5  PLT 232 [id number]   Basic Metabolic Panel: Recent Labs  Lab 08/29/20 2225 08/30/20 0243  NA 135 136  K 5.0 4.4  CL 95* 97*  CO2 30 29  GLUCOSE 95 114*  BUN 45* 43*  CREATININE 2.42* 2.08*  CALCIUM 8.9 8.7*   GFR: Estimated Creatinine Clearance: 28.8 mL/min (A) (by C-G formula based on SCr of 2.08 mg/dL (H)). Liver Function Tests: Recent Labs  Lab 08/29/20 2225  AST 43*  ALT 36  ALKPHOS 91  BILITOT 0.8  PROT 7.0  ALBUMIN 3.6   No results for input(s): LIPASE, AMYLASE in the last 168 hours. No results for input(s): AMMONIA in the last 168 hours. Coagulation Profile: Recent Labs  Lab 08/29/20 2225  INR 2.1*   Cardiac Enzymes: No results for input(s): CKTOTAL, CKMB, CKMBINDEX, TROPONINI in the last 168 hours. BNP (last 3 results) No results for input(s): PROBNP in the last 8760 hours. HbA1C: No results for input(s): HGBA1C in the last 72 hours. CBG: No results for input(s): GLUCAP in the last 168 hours. Lipid Profile: No results for input(s): CHOL, HDL, LDLCALC, TRIG, CHOLHDL, LDLDIRECT in the last 72 hours. Thyroid Function Tests: No results for input(s): TSH, T4TOTAL, FREET4, T3FREE, THYROIDAB in the last 72 hours. Anemia Panel: No results for input(s): VITAMINB12,  FOLATE, FERRITIN, TIBC, IRON, RETICCTPCT in the last 72 hours. Sepsis Labs: No results for input(s): PROCALCITON, LATICACIDVEN in the last 168 hours.  Recent Results (from the past 240 hour(s))  SARS CORONAVIRUS 2 (TAT 6-24 HRS) Nasopharyngeal Nasopharyngeal Swab     Status: None   Collection Time: 08/30/20  4:15 AM   Specimen: Nasopharyngeal Swab  Result Value Ref Range Status   SARS Coronavirus 2 NEGATIVE NEGATIVE Final    Comment: (NOTE) SARS-CoV-2 target nucleic acids are NOT DETECTED.  The SARS-CoV-2 RNA is generally detectable in upper and lower respiratory specimens during the acute phase of infection.  Negative results do not preclude SARS-CoV-2 infection, do not rule out co-infections with other pathogens, and should not be used as the sole basis for treatment or other patient management decisions. Negative results must be combined with clinical observations, patient history, and epidemiological information. The expected result is Negative.  Fact Sheet for Patients: SugarRoll.be  Fact Sheet for Healthcare Providers: https://www.woods-mathews.com/  This test is not yet approved or cleared by the [place] FDA and  has been authorized for detection and/or diagnosis of SARS-CoV-2 by FDA under an Emergency Use Authorization (EUA). This EUA will remain  in effect (meaning this test can be used) for the duration of the COVID-19 declaration under Se ction 564(b)(1) of the Act, 21 U.S.C. section 360bbb-3(b)(1), unless the authorization is terminated or revoked sooner.  Performed at [facility], [place] [address] [place], [place] [address]      Radiology Studies: CT Head Wo Contrast  Result Date: 08/29/2020 CLINICAL DATA:  Syncope and fall EXAM: CT HEAD WITHOUT CONTRAST CT CERVICAL SPINE WITHOUT CONTRAST TECHNIQUE: Multidetector CT imaging of the head and cervical spine was performed following the standard protocol without  intravenous contrast. Multiplanar CT image reconstructions of the cervical spine were also generated. COMPARISON:  None. FINDINGS: CT HEAD FINDINGS Brain: There is no mass, hemorrhage or extra-axial collection. The size and configuration of the ventricles and extra-axial CSF spaces are normal. There is hypoattenuation of the periventricular white matter, most commonly indicating chronic ischemic microangiopathy. Vascular: No abnormal hyperdensity of the major intracranial arteries or dural venous sinuses. No intracranial atherosclerosis. Skull: Small right frontal scalp hematoma.  No skull fracture. Sinuses/Orbits: No fluid levels or advanced mucosal thickening of the visualized paranasal sinuses. No mastoid or middle ear effusion. The orbits are normal. CT CERVICAL SPINE FINDINGS Alignment: No static subluxation. Facets are aligned. Occipital condyles are normally positioned. Skull base and vertebrae: No acute fracture. Soft tissues and spinal canal: No prevertebral fluid or swelling. No visible canal hematoma. Disc levels: Severe disc height loss at C3-5. Upper chest: No pneumothorax, pulmonary nodule or pleural effusion. Other: Normal visualized paraspinal cervical soft tissues. IMPRESSION: 1. Advanced chronic ischemic microangiopathy without acute intracranial abnormality. 2. No acute fracture or static subluxation of the cervical spine. 3. Small right frontal scalp hematoma. Electronically Signed   By: Ulyses Jarred M.D.   On: 08/29/2020 23:05   CT Cervical Spine Wo Contrast  Result Date: 08/29/2020 CLINICAL DATA:  Syncope and fall EXAM: CT HEAD WITHOUT CONTRAST CT CERVICAL SPINE WITHOUT CONTRAST TECHNIQUE: Multidetector CT imaging of the head and cervical spine was performed following the standard protocol without intravenous contrast. Multiplanar CT image reconstructions of the cervical spine were also generated. COMPARISON:  None. FINDINGS: CT HEAD FINDINGS Brain: There is no mass, hemorrhage or  extra-axial collection. The size and configuration of the ventricles and extra-axial CSF spaces are normal. There is hypoattenuation of the periventricular white matter, most commonly indicating chronic ischemic microangiopathy. Vascular: No abnormal hyperdensity of the major intracranial arteries or dural venous sinuses. No intracranial atherosclerosis. Skull: Small right frontal scalp hematoma.  No skull fracture. Sinuses/Orbits: No fluid levels or advanced mucosal thickening of the visualized paranasal sinuses. No mastoid or middle ear effusion. The orbits are normal. CT CERVICAL SPINE FINDINGS Alignment: No static subluxation. Facets are aligned. Occipital condyles are normally positioned. Skull base and vertebrae: No acute fracture. Soft tissues and spinal canal: No prevertebral fluid or swelling. No visible canal hematoma. Disc levels: Severe disc height loss at C3-5. Upper  chest: No pneumothorax, pulmonary nodule or pleural effusion. Other: Normal visualized paraspinal cervical soft tissues. IMPRESSION: 1. Advanced chronic ischemic microangiopathy without acute intracranial abnormality. 2. No acute fracture or static subluxation of the cervical spine. 3. Small right frontal scalp hematoma. Electronically Signed   By: Ulyses Jarred M.D.   On: 08/29/2020 23:05   DG Pelvis Portable  Result Date: 08/29/2020 CLINICAL DATA:  Recent fall EXAM: PORTABLE PELVIS 1 VIEWS COMPARISON:  None. FINDINGS: Pelvic ring is intact. No acute fracture or dislocation is noted. Degenerative changes of lumbar spine are noted. IMPRESSION: No acute abnormality noted. Electronically Signed   By: Inez Catalina M.D.   On: 08/29/2020 22:40   DG Chest Portable 1 View  Result Date: 08/29/2020 CLINICAL DATA:  Recent fall, initial encounter EXAM: PORTABLE CHEST 1 VIEW COMPARISON:  05/14/2020 FINDINGS: Cardiac shadow is within normal limits. Aortic calcifications are seen. The lungs are well aerated bilaterally. No bony abnormality is  seen. IMPRESSION: No active disease. Electronically Signed   By: Inez Catalina M.D.   On: 08/29/2020 22:41   DG Hand Complete Right  Result Date: 08/29/2020 CLINICAL DATA:  Recent fall with right hand pain, initial encounter EXAM: RIGHT HAND - COMPLETE 3+ VIEW COMPARISON:  None. FINDINGS: Widening of the scapholunate space is noted likely related to chronic ligamentous injury. Bone cyst is noted in the distal aspect radius without complicating factors. Degenerative changes of the distal radioulnar joint are seen. No acute fracture or dislocation is noted. No soft tissue abnormality is noted. IMPRESSION: Chronic appearing changes without acute abnormality. Electronically Signed   By: Inez Catalina M.D.   On: 08/29/2020 22:39    Scheduled Meds: . amiodarone  200 mg Oral BID  . fluticasone furoate-vilanterol  1 puff Inhalation Daily  . metoprolol succinate  50 mg Oral Daily  . rivaroxaban  15 mg Oral QPM  . rosuvastatin  5 mg Oral Daily  . sacubitril-valsartan  1 tablet Oral Daily  . sodium chloride flush  3 mL Intravenous Q12H  . umeclidinium bromide  1 puff Inhalation Daily   Continuous Infusions: . lactated ringers 50 mL/hr at 08/30/20 0553     LOS: 0 days   Marylu Lund, MD [facility] Pager On Amion  If 7PM-7AM, please contact night-coverage 08/30/2020, 4:52 PM

## 2020-08-30 NOTE — H&P (Signed)
History and Physical    Mario Proctor [id number] DOB: Jul 05, 1940 DOA: 08/29/2020  PCP: Pcp, No   Patient coming from: Home  Chief Complaint:  Syncope at home.   HPI: Mario Proctor is a 80 y.o. male with medical history significant for CHF, HTN, paroxysmal atrial fibrillation, CAD who presents by EMS after having syncopal episode at home.  He reports that he was laying on the couch and nap and when he woke up he got up to go to the bathroom.  As he was walking on the hall he became very dizzy and passed out in his head on the tile floor.  He sustained a cut over his right eye and had mild nosebleed.  He initially complained of a headache and neck pain which is now resolved.  He is anticoagulated on Xarelto due to paroxysmal atrial fibrillation.  He denies having any slurred speech, drooping face, numbness or weakness of extremities prior to the event.  EMS was called and he was found to be hypotensive with systolic blood pressure in the 60s when they initially evaluated him.  He has been given IV fluids and blood pressure is now stabilized.  He denies any focal neurological complaints and states he feels much better.  He states when he sits up he does not feel dizzy at this time.  He has not had any change in his hearing.  He reports that 2 weeks ago he had gained some weight which was not normal for him so his physician increased his Lasix dose.  He reports that last week he was told to take 10 mg of Lasix in the morning and 10 mg at night which was down from the 20 mg he was taking before.  Denies any swelling of his legs, palpitations, weight gain, shortness of breath.  He has not had any recent fever or illness.  ED Course: In the emergency room laceration over his right eye has been repaired with stitches.  He has had CT of his head and neck which are negative for acute injury.  Chest x-ray is negative.  Found to have an elevated creatinine level over his baseline.  Tonight creatinine is 2.42  with a baseline of around 1.3 per report from the ER PA.  Patient is hemodynamically stable at this time.  Review of Systems:  General: Denies fever, chills, weight loss, night sweats. Denies change in appetite HENT: Denies head trauma, headache, denies change in hearing, tinnitus.  Denies nasal congestion or bleeding.  Denies sore throat, sores in mouth.  Denies difficulty swallowing Eyes: Denies blurry vision, pain in eye, drainage.  Denies discoloration of eyes. Neck: Denies pain.  Denies swelling.  Denies pain with movement. Cardiovascular: Denies chest pain, palpitations.  Denies edema.  Denies orthopnea Respiratory: Denies shortness of breath, cough.  Denies wheezing.  Denies sputum production Gastrointestinal: Denies abdominal pain, swelling.  Denies nausea, vomiting, diarrhea.  Denies melena.  Denies hematemesis. Musculoskeletal: Denies limitation of movement.  Denies deformity or swelling.  Denies pain.  Genitourinary: Denies pelvic pain.  Denies urinary frequency or hesitancy.  Denies dysuria.  Skin: Denies rash.  Denies petechiae, purpura, ecchymosis. Neurological:  Denies seizure activity.  Denies paresthesia.  Denies slurred speech, drooping face.  Denies visual change. Psychiatric: Denies depression, anxiety. Denies hallucinations.  Past Medical History:  Diagnosis Date  . Myocardial infarct ([place]) 2019  . Small bowel obstruction ([place])     History reviewed. No pertinent surgical history.  Social History  reports that  he has been smoking cigarettes. He has a 25.00 pack-year smoking history. He has never used smokeless tobacco. He reports current alcohol use of about 6.0 standard drinks of alcohol per week. No history on file for drug use.  No Known Allergies  History reviewed. No pertinent family history.   Prior to Admission medications   Medication Sig Start Date End Date Taking? Authorizing Provider  albuterol (VENTOLIN HFA) 108 (90 Base) MCG/ACT inhaler Inhale 2  puffs into the lungs every 6 (six) hours as needed. 08/02/20   [provider]  amiodarone (PACERONE) 200 MG tablet Take 200 mg by mouth 2 (two) times daily. 08/25/20   [provider]  ENTRESTO 97-103 MG Take 1 tablet by mouth daily. 08/22/20   [provider]  furosemide (LASIX) 20 MG tablet Take 20 mg by mouth 2 (two) times daily as needed. 08/18/20   [provider]  metoprolol succinate (TOPROL-XL) 50 MG 24 hr tablet Take 50 mg by mouth daily. 07/04/20   [provider]  rosuvastatin (CRESTOR) 5 MG tablet Take 5 mg by mouth daily. 08/26/20   [provider]  spironolactone (ALDACTONE) 25 MG tablet Take 0.5 tablets by mouth daily. 07/28/20   [provider]  TRELEGY ELLIPTA 100-62.5-25 MCG/INH AEPB Inhale 1 puff into the lungs daily. 08/08/20   [provider]  triamcinolone cream (KENALOG) 0.1 % Apply 1 application topically 2 (two) times daily. 08/07/20   [provider]  XARELTO 20 MG TABS tablet Take 20 mg by mouth every evening. 07/04/20   [provider]    Physical Exam: Vitals:   08/29/20 2315 08/29/20 2330 08/29/20 2345 08/30/20 0015  BP: 138/72 (!) 147/78 132/66 133/69  Pulse: 62 64 60 63  Resp: '20 18 18 14  '$ Temp:      TempSrc:      SpO2: 95% 94% 94% 94%  Weight:      Height:        Constitutional: NAD, calm, comfortable Vitals:   08/29/20 2315 08/29/20 2330 08/29/20 2345 08/30/20 0015  BP: 138/72 (!) 147/78 132/66 133/69  Pulse: 62 64 60 63  Resp: '20 18 18 14  '$ Temp:      TempSrc:      SpO2: 95% 94% 94% 94%  Weight:      Height:       General: WDWN, Alert and oriented x3.  Eyes: EOMI, PERRL, conjunctivae normal.  Sclera nonicteric HENT:  [place]/AT, external ears normal.  Nares patent with mild epistasis right nares.  Mucous membranes are moist.   Neck: Soft, normal range of motion, supple, no masses, Trachea midline Respiratory: Equal breath sounds with mild diffuse rales bilaterally, no  wheezing, no crackles. Normal respiratory effort. No accessory muscle use.  Cardiovascular: Regular rate and rhythm, no murmurs / rubs / gallops. No extremity edema. 2+ pedal pulses. No carotid bruits.  Abdomen: Soft, no tenderness, nondistended, no rebound or guarding.  No masses palpated. Bowel sounds normoactive Musculoskeletal: FROM. No cyanosis. No joint deformity upper and lower extremities.  Normal muscle tone.  Skin: Warm, dry, intact no rashes, ulcers. No induration. 3 cm laceration right eyebrow repaired with stitches. Dressing in place Neurologic: CN 2-12 grossly intact.  Normal speech.  Sensation intact, patella DTR +1 bilaterally. Strength 5/5 in all extremities.   Psychiatric: Normal judgment and insight.  Normal mood.    Labs on Admission: I have personally reviewed following labs and imaging studies  CBC: Recent Labs  Lab 08/29/20 2225  WBC 8.5  NEUTROABS 5.4  HGB 15.1  HCT 45.8  MCV 97.2  PLT [id number]    Basic Metabolic Panel: Recent Labs  Lab 08/29/20 2225  NA 135  K 5.0  CL 95*  CO2 30  GLUCOSE 95  BUN 45*  CREATININE 2.42*  CALCIUM 8.9    GFR: Estimated Creatinine Clearance: 24.7 mL/min (A) (by C-G formula based on SCr of 2.42 mg/dL (H)).  Liver Function Tests: Recent Labs  Lab 08/29/20 2225  AST 43*  ALT 36  ALKPHOS 91  BILITOT 0.8  PROT 7.0  ALBUMIN 3.6    Urine analysis: No results found for: COLORURINE, APPEARANCEUR, LABSPEC, PHURINE, GLUCOSEU, HGBUR, BILIRUBINUR, KETONESUR, PROTEINUR, UROBILINOGEN, NITRITE, LEUKOCYTESUR  Radiological Exams on Admission: CT Head Wo Contrast  Result Date: 08/29/2020 CLINICAL DATA:  Syncope and fall EXAM: CT HEAD WITHOUT CONTRAST CT CERVICAL SPINE WITHOUT CONTRAST TECHNIQUE: Multidetector CT imaging of the head and cervical spine was performed following the standard protocol without intravenous contrast. Multiplanar CT image reconstructions of the cervical spine were also generated. COMPARISON:  None.  FINDINGS: CT HEAD FINDINGS Brain: There is no mass, hemorrhage or extra-axial collection. The size and configuration of the ventricles and extra-axial CSF spaces are normal. There is hypoattenuation of the periventricular white matter, most commonly indicating chronic ischemic microangiopathy. Vascular: No abnormal hyperdensity of the major intracranial arteries or dural venous sinuses. No intracranial atherosclerosis. Skull: Small right frontal scalp hematoma.  No skull fracture. Sinuses/Orbits: No fluid levels or advanced mucosal thickening of the visualized paranasal sinuses. No mastoid or middle ear effusion. The orbits are normal. CT CERVICAL SPINE FINDINGS Alignment: No static subluxation. Facets are aligned. Occipital condyles are normally positioned. Skull base and vertebrae: No acute fracture. Soft tissues and spinal canal: No prevertebral fluid or swelling. No visible canal hematoma. Disc levels: Severe disc height loss at C3-5. Upper chest: No pneumothorax, pulmonary nodule or pleural effusion. Other: Normal visualized paraspinal cervical soft tissues. IMPRESSION: 1. Advanced chronic ischemic microangiopathy without acute intracranial abnormality. 2. No acute fracture or static subluxation of the cervical spine. 3. Small right frontal scalp hematoma. Electronically Signed   By: Ulyses Jarred M.D.   On: 08/29/2020 23:05   CT Cervical Spine Wo Contrast  Result Date: 08/29/2020 CLINICAL DATA:  Syncope and fall EXAM: CT HEAD WITHOUT CONTRAST CT CERVICAL SPINE WITHOUT CONTRAST TECHNIQUE: Multidetector CT imaging of the head and cervical spine was performed following the standard protocol without intravenous contrast. Multiplanar CT image reconstructions of the cervical spine were also generated. COMPARISON:  None. FINDINGS: CT HEAD FINDINGS Brain: There is no mass, hemorrhage or extra-axial collection. The size and configuration of the ventricles and extra-axial CSF spaces are normal. There is  hypoattenuation of the periventricular white matter, most commonly indicating chronic ischemic microangiopathy. Vascular: No abnormal hyperdensity of the major intracranial arteries or dural venous sinuses. No intracranial atherosclerosis. Skull: Small right frontal scalp hematoma.  No skull fracture. Sinuses/Orbits: No fluid levels or advanced mucosal thickening of the visualized paranasal sinuses. No mastoid or middle ear effusion. The orbits are normal. CT CERVICAL SPINE FINDINGS Alignment: No static subluxation. Facets are aligned. Occipital condyles are normally positioned. Skull base and vertebrae: No acute fracture. Soft tissues and spinal canal: No prevertebral fluid or swelling. No visible canal hematoma. Disc levels: Severe disc height loss at C3-5. Upper chest: No pneumothorax, pulmonary nodule or pleural effusion. Other: Normal visualized paraspinal cervical soft tissues. IMPRESSION: 1. Advanced chronic ischemic microangiopathy without acute intracranial abnormality. 2. No acute  fracture or static subluxation of the cervical spine. 3. Small right frontal scalp hematoma. Electronically Signed   By: Ulyses Jarred M.D.   On: 08/29/2020 23:05   DG Pelvis Portable  Result Date: 08/29/2020 CLINICAL DATA:  Recent fall EXAM: PORTABLE PELVIS 1 VIEWS COMPARISON:  None. FINDINGS: Pelvic ring is intact. No acute fracture or dislocation is noted. Degenerative changes of lumbar spine are noted. IMPRESSION: No acute abnormality noted. Electronically Signed   By: Inez Catalina M.D.   On: 08/29/2020 22:40   DG Chest Portable 1 View  Result Date: 08/29/2020 CLINICAL DATA:  Recent fall, initial encounter EXAM: PORTABLE CHEST 1 VIEW COMPARISON:  05/14/2020 FINDINGS: Cardiac shadow is within normal limits. Aortic calcifications are seen. The lungs are well aerated bilaterally. No bony abnormality is seen. IMPRESSION: No active disease. Electronically Signed   By: Inez Catalina M.D.   On: 08/29/2020 22:41   DG Hand  Complete Right  Result Date: 08/29/2020 CLINICAL DATA:  Recent fall with right hand pain, initial encounter EXAM: RIGHT HAND - COMPLETE 3+ VIEW COMPARISON:  None. FINDINGS: Widening of the scapholunate space is noted likely related to chronic ligamentous injury. Bone cyst is noted in the distal aspect radius without complicating factors. Degenerative changes of the distal radioulnar joint are seen. No acute fracture or dislocation is noted. No soft tissue abnormality is noted. IMPRESSION: Chronic appearing changes without acute abnormality. Electronically Signed   By: Inez Catalina M.D.   On: 08/29/2020 22:39    EKG: I have ordered EKG and is pending  Assessment/Plan Principal Problem:   Syncope Mario Proctor is admitted to medical telemetry for further monitoring and workup.  Orthostatic blood pressures will be obtained.  EKG is pending and will be reviewed once it is available Check troponin every 3 hours x3.  Patient does have history of CAD and MI in the past.  He states he has not had any chest pain or palpitations. No focal neurological deficits CT scans and chest x-ray reviewed  Active Problems:   Acute kidney injury superimposed on CKD  Patient with elevated creatinine of 2.42 above his baseline is normally around 1.3 per old records at the ER physician had obtained.  Gentle IV fluid hydration overnight with LR at 50 ml/hr Recheck electrolytes renal function the morning    Chronic congestive heart failure  Continue Entresto and metoprolol. Check serial troponin levels No pulmonary edema on CXR and no edema of legs.     COPD (chronic obstructive pulmonary disease) Continue Trielogy and albuterol. Incentive spirometer every 2 hours while awake.     Paroxysmal atrial fibrillation  Continue amiodarone and metoprolol. Monitor.    Laceration of head Laceration repaired by ER provider.     DVT prophylaxis: Is anticoagulated on Xarelto which is continued Code Status:   Full  Code Family Communication:  Diagnosis and plan discussed with patient and family who is at bedside.  Questions answered.  Further recommendations to follow as clinical indicated Disposition Plan:   Patient is from:  Home  Anticipated DC to:  Home  Anticipated DC date:  Anticipate two midnight or more stay in the hospital  Anticipated DC barriers: No barriers to discharge identified at this time   Admission status:  Inpatient   Yevonne Aline Aunica Dauphinee MD [facility]  How to contact the [facility] Attending or Consulting provider [place] or covering provider during after hours [place], for this patient?   1. Check the care team in [facility] and  look for a) attending/consulting [facility] provider listed and b) the [facility] team listed 2. Log into [email or web address] and use [place]'s universal password to access. If you do not have the password, please contact the hospital operator. 3. Locate the [facility] provider you are looking for under [facility] and page to a number that you can be directly reached. 4. If you still have difficulty reaching the provider, please page the [facility] (Director on Call) for the Hospitalists listed on amion for assistance.  08/30/2020, 12:53 AM

## 2020-08-30 NOTE — Progress Notes (Signed)
0220- Patient bought to the unit via stretcher from ED. Upon arrival to the unit, blood oozing out of the laceration above the eyebrow. Band aid removed and gauze applied. Minimum nosebleed seen as well. Vitals taken and patient settled in.   0345 - Lab called this RN notifying that the previous covid swab sample was discarded and need to get another one. This RN swab the patient's right nostril (not bleeding) and took the sample to the lab; awaiting results.

## 2020-08-30 NOTE — Plan of Care (Signed)

## 2020-08-31 DIAGNOSIS — N179 Acute kidney failure, unspecified: Secondary | ICD-10-CM | POA: Diagnosis not present

## 2020-08-31 DIAGNOSIS — Y92009 Unspecified place in unspecified non-institutional (private) residence as the place of occurrence of the external cause: Secondary | ICD-10-CM | POA: Diagnosis not present

## 2020-08-31 DIAGNOSIS — N189 Chronic kidney disease, unspecified: Secondary | ICD-10-CM | POA: Diagnosis not present

## 2020-08-31 DIAGNOSIS — Z23 Encounter for immunization: Secondary | ICD-10-CM | POA: Diagnosis present

## 2020-08-31 DIAGNOSIS — W1830XA Fall on same level, unspecified, initial encounter: Secondary | ICD-10-CM | POA: Diagnosis not present

## 2020-08-31 LAB — COMPREHENSIVE METABOLIC PANEL
ALT: 27 U/L (ref 0–44)
AST: 31 U/L (ref 15–41)
Albumin: 2.9 g/dL — ABNORMAL LOW (ref 3.5–5.0)
Alkaline Phosphatase: 86 U/L (ref 38–126)
Anion gap: 7 (ref 5–15)
BUN: 30 mg/dL — ABNORMAL HIGH (ref 8–23)
CO2: 28 mmol/L (ref 22–32)
Calcium: 8.6 mg/dL — ABNORMAL LOW (ref 8.9–10.3)
Chloride: 98 mmol/L (ref 98–111)
Creatinine, Ser: 1.55 mg/dL — ABNORMAL HIGH (ref 0.61–1.24)
GFR, Estimated: 46 mL/min — ABNORMAL LOW (ref >60)
Glucose, Bld: 99 mg/dL (ref 70–99)
Potassium: 4 mmol/L (ref 3.5–5.1)
Sodium: 133 mmol/L — ABNORMAL LOW (ref 135–145)
Total Bilirubin: 1 mg/dL (ref 0.3–1.2)
Total Protein: 6.2 g/dL — ABNORMAL LOW (ref 6.5–8.1)

## 2020-08-31 LAB — CBC
HCT: 41.8 % (ref 39.0–52.0)
Hemoglobin: 14 g/dL (ref 13.0–17.0)
MCH: 32 pg (ref 26.0–34.0)
MCHC: 33.5 g/dL (ref 30.0–36.0)
MCV: 95.4 fL (ref 80.0–100.0)
Platelets: 212 10*3/uL (ref 150–400)
RBC: 4.38 MIL/uL (ref 4.22–5.81)
RDW: 12.5 % (ref 11.5–15.5)
WBC: 8.7 10*3/uL (ref 4.0–10.5)
nRBC: 0 % (ref 0.0–0.2)

## 2020-08-31 MED ORDER — METOPROLOL SUCCINATE ER 25 MG PO TB24
25.0000 mg | ORAL_TABLET | Freq: Every day | ORAL | Status: DC
  Administered 2020-08-31: 25 mg via ORAL
  Filled 2020-08-31: qty 1

## 2020-08-31 MED ORDER — METOPROLOL SUCCINATE ER 25 MG PO TB24: 25.0000 mg | ORAL_TABLET | Freq: Every day | ORAL | 0 refills | Status: DC

## 2020-08-31 NOTE — Progress Notes (Signed)
Removed PIV access x 1 and patient and wife received discharge instructions. They understood well. Changed dressing on forehead, there is no dressing on Rt. wist area. HS [employer]

## 2020-08-31 NOTE — Evaluation (Addendum)
Physical Therapy Evaluation & Discharge Patient Details Name: Mario Proctor MRN: [id number] DOB: 1940/07/13 Today's Date: 08/31/2020   History of Present Illness  Pt is a 80 y.o. male admitted 08/29/20 with syncopal episode while walking, (+) LOC and hitting head; EMS found pt hypotensive. Workup for AKI on CKD secondary to dehydration. PMH includes CKD, CHF, COPD, PAF on Xarelto.    Clinical Impression  Patient evaluated by Physical Therapy with no further acute PT needs identified. PTA, pt independent, lives with supportive wife. Today, pt moving well with supervision for safety/lines; no overt instability or LOB noted, pt asymptomatic. Dynamic Gait Index score of 20/24 does not indicate increased risk for falls with higher level balance activities. All education has been completed and the patient has no further questions. Acute PT is signing off. Thank you for this referral.  Orthostatic BPs Supine 158/77, HR 56  Sitting 136/89, HR 59  Standing 137/78, HR 61  Standing after 2 min 131/83, HR 60  Post-ambulation 142/97, HR 60   SpO2 92-97% on RA with minimal DOE noted     Follow Up Recommendations No PT follow up;Supervision - Intermittent    Equipment Recommendations  None recommended by PT    Recommendations for Other Services       Precautions / Restrictions Precautions Precautions: Fall Restrictions Weight Bearing Restrictions: No      Mobility  Bed Mobility Overal bed mobility: Independent                  Transfers Overall transfer level: Independent Equipment used: None                Ambulation/Gait Ambulation/Gait assistance: Supervision Gait Distance (Feet): 250 Feet Assistive device: None Gait Pattern/deviations: Step-through pattern;Decreased stride length Gait velocity: Decreased   General Gait Details: Steady gait without DME, no overt LOB and instability noted; supervision for safety/lines  Stairs            Wheelchair  Mobility    Modified Rankin (Stroke Patients Only)       Balance Overall balance assessment: Needs assistance   Sitting balance-Leahy Scale: Good       Standing balance-Leahy Scale: Good                   Standardized Balance Assessment Standardized Balance Assessment : Dynamic Gait Index   Dynamic Gait Index Level Surface: Normal Change in Gait Speed: Normal Gait with Horizontal Head Turns: Mild Impairment Gait with Vertical Head Turns: Mild Impairment Gait and Pivot Turn: Normal Step Over Obstacle: Mild Impairment Step Around Obstacles: Normal Steps: Mild Impairment Total Score: 20       Pertinent Vitals/Pain Pain Assessment: No/denies pain    Home Living Family/patient expects to be discharged to:: Private residence Living Arrangements: Spouse/significant other Available Help at Discharge: Family;Available PRN/intermittently Type of Home: House Home Access: Stairs to enter Entrance Stairs-Rails: Right Entrance Stairs-Number of Steps: 12 (From basement/garage entrance to main floor; pt also uses bathroom in basement) Home Layout: Two level Home Equipment: Cane - single point Additional Comments: Lives with supportive wife who is gone at times being caregiver for her mom    Prior Function Level of Independence: Independent         Comments: Independent. Less active due to health conditions; mows lawn with riding mower     Hand Dominance        Extremity/Trunk Assessment   Upper Extremity Assessment Upper Extremity Assessment: Overall WFL for tasks assessed  Lower Extremity Assessment Lower Extremity Assessment: Overall WFL for tasks assessed       Communication   Communication: No difficulties  Cognition Arousal/Alertness: Awake/alert Behavior During Therapy: WFL for tasks assessed/performed Overall Cognitive Status: Within Functional Limits for tasks assessed                                 General Comments: WFL  for simple tasks, not formally assessed      General Comments      Exercises     Assessment/Plan    PT Assessment Patent does not need any further PT services  PT Problem List         PT Treatment Interventions      PT Goals (Current goals can be found in the Care Plan section)  Acute Rehab PT Goals Patient Stated Goal: Home today PT Goal Formulation: All assessment and education complete, DC therapy    Frequency     Barriers to discharge        Co-evaluation               AM-PAC PT "6 Clicks" Mobility  Outcome Measure Help needed turning from your back to your side while in a flat bed without using bedrails?: None Help needed moving from lying on your back to sitting on the side of a flat bed without using bedrails?: None Help needed moving to and from a bed to a chair (including a wheelchair)?: None Help needed standing up from a chair using your arms (e.g., wheelchair or bedside chair)?: None Help needed to walk in hospital room?: None Help needed climbing 3-5 steps with a railing? : A Little 6 Click Score: 23    End of Session Equipment Utilized During Treatment: Gait belt Activity Tolerance: Patient tolerated treatment well Patient left: in bed;with call bell/phone within reach;with family/visitor present Nurse Communication: Mobility status PT Visit Diagnosis: Other abnormalities of gait and mobility (R26.89)    Time: [id number] PT Time Calculation (min) (ACUTE ONLY): 22 min   Charges:   PT Evaluation $PT Eval Moderate Complexity: [place], PT, DPT Acute Rehabilitation Services  Pager [phone] Office [place] 08/31/2020, 2:12 PM

## 2020-08-31 NOTE — Discharge Summary (Signed)
Physician Discharge Summary  Mario Proctor [id number] DOB: [birth year] DOA: 08/29/2020  PCP: Pcp, No  Admit date: 08/29/2020 Discharge date: 08/31/2020  Admitted From: Home  Disposition:  Home  Recommendations for Outpatient Follow-up:  1. Follow up with PCP in 1-2 weeks    Discharge Condition:Improved CODE STATUS:Full Diet recommendation: Heart healthy   Brief/Interim Summary: 80 y.o.malewith medical history significant forCHF, HTN,paroxysmal atrial fibrillation, CAD who presents by EMS after having syncopal episode at home. He reports that he was laying on the couch and nap and when he woke up he got up to go to the bathroom. As he was walking on the hall he became very dizzy and passed out in his head on the tile floor. He sustained a cut over his right eye and had mild nosebleed. He initially complained of a headache and neck pain which is now resolved. He is anticoagulated on Xarelto due to paroxysmal atrial fibrillation. He denies having any slurred speech, drooping face, numbness or weakness of extremities prior to the event. EMS was called and he was found to be hypotensive with systolic blood pressure in the 60s when they initially evaluated him. He has been given IV fluids and blood pressure is now stabilized. He denies any focal neurological complaints and states he feels much better. He states when he sits up he does not feel dizzy at this time. He has not had any change in his hearing. He reports that 2 weeks ago he had gained some weight which was not normal for him so his physician increased his Lasix dose. He reports that last week he was told to take 10 mg of Lasix in the morning and 10 mg at night which was down from the 20 mg he was taking before.Denies any swelling of his legs, palpitations, weight gain, shortness of breath.He has not had any recent fever or illness.   Discharge Diagnoses:  Principal Problem:   Syncope Active Problems:   Acute  kidney injury superimposed on CKD (HCC)   Chronic congestive heart failure (HCC)   Laceration of head   COPD (chronic obstructive pulmonary disease) (HCC)   Paroxysmal atrial fibrillation (HCC)  Active Problems: Acute kidney injury superimposed on CKD secondary to dehydartion Patient with elevated creatinine of 2.42 above his baseline is normally around 1.3 per old records at the ER physician had obtained.  Family and pt admits to poor po fluid intake leading up to admit Gentle IV fluid hydration with improvement in renal function  Chronic congestive heart failure, unknown if systolic or diastolic Pt had been continued on Entresto and metoprolol.  No pulmonary edema on CXR and no edema of legs.  COPD (chronic obstructive pulmonary disease)  Continued Trielogy and albuterol. Incentive spirometer every 2 hours while awake.  Paroxysmal atrial fibrillation  Continue amiodarone and metoprolol as tolerated  Laceration of head Laceration repaired by ER provider.   Discharge Instructions   Allergies as of 08/31/2020   No Known Allergies     Medication List    TAKE these medications   albuterol 108 (90 Base) MCG/ACT inhaler Commonly known as: VENTOLIN HFA Inhale 2 puffs into the lungs every 6 (six) hours as needed.   amiodarone 200 MG tablet Commonly known as: PACERONE Take 200 mg by mouth 2 (two) times daily.   diphenhydrAMINE 25 MG tablet Commonly known as: BENADRYL Take 25 mg by mouth every 6 (six) hours as needed.   Entresto 97-103 MG Generic drug: sacubitril-valsartan Take 1 tablet by mouth at  bedtime.   furosemide 20 MG tablet Commonly known as: LASIX Take 10 mg by mouth 2 (two) times daily as needed.   metoprolol succinate 25 MG 24 hr tablet Commonly known as: TOPROL-XL Take 1 tablet (25 mg total) by mouth daily. What changed:   medication strength  how much to take  when to take this   rosuvastatin 5 MG tablet Commonly known as:  CRESTOR Take 5 mg by mouth every morning.   Trelegy Ellipta 100-62.5-25 MCG/INH Aepb Generic drug: Fluticasone-Umeclidin-Vilant Inhale 1 puff into the lungs daily.   triamcinolone cream 0.1 % Commonly known as: KENALOG Apply 1 application topically 2 (two) times daily.   Xarelto 20 MG Tabs tablet Generic drug: rivaroxaban Take 20 mg by mouth every morning.       Follow-up Information    Follow up with PCP in 2-3 weeks. Schedule an appointment as soon as possible for a visit.        [name], MD Follow up.   Specialty: Cardiology Why: as scheduled  Contact information: [place] [address] [phone]              No Known Allergies  Procedures/Studies: CT Head Wo Contrast  Result Date: 08/29/2020 CLINICAL DATA:  Syncope and fall EXAM: CT HEAD WITHOUT CONTRAST CT CERVICAL SPINE WITHOUT CONTRAST TECHNIQUE: Multidetector CT imaging of the head and cervical spine was performed following the standard protocol without intravenous contrast. Multiplanar CT image reconstructions of the cervical spine were also generated. COMPARISON:  None. FINDINGS: CT HEAD FINDINGS Brain: There is no mass, hemorrhage or extra-axial collection. The size and configuration of the ventricles and extra-axial CSF spaces are normal. There is hypoattenuation of the periventricular white matter, most commonly indicating chronic ischemic microangiopathy. Vascular: No abnormal hyperdensity of the major intracranial arteries or dural venous sinuses. No intracranial atherosclerosis. Skull: Small right frontal scalp hematoma.  No skull fracture. Sinuses/Orbits: No fluid levels or advanced mucosal thickening of the visualized paranasal sinuses. No mastoid or middle ear effusion. The orbits are normal. CT CERVICAL SPINE FINDINGS Alignment: No static subluxation. Facets are aligned. Occipital condyles are normally positioned. Skull base and vertebrae: No acute fracture. Soft tissues and  spinal canal: No prevertebral fluid or swelling. No visible canal hematoma. Disc levels: Severe disc height loss at C3-5. Upper chest: No pneumothorax, pulmonary nodule or pleural effusion. Other: Normal visualized paraspinal cervical soft tissues. IMPRESSION: 1. Advanced chronic ischemic microangiopathy without acute intracranial abnormality. 2. No acute fracture or static subluxation of the cervical spine. 3. Small right frontal scalp hematoma. Electronically Signed   By: Ulyses Jarred M.D.   On: 08/29/2020 23:05   CT Cervical Spine Wo Contrast  Result Date: 08/29/2020 CLINICAL DATA:  Syncope and fall EXAM: CT HEAD WITHOUT CONTRAST CT CERVICAL SPINE WITHOUT CONTRAST TECHNIQUE: Multidetector CT imaging of the head and cervical spine was performed following the standard protocol without intravenous contrast. Multiplanar CT image reconstructions of the cervical spine were also generated. COMPARISON:  None. FINDINGS: CT HEAD FINDINGS Brain: There is no mass, hemorrhage or extra-axial collection. The size and configuration of the ventricles and extra-axial CSF spaces are normal. There is hypoattenuation of the periventricular white matter, most commonly indicating chronic ischemic microangiopathy. Vascular: No abnormal hyperdensity of the major intracranial arteries or dural venous sinuses. No intracranial atherosclerosis. Skull: Small right frontal scalp hematoma.  No skull fracture. Sinuses/Orbits: No fluid levels or advanced mucosal thickening of the visualized paranasal sinuses. No mastoid or middle  ear effusion. The orbits are normal. CT CERVICAL SPINE FINDINGS Alignment: No static subluxation. Facets are aligned. Occipital condyles are normally positioned. Skull base and vertebrae: No acute fracture. Soft tissues and spinal canal: No prevertebral fluid or swelling. No visible canal hematoma. Disc levels: Severe disc height loss at C3-5. Upper chest: No pneumothorax, pulmonary nodule or pleural effusion.  Other: Normal visualized paraspinal cervical soft tissues. IMPRESSION: 1. Advanced chronic ischemic microangiopathy without acute intracranial abnormality. 2. No acute fracture or static subluxation of the cervical spine. 3. Small right frontal scalp hematoma. Electronically Signed   By: Ulyses Jarred M.D.   On: 08/29/2020 23:05   DG Pelvis Portable  Result Date: 08/29/2020 CLINICAL DATA:  Recent fall EXAM: PORTABLE PELVIS 1 VIEWS COMPARISON:  None. FINDINGS: Pelvic ring is intact. No acute fracture or dislocation is noted. Degenerative changes of lumbar spine are noted. IMPRESSION: No acute abnormality noted. Electronically Signed   By: Inez Catalina M.D.   On: 08/29/2020 22:40   DG Chest Portable 1 View  Result Date: 08/29/2020 CLINICAL DATA:  Recent fall, initial encounter EXAM: PORTABLE CHEST 1 VIEW COMPARISON:  05/14/2020 FINDINGS: Cardiac shadow is within normal limits. Aortic calcifications are seen. The lungs are well aerated bilaterally. No bony abnormality is seen. IMPRESSION: No active disease. Electronically Signed   By: Inez Catalina M.D.   On: 08/29/2020 22:41   DG Hand Complete Right  Result Date: 08/29/2020 CLINICAL DATA:  Recent fall with right hand pain, initial encounter EXAM: RIGHT HAND - COMPLETE 3+ VIEW COMPARISON:  None. FINDINGS: Widening of the scapholunate space is noted likely related to chronic ligamentous injury. Bone cyst is noted in the distal aspect radius without complicating factors. Degenerative changes of the distal radioulnar joint are seen. No acute fracture or dislocation is noted. No soft tissue abnormality is noted. IMPRESSION: Chronic appearing changes without acute abnormality. Electronically Signed   By: Inez Catalina M.D.   On: 08/29/2020 22:39     Subjective: Eager to go home  Discharge Exam: Vitals:   08/31/20 0352 08/31/20 0815  BP: (!) 152/75 128/74  Pulse: (!) 55 (!) 56  Resp: 15 20  Temp: 98.3 F (36.8 C) 97.6 F (36.4 C)  SpO2: 94% 93%    Vitals:   08/30/20 1944 08/30/20 2342 08/31/20 0352 08/31/20 0815  BP: (!) 143/71 (!) 149/75 (!) 152/75 128/74  Pulse: (!) 56 (!) 57 (!) 55 (!) 56  Resp: '12 15 15 20  '$ Temp: 98.3 F (36.8 C) 98.1 F (36.7 C) 98.3 F (36.8 C) 97.6 F (36.4 C)  TempSrc: Oral Oral Oral Oral  SpO2: 95% 94% 94% 93%  Weight:   70.4 kg   Height:        General: Pt is alert, awake, not in acute distress Cardiovascular: RRR, S1/S2 +, no rubs, no gallops Respiratory: CTA bilaterally, no wheezing, no rhonchi Abdominal: Soft, NT, ND, bowel sounds + Extremities: no edema, no cyanosis   The results of significant diagnostics from this hospitalization (including imaging, microbiology, ancillary and laboratory) are listed below for reference.     Microbiology: Recent Results (from the past 240 hour(s))  SARS CORONAVIRUS 2 (TAT 6-24 HRS) Nasopharyngeal Nasopharyngeal Swab     Status: None   Collection Time: 08/30/20  4:15 AM   Specimen: Nasopharyngeal Swab  Result Value Ref Range Status   SARS Coronavirus 2 NEGATIVE NEGATIVE Final    Comment: (NOTE) SARS-CoV-2 target nucleic acids are NOT DETECTED.  The SARS-CoV-2 RNA is generally detectable in  upper and lower respiratory specimens during the acute phase of infection. Negative results do not preclude SARS-CoV-2 infection, do not rule out co-infections with other pathogens, and should not be used as the sole basis for treatment or other patient management decisions. Negative results must be combined with clinical observations, patient history, and epidemiological information. The expected result is Negative.  Fact Sheet for Patients: SugarRoll.be  Fact Sheet for Healthcare Providers: https://www.woods-mathews.com/  This test is not yet approved or cleared by the [place] FDA and  has been authorized for detection and/or diagnosis of SARS-CoV-2 by FDA under an Emergency Use Authorization (EUA). This  EUA will remain  in effect (meaning this test can be used) for the duration of the COVID-19 declaration under Se ction 564(b)(1) of the Act, 21 U.S.C. section 360bbb-3(b)(1), unless the authorization is terminated or revoked sooner.  Performed at [facility], [place] [address] [place], [place] [address]      Labs: BNP (last 3 results) No results for input(s): BNP in the last 8760 hours. Basic Metabolic Panel: Recent Labs  Lab 08/29/20 2225 08/30/20 0243 08/31/20 0101  NA 135 136 133*  K 5.0 4.4 4.0  CL 95* 97* 98  CO2 '30 29 28  '$ GLUCOSE 95 114* 99  BUN 45* 43* 30*  CREATININE 2.42* 2.08* 1.55*  CALCIUM 8.9 8.7* 8.6*   Liver Function Tests: Recent Labs  Lab 08/29/20 2225 08/31/20 0101  AST 43* 31  ALT 36 27  ALKPHOS 91 86  BILITOT 0.8 1.0  PROT 7.0 6.2*  ALBUMIN 3.6 2.9*   No results for input(s): LIPASE, AMYLASE in the last 168 hours. No results for input(s): AMMONIA in the last 168 hours. CBC: Recent Labs  Lab 08/29/20 2225 08/30/20 0243 08/31/20 0101  WBC 8.5 9.9 8.7  NEUTROABS 5.4  --   --   HGB 15.1 14.5 14.0  HCT 45.8 43.6 41.8  MCV 97.2 96.5 95.4  PLT 232 231 212   Cardiac Enzymes: No results for input(s): CKTOTAL, CKMB, CKMBINDEX, TROPONINI in the last 168 hours. BNP: Invalid input(s): POCBNP CBG: No results for input(s): GLUCAP in the last 168 hours. D-Dimer No results for input(s): DDIMER in the last 72 hours. Hgb A1c No results for input(s): HGBA1C in the last 72 hours. Lipid Profile No results for input(s): CHOL, HDL, LDLCALC, TRIG, CHOLHDL, LDLDIRECT in the last 72 hours. Thyroid function studies No results for input(s): TSH, T4TOTAL, T3FREE, THYROIDAB in the last 72 hours.  Invalid input(s): FREET3 Anemia work up No results for input(s): VITAMINB12, FOLATE, FERRITIN, TIBC, IRON, RETICCTPCT in the last 72 hours. Urinalysis    Component Value Date/Time   COLORURINE STRAW (A) 08/30/2020 0051   APPEARANCEUR CLEAR 08/30/2020  0051   LABSPEC 1.006 08/30/2020 0051   PHURINE 7.0 08/30/2020 0051   GLUCOSEU NEGATIVE 08/30/2020 0051   HGBUR NEGATIVE 08/30/2020 0051   BILIRUBINUR NEGATIVE 08/30/2020 0051   KETONESUR NEGATIVE 08/30/2020 0051   PROTEINUR NEGATIVE 08/30/2020 0051   NITRITE NEGATIVE 08/30/2020 0051   LEUKOCYTESUR NEGATIVE 08/30/2020 0051   Sepsis Labs Invalid input(s): PROCALCITONIN,  WBC,  LACTICIDVEN Microbiology Recent Results (from the past 240 hour(s))  SARS CORONAVIRUS 2 (TAT 6-24 HRS) Nasopharyngeal Nasopharyngeal Swab     Status: None   Collection Time: 08/30/20  4:15 AM   Specimen: Nasopharyngeal Swab  Result Value Ref Range Status   SARS Coronavirus 2 NEGATIVE NEGATIVE Final    Comment: (NOTE) SARS-CoV-2 target nucleic acids are NOT DETECTED.  The SARS-CoV-2  RNA is generally detectable in upper and lower respiratory specimens during the acute phase of infection. Negative results do not preclude SARS-CoV-2 infection, do not rule out co-infections with other pathogens, and should not be used as the sole basis for treatment or other patient management decisions. Negative results must be combined with clinical observations, patient history, and epidemiological information. The expected result is Negative.  Fact Sheet for Patients: SugarRoll.be  Fact Sheet for Healthcare Providers: https://www.woods-mathews.com/  This test is not yet approved or cleared by the [place] FDA and  has been authorized for detection and/or diagnosis of SARS-CoV-2 by FDA under an Emergency Use Authorization (EUA). This EUA will remain  in effect (meaning this test can be used) for the duration of the COVID-19 declaration under Se ction 564(b)(1) of the Act, 21 U.S.C. section 360bbb-3(b)(1), unless the authorization is terminated or revoked sooner.  Performed at [facility], [place] [address], [place], [place] [address]    Time spent:  35mn  SIGNED:   SMarylu Lund MD  [facility] 08/31/2020, 10:18 AM  If 7PM-7AM, please contact night-coverage

## 2020-09-02 ENCOUNTER — Encounter: Payer: Self-pay | Admitting: Acute Care

## 2020-09-05 ENCOUNTER — Ambulatory Visit: Payer: Medicare HMO | Admitting: Student

## 2020-09-12 NOTE — Progress Notes (Signed)
Primary Physician/Referring:  Pcp, No  Patient ID: Mario Proctor, male    DOB: Dec 23, 1940, 80 y.o.   MRN: [id number]  Chief Complaint  Patient presents with  . Loss of Consciousness  . Follow-up   HPI:    Mario Proctor  is a 80 y.o. Caucasian male patient  with nonischemic cardiomyopathy by coronary angiogram on 11/02/2016 revealing ejection fraction of 30-35%, ongoning tobacco use disorder, and permanent A fib. He is asymptomatic in regards to his A fib and is now on anticoagulation.  Echocardiogram 03/2020 revealed significantly decreased LVEF at 25%.  Patient was therefore started on amiodarone and underwent successful cardioversion 07/30/2020.  Since last visit patient was hospitalized 08/30/2020 - 08/31/2020 following an episode of syncope. Evaluation in the hospital revealed creatinine of 2.42, acute kidney injury superimposed on CKD likely secondary to dehydration, patient was given fluids and improved, therefore was discharged with furosemide 10 mg twice daily as needed.  Patient now presents for follow-up. He has only needed Lasix 10 mg once since discharged form the hospital due to 2-3 lb. weight gain, which resolved with single dose of Lasix.  Patient has been monitoring his blood pressure at home with readings averaging 135-140/70's mmHg.  He does continue to have occasional episodes of brief dizziness when going from sitting to standing.  Unfortunately continues to smoke approximately fourth pack per day.  He has notably increased water intake, but also continues have high salt intake in his diet.  Patient is otherwise doing well without specific complaints.  Denies chest pain, palpitations, dyspnea, leg swelling, orthopnea, PND.  Patient's report of syncopal episode prior to hospitalization on 08/30/2020 revealed symptoms suggestive of syncope secondary to dehydration versus vasovagal.  Past Medical History:  Diagnosis Date  . A-fib ([place])   . Allergy   . CHF (congestive heart  failure) ([place])   . Dupuytren contracture    right sm finger  . Myocardial infarct ([place]) 2019  . Myocardial infarction ([place])   . Small bowel obstruction [facility])    Past Surgical History:  Procedure Laterality Date  . CARDIOVERSION N/A 07/30/2020   Procedure: CARDIOVERSION;  Surgeon: [name], MD;  Location: [place];  Service: Cardiovascular;  Laterality: N/A;  . COLON SURGERY     bowel blockage  . DUPUYTREN CONTRACTURE RELEASE Left 2011  . FASCIECTOMY Right 05/02/2015   Procedure: FASCIECTOMY RIGHT SMALL FINGER;  Surgeon: Daryll Brod, MD;  Location: [place];  Service: Orthopedics;  Laterality: Right;  axillary block in preop  . LEFT HEART CATH AND CORONARY ANGIOGRAPHY N/A 11/03/2016   Procedure: Left Heart Cath and Coronary Angiography;  Surgeon: [name], MD;  Location: [place] CV [facility];  Service: Cardiovascular;  Laterality: N/A;  . TONSILLECTOMY    . TYMPANOPLASTY Right    Family History  Problem Relation Age of Onset  . Cancer Brother        "in heart and lungs"     Social History   Tobacco Use  . Smoking status: Current Every Day Smoker    Packs/day: 0.50    Years: 50.00    Pack years: 25.00    Types: Cigarettes  . Smokeless tobacco: Never Used  . Tobacco comment: currently smoking .5ppd as of 05/14/20  Substance Use Topics  . Alcohol use: Yes    Alcohol/week: 6.0 standard drinks    Types: 6 Cans of beer per week    Comment: social   Marital Status: Married  ROS  Review of Systems  Constitutional: Negative for malaise/fatigue.  Cardiovascular: Positive for dyspnea on exertion (chronic, stable). Negative for chest pain, claudication, leg swelling, near-syncope, orthopnea, palpitations, paroxysmal nocturnal dyspnea and syncope.  Respiratory: Positive for cough (chronic, stable). Negative for shortness of breath.   Endocrine: Negative for cold intolerance.  Hematologic/Lymphatic: Does not bruise/bleed easily.  Musculoskeletal: Negative for  joint swelling.  Gastrointestinal: Negative for abdominal pain, anorexia, change in bowel habit, hematochezia and melena.  Neurological: Negative for dizziness, headaches, light-headedness and weakness.  Psychiatric/Behavioral: Negative for depression and substance abuse.   Objective  Blood pressure 138/65, pulse 65, temperature 98.1 F (36.7 C), height '5\' 11"'$  (1.803 m), weight 166 lb (75.3 kg), SpO2 97 %.  Vitals with BMI 09/16/2020 08/31/2020 08/31/2020  Height '5\' 11"'$  - -  Weight 166 lbs - -  BMI 123456 - -  Systolic 0000000 123456 0000000  Diastolic 65 80 74  Pulse 65 55 56     Physical Exam Constitutional:      Appearance: He is well-developed.  HENT:     Head: Atraumatic.  Eyes:     Conjunctiva/sclera: Conjunctivae normal.  Neck:     Thyroid: No thyromegaly.     Vascular: No JVD.  Cardiovascular:     Rate and Rhythm: Normal rate and regular rhythm.     Pulses: Intact distal pulses.          Carotid pulses are 2+ on the right side and 2+ on the left side.      Radial pulses are 2+ on the right side and 2+ on the left side.       Femoral pulses are 2+ on the right side and 2+ on the left side.      Popliteal pulses are 2+ on the right side and 2+ on the left side.       Dorsalis pedis pulses are 1+ on the right side and 1+ on the left side.       Posterior tibial pulses are 1+ on the right side and 1+ on the left side.     Heart sounds: Heart sounds are distant. No murmur heard. No gallop.      Comments: No JVD.  Pulmonary:     Effort: Pulmonary effort is normal. No accessory muscle usage or respiratory distress.     Breath sounds: Rhonchi present. No wheezing.  Musculoskeletal:        General: Normal range of motion.     Cervical back: Neck supple.     Right lower leg: No edema.     Left lower leg: No edema.  Skin:    General: Skin is warm and dry.  Neurological:     Mental Status: He is alert.    Laboratory examination:   Recent Labs    06/12/20 0947 06/20/20 0950  07/04/20 0920 07/23/20 1052 08/29/20 2225 08/30/20 0243 08/31/20 0101  NA 131* 131* 132*   < > 135 136 133*  K 5.5* 5.0 5.2   < > 5.0 4.4 4.0  CL 95* 94* 93*   < > 95* 97* 98  CO2 '22 21 27   '$ < > '30 29 28  '$ GLUCOSE 102* 60* 80   < > 95 114* 99  BUN '23 21 21   '$ < > 45* 43* 30*  CREATININE 1.53* 1.49* 1.66*   < > 2.42* 2.08* 1.55*  CALCIUM 8.7 8.8 9.0   < > 8.9 8.7* 8.6*  GFRNONAA 43* 44* 39*  --  27* 32* 46*  GFRAA  50* 51* 45*  --   --   --   --    < > = values in this interval not displayed.   estimated creatinine clearance is 41.8 mL/min (A) (by C-G formula based on SCr of 1.55 mg/dL (H)).  CMP Latest Ref Rng & Units 08/31/2020 08/30/2020 08/29/2020  Glucose 70 - 99 mg/dL 99 114(H) 95  BUN 8 - 23 mg/dL 30(H) 43(H) 45(H)  Creatinine 0.61 - 1.24 mg/dL 1.55(H) 2.08(H) 2.42(H)  Sodium 135 - 145 mmol/L 133(L) 136 135  Potassium 3.5 - 5.1 mmol/L 4.0 4.4 5.0  Chloride 98 - 111 mmol/L 98 97(L) 95(L)  CO2 22 - 32 mmol/L '28 29 30  '$ Calcium 8.9 - 10.3 mg/dL 8.6(L) 8.7(L) 8.9  Total Protein 6.5 - 8.1 g/dL 6.2(L) - 7.0  Total Bilirubin 0.3 - 1.2 mg/dL 1.0 - 0.8  Alkaline Phos 38 - 126 U/L 86 - 91  AST 15 - 41 U/L 31 - 43(H)  ALT 0 - 44 U/L 27 - 36   CBC Latest Ref Rng & Units 08/31/2020 08/30/2020 08/29/2020  WBC 4.0 - 10.5 K/uL 8.7 9.9 8.5  Hemoglobin 13.0 - 17.0 g/dL 14.0 14.5 15.1  Hematocrit 39.0 - 52.0 % 41.8 43.6 45.8  Platelets 150 - 400 K/uL 212 231 232   Lipid Panel     Component Value Date/Time   CHOL 139 06/12/2020 0947   TRIG 61 06/12/2020 0947   HDL 66 06/12/2020 0947   LDLCALC 60 06/12/2020 0947   External labs: None  Medications and allergies   No Active Allergies   Current Outpatient Medications  Medication Instructions  . albuterol (ACCUNEB) 1.25 MG/3ML nebulizer solution 1 ampule, Nebulization, Every 4 hours PRN  . albuterol (VENTOLIN HFA) 108 (90 Base) MCG/ACT inhaler INHALE 2 PUFF BY MOUTH EVERY 6 HOURS AS NEEDED FOR WHEEZE OR SHORTNESS OF BREATH  . albuterol  (VENTOLIN HFA) 108 (90 Base) MCG/ACT inhaler 2 puffs, Inhalation, Every 6 hours PRN  . amiodarone (PACERONE) 200 mg, Oral, Daily  . budesonide (PULMICORT) 0.5 mg, Nebulization, Daily PRN  . diphenhydrAMINE (BENADRYL) 25 mg, Oral, Every 6 hours PRN  . ENTRESTO 97-103 MG 1 tablet, Oral, Daily  . furosemide (LASIX) 10 mg, Oral, 2 times daily PRN  . metoprolol succinate (TOPROL-XL) 25 mg, Oral, Daily  . rosuvastatin (CRESTOR) 5 MG tablet TAKE 1 TABLET BY MOUTH EVERY DAY  . TRELEGY ELLIPTA 100-62.5-25 MCG/INH AEPB INHALE 1 PUFF INTO THE LUNGS EVERY DAY AS NEEDED  . triamcinolone cream (KENALOG) 0.1 % 1 application, Topical, 2 times daily  . Xarelto 20 mg, Oral, Every morning   Radiology:   Low-dose CT chest 08/16/2019: Coronary and aortic atherosclerosis and calcification.  Severe centrilobular emphysema.  2 benign looking nodules.  Benign right upper kidney mass.  Chest x-ray 08/29/2020: Cardiac shadow is within normal limits. Aortic calcifications are seen. The lungs are well aerated bilaterally. No bony abnormality is seen. IMPRESSION: No active disease.   CT head without contrast 08/29/2020: 1. Advanced chronic ischemic microangiopathy without acute intracranial abnormality. 2. No acute fracture or static subluxation of the cervical spine. 3. Small right frontal scalp hematoma.  Cardiac Studies:   Lexiscan myoview stress test 09/18/2016: 1. The resting electrocardiogram demonstrated normal sinus rhythm, normal resting conduction, no resting arrhythmias and normal rest repolarization. Stress EKG is non-diagnostic for ischemia as it a pharmacologic stress using Lexiscan. Stress symptoms included dyspnea. Occasional PVC noted. 2. The LV is dilated both at rest and stress images. The LV  end diastolic volume was 0000000. SPECT images demonstrate Medium perfusion abnormality of moderate intensity in the basal inferior, mid inferior and apical inferior myocardial wall(s) on the stress images. The  defect remains relatively unchanged between rest and stress images and is a soft tissue attenuation artifact, however scar in this region without ischemia cannot be completely excluded. The left ventricular ejection fraction was calculated or visually estimated to be 25% with global hypokinesis. High risk study.  Abdominal aortic duplex 10/06/2016: Diffuse plaque noted in the proximal, mid and distal aorta. No AAA observed.  Coronary angiogram 11/03/2016: Severe left ventricular systolic dysfunction. The left ventricular ejection fraction is 30-35% by visual estimate with inferior wall akinesis.There is no mitral valve regurgitation. Normal LVEDP. Normal coronary arteries.  Holter Monitor 48 hours 09/07/2017: Minimum heart rate 38 bpm at 12:13 AM maximum heart rate 154 bpm at 12:59 PM. PVCs consisted of 3600 beats, 1.5% burden. Occasional ventricular and triplets, 194 couplets.. Bigeminy and trigeminy. 2 episodes of 4 beat 3 beat NSVT. Predominant rhythm was atrial fibrillation.  PCV ECHOCARDIOGRAM COMPLETE 03/15/2020 Severely depressed LV systolic function with visual EF 20-25%. Left ventricle cavity is moderately dilated. Dilated cardiomyopathy. Mild concentric hypertrophy of the left ventricle. Hypokinetic global wall motion. Doppler evidence of grade I (impaired) diastolic dysfunction, elevated LAP. Left atrial cavity is severely dilated at 4.6 cm. Structurally normal mitral valve.  Mild to moderate mitral regurgitation. Mild to moderate tricuspid regurgitation. Moderate pulmonary hypertension. RVSP measures 44 mmHg. CVP estimated at 15 mm Hg. IVC is dilated with poor inspiration collapse consistent with elevated right atrial pressure. Compared to 02/08/2018, EF has further reduced from 35%.   Direct current cardioversion 07/30/20 : Indication symptomatic A. Fibrillation. Procedure: Using 80 mg of IV Propofol and 60 IV Lidocaine (for reducing venous pain) for achieving deep sedation,  synchronized direct current cardioversion performed. Patient was delivered with 150 Joules of electricity X 1 with success to NSR. Patient tolerated the procedure well. No immediate complication noted.   EKG:   EKG 08/13/2020: Sinus bradycardia at a rate of 58 bpm.  Left axis, left anterior fascicular block.  Poor R wave progression, cannot exclude anteroseptal infarct old.  LVH.  Compared to EKG 05/21/2020, now in sinus rhythm.  EKG 05/21/2020: Atrial fibrillation with controlled ventricular response at a rate of 58 bpm.  Left axis, left anterior fascicular block.  LVH. Poor R wave progression, cannot exclude anteroseptal infarct old.Compared to EKG 03/07/2020, no ectopic ventricular beats.  EKG 03/07/2020: Atrial fibrillation with controlled ventricular response at a rate of 61 beats per with ectopic ventricular beats (2).  Left axis deviation, left anterior fascicular block.  Left ventricular hypertrophy. Poor R wave progression, cannot exclude anterior septal infarct old.  Compared to EKG 09/07/2019, no significant change  Assessment     ICD-10-CM   1. Chronic combined systolic and diastolic CHF (congestive heart failure) (H[facility])  I50.42 PCV ECHOCARDIOGRAM COMPLETE    Basic metabolic panel  2. Paroxysmal atrial fibrillation ([facility])  I48.0   3. Coronary artery calcification seen on CAT scan  I25.10     No orders of the defined types were placed in this encounter.   Medications Discontinued During This Encounter  Medication Reason  . doxycycline (VIBRA-TABS) 100 MG tablet Error  . ENTRESTO 99991111 MG Duplicate  . metoprolol succinate (TOPROL-XL) 50 MG 24 hr tablet Error  . rosuvastatin (CRESTOR) 5 MG tablet Error  . Torsemide 40 MG TABS Error  . TRELEGY ELLIPTA 100-62.5-25 MCG/INH AEPB Error  .  XARELTO 20 MG TABS tablet Duplicate  . amiodarone (PACERONE) 200 MG tablet Error    Recommendations:   Mario Proctor  is a 80 y.o.  Caucasian male patient  with nonischemic cardiomyopathy by  coronary angiogram on 11/02/2016 revealing ejection fraction of 30-35%, ongoning tobacco use disorder, and permanent A fib. He is asymptomatic in regards to his A fib and is now on anticoagulation.  Echocardiogram 03/2020 revealed significantly decreased LVEF at 25%.  Patient was therefore started on amiodarone and underwent successful cardioversion 07/30/2020.  Since last visit patient was hospitalized 08/30/2020 - 08/31/2020 following an episode of syncope.  Valuation in the hospital revealed creatinine of 2.42, acute kidney injury superimposed on CKD likely secondary to dehydration, patient was given fluids and improved, therefore was discharged with furosemide 10 mg twice daily as needed.  Patient now presents for follow-up.  Patient's episode of syncope likely secondary to dehydration.  Upon discharge from the hospital patient was advised to take Lasix 10 mg daily as needed, he is only needed this once since discharge.  His metoprolol was also reduced to 25 mg daily from 50 mg once daily.  Notably patient's blood pressure is mildly elevated, however he has very high dietary salt intake.  In view of recent episode of syncope as well as continued orthostatic dizziness will not make changes to medications at this time, but rather advised patient to reduce salt intake and increase water intake.  Patient appears motivated to make diet and lifestyle modifications.  Also again advised him to focus on smoking cessation.  In regard to atrial fibrillation, patient appears to be in sinus rhythm on exam at this time.  We will plan to repeat echocardiogram to reevaluate left ventricular function.  We will also repeat BMP at this time.  I have advised patient to follow-up with his PCP regarding further management of renal disease, may consider nephrology referral.   As patient is on amiodarone, he will need annual monitoring of PFTs, liver function, and thyroid function.  Follow-up in 2 months, sooner if needed, for  cardiomyopathy, heart failure, atrial fibrillation, and hypertension.   Alethia Berthold, PA-C 09/16/2020, 1:58 PM Office: [phone]

## 2020-09-16 ENCOUNTER — Other Ambulatory Visit: Payer: Self-pay

## 2020-09-16 ENCOUNTER — Encounter: Payer: Self-pay | Admitting: Student

## 2020-09-16 ENCOUNTER — Ambulatory Visit: Payer: Medicare HMO | Admitting: Student

## 2020-09-16 VITALS — BP 138/65 | HR 65 | Temp 98.1°F | Ht 71.0 in | Wt 166.0 lb

## 2020-09-16 DIAGNOSIS — I480 Paroxysmal atrial fibrillation: Secondary | ICD-10-CM

## 2020-09-16 DIAGNOSIS — I2510 Atherosclerotic heart disease of native coronary artery without angina pectoris: Secondary | ICD-10-CM

## 2020-09-16 DIAGNOSIS — I5042 Chronic combined systolic (congestive) and diastolic (congestive) heart failure: Secondary | ICD-10-CM

## 2020-09-19 ENCOUNTER — Other Ambulatory Visit: Payer: Self-pay | Admitting: Pulmonary Disease

## 2020-09-29 ENCOUNTER — Other Ambulatory Visit: Payer: Self-pay | Admitting: Student

## 2020-10-14 ENCOUNTER — Other Ambulatory Visit: Payer: Self-pay

## 2020-10-14 ENCOUNTER — Ambulatory Visit
Admission: RE | Admit: 2020-10-14 | Discharge: 2020-10-14 | Disposition: A | Discharge status: Home / Self Care | Payer: Medicare HMO | Source: Ambulatory Visit | Attending: Acute Care | Admitting: Acute Care

## 2020-10-14 DIAGNOSIS — I700 Atherosclerosis of aorta: Secondary | ICD-10-CM | POA: Diagnosis not present

## 2020-10-14 DIAGNOSIS — J069 Acute upper respiratory infection, unspecified: Secondary | ICD-10-CM

## 2020-10-14 DIAGNOSIS — R9389 Abnormal findings on diagnostic imaging of other specified body structures: Secondary | ICD-10-CM

## 2020-10-14 DIAGNOSIS — J432 Centrilobular emphysema: Secondary | ICD-10-CM | POA: Diagnosis not present

## 2020-10-14 DIAGNOSIS — I2510 Atherosclerotic heart disease of native coronary artery without angina pectoris: Secondary | ICD-10-CM | POA: Diagnosis not present

## 2020-10-14 DIAGNOSIS — R918 Other nonspecific abnormal finding of lung field: Secondary | ICD-10-CM | POA: Diagnosis not present

## 2020-10-16 ENCOUNTER — Other Ambulatory Visit: Payer: Medicare HMO

## 2020-10-21 ENCOUNTER — Telehealth: Payer: Self-pay | Admitting: Acute Care

## 2020-10-21 ENCOUNTER — Ambulatory Visit: Payer: Medicare HMO | Admitting: Acute Care

## 2020-10-21 DIAGNOSIS — F17200 Nicotine dependence, unspecified, uncomplicated: Secondary | ICD-10-CM

## 2020-10-21 NOTE — Telephone Encounter (Signed)
-----   Message from Magdalen Spatz, NP sent at 10/21/2020  5:28 PM EDT ----- I have called the patient with the results of his scan. Recommendation is to return to the Lung Cancer Screening population, but patient is now beyond the age limit for screening. I told him we will do a follow up through Dr. Vaughan Browner in 12 months. He is in agreement with this plan.  Naveh Rickles, please  place an  order for a follow up CT Chest without contrast in 12 months. Thanks so much.

## 2020-10-21 NOTE — Progress Notes (Signed)
I have called the patient with the results of his scan. Recommendation is to return to the Lung Cancer Screening population, but patient is now beyond the age limit for screening. I told him we will do a follow up through Dr. Vaughan Browner in 12 months. He is in agreement with this plan.  Amy, please  place an  order for a follow up CT Chest without contrast in 12 months. Thanks so much.

## 2020-10-21 NOTE — Telephone Encounter (Signed)
Ct chest w/o contrast ordered for 12 months. Nothing further needed.

## 2020-10-22 ENCOUNTER — Other Ambulatory Visit: Payer: Medicare HMO

## 2020-10-23 ENCOUNTER — Other Ambulatory Visit: Payer: Self-pay | Admitting: Student

## 2020-10-23 DIAGNOSIS — I4821 Permanent atrial fibrillation: Secondary | ICD-10-CM

## 2020-10-23 NOTE — Telephone Encounter (Signed)
Refill request

## 2020-10-28 ENCOUNTER — Encounter: Payer: Self-pay | Admitting: Acute Care

## 2020-10-28 ENCOUNTER — Ambulatory Visit: Payer: Medicare HMO | Admitting: Acute Care

## 2020-10-28 ENCOUNTER — Other Ambulatory Visit: Payer: Self-pay

## 2020-10-28 VITALS — BP 140/80 | HR 68 | Temp 97.4°F | Ht 71.0 in | Wt 161.4 lb

## 2020-10-28 DIAGNOSIS — J441 Chronic obstructive pulmonary disease with (acute) exacerbation: Secondary | ICD-10-CM | POA: Diagnosis not present

## 2020-10-28 DIAGNOSIS — Z720 Tobacco use: Secondary | ICD-10-CM

## 2020-10-28 DIAGNOSIS — F17210 Nicotine dependence, cigarettes, uncomplicated: Secondary | ICD-10-CM | POA: Diagnosis not present

## 2020-10-28 DIAGNOSIS — R911 Solitary pulmonary nodule: Secondary | ICD-10-CM

## 2020-10-28 DIAGNOSIS — R69 Illness, unspecified: Secondary | ICD-10-CM | POA: Diagnosis not present

## 2020-10-28 NOTE — Patient Instructions (Addendum)
It is good to see you today. We will schedule a 12 month Chest CT without contrast. ( 10/2021) Continue Trelegy 1 puff once daily  Continue using albuterol as rescue as needed.  Note your daily symptoms > remember "red flags" for COPD:  Increase in cough, increase in sputum production, increase in shortness of breath or activity intolerance. If you notice these symptoms, please call to be seen.  If you experience chest congestion, use flutter valve and Mucinex. Remember to hydrate after being in the sun. Follow up in 6 months with Judson Roch NP of Dr. Vaughan Browner Please quit smoking. This is the single most powerful action you can take to decrease your risk of lung cancer.  Please contact office for sooner follow up if symptoms do not improve or worsen or seek emergency care .

## 2020-10-28 NOTE — Progress Notes (Signed)
History of Present Illness Mario Proctor is a 80 y.o. male current every day smoker ( 34 pack year smoking hisotry) with COPD Gold A, dyspnea on exertion, A. fib, cardiomyopathy, chronic combined systolic and diastolic heart failure. He is followed by Dr. Vaughan Proctor and through the lung cancer screening program.    10/28/2020  Pt. Presents for follow up of repeat Low Dose Ct for Lung Cancer Screening. His scan 08/2020 was abnormal, read as a Lung RADS 4 B indicates suspicious , but was thought to be infectious or inflammatory. She was treated with Doxycycline and flutter valve. Repeat CT Chest showed resolution of concerning nodule. Pt. States she has been doing well. Using Trelegy, rare need for rescue inhaler. He knows to use the flutter valve when he has chest congestion with the Mucinex.  He has no fever, no complaints. I counseled him to continue to work on quitting smoking completely.   Test Results: 10/2020 CT Chest  The nodular area of concern in the inferior right lower lobe on the previous study has nearly resolved in the interval with only some very subtle ground-glass attenuation in this location today. Given the interval near complete resolution, findings were likely related to infectious/inflammatory etiology. Recommend return to routine lung cancer screening  08/2020 Low Dose CT Chest Lung-RADS 4B, suspicious. New 17.4 mm irregular nodular opacity in the right lower lobe. Given that airways proximal to this lesion in the right lower lobe are impacted, this may be infectious/inflammatory or aspiration related. Additional imaging evaluation or consultation with Pulmonology or Thoracic Surgery recommended.  CBC Latest Ref Rng & Units 08/31/2020 08/30/2020 08/29/2020  WBC 4.0 - 10.5 K/uL 8.7 9.9 8.5  Hemoglobin 13.0 - 17.0 g/dL 14.0 14.5 15.1  Hematocrit 39.0 - 52.0 % 41.8 43.6 45.8  Platelets 150 - 400 K/uL 212 231 232    BMP Latest Ref Rng & Units 08/31/2020 08/30/2020  08/29/2020  Glucose 70 - 99 mg/dL 99 114(H) 95  BUN 8 - 23 mg/dL 30(H) 43(H) 45(H)  Creatinine 0.61 - 1.24 mg/dL 1.55(H) 2.08(H) 2.42(H)  BUN/Creat Ratio 10 - 24 - - -  Sodium 135 - 145 mmol/L 133(L) 136 135  Potassium 3.5 - 5.1 mmol/L 4.0 4.4 5.0  Chloride 98 - 111 mmol/L 98 97(L) 95(L)  CO2 22 - 32 mmol/L '28 29 30  '$ Calcium 8.9 - 10.3 mg/dL 8.6(L) 8.7(L) 8.9    BNP    Component Value Date/Time   BNP 1,256.6 (H) 07/23/2020 1052    ProBNP    Component Value Date/Time   PROBNP 1,322 (H) 08/29/2020 0925   PROBNP 900.0 (H) 05/14/2020 1137    PFT    Component Value Date/Time   FEV1PRE 1.74 01/07/2017 1237   FEV1POST 1.86 01/07/2017 1237   FVCPRE 3.63 01/07/2017 1237   FVCPOST 3.96 01/07/2017 1237   TLC 8.24 01/07/2017 1237   DLCOUNC 15.81 01/07/2017 1237   PREFEV1FVCRT 48 01/07/2017 1237   PSTFEV1FVCRT 47 01/07/2017 1237    CT Chest Wo Contrast  Result Date: 10/14/2020 CLINICAL DATA:  Irregular nodule in the right lower lobe on lung cancer screening CT. EXAM: CT CHEST WITHOUT CONTRAST TECHNIQUE: Multidetector CT imaging of the chest was performed following the standard protocol without IV contrast. COMPARISON:  Lung cancer screening CT 08/16/2020 FINDINGS: Cardiovascular: The heart size is normal. No substantial pericardial effusion. Coronary artery calcification is evident. Atherosclerotic calcification is noted in the wall of the thoracic aorta. Mediastinum/Nodes: Scattered small mediastinal lymph nodes evident. No mediastinal lymphadenopathy.  No evidence for gross hilar lymphadenopathy although assessment is limited by the lack of intravenous contrast on today's study. The esophagus has normal imaging features. There is no axillary lymphadenopathy. Lungs/Pleura: Centrilobular and paraseptal emphysema evident. The nodular area of concern in the inferior right lower lobe on the previous study has effectively resolved in the interval with only some very subtle ground-glass  attenuation in this location today (image 137/5). This change is well demonstrated on coronal imaging (compare coronal 93/3 today to coronal 231/5 previously). No new suspicious pulmonary nodule or mass. Tiny nodule left apex on 30/5 is unchanged. No focal airspace consolidation. No pleural effusion. Upper Abdomen: Stable 2 cm exophytic lesion upper pole right kidney with attenuation higher than would be expected for a simple cyst. This was characterized as nonenhancing exophytic proteinaceous/hemorrhagic cyst on abdomen CT 08/28/2019. Musculoskeletal: No worrisome lytic or sclerotic osseous abnormality. IMPRESSION: 1. The nodular area of concern in the inferior right lower lobe on the previous study has nearly resolved in the interval with only some very subtle ground-glass attenuation in this location today. Given the interval near complete resolution, findings were likely related to infectious/inflammatory etiology. Recommend return to routine lung cancer screening. 2. Aortic Atherosclerosis (ICD10-I70.0) and Emphysema (ICD10-J43.9). Electronically Signed   By: Mario Proctor M.D.   On: 10/14/2020 11:47     Past medical hx Past Medical History:  Diagnosis Date   A-fib [facility])    Allergy    CHF (congestive heart failure) (HCC)    Dupuytren contracture    right sm finger   Myocardial infarct ([place]) 2019   Myocardial infarction ([place])    Small bowel obstruction (HCC)      Social History   Tobacco Use   Smoking status: Every Day    Packs/day: 0.50    Years: 50.00    Pack years: 25.00    Types: Cigarettes   Smokeless tobacco: Never   Tobacco comments:    currently smoking .5ppd as of 05/14/20  Vaping Use   Vaping Use: Never used  Substance Use Topics   Alcohol use: Yes    Alcohol/week: 6.0 standard drinks    Types: 6 Cans of beer per week    Comment: social   Drug use: No    Mr.Mario Proctor reports that he has been smoking cigarettes. He has a 25.00 pack-year smoking history. He has never used  smokeless tobacco. He reports current alcohol use of about 6.0 standard drinks of alcohol per week. He reports that he does not use drugs.  Tobacco Cessation: Current 1/2 pack per day. I have spent 4 minutes counseling patient on smoking cessation this visit. We have reviewed the risks of continued smoking on his current health situation. Patient verbalizes understanding of risks of continued smoking and the negative health consequences including worsening of COPD, risk of lung cancer , stroke and heart disease.Marland Kitchen     Past surgical hx, Family hx, Social hx all reviewed.  Current Outpatient Medications on File Prior to Visit  Medication Sig   albuterol (ACCUNEB) 1.25 MG/3ML nebulizer solution Take 1 ampule by nebulization every 4 (four) hours as needed for wheezing or shortness of breath.   albuterol (VENTOLIN HFA) 108 (90 Base) MCG/ACT inhaler INHALE 2 PUFF BY MOUTH EVERY 6 HOURS AS NEEDED FOR WHEEZE OR SHORTNESS OF BREATH (Patient taking differently: Inhale 2 puffs into the lungs every 6 (six) hours as needed for shortness of breath or wheezing.)   albuterol (VENTOLIN HFA) 108 (90 Base) MCG/ACT inhaler Inhale 2  puffs into the lungs every 6 (six) hours as needed.   amiodarone (PACERONE) 200 MG tablet TAKE 1 TABLET BY MOUTH TWICE A DAY   budesonide (PULMICORT) 0.5 MG/2ML nebulizer solution Take 0.5 mg by nebulization daily as needed (asthma).   diphenhydrAMINE (BENADRYL) 25 MG tablet Take 25 mg by mouth every 6 (six) hours as needed.   ENTRESTO 97-103 MG Take 1 tablet by mouth daily.   furosemide (LASIX) 20 MG tablet Take 10 mg by mouth 2 (two) times daily as needed.   rosuvastatin (CRESTOR) 5 MG tablet TAKE 1 TABLET BY MOUTH EVERY DAY   TRELEGY ELLIPTA 100-62.5-25 MCG/INH AEPB Inhale 1 puff into the lungs daily.   triamcinolone cream (KENALOG) 0.1 % Apply 1 application topically 2 (two) times daily.   XARELTO 20 MG TABS tablet TAKE 1 TABLET BY MOUTH EVERY DAY WITH SUPPER   metoprolol succinate  (TOPROL-XL) 25 MG 24 hr tablet Take 1 tablet (25 mg total) by mouth daily.   No current facility-administered medications on file prior to visit.     No Known Allergies  Review Of Systems:  Constitutional:   No  weight loss, night sweats,  Fevers, chills, fatigue, or  lassitude.  HEENT:   No headaches,  Difficulty swallowing,  Tooth/dental problems, or  Sore throat,                No sneezing, itching, ear ache, nasal congestion, post nasal drip,   CV:  No chest pain,  Orthopnea, PND, swelling in lower extremities, anasarca, dizziness, palpitations, syncope.   GI  No heartburn, indigestion, abdominal pain, nausea, vomiting, diarrhea, change in bowel habits, loss of appetite, bloody stools.   Resp: No shortness of breath with exertion or at rest.  No excess mucus, no productive cough,  No non-productive cough,  No coughing up of blood.  No change in color of mucus.  No wheezing.  No chest wall deformity  Skin: no rash or lesions.  GU: no dysuria, change in color of urine, no urgency or frequency.  No flank pain, no hematuria   MS:  No joint pain or swelling.  No decreased range of motion.  No back pain.  Psych:  No change in mood or affect. No depression or anxiety.  No memory loss.   Vital Signs BP 140/80 (BP Location: Left Arm, Patient Position: Sitting, Cuff Size: Normal)   Pulse 68   Temp (!) 97.4 F (36.3 C) (Oral)   Ht '5\' 11"'$  (1.803 m)   Wt 161 lb 6.4 oz (73.2 kg)   SpO2 92%   BMI 22.51 kg/m    Physical Exam:  General- No distress,  A&O x 3, pleasant ENT: No sinus tenderness, TM clear, pale nasal mucosa, no oral exudate,no post nasal drip, no LAN Cardiac: S1, S2, regular rate and rhythm, no murmur Chest: No wheeze/ rales/ dullness; no accessory muscle use, no nasal flaring, no sternal retractions Abd.: Soft Non-tender, ND, BS +, Body mass index is 22.51 kg/m.  Ext: No clubbing cyanosis, edema Neuro:  normal strength, MAE x 4, A&O x 3 Skin: No rashes, warm and  dry, intact Psych: normal mood and behavior   Assessment/Plan Abnormal Low Dose CT Chest Resolved with treatment with Doxycycline and Mucinex and Flutter valve Continues to smoke, but is working on reducing daily Plan We will schedule a 12 month Chest CT without contrast. ( 10/2021) This will not be through the screening program, but will be through the pulmonary office as he has  aged out of the screening program.  Continue Trelegy 1 puff once daily  Continue using albuterol as rescue as needed.  Note your daily symptoms > remember "red flags" for COPD:  Increase in cough, increase in sputum production, increase in shortness of breath or activity intolerance. If you notice these symptoms, please call to be seen.  If you experience chest congestion, use flutter valve and Mucinex. Remember to hydrate after being in the sun. Follow up in 6 months with Judson Roch NP of Dr. Vaughan Proctor Please quit smoking.  This is the single most powerful action you can take to decrease your risk of lung cancer.  Please contact office for sooner follow up if symptoms do not improve or worsen or seek emergency care . I spent 25 minutes dedicated to the care of this patient on the date of this encounter to include pre-visit review of records, face-to-face time with the patient discussing conditions above, post visit ordering of testing, clinical documentation with the electronic health record, making appropriate referrals as documented, and communicating necessary information to the patient's healthcare team.   Magdalen Spatz, NP 10/28/2020  9:50 AM

## 2020-10-30 DIAGNOSIS — H2511 Age-related nuclear cataract, right eye: Secondary | ICD-10-CM | POA: Diagnosis not present

## 2020-10-30 DIAGNOSIS — H25811 Combined forms of age-related cataract, right eye: Secondary | ICD-10-CM | POA: Diagnosis not present

## 2020-11-06 ENCOUNTER — Ambulatory Visit: Payer: Medicare HMO

## 2020-11-06 ENCOUNTER — Other Ambulatory Visit: Payer: Self-pay

## 2020-11-06 DIAGNOSIS — I5042 Chronic combined systolic (congestive) and diastolic (congestive) heart failure: Secondary | ICD-10-CM | POA: Diagnosis not present

## 2020-11-11 NOTE — Progress Notes (Addendum)
Primary Physician/Referring:  Tamsen Roers, MD  Patient ID: Mario Proctor, male    DOB: 1940-06-29, 80 y.o.   MRN: [id number]  Chief Complaint  Patient presents with   Congestive Heart Failure   Follow-up   Results   HPI:    Mario Proctor  is a 80 y.o. Caucasian male patient  with nonischemic cardiomyopathy by coronary angiogram on 11/02/2016 revealing ejection fraction of 30-35%, ongoning tobacco use disorder, and permanent A fib. He is asymptomatic in regards to his A fib and is now on anticoagulation.  Echocardiogram 03/2020 revealed significantly decreased LVEF at 25%.  Patient was therefore started on amiodarone and underwent successful cardioversion 07/30/2020.  Patient presents for 42-monthfollow-up of heart failure, atrial fibrillation, and hypertension.  At last visit no changes were made.  Repeat echocardiogram revealed LVEF had marginally increased from 25-30% to 30-35% and PASP reduced from 44 to 20 mmHg, echocardiogram otherwise without significant change.  Overall patient is feeling well without specific complaints today.  He has not taken Lasix lately and is tolerating anticoagulation without bleeding diathesis.  Unfortunately he has not been monitoring his blood pressure regularly at home.  He admits to continuing to smoke half pack per day.  Denies chest pain, palpitations, dyspnea, leg swelling, orthopnea, PND.  He has had no recurrence of syncopal episodes.  However he does continue to have episodes of orthostatic dizziness.  He also admits to some dietary indiscretion with relatively high sodium intake.  Past Medical History:  Diagnosis Date   A-fib ([facility]    Allergy    CHF (congestive heart failure) (HCC)    Dupuytren contracture    right sm finger   Myocardial infarct ([facility]   Myocardial infarction ([facility]    Small bowel obstruction ([facility]    Past Surgical History:  Procedure Laterality Date   CARDIOVERSION N/A 07/30/2020   Procedure: CARDIOVERSION;  Surgeon:  GAdrian Prows MD;  Location: [facility]  Service: Cardiovascular;  Laterality: N/A;   COLON SURGERY     bowel blockage   DUPUYTREN CONTRACTURE RELEASE Left 2011   FASCIECTOMY Right 05/02/2015   Procedure: FASCIECTOMY RIGHT SMALL FINGER;  Surgeon: GDaryll Brod MD;  [facility]  Service: Orthopedics;  Laterality: Right;  axillary block in preop   LEFT HEART CATH AND CORONARY ANGIOGRAPHY N/A 11/03/2016   Procedure: Left Heart Cath and Coronary Angiography;  Surgeon: GAdrian Prows MD;  [facility];  Service: Cardiovascular;  Laterality: N/A;   TONSILLECTOMY     TYMPANOPLASTY Right    Family History  Problem Relation Age of Onset   Cancer Brother        "in heart and lungs"     Social History   Tobacco Use   Smoking status: Every Day    Packs/day: 0.50    Years: 50.00    Pack years: 25.00    Types: Cigarettes   Smokeless tobacco: Never   Tobacco comments:    currently smoking .5ppd as of 05/14/20  Substance Use Topics   Alcohol use: Yes    Alcohol/week: 6.0 standard drinks    Types: 6 Cans of beer per week    Comment: social   Marital Status: Married  ROS  Review of Systems  Constitutional: Negative for malaise/fatigue.  Cardiovascular:  Positive for dyspnea on exertion (chronic, stable). Negative for chest pain, claudication, leg swelling, near-syncope, orthopnea, palpitations, paroxysmal nocturnal dyspnea and syncope.  Respiratory:  Positive for cough (chronic, stable).  Gastrointestinal:  Negative for hematochezia and melena.  Neurological:  Positive for dizziness (postional).  Objective  Blood pressure (!) 146/72, pulse (!) 57, temperature 98.1 F (36.7 C), temperature source Temporal, height '5\' 11"'$  (1.803 m), weight 164 lb (74.4 kg), SpO2 95 %.  Vitals with BMI 11/12/2020 10/28/2020 09/16/2020  Height '5\' 11"'$  '5\' 11"'$  '5\' 11"'$   Weight 164 lbs 161 lbs 6 oz 166 lbs  BMI 22.88 [id number]  Systolic 123456 [id number]  Diastolic 72 80 65  Pulse 57 68  65     Physical Exam Vitals reviewed.  Constitutional:      Appearance: He is well-developed.  Neck:     Thyroid: No thyromegaly.     Vascular: No JVD.  Cardiovascular:     Rate and Rhythm: Regular rhythm. Bradycardia present.     Pulses: Intact distal pulses.          Carotid pulses are 2+ on the right side and 2+ on the left side.      Radial pulses are 2+ on the right side and 2+ on the left side.       Femoral pulses are 2+ on the right side and 2+ on the left side.      Popliteal pulses are 2+ on the right side and 2+ on the left side.       Dorsalis pedis pulses are 1+ on the right side and 1+ on the left side.       Posterior tibial pulses are 1+ on the right side and 1+ on the left side.     Heart sounds: Heart sounds are distant. No murmur heard.   No gallop.     Comments: No JVD.  Pulmonary:     Effort: Pulmonary effort is normal. No accessory muscle usage or respiratory distress.     Breath sounds: Rhonchi present. No wheezing.  Musculoskeletal:     Right lower leg: No edema.     Left lower leg: No edema.  Skin:    General: Skin is warm and dry.  Neurological:     Mental Status: He is alert.   Laboratory examination:   Recent Labs    06/12/20 0947 06/20/20 0950 07/04/20 0920 07/23/20 1052 08/29/20 2225 08/30/20 0243 08/31/20 0101  NA 131* 131* 132*   < > 135 136 133*  K 5.5* 5.0 5.2   < > 5.0 4.4 4.0  CL 95* 94* 93*   < > 95* 97* 98  CO2 '22 21 27   '$ < > '30 29 28  '$ GLUCOSE 102* 60* 80   < > 95 114* 99  BUN '23 21 21   '$ < > 45* 43* 30*  CREATININE 1.53* 1.49* 1.66*   < > 2.42* 2.08* 1.55*  CALCIUM 8.7 8.8 9.0   < > 8.9 8.7* 8.6*  GFRNONAA 43* 44* 39*  --  27* 32* 46*  GFRAA 50* 51* 45*  --   --   --   --    < > = values in this interval not displayed.   CrCl cannot be calculated (Patient's most recent lab result is older than the maximum 21 days allowed.).  CMP Latest Ref Rng & Units 08/31/2020 08/30/2020 08/29/2020  Glucose 70 - 99 mg/dL 99 114(H) 95  BUN  8 - 23 mg/dL 30(H) 43(H) 45(H)  Creatinine 0.61 - 1.24 mg/dL 1.55(H) 2.08(H) 2.42(H)  Sodium 135 - 145 mmol/L 133(L) 136 135  Potassium 3.5 - 5.1 mmol/L 4.0 4.4 5.0  Chloride 98 - 111 mmol/L 98 97(L) 95(L)  CO2 22 - 32 mmol/L '28 29 30  '$ Calcium 8.9 - 10.3 mg/dL 8.6(L) 8.7(L) 8.9  Total Protein 6.5 - 8.1 g/dL 6.2(L) - 7.0  Total Bilirubin 0.3 - 1.2 mg/dL 1.0 - 0.8  Alkaline Phos 38 - 126 U/L 86 - 91  AST 15 - 41 U/L 31 - 43(H)  ALT 0 - 44 U/L 27 - 36   CBC Latest Ref Rng & Units 08/31/2020 08/30/2020 08/29/2020  WBC 4.0 - 10.5 K/uL 8.7 9.9 8.5  Hemoglobin 13.0 - 17.0 g/dL 14.0 14.5 15.1  Hematocrit 39.0 - 52.0 % 41.8 43.6 45.8  Platelets 150 - 400 K/uL 212 231 232   Lipid Panel     Component Value Date/Time   CHOL 139 06/12/2020 0947   TRIG 61 06/12/2020 0947   HDL 66 06/12/2020 0947   LDLCALC 60 06/12/2020 0947   External labs: None Allergies  No Known Allergies    Medications Prior to Visit:   Outpatient Medications Prior to Visit  Medication Sig Dispense Refill   albuterol (ACCUNEB) 1.25 MG/3ML nebulizer solution Take 1 ampule by nebulization every 4 (four) hours as needed for wheezing or shortness of breath.     albuterol (VENTOLIN HFA) 108 (90 Base) MCG/ACT inhaler INHALE 2 PUFF BY MOUTH EVERY 6 HOURS AS NEEDED FOR WHEEZE OR SHORTNESS OF BREATH (Patient taking differently: Inhale 2 puffs into the lungs every 6 (six) hours as needed for shortness of breath or wheezing.) 18 each 5   albuterol (VENTOLIN HFA) 108 (90 Base) MCG/ACT inhaler Inhale 2 puffs into the lungs every 6 (six) hours as needed.     budesonide (PULMICORT) 0.5 MG/2ML nebulizer solution Take 0.5 mg by nebulization daily as needed (asthma).     diphenhydrAMINE (BENADRYL) 25 MG tablet Take 25 mg by mouth every 6 (six) hours as needed.     ENTRESTO 97-103 MG Take 1 tablet by mouth daily. 60 tablet 6   furosemide (LASIX) 20 MG tablet Take 10 mg by mouth 2 (two) times daily as needed.     rosuvastatin (CRESTOR) 5  MG tablet TAKE 1 TABLET BY MOUTH EVERY DAY 90 tablet 3   TRELEGY ELLIPTA 100-62.5-25 MCG/INH AEPB Inhale 1 puff into the lungs daily. 60 each 3   triamcinolone cream (KENALOG) 0.1 % Apply 1 application topically 2 (two) times daily.     XARELTO 20 MG TABS tablet TAKE 1 TABLET BY MOUTH EVERY DAY WITH SUPPER 90 tablet 3   amiodarone (PACERONE) 200 MG tablet TAKE 1 TABLET BY MOUTH TWICE A DAY 180 tablet 1   metoprolol succinate (TOPROL-XL) 25 MG 24 hr tablet Take 1 tablet (25 mg total) by mouth daily. 30 tablet 0   No facility-administered medications prior to visit.     Final Medications at End of Visit    Current Meds  Medication Sig   albuterol (ACCUNEB) 1.25 MG/3ML nebulizer solution Take 1 ampule by nebulization every 4 (four) hours as needed for wheezing or shortness of breath.   albuterol (VENTOLIN HFA) 108 (90 Base) MCG/ACT inhaler INHALE 2 PUFF BY MOUTH EVERY 6 HOURS AS NEEDED FOR WHEEZE OR SHORTNESS OF BREATH (Patient taking differently: Inhale 2 puffs into the lungs every 6 (six) hours as needed for shortness of breath or wheezing.)   albuterol (VENTOLIN HFA) 108 (90 Base) MCG/ACT inhaler Inhale 2 puffs into the lungs every 6 (six) hours as needed.   budesonide (PULMICORT) 0.5 MG/2ML nebulizer solution Take 0.5  mg by nebulization daily as needed (asthma).   dapagliflozin propanediol (FARXIGA) 10 MG TABS tablet Take 1 tablet (10 mg total) by mouth daily before breakfast.   diphenhydrAMINE (BENADRYL) 25 MG tablet Take 25 mg by mouth every 6 (six) hours as needed.   ENTRESTO 97-103 MG Take 1 tablet by mouth daily.   furosemide (LASIX) 20 MG tablet Take 10 mg by mouth 2 (two) times daily as needed.   rosuvastatin (CRESTOR) 5 MG tablet TAKE 1 TABLET BY MOUTH EVERY DAY   TRELEGY ELLIPTA 100-62.5-25 MCG/INH AEPB Inhale 1 puff into the lungs daily.   triamcinolone cream (KENALOG) 0.1 % Apply 1 application topically 2 (two) times daily.   XARELTO 20 MG TABS tablet TAKE 1 TABLET BY MOUTH  EVERY DAY WITH SUPPER   [DISCONTINUED] amiodarone (PACERONE) 200 MG tablet TAKE 1 TABLET BY MOUTH TWICE A DAY   Radiology:   Low-dose CT chest 08/16/2019: Coronary and aortic atherosclerosis and calcification.  Severe centrilobular emphysema.  2 benign looking nodules.  Benign right upper kidney mass.  Chest x-ray 08/29/2020: Cardiac shadow is within normal limits. Aortic calcifications are seen. The lungs are well aerated bilaterally. No bony abnormality is seen. IMPRESSION: No active disease.   CT head without contrast 08/29/2020: 1. Advanced chronic ischemic microangiopathy without acute intracranial abnormality. 2. No acute fracture or static subluxation of the cervical spine. 3. Small right frontal scalp hematoma.  Cardiac Studies:   Lexiscan myoview stress test 09/18/2016: 1. The resting electrocardiogram demonstrated normal sinus rhythm, normal resting conduction, no resting arrhythmias and normal rest repolarization.  Stress EKG is non-diagnostic for ischemia as it a pharmacologic stress using Lexiscan. Stress symptoms included dyspnea. Occasional PVC noted. 2. The LV is dilated both at rest and stress images. The LV end diastolic volume was 0000000. SPECT images demonstrate Medium perfusion abnormality of moderate intensity in the basal inferior, mid inferior and apical inferior myocardial wall(s) on the stress images.  The defect remains relatively unchanged between rest and stress images and is a soft tissue attenuation artifact, however scar in this region without ischemia cannot be completely excluded. The left ventricular ejection fraction was calculated or visually estimated to be 25% with global hypokinesis. High risk study.  Abdominal aortic duplex 10/06/2016: Diffuse plaque noted in the proximal, mid and distal aorta.  No AAA observed.  Coronary angiogram 11/03/2016: Severe left ventricular systolic dysfunction. The left ventricular ejection fraction is 30-35% by visual estimate  with inferior wall akinesis.There is no mitral valve regurgitation. Normal LVEDP. Normal coronary arteries.  Holter Monitor 48 hours 09/07/2017: Minimum heart rate 38 bpm at 12:13 AM maximum heart rate 154 bpm at 12:59 PM. PVCs consisted of 3600 beats, 1.5% burden. Occasional ventricular and triplets, 194 couplets.. Bigeminy and trigeminy. 2 episodes of 4 beat 3 beat NSVT. Predominant rhythm was atrial fibrillation.   Direct current cardioversion 07/30/20 : Indication symptomatic A. Fibrillation. Procedure: Using 80 mg of IV Propofol and 60 IV Lidocaine (for reducing venous pain) for achieving deep sedation, synchronized direct current cardioversion performed. Patient was delivered with 150 Joules of electricity X 1 with success to NSR. Patient tolerated the procedure well. No immediate complication noted.   PCV ECHOCARDIOGRAM COMPLETE 11/06/2020 Left ventricle cavity is normal in size. Mild concentric hypertrophy of the left ventricle. Severe global hypokinesis. LVEF 30-35%. Doppler evidence of grade I (impaired) diastolic dysfunction, normal LAP. Left atrial cavity is severely dilated. Mild (Grade I) mitral regurgitation. Mild tricuspid regurgitation. Compared to previous study on 03/15/2020, LVEF marginally  increased from 25-30%. Estimated PASP reduced from 44 to 20 mmHg.  EKG:   EKG 08/13/2020: Sinus bradycardia at a rate of 58 bpm.  Left axis, left anterior fascicular block.  Poor R wave progression, cannot exclude anteroseptal infarct old.  LVH.  Compared to EKG 05/21/2020, now in sinus rhythm.  EKG 05/21/2020: Atrial fibrillation with controlled ventricular response at a rate of 58 bpm.  Left axis, left anterior fascicular block.  LVH. Poor R wave progression, cannot exclude anteroseptal infarct old.Compared to EKG 03/07/2020, no ectopic ventricular beats.  EKG 03/07/2020: Atrial fibrillation with controlled ventricular response at a rate of 61 beats per with ectopic ventricular beats (2).   Left axis deviation, left anterior fascicular block.  Left ventricular hypertrophy. Poor R wave progression, cannot exclude anterior septal infarct old.  Compared to EKG 09/07/2019, no significant change  Assessment     ICD-10-CM   1. Chronic combined systolic and diastolic CHF (congestive heart failure) (HCC)  I50.42     2. Paroxysmal atrial fibrillation (HCC)  I48.0     3. Permanent atrial fibrillation (HCC)  I48.21 amiodarone (PACERONE) 200 MG tablet    4. Nicotine dependence, cigarettes, uncomplicated  [id number]       Meds ordered this encounter  Medications   amiodarone (PACERONE) 200 MG tablet    Sig: Take 1 tablet (200 mg total) by mouth daily.    Dispense:  180 tablet    Refill:  1   dapagliflozin propanediol (FARXIGA) 10 MG TABS tablet    Sig: Take 1 tablet (10 mg total) by mouth daily before breakfast.    Dispense:  30 tablet    Refill:  3    Medications Discontinued During This Encounter  Medication Reason   amiodarone (PACERONE) 200 MG tablet   This patients CHA2DS2-VASc Score 3 (CHF, A) and yearly risk of stroke 3.2%.    Recommendations:   Mario Proctor  is a 80 y.o.  Caucasian male patient  with nonischemic cardiomyopathy by coronary angiogram on 11/02/2016 revealing ejection fraction of 30-35%, ongoning tobacco use disorder, and permanent A fib. He is asymptomatic in regards to his A fib and is now on anticoagulation.  Echocardiogram 03/2020 revealed significantly decreased LVEF at 25%.  Patient was therefore started on amiodarone and underwent successful cardioversion 07/30/2020.  Patient presents for 70-monthfollow-up of heart failure, atrial fibrillation, and hypertension.  At last visit no changes were made.  Repeat echocardiogram revealed LVEF had marginally increased from 25-30% to 30-35% and PASP reduced from 44 to 20 mmHg, echocardiogram otherwise without significant change.  Patient is feeling well without specific complaints and there is no clinical evidence of  heart failure at this time.  He is tolerating anticoagulation without bleeding diathesis, given CHA2DS2-VASc score of 3 will continue anticoagulation.  In regard to cardiomyopathy, will avoid addition of spironolactone given worsening renal dysfunction when he was on this previously.  We will instead add Farxiga 10 mg once daily.  In regard to elevated blood pressure, patient has not taken his Entresto or metoprolol today, and states he typically takes it around the time of his appointment.  Suspect blood pressures are under better control on a regular basis at home.  Advised patient to further reduce salt intake as well as monitor his blood pressure on a daily basis and notify our office if it remains >140/80 mmHg, he verbalized understanding agreement.  Notably patient has mistakenly been taking amiodarone 200 mg twice daily, will therefore reduce this to 200 mg  once daily. As patient is on amiodarone, he will need annual monitoring of PFTs, liver function, and thyroid function.  4 minutes of today's visit was spent counseling patient regarding tobacco cessation.  Follow-up in 3 months, sooner if needed, for heart failure, atrial fibrillation, and elevated blood pressure.   Alethia Berthold, PA-C 11/12/2020, 9:11 AM Office: [phone]

## 2020-11-12 ENCOUNTER — Other Ambulatory Visit: Payer: Self-pay

## 2020-11-12 ENCOUNTER — Encounter: Payer: Self-pay | Admitting: Student

## 2020-11-12 ENCOUNTER — Ambulatory Visit: Payer: Medicare HMO | Admitting: Student

## 2020-11-12 VITALS — BP 146/72 | HR 57 | Temp 98.1°F | Ht 71.0 in | Wt 164.0 lb

## 2020-11-12 DIAGNOSIS — F17210 Nicotine dependence, cigarettes, uncomplicated: Secondary | ICD-10-CM | POA: Diagnosis not present

## 2020-11-12 DIAGNOSIS — I4821 Permanent atrial fibrillation: Secondary | ICD-10-CM

## 2020-11-12 DIAGNOSIS — I5042 Chronic combined systolic (congestive) and diastolic (congestive) heart failure: Secondary | ICD-10-CM

## 2020-11-12 DIAGNOSIS — I480 Paroxysmal atrial fibrillation: Secondary | ICD-10-CM

## 2020-11-12 DIAGNOSIS — R69 Illness, unspecified: Secondary | ICD-10-CM | POA: Diagnosis not present

## 2020-11-12 MED ORDER — AMIODARONE HCL 200 MG PO TABS
200.0000 mg | ORAL_TABLET | Freq: Every day | ORAL | 1 refills | Status: DC
Start: 2020-11-12 — End: 2021-02-12

## 2020-11-12 MED ORDER — DAPAGLIFLOZIN PROPANEDIOL 10 MG PO TABS: 10.0000 mg | ORAL_TABLET | Freq: Every day | ORAL | 3 refills | Status: DC

## 2020-11-19 ENCOUNTER — Other Ambulatory Visit: Payer: Self-pay | Admitting: Student

## 2021-01-20 ENCOUNTER — Other Ambulatory Visit: Payer: Self-pay | Admitting: Student

## 2021-01-27 ENCOUNTER — Other Ambulatory Visit: Payer: Self-pay | Admitting: Pulmonary Disease

## 2021-02-11 NOTE — Progress Notes (Signed)
Primary Physician/Referring:  Tamsen Roers, MD  Patient ID: Mario Proctor, male    DOB: [birth year], 80 y.o.   MRN: [id number]  No chief complaint on file.  HPI:    Mario Proctor  is a 80 y.o. Caucasian male patient  with nonischemic cardiomyopathy by coronary angiogram on 11/02/2016 revealing ejection fraction of 30-35%, ongoning tobacco use disorder, and permanent A fib. He is asymptomatic in regards to his A fib and is now on anticoagulation.  Echocardiogram 03/2020 revealed significantly decreased LVEF at 25%.  Patient was therefore started on amiodarone and underwent successful cardioversion 07/30/2020.  Patient presents for 36-monthfollow-up of heart failure, atrial fibrillation, blood pressure management.  Last office visit added Farxiga and reduced amiodarone to 200 mg once daily.  Patient has had no known recurrence of atrial fibrillation.  Denies chest pain, palpitations, dyspnea, leg swelling, orthopnea, PND.  Past Medical History:  Diagnosis Date   A-fib ([facility]    Allergy    CHF (congestive heart failure) (HCC)    Dupuytren contracture    right sm finger   Myocardial infarct ([facility] 2019   Myocardial infarction ([facility]    Small bowel obstruction ([facility]    Past Surgical History:  Procedure Laterality Date   CARDIOVERSION N/A 07/30/2020   Procedure: CARDIOVERSION;  Surgeon: GAdrian Prows MD;  [place] [facility]  Service: Cardiovascular;  Laterality: N/A;   COLON SURGERY     bowel blockage   DUPUYTREN CONTRACTURE RELEASE Left 2011   FASCIECTOMY Right 05/02/2015   Procedure: FASCIECTOMY RIGHT SMALL FINGER;  Surgeon: GDaryll Brod MD;  [place] [facility]  Service: Orthopedics;  Laterality: Right;  axillary block in preop   LEFT HEART CATH AND CORONARY ANGIOGRAPHY N/A 11/03/2016   Procedure: Left Heart Cath and Coronary Angiography;  Surgeon: GAdrian Prows MD;  [facility];  Service: Cardiovascular;  Laterality: N/A;   TONSILLECTOMY      TYMPANOPLASTY Right    Family History  Problem Relation Age of Onset   Cancer Brother        "in heart and lungs"     Social History   Tobacco Use   Smoking status: Every Day    Packs/day: 0.50    Years: 50.00    Pack years: 25.00    Types: Cigarettes   Smokeless tobacco: Never   Tobacco comments:    currently smoking .5ppd as of 05/14/20  Substance Use Topics   Alcohol use: Yes    Alcohol/week: 6.0 standard drinks    Types: 6 Cans of beer per week    Comment: social   Marital Status: Married  ROS  Review of Systems  Cardiovascular:  Positive for dyspnea on exertion (chronic, stable). Negative for chest pain, claudication, leg swelling, near-syncope, orthopnea, palpitations, paroxysmal nocturnal dyspnea and syncope.  Respiratory:  Positive for cough (chronic, stable).   Neurological:  Positive for dizziness (postional).  Objective  There were no vitals taken for this visit.  Vitals with BMI 11/12/2020 10/28/2020 09/16/2020  Height '5\' 11"'$  '5\' 11"'$  '5\' 11"'$   Weight 164 lbs 161 lbs 6 oz 166 lbs  BMI 22.88 [id number] Systolic [id number] Diastolic 72 80 65  Pulse 57 68 65     Physical Exam Vitals reviewed.  Constitutional:      Appearance: He is well-developed.  Neck:     Thyroid: No thyromegaly.     Vascular: No JVD.  Cardiovascular:     Rate and Rhythm: Normal  rate and regular rhythm.     Pulses: Intact distal pulses.          Carotid pulses are 2+ on the right side and 2+ on the left side.      Radial pulses are 2+ on the right side and 2+ on the left side.       Femoral pulses are 2+ on the right side and 2+ on the left side.      Popliteal pulses are 2+ on the right side and 2+ on the left side.       Dorsalis pedis pulses are 1+ on the right side and 1+ on the left side.       Posterior tibial pulses are 1+ on the right side and 1+ on the left side.     Heart sounds: Heart sounds are distant. No murmur heard.   No gallop.     Comments: No JVD.  Pulmonary:      Effort: Pulmonary effort is normal. No accessory muscle usage or respiratory distress.     Breath sounds: Rhonchi present. No wheezing.  Musculoskeletal:     Right lower leg: No edema.     Left lower leg: No edema.  Skin:    General: Skin is warm and dry.  Neurological:     Mental Status: He is alert.   Laboratory examination:   Recent Labs    06/12/20 0947 06/20/20 0950 07/04/20 0920 07/23/20 1052 08/29/20 2225 08/30/20 0243 08/31/20 0101  NA 131* 131* 132*   < > 135 136 133*  K 5.5* 5.0 5.2   < > 5.0 4.4 4.0  CL 95* 94* 93*   < > 95* 97* 98  CO2 '22 21 27   '$ < > '30 29 28  '$ GLUCOSE 102* 60* 80   < > 95 114* 99  BUN '23 21 21   '$ < > 45* 43* 30*  CREATININE 1.53* 1.49* 1.66*   < > 2.42* 2.08* 1.55*  CALCIUM 8.7 8.8 9.0   < > 8.9 8.7* 8.6*  GFRNONAA 43* 44* 39*  --  27* 32* 46*  GFRAA 50* 51* 45*  --   --   --   --    < > = values in this interval not displayed.    CrCl cannot be calculated (Patient's most recent lab result is older than the maximum 21 days allowed.).  CMP Latest Ref Rng & Units 08/31/2020 08/30/2020 08/29/2020  Glucose 70 - 99 mg/dL 99 114(H) 95  BUN 8 - 23 mg/dL 30(H) 43(H) 45(H)  Creatinine 0.61 - 1.24 mg/dL 1.55(H) 2.08(H) 2.42(H)  Sodium 135 - 145 mmol/L 133(L) 136 135  Potassium 3.5 - 5.1 mmol/L 4.0 4.4 5.0  Chloride 98 - 111 mmol/L 98 97(L) 95(L)  CO2 22 - 32 mmol/L '28 29 30  '$ Calcium 8.9 - 10.3 mg/dL 8.6(L) 8.7(L) 8.9  Total Protein 6.5 - 8.1 g/dL 6.2(L) - 7.0  Total Bilirubin 0.3 - 1.2 mg/dL 1.0 - 0.8  Alkaline Phos 38 - 126 U/L 86 - 91  AST 15 - 41 U/L 31 - 43(H)  ALT 0 - 44 U/L 27 - 36   CBC Latest Ref Rng & Units 08/31/2020 08/30/2020 08/29/2020  WBC 4.0 - 10.5 K/uL 8.7 9.9 8.5  Hemoglobin 13.0 - 17.0 g/dL 14.0 14.5 15.1  Hematocrit 39.0 - 52.0 % 41.8 43.6 45.8  Platelets 150 - 400 K/uL 212 231 232   Lipid Panel     Component Value Date/Time  CHOL 139 06/12/2020 0947   TRIG 61 06/12/2020 0947   HDL 66 06/12/2020 0947   LDLCALC 60  06/12/2020 0947   External labs: None Allergies  No Known Allergies    Medications Prior to Visit:   Outpatient Medications Prior to Visit  Medication Sig Dispense Refill   TRELEGY ELLIPTA 100-62.5-25 MCG/INH AEPB TAKE 1 PUFF BY MOUTH EVERY DAY 60 each 3   albuterol (ACCUNEB) 1.25 MG/3ML nebulizer solution Take 1 ampule by nebulization every 4 (four) hours as needed for wheezing or shortness of breath.     albuterol (VENTOLIN HFA) 108 (90 Base) MCG/ACT inhaler INHALE 2 PUFF BY MOUTH EVERY 6 HOURS AS NEEDED FOR WHEEZE OR SHORTNESS OF BREATH (Patient taking differently: Inhale 2 puffs into the lungs every 6 (six) hours as needed for shortness of breath or wheezing.) 18 each 5   albuterol (VENTOLIN HFA) 108 (90 Base) MCG/ACT inhaler Inhale 2 puffs into the lungs every 6 (six) hours as needed.     amiodarone (PACERONE) 200 MG tablet Take 1 tablet (200 mg total) by mouth daily. 180 tablet 1   budesonide (PULMICORT) 0.5 MG/2ML nebulizer solution Take 0.5 mg by nebulization daily as needed (asthma).     dapagliflozin propanediol (FARXIGA) 10 MG TABS tablet Take 1 tablet (10 mg total) by mouth daily before breakfast. 30 tablet 3   diphenhydrAMINE (BENADRYL) 25 MG tablet Take 25 mg by mouth every 6 (six) hours as needed.     ENTRESTO 97-103 MG Take 1 tablet by mouth daily. 60 tablet 6   furosemide (LASIX) 20 MG tablet TAKE 1 TABLET BY MOUTH 2 (TWO) TIMES DAILY AS NEEDED FOR FLUID (SWELLING, SHORTNESS OF BREATH). 180 tablet 1   metoprolol succinate (TOPROL-XL) 25 MG 24 hr tablet Take 1 tablet (25 mg total) by mouth daily. 30 tablet 0   rosuvastatin (CRESTOR) 5 MG tablet TAKE 1 TABLET BY MOUTH EVERY DAY 90 tablet 3   triamcinolone cream (KENALOG) 0.1 % Apply 1 application topically 2 (two) times daily.     XARELTO 20 MG TABS tablet TAKE 1 TABLET BY MOUTH EVERY DAY WITH SUPPER 90 tablet 3   No facility-administered medications prior to visit.     Final Medications at End of Visit    No  outpatient medications have been marked as taking for the 02/12/21 encounter (Appointment) with Rayetta Pigg, [place] Sun C, PA-C.   Radiology:   Low-dose CT chest 08/16/2019: Coronary and aortic atherosclerosis and calcification.  Severe centrilobular emphysema.  2 benign looking nodules.  Benign right upper kidney mass.  Chest x-ray 08/29/2020: Cardiac shadow is within normal limits. Aortic calcifications are seen. The lungs are well aerated bilaterally. No bony abnormality is seen. IMPRESSION: No active disease.   CT head without contrast 08/29/2020: 1. Advanced chronic ischemic microangiopathy without acute intracranial abnormality. 2. No acute fracture or static subluxation of the cervical spine. 3. Small right frontal scalp hematoma.  Cardiac Studies:   Lexiscan myoview stress test 09/18/2016: 1. The resting electrocardiogram demonstrated normal sinus rhythm, normal resting conduction, no resting arrhythmias and normal rest repolarization.  Stress EKG is non-diagnostic for ischemia as it a pharmacologic stress using Lexiscan. Stress symptoms included dyspnea. Occasional PVC noted. 2. The LV is dilated both at rest and stress images. The LV end diastolic volume was 0000000. SPECT images demonstrate Medium perfusion abnormality of moderate intensity in the basal inferior, mid inferior and apical inferior myocardial wall(s) on the stress images.  The defect remains relatively unchanged between rest and stress  images and is a soft tissue attenuation artifact, however scar in this region without ischemia cannot be completely excluded. The left ventricular ejection fraction was calculated or visually estimated to be 25% with global hypokinesis. High risk study.  Abdominal aortic duplex 10/06/2016: Diffuse plaque noted in the proximal, mid and distal aorta.  No AAA observed.  Coronary angiogram 11/03/2016: Severe left ventricular systolic dysfunction. The left ventricular ejection fraction is 30-35% by visual  estimate with inferior wall akinesis.There is no mitral valve regurgitation. Normal LVEDP. Normal coronary arteries.  Holter Monitor 48 hours 09/07/2017: Minimum heart rate 38 bpm at 12:13 AM maximum heart rate 154 bpm at 12:59 PM. PVCs consisted of 3600 beats, 1.5% burden. Occasional ventricular and triplets, 194 couplets.. Bigeminy and trigeminy. 2 episodes of 4 beat 3 beat NSVT. Predominant rhythm was atrial fibrillation.   Direct current cardioversion 07/30/20 : Indication symptomatic A. Fibrillation. Procedure: Using 80 mg of IV Propofol and 60 IV Lidocaine (for reducing venous pain) for achieving deep sedation, synchronized direct current cardioversion performed. Patient was delivered with 150 Joules of electricity X 1 with success to NSR. Patient tolerated the procedure well. No immediate complication noted.   PCV ECHOCARDIOGRAM COMPLETE 11/06/2020 Left ventricle cavity is normal in size. Mild concentric hypertrophy of the left ventricle. Severe global hypokinesis. LVEF 30-35%. Doppler evidence of grade I (impaired) diastolic dysfunction, normal LAP. Left atrial cavity is severely dilated. Mild (Grade I) mitral regurgitation. Mild tricuspid regurgitation. Compared to previous study on 03/15/2020, LVEF marginally increased from 25-30%. Estimated PASP reduced from 44 to 20 mmHg.  EKG:  02/12/2021: Sinus bradycardia with first-degree AV block at a rate of 58 bpm.  Left axis, left anterior fascicular block.  Poor R wave progression, cannot exclude anteroseptal infarct old.  Compared to EKG 08/13/2020, no significant change.  EKG 08/13/2020: Sinus bradycardia at a rate of 58 bpm.  Left axis, left anterior fascicular block.  Poor R wave progression, cannot exclude anteroseptal infarct old.  LVH.  Compared to EKG 05/21/2020, now in sinus rhythm.  EKG 05/21/2020: Atrial fibrillation with controlled ventricular response at a rate of 58 bpm.  Left axis, left anterior fascicular block.  LVH. Poor R wave  progression, cannot exclude anteroseptal infarct old.Compared to EKG 03/07/2020, no ectopic ventricular beats.  EKG 03/07/2020: Atrial fibrillation with controlled ventricular response at a rate of 61 beats per with ectopic ventricular beats (2).  Left axis deviation, left anterior fascicular block.  Left ventricular hypertrophy. Poor R wave progression, cannot exclude anterior septal infarct old.  Compared to EKG 09/07/2019, no significant change  Assessment     ICD-10-CM   1. Paroxysmal atrial fibrillation (HCC)  I48.0 EKG 12-Lead    2. Chronic combined systolic and diastolic CHF (congestive heart failure) (HCC)  I50.42     3. Nicotine dependence, cigarettes, uncomplicated  [id number]     4. Permanent atrial fibrillation (HCC)  I48.21 amiodarone (PACERONE) 100 MG tablet      No diagnosis found.   No orders of the defined types were placed in this encounter.   There are no discontinued medications. This patients CHA2DS2-VASc Score 3 (CHF, A) and yearly risk of stroke 3.2%.    Recommendations:   ARVID DEGROOT  is a 80 y.o.  Caucasian male patient  with nonischemic cardiomyopathy by coronary angiogram on 11/02/2016 revealing ejection fraction of 30-35%, ongoning tobacco use disorder, and permanent A fib. He is asymptomatic in regards to his A fib and is now on anticoagulation.  Echocardiogram 03/2020 revealed  significantly decreased LVEF at 25%.  Patient was therefore started on amiodarone and underwent successful cardioversion 07/30/2020.  Patient presents for 21-monthfollow-up of heart failure, atrial fibrillation, blood pressure management.  Last office visit added Farxiga and reduced amiodarone to 200 mg once daily.  Patient's physical exam is unchanged and there is no clinical evidence of acute heart failure.  He is presently maintaining sinus rhythm.  We will reduce amiodarone to 100 mg once daily.  Patient's blood pressure is uncontrolled, will add BiDil 1 tablet p.o. 3 times daily.   Patient will continue to monitor blood pressure Regularly at home and notify our office if it remains >130/80 mmHg.  In regard to cardiomyopathy, will avoid addition of spironolactone given worsening renal dysfunction when he was on this previously.  We will continue FLisabeth Register metoprolol.  He is tolerating Xarelto without bleeding diathesis, will continue anticoagulation.  As patient is on amiodarone, he will need annual monitoring of PFTs, liver function, and thyroid function.  Follow-up in 6 weeks, sooner if needed, for hypertension and dizziness.   CAlethia Berthold PA-C 02/15/2021, 9:27 AM Office: [phone]

## 2021-02-12 ENCOUNTER — Other Ambulatory Visit: Payer: Self-pay

## 2021-02-12 ENCOUNTER — Ambulatory Visit: Payer: Medicare HMO | Admitting: Student

## 2021-02-12 ENCOUNTER — Encounter: Payer: Self-pay | Admitting: Student

## 2021-02-12 VITALS — BP 154/82 | HR 60 | Temp 98.0°F | Ht 71.0 in | Wt 163.0 lb

## 2021-02-12 DIAGNOSIS — I480 Paroxysmal atrial fibrillation: Secondary | ICD-10-CM

## 2021-02-12 DIAGNOSIS — I4821 Permanent atrial fibrillation: Secondary | ICD-10-CM | POA: Diagnosis not present

## 2021-02-12 DIAGNOSIS — R69 Illness, unspecified: Secondary | ICD-10-CM | POA: Diagnosis not present

## 2021-02-12 DIAGNOSIS — F17210 Nicotine dependence, cigarettes, uncomplicated: Secondary | ICD-10-CM

## 2021-02-12 DIAGNOSIS — I5042 Chronic combined systolic (congestive) and diastolic (congestive) heart failure: Secondary | ICD-10-CM | POA: Diagnosis not present

## 2021-02-12 MED ORDER — ISOSORB DINITRATE-HYDRALAZINE 20-37.5 MG PO TABS: 1.0000 | ORAL_TABLET | Freq: Three times a day (TID) | ORAL | 3 refills | Status: DC

## 2021-02-12 MED ORDER — AMIODARONE HCL 100 MG PO TABS: 100.0000 mg | ORAL_TABLET | Freq: Every day | ORAL | 3 refills | Status: DC

## 2021-02-12 NOTE — Patient Instructions (Signed)
Reduce amiodarone from 200 mg to 100 mg once daily, you can cut in half until you run out  Start taking Bidil 1 tablet 3 times daily.  Drink 2 L water per day  Get up slowly from seated position and pump feet before standing.

## 2021-02-19 ENCOUNTER — Other Ambulatory Visit: Payer: Self-pay | Admitting: Primary Care

## 2021-03-10 ENCOUNTER — Other Ambulatory Visit: Payer: Self-pay | Admitting: Student

## 2021-03-10 ENCOUNTER — Other Ambulatory Visit: Payer: Self-pay | Admitting: Cardiology

## 2021-03-10 DIAGNOSIS — I4821 Permanent atrial fibrillation: Secondary | ICD-10-CM

## 2021-03-11 ENCOUNTER — Other Ambulatory Visit: Payer: Self-pay

## 2021-03-11 MED ORDER — DAPAGLIFLOZIN PROPANEDIOL 10 MG PO TABS: 10.0000 mg | ORAL_TABLET | Freq: Every day | ORAL | 3 refills | Status: DC

## 2021-03-25 NOTE — Progress Notes (Signed)
Primary Physician/Referring:  Tamsen Roers, MD  Patient ID: Mario Proctor, male    DOB: Jul 21, 1940, 80 y.o.   MRN: [id number]  Chief Complaint  Patient presents with   Atrial Fibrillation   Hypertension   Dizziness   Follow-up    6 weeks   HPI:    Mario Proctor  is a 80 y.o. Caucasian male patient  with nonischemic cardiomyopathy by coronary angiogram on 11/02/2016 revealing ejection fraction of 30-35%, ongoning tobacco use disorder, and permanent A fib. He is asymptomatic in regards to his A fib and is now on anticoagulation.  Echocardiogram 03/2020 revealed significantly decreased LVEF at 25%.  Patient was therefore started on amiodarone and underwent successful cardioversion 07/30/2020.  Patient presents for 6-week follow-up of hypertension and dizziness.  Last office visit reduced amiodarone to 100 mg once daily, added BiDil 1 tablet 3 times daily, and counseled him regarding hydration and conservative measures for positional dizziness.  Patient reports he tried taking BiDil but this made his dizziness significantly worse.  He noticed his blood pressure was quite low although he does not remember specific numbers while taking BiDil.  He discontinued BiDil on his own at home about a week ago and has been feeling well since then.  He has had no recurrence of dizziness.  He reports home blood pressure readings average 130/70 mmHg, with occasional spikes to 562Z systolic.  Patient has had no known recurrence of atrial fibrillation.  Denies chest pain, palpitations, dyspnea, leg swelling, orthopnea, PND.  He continues to tolerate anticoagulation without bleeding diathesis.  Past Medical History:  Diagnosis Date   A-fib [facility])    Allergy    CHF (congestive heart failure) (HCC)    Dupuytren contracture    right sm finger   Myocardial infarct ([place]) 2019   Myocardial infarction ([place])    Small bowel obstruction [facility])    Past Surgical History:  Procedure Laterality Date   CARDIOVERSION  N/A 07/30/2020   Procedure: CARDIOVERSION;  Surgeon: [name], MD;  Location: [place];  Service: Cardiovascular;  Laterality: N/A;   COLON SURGERY     bowel blockage   DUPUYTREN CONTRACTURE RELEASE Left 2011   FASCIECTOMY Right 05/02/2015   Procedure: FASCIECTOMY RIGHT SMALL FINGER;  Surgeon: Daryll Brod, MD;  Location: [place];  Service: Orthopedics;  Laterality: Right;  axillary block in preop   LEFT HEART CATH AND CORONARY ANGIOGRAPHY N/A 11/03/2016   Procedure: Left Heart Cath and Coronary Angiography;  Surgeon: [name], MD;  Location: [facility];  Service: Cardiovascular;  Laterality: N/A;   TONSILLECTOMY     TYMPANOPLASTY Right    Family History  Problem Relation Age of Onset   Cancer Brother        "in heart and lungs"     Social History   Tobacco Use   Smoking status: Every Day    Packs/day: 0.50    Years: 50.00    Pack years: 25.00    Types: Cigarettes   Smokeless tobacco: Never   Tobacco comments:    currently smoking .5ppd as of 05/14/20  Substance Use Topics   Alcohol use: Yes    Alcohol/week: 6.0 standard drinks    Types: 6 Cans of beer per week    Comment: social   Marital Status: Married  ROS  Review of Systems  Cardiovascular:  Positive for dyspnea on exertion (chronic, stable). Negative for chest pain, claudication, leg swelling, near-syncope, orthopnea, palpitations, paroxysmal nocturnal dyspnea and syncope.  Respiratory:  Positive for cough (chronic, stable).   Neurological:  Negative for dizziness (resolved).  Objective  Blood pressure (!) 144/73, pulse 79, temperature 98 F (36.7 C), resp. rate 16, height 5\' 11"  (1.803 m), weight 160 lb (72.6 kg), SpO2 95 %.  Vitals with BMI 03/26/2021 02/12/2021 02/12/2021  Height 5\' 11"  - 5\' 11"   Weight 160 lbs - 163 lbs  BMI 07.12 - 19.75  Systolic 883 254 982  Diastolic 73 82 82  Pulse 79 60 59     Physical Exam Vitals reviewed.  Constitutional:      Appearance: He is  well-developed.  Neck:     Thyroid: No thyromegaly.     Vascular: No JVD.  Cardiovascular:     Rate and Rhythm: Normal rate and regular rhythm.     Pulses: Intact distal pulses.          Carotid pulses are 2+ on the right side and 2+ on the left side.      Radial pulses are 2+ on the right side and 2+ on the left side.       Femoral pulses are 2+ on the right side and 2+ on the left side.      Popliteal pulses are 2+ on the right side and 2+ on the left side.       Dorsalis pedis pulses are 1+ on the right side and 1+ on the left side.       Posterior tibial pulses are 1+ on the right side and 1+ on the left side.     Heart sounds: Heart sounds are distant. No murmur heard.   No gallop.     Comments: No JVD.  Pulmonary:     Effort: Pulmonary effort is normal. No accessory muscle usage or respiratory distress.     Breath sounds: Rhonchi present. No wheezing.  Musculoskeletal:     Right lower leg: No edema.     Left lower leg: No edema.   Laboratory examination:   Recent Labs    06/12/20 0947 06/20/20 0950 07/04/20 0920 07/23/20 1052 08/29/20 2225 08/30/20 0243 08/31/20 0101  NA 131* 131* 132*   < > 135 136 133*  K 5.5* 5.0 5.2   < > 5.0 4.4 4.0  CL 95* 94* 93*   < > 95* 97* 98  CO2 22 21 27    < > 30 29 28   GLUCOSE 102* 60* 80   < > 95 114* 99  BUN 23 21 21    < > 45* 43* 30*  CREATININE 1.53* 1.49* 1.66*   < > 2.42* 2.08* 1.55*  CALCIUM 8.7 8.8 9.0   < > 8.9 8.7* 8.6*  GFRNONAA 43* 44* 39*  --  27* 32* 46*  GFRAA 50* 51* 45*  --   --   --   --    < > = values in this interval not displayed.   CrCl cannot be calculated (Patient's most recent lab result is older than the maximum 21 days allowed.).  CMP Latest Ref Rng & Units 08/31/2020 08/30/2020 08/29/2020  Glucose 70 - 99 mg/dL 99 114(H) 95  BUN 8 - 23 mg/dL 30(H) 43(H) 45(H)  Creatinine 0.61 - 1.24 mg/dL 1.55(H) 2.08(H) 2.42(H)  Sodium 135 - 145 mmol/L 133(L) 136 135  Potassium 3.5 - 5.1 mmol/L 4.0 4.4 5.0  Chloride  98 - 111 mmol/L 98 97(L) 95(L)  CO2 22 - 32 mmol/L 28 29 30   Calcium 8.9 - 10.3 mg/dL 8.6(L) 8.7(L)  8.9  Total Protein 6.5 - 8.1 g/dL 6.2(L) - 7.0  Total Bilirubin 0.3 - 1.2 mg/dL 1.0 - 0.8  Alkaline Phos 38 - 126 U/L 86 - 91  AST 15 - 41 U/L 31 - 43(H)  ALT 0 - 44 U/L 27 - 36   CBC Latest Ref Rng & Units 08/31/2020 08/30/2020 08/29/2020  WBC 4.0 - 10.5 K/uL 8.7 9.9 8.5  Hemoglobin 13.0 - 17.0 g/dL 14.0 14.5 15.1  Hematocrit 39.0 - 52.0 % 41.8 43.6 45.8  Platelets 150 - 400 K/uL 212 231 232   Lipid Panel     Component Value Date/Time   CHOL 139 06/12/2020 0947   TRIG 61 06/12/2020 0947   HDL 66 06/12/2020 0947   LDLCALC 60 06/12/2020 0947   External labs: None  Allergies  No Known Allergies    Medications Prior to Visit:   Outpatient Medications Prior to Visit  Medication Sig Dispense Refill   albuterol (ACCUNEB) 1.25 MG/3ML nebulizer solution Take 1 ampule by nebulization every 4 (four) hours as needed for wheezing or shortness of breath.     albuterol (VENTOLIN HFA) 108 (90 Base) MCG/ACT inhaler Inhale 2 puffs into the lungs every 6 (six) hours as needed.     albuterol (VENTOLIN HFA) 108 (90 Base) MCG/ACT inhaler INHALE 2 PUFF BY MOUTH EVERY 6 HOURS AS NEEDED FOR WHEEZE OR SHORTNESS OF BREATH 18 each 5   amiodarone (PACERONE) 100 MG tablet Take 1 tablet (100 mg total) by mouth daily. 90 tablet 3   budesonide (PULMICORT) 0.5 MG/2ML nebulizer solution Take 0.5 mg by nebulization daily as needed (asthma).     dapagliflozin propanediol (FARXIGA) 10 MG TABS tablet Take 1 tablet (10 mg total) by mouth daily before breakfast. 30 tablet 3   diphenhydrAMINE (BENADRYL) 25 MG tablet Take 25 mg by mouth every 6 (six) hours as needed.     ENTRESTO 97-103 MG Take 1 tablet by mouth daily. 60 tablet 6   rosuvastatin (CRESTOR) 5 MG tablet TAKE 1 TABLET BY MOUTH EVERY DAY 90 tablet 3   TRELEGY ELLIPTA 100-62.5-25 MCG/INH AEPB TAKE 1 PUFF BY MOUTH EVERY DAY 60 each 3   triamcinolone cream  (KENALOG) 0.1 % Apply 1 application topically 2 (two) times daily.     XARELTO 20 MG TABS tablet TAKE 1 TABLET BY MOUTH EVERY DAY WITH SUPPER 90 tablet 3   furosemide (LASIX) 20 MG tablet TAKE 1 TABLET BY MOUTH 2 (TWO) TIMES DAILY AS NEEDED FOR FLUID (SWELLING, SHORTNESS OF BREATH). 180 tablet 1   isosorbide-hydrALAZINE (BIDIL) 20-37.5 MG tablet Take 1 tablet by mouth 3 (three) times daily. (Patient not taking: Reported on 03/26/2021) 90 tablet 3   metoprolol succinate (TOPROL-XL) 25 MG 24 hr tablet Take 1 tablet (25 mg total) by mouth daily. 30 tablet 0   No facility-administered medications prior to visit.   Final Medications at End of Visit    Current Meds  Medication Sig   albuterol (ACCUNEB) 1.25 MG/3ML nebulizer solution Take 1 ampule by nebulization every 4 (four) hours as needed for wheezing or shortness of breath.   albuterol (VENTOLIN HFA) 108 (90 Base) MCG/ACT inhaler Inhale 2 puffs into the lungs every 6 (six) hours as needed.   albuterol (VENTOLIN HFA) 108 (90 Base) MCG/ACT inhaler INHALE 2 PUFF BY MOUTH EVERY 6 HOURS AS NEEDED FOR WHEEZE OR SHORTNESS OF BREATH   amiodarone (PACERONE) 100 MG tablet Take 1 tablet (100 mg total) by mouth daily.   budesonide (PULMICORT) 0.5 MG/2ML  nebulizer solution Take 0.5 mg by nebulization daily as needed (asthma).   dapagliflozin propanediol (FARXIGA) 10 MG TABS tablet Take 1 tablet (10 mg total) by mouth daily before breakfast.   diphenhydrAMINE (BENADRYL) 25 MG tablet Take 25 mg by mouth every 6 (six) hours as needed.   ENTRESTO 97-103 MG Take 1 tablet by mouth daily.   rosuvastatin (CRESTOR) 5 MG tablet TAKE 1 TABLET BY MOUTH EVERY DAY   TRELEGY ELLIPTA 100-62.5-25 MCG/INH AEPB TAKE 1 PUFF BY MOUTH EVERY DAY   triamcinolone cream (KENALOG) 0.1 % Apply 1 application topically 2 (two) times daily.   XARELTO 20 MG TABS tablet TAKE 1 TABLET BY MOUTH EVERY DAY WITH SUPPER   Radiology:   Low-dose CT chest 08/16/2019: Coronary and aortic  atherosclerosis and calcification.  Severe centrilobular emphysema.  2 benign looking nodules.  Benign right upper kidney mass.  Chest x-ray 08/29/2020: Cardiac shadow is within normal limits. Aortic calcifications are seen. The lungs are well aerated bilaterally. No bony abnormality is seen. IMPRESSION: No active disease.   CT head without contrast 08/29/2020: 1. Advanced chronic ischemic microangiopathy without acute intracranial abnormality. 2. No acute fracture or static subluxation of the cervical spine. 3. Small right frontal scalp hematoma.  Cardiac Studies:   Lexiscan myoview stress test 09/18/2016: 1. The resting electrocardiogram demonstrated normal sinus rhythm, normal resting conduction, no resting arrhythmias and normal rest repolarization.  Stress EKG is non-diagnostic for ischemia as it a pharmacologic stress using Lexiscan. Stress symptoms included dyspnea. Occasional PVC noted. 2. The LV is dilated both at rest and stress images. The LV end diastolic volume was 294TM. SPECT images demonstrate Medium perfusion abnormality of moderate intensity in the basal inferior, mid inferior and apical inferior myocardial wall(s) on the stress images.  The defect remains relatively unchanged between rest and stress images and is a soft tissue attenuation artifact, however scar in this region without ischemia cannot be completely excluded. The left ventricular ejection fraction was calculated or visually estimated to be 25% with global hypokinesis. High risk study.  Abdominal aortic duplex 10/06/2016: Diffuse plaque noted in the proximal, mid and distal aorta.  No AAA observed.  Coronary angiogram 11/03/2016: Severe left ventricular systolic dysfunction. The left ventricular ejection fraction is 30-35% by visual estimate with inferior wall akinesis.There is no mitral valve regurgitation. Normal LVEDP. Normal coronary arteries.  Holter Monitor 48 hours 09/07/2017: Minimum heart rate 38 bpm at  12:13 AM maximum heart rate 154 bpm at 12:59 PM. PVCs consisted of 3600 beats, 1.5% burden. Occasional ventricular and triplets, 194 couplets.. Bigeminy and trigeminy. 2 episodes of 4 beat 3 beat NSVT. Predominant rhythm was atrial fibrillation.   Direct current cardioversion 07/30/20 : Indication symptomatic A. Fibrillation. Procedure: Using 80 mg of IV Propofol and 60 IV Lidocaine (for reducing venous pain) for achieving deep sedation, synchronized direct current cardioversion performed. Patient was delivered with 150 Joules of electricity X 1 with success to NSR. Patient tolerated the procedure well. No immediate complication noted.   PCV ECHOCARDIOGRAM COMPLETE 11/06/2020 Left ventricle cavity is normal in size. Mild concentric hypertrophy of the left ventricle. Severe global hypokinesis. LVEF 30-35%. Doppler evidence of grade I (impaired) diastolic dysfunction, normal LAP. Left atrial cavity is severely dilated. Mild (Grade I) mitral regurgitation. Mild tricuspid regurgitation. Compared to previous study on 03/15/2020, LVEF marginally increased from 25-30%. Estimated PASP reduced from 44 to 20 mmHg.  EKG:  02/12/2021: Sinus bradycardia with first-degree AV block at a rate of 58 bpm.  Left axis,  left anterior fascicular block.  Poor R wave progression, cannot exclude anteroseptal infarct old.  Compared to EKG 08/13/2020, no significant change.  EKG 08/13/2020: Sinus bradycardia at a rate of 58 bpm.  Left axis, left anterior fascicular block.  Poor R wave progression, cannot exclude anteroseptal infarct old.  LVH.  Compared to EKG 05/21/2020, now in sinus rhythm.  EKG 05/21/2020: Atrial fibrillation with controlled ventricular response at a rate of 58 bpm.  Left axis, left anterior fascicular block.  LVH. Poor R wave progression, cannot exclude anteroseptal infarct old.Compared to EKG 03/07/2020, no ectopic ventricular beats.  EKG 03/07/2020: Atrial fibrillation with controlled ventricular response at  a rate of 61 beats per with ectopic ventricular beats (2).  Left axis deviation, left anterior fascicular block.  Left ventricular hypertrophy. Poor R wave progression, cannot exclude anterior septal infarct old.  Compared to EKG 09/07/2019, no significant change  Assessment     ICD-10-CM   1. Chronic combined systolic and diastolic CHF (congestive heart failure) (HCC)  I50.42     2. Paroxysmal atrial fibrillation (HCC)  I48.0       Meds ordered this encounter  Medications   metoprolol succinate (TOPROL-XL) 25 MG 24 hr tablet    Sig: Take 1 tablet (25 mg total) by mouth daily.    Dispense:  90 tablet    Refill:  3    Medications Discontinued During This Encounter  Medication Reason   furosemide (LASIX) 20 MG tablet Error   isosorbide-hydrALAZINE (BIDIL) 20-37.5 MG tablet Side effect (s)   metoprolol succinate (TOPROL-XL) 25 MG 24 hr tablet Reorder  This patients CHA2DS2-VASc Score 3 (CHF, A) and yearly risk of stroke 3.2%.    Recommendations:   MARLAN STEWARD  is a 80 y.o.  Caucasian male patient  with nonischemic cardiomyopathy by coronary angiogram on 11/02/2016 revealing ejection fraction of 30-35%, ongoning tobacco use disorder, and permanent A fib. He is asymptomatic in regards to his A fib and is now on anticoagulation.  Echocardiogram 03/2020 revealed significantly decreased LVEF at 25%.  Patient was therefore started on amiodarone and underwent successful cardioversion 07/30/2020.  Patient presents for 6-week follow-up of hypertension and dizziness.  Last office visit reduced amiodarone to 100 mg once daily, added BiDil 1 tablet 3 times daily, and counseled him regarding hydration and conservative measures for positional dizziness.  Patient was unable to tolerate BiDil due to worsening dizziness.  He has been feeling well since discontinuing it, therefore will not resume.  Also patient's home blood pressure readings have been better controlled averaging 130/70 mmHg.  Therefore will  not make changes to medications at this time.  He continues to tolerate guideline directed medical therapy including Delene Loll, Farxiga, And metoprolol.  There is no clinical evidence of heart failure.  He is presently maintaining sinus rhythm and is tolerating Xarelto without bleeding diathesis, will continue anticoagulation.  As patient is on amiodarone, he will need annual monitoring of PFTs, liver function, and thyroid function.  Follow-up in 6 months, sooner if needed, for atrial fibrillation, nonischemic cardiomyopathy, HFrEF.   Alethia Berthold, PA-C 03/26/2021, 8:53 AM Office: [phone]

## 2021-03-26 ENCOUNTER — Ambulatory Visit: Payer: Medicare HMO | Admitting: Student

## 2021-03-26 ENCOUNTER — Other Ambulatory Visit: Payer: Self-pay

## 2021-03-26 ENCOUNTER — Encounter: Payer: Self-pay | Admitting: Student

## 2021-03-26 VITALS — BP 144/73 | HR 79 | Temp 98.0°F | Resp 16 | Ht 71.0 in | Wt 160.0 lb

## 2021-03-26 DIAGNOSIS — I5042 Chronic combined systolic (congestive) and diastolic (congestive) heart failure: Secondary | ICD-10-CM | POA: Diagnosis not present

## 2021-03-26 DIAGNOSIS — I480 Paroxysmal atrial fibrillation: Secondary | ICD-10-CM

## 2021-03-26 MED ORDER — METOPROLOL SUCCINATE ER 25 MG PO TB24: 25.0000 mg | ORAL_TABLET | Freq: Every day | ORAL | 3 refills | Status: DC

## 2021-04-01 ENCOUNTER — Other Ambulatory Visit: Payer: Self-pay | Admitting: Student

## 2021-04-01 DIAGNOSIS — I5042 Chronic combined systolic (congestive) and diastolic (congestive) heart failure: Secondary | ICD-10-CM

## 2021-04-07 DIAGNOSIS — I5042 Chronic combined systolic (congestive) and diastolic (congestive) heart failure: Secondary | ICD-10-CM | POA: Diagnosis not present

## 2021-04-08 LAB — BASIC METABOLIC PANEL
BUN/Creatinine Ratio: 13 (ref 10–24)
BUN: 20 mg/dL (ref 8–27)
CO2: 23 mmol/L (ref 20–29)
Calcium: 8.6 mg/dL (ref 8.6–10.2)
Chloride: 98 mmol/L (ref 96–106)
Creatinine, Ser: 1.54 mg/dL — ABNORMAL HIGH (ref 0.76–1.27)
Glucose: 92 mg/dL (ref 70–99)
Potassium: 5.1 mmol/L (ref 3.5–5.2)
Sodium: 137 mmol/L (ref 134–144)
eGFR: 46 mL/min/{1.73_m2} — ABNORMAL LOW (ref >59)

## 2021-04-08 LAB — PRO B NATRIURETIC PEPTIDE: NT-Pro BNP: 3314 pg/mL — ABNORMAL HIGH (ref 0–486)

## 2021-04-19 ENCOUNTER — Other Ambulatory Visit: Payer: Self-pay | Admitting: Student

## 2021-04-19 DIAGNOSIS — I5042 Chronic combined systolic (congestive) and diastolic (congestive) heart failure: Secondary | ICD-10-CM

## 2021-04-19 DIAGNOSIS — I428 Other cardiomyopathies: Secondary | ICD-10-CM

## 2021-04-22 DIAGNOSIS — I509 Heart failure, unspecified: Secondary | ICD-10-CM | POA: Diagnosis not present

## 2021-04-22 DIAGNOSIS — J40 Bronchitis, not specified as acute or chronic: Secondary | ICD-10-CM | POA: Diagnosis not present

## 2021-04-24 ENCOUNTER — Encounter: Payer: Self-pay | Admitting: Cardiology

## 2021-04-24 ENCOUNTER — Other Ambulatory Visit: Payer: Self-pay

## 2021-04-24 VITALS — BP 130/58 | HR 65 | Resp 18 | Ht 71.0 in | Wt 159.0 lb

## 2021-04-24 DIAGNOSIS — I5042 Chronic combined systolic (congestive) and diastolic (congestive) heart failure: Secondary | ICD-10-CM

## 2021-04-24 DIAGNOSIS — Z006 Encounter for examination for normal comparison and control in clinical research program: Secondary | ICD-10-CM

## 2021-04-24 NOTE — Progress Notes (Addendum)
Chief Complaint  Patient presents with   Research Consent     Patient  screened into LUX-Sx TRENDS study (loop implantation for heart failure sensor).    Today's Vitals   04/24/21 1235  BP: (!) 130/58  Pulse: 65  Resp: 18  Weight: 159 lb (72.1 kg)  Height: 5\' 11"  (1.803 m)   Body mass index is 22.18 kg/m.   Physical Exam Constitutional:      Appearance: He is well-developed.  Neck:     Thyroid: No thyromegaly.     Vascular: No JVD.  Cardiovascular:     Rate and Rhythm: Normal rate and regular rhythm.     Pulses: Intact distal pulses with diminished intensity.         Heart sounds: Heart sounds are distant. No murmur heard.   No gallop.  No JVD.  No leg edema. Pulmonary:     Effort: Pulmonary effort is normal. No accessory muscle usage or respiratory distress.     Breath sounds: Rhonchi present. No wheezing.    Prodedure performed: Insertion of Chemical engineer Lux-Dx [employer]. Serial # [id number]  Indication: Chronic systolic and diastolic heart failure   After obtaining informed consent, explaining the procedure to the patient, under sterile precautions, local anesthesia with 1% lidocaine with epinephrine was injected subcutaneously in the left parasternal region.  20 mL utilized.  A small nick was made in the left paraspinal region at the 3rd intercostal space. Lux-Dx loop recorder was inserted without any complications.  Patient tolerated the procedure well.  The incision was closed with Steri-Strips.  There is no blood loss, no hematoma.  Written instrtuctions regarding wound care given to the patient.  Device setting:  R wave amplitude 0.23 mV.   Programmed:  AF detection 110 bpm. HVR 170/min. Bradycardia 40 BPM Asystole 3 Sec Patient triggered: Off    ICD-10-CM   1. Chronic combined systolic and diastolic CHF (congestive heart failure) (HCC)  I50.42 Pro b natriuretic peptide (BNP)    2. Research subject: LUX-Sx TRENDS study (loop implantation for heart  failure sensor) 04/24/2021  Z00.6        [name], MD, [facility] 04/24/2021, 12:37 PM Office: [phone] Fax: [phone] Pager: [phone]

## 2021-04-24 NOTE — Addendum Note (Signed)
Addended by: Kela Millin on: 04/24/2021 12:37 PM   Modules accepted: Orders

## 2021-04-24 NOTE — Patient Instructions (Signed)
You can take a shower tomorrow evening with the dressing on.  Do not take the dressing off unless it falls off for 1 more day.  However if the dressing falls off, not to worry, keep the site dry.  There will be 3 Steri-Strips, they will follow-up by themselves over the next 1 week to 10 days.  He can also put a Band-Aid on top of the wound if necessary.  Please contact us if there is any bloody discharge or if the wound starts to open up or if there is any significant swelling or pain.

## 2021-04-25 LAB — PRO B NATRIURETIC PEPTIDE: NT-Pro BNP: 1629 pg/mL — ABNORMAL HIGH (ref 0–486)

## 2021-04-25 NOTE — Progress Notes (Signed)
Results of NT ProBNP

## 2021-04-25 NOTE — Progress Notes (Signed)
NT proBNP has improved from >3300 to the present 1629.

## 2021-04-28 ENCOUNTER — Other Ambulatory Visit: Payer: Self-pay | Admitting: Student

## 2021-04-28 ENCOUNTER — Other Ambulatory Visit: Payer: Self-pay | Admitting: Cardiology

## 2021-04-28 DIAGNOSIS — I4821 Permanent atrial fibrillation: Secondary | ICD-10-CM

## 2021-04-28 NOTE — Progress Notes (Deleted)
Patient here for wound check. Patient underwent loop recorder implantation 5 days ago, which required closure with Steri-strips, no sutures.   Patient denies pain, redness, or drainage from the wound. The area was cleaned and examined. Informed patient to monitor the area for signs of infection and to let the office know if these develop, they expressed understanding.  ***

## 2021-04-29 ENCOUNTER — Ambulatory Visit: Payer: Self-pay | Admitting: Student

## 2021-05-02 ENCOUNTER — Encounter (HOSPITAL_COMMUNITY): Payer: Self-pay | Admitting: Emergency Medicine

## 2021-05-02 ENCOUNTER — Emergency Department (HOSPITAL_COMMUNITY): Payer: Medicare HMO

## 2021-05-02 ENCOUNTER — Ambulatory Visit: Payer: Medicare HMO | Admitting: Primary Care

## 2021-05-02 ENCOUNTER — Inpatient Hospital Stay (HOSPITAL_COMMUNITY)
Admission: EM | Admit: 2021-05-02 | Discharge: 2021-05-06 | DRG: 291 | Disposition: A | Discharge status: Home / Self Care | Payer: Medicare HMO | Attending: Internal Medicine | Admitting: Internal Medicine

## 2021-05-02 ENCOUNTER — Other Ambulatory Visit: Payer: Self-pay

## 2021-05-02 ENCOUNTER — Ambulatory Visit (INDEPENDENT_AMBULATORY_CARE_PROVIDER_SITE_OTHER): Payer: Medicare HMO

## 2021-05-02 ENCOUNTER — Encounter: Payer: Self-pay | Admitting: Primary Care

## 2021-05-02 VITALS — BP 134/88 | HR 72 | Temp 98.1°F | Ht 71.0 in | Wt 168.2 lb

## 2021-05-02 DIAGNOSIS — J441 Chronic obstructive pulmonary disease with (acute) exacerbation: Secondary | ICD-10-CM | POA: Diagnosis not present

## 2021-05-02 DIAGNOSIS — U071 COVID-19: Secondary | ICD-10-CM | POA: Diagnosis not present

## 2021-05-02 DIAGNOSIS — A419 Sepsis, unspecified organism: Secondary | ICD-10-CM | POA: Diagnosis not present

## 2021-05-02 DIAGNOSIS — J189 Pneumonia, unspecified organism: Secondary | ICD-10-CM | POA: Diagnosis not present

## 2021-05-02 DIAGNOSIS — F17210 Nicotine dependence, cigarettes, uncomplicated: Secondary | ICD-10-CM | POA: Diagnosis present

## 2021-05-02 DIAGNOSIS — R9431 Abnormal electrocardiogram [ECG] [EKG]: Secondary | ICD-10-CM | POA: Diagnosis not present

## 2021-05-02 DIAGNOSIS — J1282 Pneumonia due to coronavirus disease 2019: Secondary | ICD-10-CM | POA: Diagnosis not present

## 2021-05-02 DIAGNOSIS — R0902 Hypoxemia: Secondary | ICD-10-CM | POA: Diagnosis not present

## 2021-05-02 DIAGNOSIS — E785 Hyperlipidemia, unspecified: Secondary | ICD-10-CM | POA: Diagnosis present

## 2021-05-02 DIAGNOSIS — N1832 Chronic kidney disease, stage 3b: Secondary | ICD-10-CM | POA: Diagnosis present

## 2021-05-02 DIAGNOSIS — J9601 Acute respiratory failure with hypoxia: Secondary | ICD-10-CM | POA: Diagnosis not present

## 2021-05-02 DIAGNOSIS — R0602 Shortness of breath: Secondary | ICD-10-CM

## 2021-05-02 DIAGNOSIS — J9600 Acute respiratory failure, unspecified whether with hypoxia or hypercapnia: Secondary | ICD-10-CM | POA: Insufficient documentation

## 2021-05-02 DIAGNOSIS — J439 Emphysema, unspecified: Secondary | ICD-10-CM | POA: Diagnosis not present

## 2021-05-02 DIAGNOSIS — I700 Atherosclerosis of aorta: Secondary | ICD-10-CM | POA: Diagnosis not present

## 2021-05-02 DIAGNOSIS — I480 Paroxysmal atrial fibrillation: Secondary | ICD-10-CM | POA: Diagnosis present

## 2021-05-02 DIAGNOSIS — I5043 Acute on chronic combined systolic (congestive) and diastolic (congestive) heart failure: Principal | ICD-10-CM | POA: Diagnosis present

## 2021-05-02 DIAGNOSIS — Z7901 Long term (current) use of anticoagulants: Secondary | ICD-10-CM

## 2021-05-02 DIAGNOSIS — I5042 Chronic combined systolic (congestive) and diastolic (congestive) heart failure: Secondary | ICD-10-CM | POA: Diagnosis present

## 2021-05-02 DIAGNOSIS — Z7951 Long term (current) use of inhaled steroids: Secondary | ICD-10-CM

## 2021-05-02 DIAGNOSIS — Z7984 Long term (current) use of oral hypoglycemic drugs: Secondary | ICD-10-CM

## 2021-05-02 DIAGNOSIS — I255 Ischemic cardiomyopathy: Secondary | ICD-10-CM | POA: Diagnosis present

## 2021-05-02 DIAGNOSIS — Z79899 Other long term (current) drug therapy: Secondary | ICD-10-CM

## 2021-05-02 DIAGNOSIS — I4891 Unspecified atrial fibrillation: Secondary | ICD-10-CM | POA: Diagnosis present

## 2021-05-02 DIAGNOSIS — R918 Other nonspecific abnormal finding of lung field: Secondary | ICD-10-CM | POA: Diagnosis not present

## 2021-05-02 LAB — CBC WITH DIFFERENTIAL/PLATELET
Abs Immature Granulocytes: 0.31 10*3/uL — ABNORMAL HIGH (ref 0.00–0.07)
Basophils Absolute: 0.1 10*3/uL (ref 0.0–0.1)
Basophils Relative: 0 %
Eosinophils Absolute: 0 10*3/uL (ref 0.0–0.5)
Eosinophils Relative: 0 %
HCT: 42.6 % (ref 39.0–52.0)
Hemoglobin: 13.6 g/dL (ref 13.0–17.0)
Immature Granulocytes: 2 %
Lymphocytes Relative: 3 %
Lymphs Abs: 0.5 10*3/uL — ABNORMAL LOW (ref 0.7–4.0)
MCH: 30.8 pg (ref 26.0–34.0)
MCHC: 31.9 g/dL (ref 30.0–36.0)
MCV: 96.4 fL (ref 80.0–100.0)
Monocytes Absolute: 0.7 10*3/uL (ref 0.1–1.0)
Monocytes Relative: 4 %
Neutro Abs: 14.9 10*3/uL — ABNORMAL HIGH (ref 1.7–7.7)
Neutrophils Relative %: 91 %
Platelets: 305 10*3/uL (ref 150–400)
RBC: 4.42 MIL/uL (ref 4.22–5.81)
RDW: 13.4 % (ref 11.5–15.5)
WBC: 16.4 10*3/uL — ABNORMAL HIGH (ref 4.0–10.5)
nRBC: 0 % (ref 0.0–0.2)

## 2021-05-02 LAB — COMPREHENSIVE METABOLIC PANEL
ALT: 46 U/L — ABNORMAL HIGH (ref 0–44)
AST: 37 U/L (ref 15–41)
Albumin: 2.8 g/dL — ABNORMAL LOW (ref 3.5–5.0)
Alkaline Phosphatase: 76 U/L (ref 38–126)
Anion gap: 9 (ref 5–15)
BUN: 24 mg/dL — ABNORMAL HIGH (ref 8–23)
CO2: 26 mmol/L (ref 22–32)
Calcium: 8.4 mg/dL — ABNORMAL LOW (ref 8.9–10.3)
Chloride: 95 mmol/L — ABNORMAL LOW (ref 98–111)
Creatinine, Ser: 1.67 mg/dL — ABNORMAL HIGH (ref 0.61–1.24)
GFR, Estimated: 41 mL/min — ABNORMAL LOW (ref >60)
Glucose, Bld: 170 mg/dL — ABNORMAL HIGH (ref 70–99)
Potassium: 4.7 mmol/L (ref 3.5–5.1)
Sodium: 130 mmol/L — ABNORMAL LOW (ref 135–145)
Total Bilirubin: 0.9 mg/dL (ref 0.3–1.2)
Total Protein: 6.4 g/dL — ABNORMAL LOW (ref 6.5–8.1)

## 2021-05-02 LAB — URINALYSIS, ROUTINE W REFLEX MICROSCOPIC
Bilirubin Urine: NEGATIVE
Glucose, UA: >=500 mg/dL — AB
Ketones, ur: NEGATIVE mg/dL
Leukocytes,Ua: NEGATIVE
Nitrite: NEGATIVE
Protein, ur: 30 mg/dL — AB
Specific Gravity, Urine: 1.02 (ref 1.005–1.030)
pH: 6 (ref 5.0–8.0)

## 2021-05-02 LAB — RESP PANEL BY RT-PCR (FLU A&B, COVID) ARPGX2
Influenza A by PCR: NEGATIVE
Influenza B by PCR: NEGATIVE
SARS Coronavirus 2 by RT PCR: POSITIVE — AB

## 2021-05-02 LAB — PROTIME-INR
INR: 1.5 — ABNORMAL HIGH (ref 0.8–1.2)
Prothrombin Time: 18.4 seconds — ABNORMAL HIGH (ref 11.4–15.2)

## 2021-05-02 LAB — URINALYSIS, MICROSCOPIC (REFLEX): Bacteria, UA: NONE SEEN

## 2021-05-02 LAB — APTT: aPTT: 33 seconds (ref 24–36)

## 2021-05-02 LAB — LACTIC ACID, PLASMA: Lactic Acid, Venous: 1.2 mmol/L (ref 0.5–1.9)

## 2021-05-02 MED ORDER — METHYLPREDNISOLONE SODIUM SUCC 40 MG IJ SOLR
40.0000 mg | Freq: Two times a day (BID) | INTRAMUSCULAR | Status: DC
  Administered 2021-05-02 – 2021-05-03 (×3): 40 mg via INTRAVENOUS
  Filled 2021-05-02 (×3): qty 1

## 2021-05-02 MED ORDER — SODIUM CHLORIDE 0.9 % IV SOLN
500.0000 mg | INTRAVENOUS | Status: DC
  Administered 2021-05-02: 22:00:00 500 mg via INTRAVENOUS
  Filled 2021-05-02 (×2): qty 5

## 2021-05-02 MED ORDER — ALBUTEROL SULFATE (2.5 MG/3ML) 0.083% IN NEBU
2.5000 mg | INHALATION_SOLUTION | Freq: Four times a day (QID) | RESPIRATORY_TRACT | Status: DC
  Administered 2021-05-02: 22:00:00 2.5 mg via RESPIRATORY_TRACT
  Filled 2021-05-02: qty 3

## 2021-05-02 MED ORDER — FLUTICASONE-UMECLIDIN-VILANT 100-62.5-25 MCG/ACT IN AEPB: 1.0000 | INHALATION_SPRAY | Freq: Every day | RESPIRATORY_TRACT | Status: DC

## 2021-05-02 MED ORDER — IPRATROPIUM-ALBUTEROL 0.5-2.5 (3) MG/3ML IN SOLN: 3.0000 mL | Freq: Once | RESPIRATORY_TRACT | Status: DC

## 2021-05-02 MED ORDER — LACTATED RINGERS IV SOLN: INTRAVENOUS | Status: DC

## 2021-05-02 MED ORDER — ALBUTEROL SULFATE HFA 108 (90 BASE) MCG/ACT IN AERS: 2.0000 | INHALATION_SPRAY | RESPIRATORY_TRACT | Status: DC | PRN

## 2021-05-02 MED ORDER — SODIUM CHLORIDE 0.9 % IV SOLN
2.0000 g | INTRAVENOUS | Status: DC
  Administered 2021-05-02: 19:00:00 2 g via INTRAVENOUS
  Filled 2021-05-02: qty 20

## 2021-05-02 MED ORDER — ACETAMINOPHEN 650 MG RE SUPP: 650.0000 mg | Freq: Four times a day (QID) | RECTAL | Status: DC | PRN

## 2021-05-02 MED ORDER — METOPROLOL SUCCINATE ER 25 MG PO TB24
25.0000 mg | ORAL_TABLET | Freq: Every day | ORAL | Status: DC
  Administered 2021-05-03 – 2021-05-06 (×4): 25 mg via ORAL
  Filled 2021-05-02 (×4): qty 1

## 2021-05-02 MED ORDER — METHYLPREDNISOLONE SODIUM SUCC 125 MG IJ SOLR
125.0000 mg | Freq: Once | INTRAMUSCULAR | Status: AC
  Administered 2021-05-02: 19:00:00 125 mg via INTRAVENOUS
  Filled 2021-05-02: qty 2

## 2021-05-02 MED ORDER — IPRATROPIUM-ALBUTEROL 0.5-2.5 (3) MG/3ML IN SOLN
3.0000 mL | Freq: Once | RESPIRATORY_TRACT | Status: AC
  Administered 2021-05-02: 19:00:00 3 mL via RESPIRATORY_TRACT
  Filled 2021-05-02: qty 3

## 2021-05-02 MED ORDER — RIVAROXABAN 10 MG PO TABS
20.0000 mg | ORAL_TABLET | Freq: Every day | ORAL | Status: DC
  Administered 2021-05-03 – 2021-05-04 (×2): 20 mg via ORAL
  Filled 2021-05-02 (×2): qty 2

## 2021-05-02 MED ORDER — SACUBITRIL-VALSARTAN 97-103 MG PO TABS
1.0000 | ORAL_TABLET | Freq: Every day | ORAL | Status: DC
  Administered 2021-05-03 – 2021-05-06 (×4): 1 via ORAL
  Filled 2021-05-02 (×4): qty 1

## 2021-05-02 MED ORDER — ACETAMINOPHEN 325 MG PO TABS: 650.0000 mg | ORAL_TABLET | Freq: Four times a day (QID) | ORAL | Status: DC | PRN

## 2021-05-02 MED ORDER — ALBUTEROL SULFATE (2.5 MG/3ML) 0.083% IN NEBU
2.5000 mg | INHALATION_SOLUTION | RESPIRATORY_TRACT | Status: DC
  Administered 2021-05-03 – 2021-05-04 (×7): 2.5 mg via RESPIRATORY_TRACT
  Filled 2021-05-02 (×7): qty 3

## 2021-05-02 MED ORDER — HYDRALAZINE HCL 20 MG/ML IJ SOLN: 10.0000 mg | INTRAMUSCULAR | Status: DC | PRN

## 2021-05-02 MED ORDER — AMIODARONE HCL 200 MG PO TABS
100.0000 mg | ORAL_TABLET | Freq: Every day | ORAL | Status: DC
  Administered 2021-05-03 – 2021-05-06 (×4): 100 mg via ORAL
  Filled 2021-05-02 (×4): qty 1

## 2021-05-02 MED ORDER — ROSUVASTATIN CALCIUM 5 MG PO TABS
5.0000 mg | ORAL_TABLET | Freq: Every day | ORAL | Status: DC
  Administered 2021-05-03 – 2021-05-06 (×4): 5 mg via ORAL
  Filled 2021-05-02 (×4): qty 1

## 2021-05-02 NOTE — Addendum Note (Signed)
Addended by: Fran Lowes on: 05/02/2021 12:05 PM   Modules accepted: Orders

## 2021-05-02 NOTE — ED Triage Notes (Signed)
Pt sent here by PCP for pneumonia, shortness of breath and low SpO2 sat. Pt went to see pcp for shob, did an xray today that showed pneumonia. Pt is requiring 2L O2 via [place]. Shob worse w/ exertion. Denies pain, fever, chills, N/V.

## 2021-05-02 NOTE — H&P (Addendum)
History and Physical    Mario Proctor ZDG:[id number] DOB: 1940/08/02 DOA: 05/02/2021  PCP: Tamsen Roers, MD  Patient coming from: Home.  Chief Complaint: Shortness of breath.  HPI: Mario Proctor is a 80 y.o. male with history of chronic combined systolic and diastolic CHF, COPD, atrial fibrillation, hyperlipidemia and chronic kidney disease stage III has been experiencing increasing shortness of breath and nonproductive cough for the last 1 week.  Patient had gone to his pulmonologist clinic where x-ray showed pneumonia and since patient was hypoxic and was referred to the ER.  Denies chest pain nausea vomiting or diarrhea.  Denies any fever or chills.  ED Course: In the ER patient is found to be hypoxic requiring 2 L oxygen with CT chest showing multifocal pneumonia and also spiculated nodule.  Labs show sodium of 130 creatinine 1.6 WBC of 16.4 lactic acid was normal COVID test pending patient was started on empiric antibiotics for pneumonia and admitted for further management.  Review of Systems: As per HPI, rest all negative.   Past Medical History:  Diagnosis Date   A-fib [facility])    Allergy    CHF (congestive heart failure) (HCC)    Dupuytren contracture    right sm finger   Myocardial infarct ([place]) 2019   Myocardial infarction ([place])    Small bowel obstruction [facility])     Past Surgical History:  Procedure Laterality Date   CARDIOVERSION N/A 07/30/2020   Procedure: CARDIOVERSION;  Surgeon: [name], MD;  Location: [place];  Service: Cardiovascular;  Laterality: N/A;   COLON SURGERY     bowel blockage   DUPUYTREN CONTRACTURE RELEASE Left 2011   FASCIECTOMY Right 05/02/2015   Procedure: FASCIECTOMY RIGHT SMALL FINGER;  Surgeon: Daryll Brod, MD;  Location: [place];  Service: Orthopedics;  Laterality: Right;  axillary block in preop   LEFT HEART CATH AND CORONARY ANGIOGRAPHY N/A 11/03/2016   Procedure: Left Heart Cath and Coronary Angiography;  Surgeon:  [name], MD;  Location: [place] [facility];  Service: Cardiovascular;  Laterality: N/A;   TONSILLECTOMY     TYMPANOPLASTY Right      reports that he has been smoking cigarettes. He has a 25.00 pack-year smoking history. He has never used smokeless tobacco. He reports current alcohol use of about 6.0 standard drinks per week. He reports that he does not use drugs.  No Known Allergies  Family History  Problem Relation Age of Onset   Cancer Brother        "in heart and lungs"     Prior to Admission medications   Medication Sig Start Date End Date Taking? Authorizing Provider  albuterol (ACCUNEB) 1.25 MG/3ML nebulizer solution Take 1 ampule by nebulization every 4 (four) hours as needed for wheezing or shortness of breath. 06/28/18  Yes [provider]  albuterol (VENTOLIN HFA) 108 (90 Base) MCG/ACT inhaler INHALE 2 PUFF BY MOUTH EVERY 6 HOURS AS NEEDED FOR WHEEZE OR SHORTNESS OF BREATH Patient taking differently: Inhale 2 puffs into the lungs every 6 (six) hours as needed for shortness of breath or wheezing. 02/20/21  Yes Mannam, Praveen, MD  amiodarone (PACERONE) 100 MG tablet Take 1 tablet (100 mg total) by mouth daily. 02/12/21  Yes Cantwell, Celeste C, PA-C  dapagliflozin propanediol (FARXIGA) 10 MG TABS tablet Take 1 tablet (10 mg total) by mouth daily before breakfast. 03/11/21  Yes Cantwell, Celeste C, PA-C  diphenhydrAMINE (BENADRYL) 25 MG tablet Take 25 mg by mouth every 6 (six) hours  as needed.   Yes [provider]  ENTRESTO 97-103 MG TAKE 1 TABLET BY MOUTH EVERY DAY Patient taking differently: Take 1 tablet by mouth daily. 04/21/21  Yes Cantwell, Celeste C, PA-C  metoprolol succinate (TOPROL-XL) 25 MG 24 hr tablet Take 1 tablet (25 mg total) by mouth daily. 03/26/21 03/21/22 Yes Cantwell, Celeste C, PA-C  predniSONE (STERAPRED UNI-PAK 48 TAB) 10 MG (48) TBPK tablet Take 1-4 tablets by mouth See admin instructions. For the first 3 days, take 4 tablets every morning  with breakfast. For the next 3 days, take 3 tablets every morning with breakfast. For the next 3 days, take 2 tablets in the morning with breakfast. For the last 3 days, take 1 tablet every morning with breakfast. 04/22/21  Yes [provider]  rosuvastatin (CRESTOR) 5 MG tablet TAKE 1 TABLET BY MOUTH EVERY DAY Patient taking differently: Take by mouth daily. 08/26/20  Yes Cantwell, Celeste C, PA-C  TRELEGY ELLIPTA 100-62.5-25 MCG/INH AEPB TAKE 1 PUFF BY MOUTH EVERY DAY Patient taking differently: Take 1 puff by mouth daily. 01/28/21  Yes Mannam, Praveen, MD  triamcinolone cream (KENALOG) 0.1 % Apply 1 application topically 2 (two) times daily. 08/07/20  Yes [provider]  XARELTO 20 MG TABS tablet TAKE 1 TABLET BY MOUTH EVERY DAY WITH SUPPER Patient taking differently: Take 20 mg by mouth daily with supper. 09/30/20  Yes Cantwell, Gerline Legacy, PA-C    Physical Exam: Constitutional: Moderately built and nourished. Vitals:   05/02/21 1344 05/02/21 1542 05/02/21 1712 05/02/21 1915  BP:  (!) 159/91 (!) 156/71 (!) 172/85  Pulse: 66 65 100 60  Resp:  18 18 18   Temp:      TempSrc:      SpO2: 96% 99% 99% 96%   Eyes: Anicteric no pallor. ENMT: No discharge from the ears eyes nose and mouth. Neck: No JVD appreciated no mass felt. Respiratory: Bilateral expiratory wheeze heard.  No crepitations. Cardiovascular: S1-S2 heard. Abdomen: Soft nontender bowel sound present. Musculoskeletal: No edema. Skin: No rash. Neurologic: Alert awake oriented to time place and person.  Moves all extremities. Psychiatric: Appears normal.  Normal affect.   Labs on Admission: I have personally reviewed following labs and imaging studies  CBC: Recent Labs  Lab 05/02/21 1436  WBC 16.4*  NEUTROABS 14.9*  HGB 13.6  HCT 42.6  MCV 96.4  PLT 063   Basic Metabolic Panel: Recent Labs  Lab 05/02/21 1436  NA 130*  K 4.7  CL 95*  CO2 26  GLUCOSE 170*  BUN 24*  CREATININE 1.67*  CALCIUM  8.4*   GFR: Estimated Creatinine Clearance: 38.2 mL/min (A) (by C-G formula based on SCr of 1.67 mg/dL (H)). Liver Function Tests: Recent Labs  Lab 05/02/21 1436  AST 37  ALT 46*  ALKPHOS 76  BILITOT 0.9  PROT 6.4*  ALBUMIN 2.8*   No results for input(s): LIPASE, AMYLASE in the last 168 hours. No results for input(s): AMMONIA in the last 168 hours. Coagulation Profile: Recent Labs  Lab 05/02/21 1842  INR 1.5*   Cardiac Enzymes: No results for input(s): CKTOTAL, CKMB, CKMBINDEX, TROPONINI in the last 168 hours. BNP (last 3 results) Recent Labs    08/29/20 0925 04/07/21 1313 04/24/21 1321  PROBNP 1,322* 3,314* 1,629*   HbA1C: No results for input(s): HGBA1C in the last 72 hours. CBG: No results for input(s): GLUCAP in the last 168 hours. Lipid Profile: No results for input(s): CHOL, HDL, LDLCALC, TRIG, CHOLHDL, LDLDIRECT in the last  72 hours. Thyroid Function Tests: No results for input(s): TSH, T4TOTAL, FREET4, T3FREE, THYROIDAB in the last 72 hours. Anemia Panel: No results for input(s): VITAMINB12, FOLATE, FERRITIN, TIBC, IRON, RETICCTPCT in the last 72 hours. Urine analysis:    Component Value Date/Time   COLORURINE STRAW (A) 08/30/2020 0051   APPEARANCEUR CLEAR 08/30/2020 0051   LABSPEC 1.006 08/30/2020 0051   PHURINE 7.0 08/30/2020 0051   GLUCOSEU NEGATIVE 08/30/2020 0051   HGBUR NEGATIVE 08/30/2020 0051   BILIRUBINUR NEGATIVE 08/30/2020 0051   KETONESUR NEGATIVE 08/30/2020 0051   PROTEINUR NEGATIVE 08/30/2020 0051   NITRITE NEGATIVE 08/30/2020 0051   LEUKOCYTESUR NEGATIVE 08/30/2020 0051   Sepsis Labs: @LABRCNTIP (procalcitonin:4,lacticidven:4) ) Recent Results (from the past 240 hour(s))  Resp Panel by RT-PCR (Flu A&B, Covid) Nasopharyngeal Swab     Status: Abnormal   Collection Time: 05/02/21  8:15 PM   Specimen: Nasopharyngeal Swab; Nasopharyngeal(NP) swabs in vial transport medium  Result Value Ref Range Status   SARS Coronavirus 2 by RT PCR  POSITIVE (A) NEGATIVE Final    Comment: (NOTE) SARS-CoV-2 target nucleic acids are DETECTED.  The SARS-CoV-2 RNA is generally detectable in upper respiratory specimens during the acute phase of infection. Positive results are indicative of the presence of the identified virus, but do not rule out bacterial infection or co-infection with other pathogens not detected by the test. Clinical correlation with patient history and other diagnostic information is necessary to determine patient infection status. The expected result is Negative.  Fact Sheet for Patients: EntrepreneurPulse.com.au  Fact Sheet for Healthcare Providers: IncredibleEmployment.be  This test is not yet approved or cleared by the [place] FDA and  has been authorized for detection and/or diagnosis of SARS-CoV-2 by FDA under an Emergency Use Authorization (EUA).  This EUA will remain in effect (meaning this test can be used) for the duration of  the COVID-19 declaration under Section 564(b)(1) of the A ct, 21 U.S.C. section 360bbb-3(b)(1), unless the authorization is terminated or revoked sooner.     Influenza A by PCR NEGATIVE NEGATIVE Final   Influenza B by PCR NEGATIVE NEGATIVE Final    Comment: (NOTE) The Xpert Xpress SARS-CoV-2/FLU/RSV plus assay is intended as an aid in the diagnosis of influenza from Nasopharyngeal swab specimens and should not be used as a sole basis for treatment. Nasal washings and aspirates are unacceptable for Xpert Xpress SARS-CoV-2/FLU/RSV testing.  Fact Sheet for Patients: EntrepreneurPulse.com.au  Fact Sheet for Healthcare Providers: IncredibleEmployment.be  This test is not yet approved or cleared by the [place] FDA and has been authorized for detection and/or diagnosis of SARS-CoV-2 by FDA under an Emergency Use Authorization (EUA). This EUA will remain in effect (meaning this test can be used)  for the duration of the COVID-19 declaration under Section 564(b)(1) of the Act, 21 U.S.C. section 360bbb-3(b)(1), unless the authorization is terminated or revoked.  Performed at [facility], [place] [address], [place], [place] [address]      Radiological Exams on Admission: DG Chest 2 View  Result Date: 05/02/2021 CLINICAL DATA:  Provided history: Shortness of breath for 1 week, new hypoxia EXAM: CHEST - 2 VIEW COMPARISON:  Chest CT 10/14/2020.  Chest radiographs 05/14/2020 FINDINGS: An ovoid electronic device projects over the left chest. This may reflect a loop recorder or leadless pacer device. Heart size within normal limits. Aortic atherosclerosis. Moderate-sized airspace opacity within the right lower lobe and right infrahilar region. Additionally, there is suggestion of a subtle nodular airspace opacity within the left  upper lobe. No evidence of pleural effusion or pneumothorax. No acute bony abnormality identified. IMPRESSION: Moderate-sized airspace opacity within the right lower lobe and right infrahilar region, compatible with pneumonia in the appropriate clinical setting. Followup PA and lateral chest radiographs are recommended in 3-4 weeks following trial of antibiotic therapy to ensure resolution and exclude underlying malignancy. Suggestion of a subtle nodular airspace opacity within the left upper lobe. This may also be infectious/inflammatory in etiology. However, if this finding persists at time of radiographic follow-up, a chest CT is recommended for further evaluation. Aortic Atherosclerosis (ICD10-I70.0). Electronically Signed   By: Kellie Simmering D.O.   On: 05/02/2021 11:13   CT Chest Wo Contrast  Result Date: 05/02/2021 CLINICAL DATA:  Respiratory illness, abnormal chest radiograph, hypoxia EXAM: CT CHEST WITHOUT CONTRAST TECHNIQUE: Multidetector CT imaging of the chest was performed following the standard protocol without IV contrast. COMPARISON:  Chest radiographs  dated 05/02/2021. CT chest dated 10/14/2020. FINDINGS: Cardiovascular: Heart is normal in size.  No pericardial effusion. No evidence of thoracic aortic aneurysm. Atherosclerotic calcifications of the arch. Mild coronary atherosclerosis of the LAD and right coronary artery. Mediastinum/Nodes: Small mediastinal lymph nodes, including a 9 mm short axis AP window node (series 3/image 32), likely reactive. Visualized thyroid is unremarkable. Lungs/Pleura: 2.7 x 4.2 cm irregular/spiculated masslike opacity in the posterior right lower lobe (series 4/image 101). Mild associated hyperdensity within the lesion (series 3/image 102). Adjacent debris within right lower lobe bronchi (series 4/image 101). Additional 1.4 x 2.6 cm irregular nodular opacity in the posterior right upper lobe (series 4/image 35) and 7 x 12 mm irregular nodular opacity in the posterior left upper lobe (series 4/image 61). Additional mild irregular linear/nodular opacity in the right upper lobe (series 4/image 45). Despite the masslike appearance of the dominant lesion, the overall appearance, distribution, and ancillary findings favor multifocal infection/pneumonia, possibly on the basis of aspiration. However, this does warrant follow-up. Moderate centrilobular and paraseptal emphysematous changes, upper lung predominant. No pleural effusion or pneumothorax. Upper Abdomen: Visualized upper abdomen is notable for vascular calcifications and a 2.4 cm right upper pole renal cyst. Musculoskeletal: Mild superior endplate compression fracture deformity at L1, new from the prior. IMPRESSION: Multifocal patchy opacities including a 4.2 cm irregular/spiculated masslike opacity in the posterior right lower lobe. Overall appearance/distribution favors multifocal infection/pneumonia, possibly on the basis of aspiration. However, follow-up CT chest is suggested in 6-12 weeks (after appropriate antimicrobial therapy) to document improvement/resolution. Small  mediastinal lymph nodes, likely reactive. Mild superior endplate compression fracture deformity at L1, new from the prior. Aortic Atherosclerosis (ICD10-I70.0) and Emphysema (ICD10-J43.9). Electronically Signed   By: Julian Hy M.D.   On: 05/02/2021 15:04    EKG: Independently reviewed.  Sinus rhythm with first-degree AV block.  Assessment/Plan Principal Problem:   CAP (community acquired pneumonia) Active Problems:   Cardiomyopathy, ischemic   Chronic combined systolic and diastolic CHF (congestive heart failure) (HCC)   A-fib (HCC)   COPD with acute exacerbation (HCC)    Acute respiratory failure with hypoxia presently requiring 2 L oxygen with patient having multifocal pneumonia on the CT scan also diffusely wheezing admitted for treatment of pneumonia and COPD. Multifocal pneumonia presently on antibiotics for community-acquired pneumonia COVID test and flu test are pending.  Follow cultures. COPD exacerbation for which patient wanted IV steroids nebulizer and home inhalers. Spiculated opacity seen in the CAT scan presently radiologist favors multifocal pneumonia.  However after antibiotic course patient will need close follow-up and repeat scanning in few  weeks to rule out malignancy.  I discussed this with patient and patient's wife. Chronic combined systolic and diastolic CHF appears compensated.  Patient is on [place]. A. fib presently rate controlled present in sinus rhythm on amiodarone Toprol-XL and Xarelto. Chronic kidney disease stage III creatinine appears to be at baseline.  Follow metabolic panel. Hyponatremia cause not clear follow metabolic panel closely. Hyperlipidemia on statins.  COVID test is pending.   Addendum -patient's COVID test came back as positive.  Discussed with patient about the COVID test results.  Patient agrees to getting remdesivir and steroids.  Patient is already on steroids.  We will check inflammatory markers and  procalcitonin.  For now we will also continue empiric antibiotics and if procalcitonin is negative may discontinue antibiotics.   Since patient has acute respiratory failure with hypoxia with pneumonia and COVID 19 infection will need close monitoring for any further worsening and inpatient status.   DVT prophylaxis: Xarelto. Code Status: Full code. Family Communication: Patient's wife at the bedside. Disposition Plan: Home. Consults called: None. Admission status: Inpatient.   Rise Patience MD [facility] Pager [phone].  If 7PM-7AM, please contact night-coverage [email or web address] Password [facility]  05/02/2021, 9:49 PM

## 2021-05-02 NOTE — Progress Notes (Signed)
No show

## 2021-05-02 NOTE — Progress Notes (Addendum)
Pt being followed by [facility] for Sepsis protocol. 

## 2021-05-02 NOTE — Assessment & Plan Note (Addendum)
-   Increased shortness of breath x 1 week with associated congested cough and wheezing. He had audible wheezing through both lungs on exam. He received duoneb x 1 in office with some improvement in dyspnea symptoms. CXR today showed moderate-sized airspace opacity within the right lower lobe and right infrahilar region, compatible with pneumonia. Advised patient needs to go to ED for evaluation. Patient declined EMS transport, wife will bring him to [facility] ED.

## 2021-05-02 NOTE — Progress Notes (Signed)
@Patient  ID: Mario Proctor, male    DOB: [birth year], 80 y.o.   MRN: [id number]  Chief Complaint  Patient presents with   Follow-up    Patient says he can't breathe. Says it's been going on for about a week and a half.    Referring provider: Tamsen Roers, MD  HPI: 80 year old male, current everyday smoker (57-pack-year history).  Past medical history significant for COPD Gold A, dyspnea on exertion, A. fib, cardiomyopathy, chronic combined systolic and diastolic heart failure.  Patient of Dr. Vaughan Browner, last seen on 07/21/2019.  He saw pulmonary nurse practitioner for lung cancer screening on 08/16/2019.  Previous [facility] pulmonary encounter: Mr. Bastin is a 80 year old with active smoking history, allergies, COPD GOLD A (CAT score 9, no exacerbations), atrial fibrillation.  He has symptoms of dyspnea on exertion for the past several years. He gets short of breath with excessive physical activity but is able to carry out his normal daily activities. He is able to climb a flight of stairs, walk to his mailbox. 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 He has chronic cough with minimal sputum production. No wheezing, hemoptysis, fevers, chills. He had been on albuterol many years ago and is currently not using any inhaler medication.   He was evaluated by Dr. Einar Gip for frequent PVCs. He was unable to complete the treadmill stress test due to marked dyspnea and PVCs. An echocardiogram and a pharmacologic stress test has been done. Cardiac cath shows nonischemic cardiomyopathy. Started on Xarelto by Dr. Einar Gip for new onset atrial fibrillation.  Pets: Cats, dog. No birds, exotic pets, farm animals Occupation: Worked as a Geophysicist/field seismologist for [employer]. Exposures: Has some exposure to lead in his line of work. Denies exposure to asbestos, dust, etc., Smoking history: 54-pack-year smoking history. Continues to smoke 1 pack per day  07/21/19 Bevespi changed to Trelegy inhaler due to elevated  peripheral eosinophils.  He likes the Trelegy and feels that it helps a lot but is using it only intermittently due to cost of medication Continues to have dyspnea on exertion, chronic cough.   05/14/2020 Presents today for an acute visit, reports increased shortness of breath and fatigue.  Covid negative week of Christmas. He is doing well today, no real acute complaints. He reports having no energy several weeks ago. This has improved. His symptoms felt similar to when he had pneumonia in the past. He did not have a cough, he may have had some sinus symptoms but again this has resolved. His breathing is baseline for him. He is on Trelegy Elipta 100 and states that this helps a lot. He uses albuterol on rare occasion 1-2 times a day. Reports not needed SABA as much as he has in the past. He has not required it today. Denies f/c/s, significant shortness of breath, cough, chest tightness, wheezing, N/V/D.   LDCT on 08/16/2019 showed lung RADS 2, benign appearance of behavior.  There was a exophytic 1.6 cm indeterminate upper right renal cortical lesion, renal cell carcinoma cannot be excluded.  Patient was ordered for dedicated CT abdomen with and without contrast which ended up showing benign proteinaceous or hemorrhagic cyst.  No further routine follow-up required.   Patient had echocardiogram in November of this year that showed EF 20 to 81%, grade 1 diastolic dysfunction, dilated cardiomyopathy and moderate pulmonary hypertension.  He is on spironolactone 12.5 mg daily.  Patient saw Dr. Einar Gip with cardiology on 03/07/2020.  He is on Entresto 97/103,  metoprolol succinate 50 mg daily and anticoagulated with Xarelto 20 mg daily.   05/02/2021  Interim History Patient presents today for acute OV/shortness of breath. He is alone during today's visit. He lives with his wife. Patient reports increased shortness of breath x 1 week. Associated wheezing and congested cough. He had televisit with PCP and was  prescribed prednisone which helped some but did not resolved his shortness of breath.  O2 dropped 83-85% walking 1 lap at slow pace; O2 96% on 2L at rest. CXR today showed moderate-sized airspace opacity within the right lower lobe and right infrahilar region, compatible with pneumonia. Advised patient needs to go to ED for evaluation. Patient declined EMS transport, wife will bring him to [facility] ED.    Pulmonary testing: PFTs 01/07/2017 - FVC 3.96 (89%), FEV1 1.86 (58%), ratio 47, TLC 113%, DLCO cor 17.71 (52%)/ moderate obstructive airways disease   Cardiac testing: Echocardiogram showed severely depressed LV systolic function with EF 20 to 25%, dilated cardiomyopathy, mild left ventricular hypertrophy, grade 1 diastolic dysfunction, moderate pulmonary hypertension  No Known Allergies  Immunization History  Administered Date(s) Administered   Fluad Quad(high Dose 65+) 02/23/2019   Influenza, High Dose Seasonal PF 02/15/2016, 04/16/2017, 04/04/2018, 03/11/2020   PFIZER(Purple Top)SARS-COV-2 Vaccination 06/15/2019, 07/10/2019, 04/02/2020   Pneumococcal-Unspecified 10/16/2010   Tdap 08/29/2020    Past Medical History:  Diagnosis Date   A-fib [facility])    Allergy    CHF (congestive heart failure) (HCC)    Dupuytren contracture    right sm finger   Myocardial infarct ([place]) 2019   Myocardial infarction ([place])    Small bowel obstruction (HCC)     Tobacco History: Social History   Tobacco Use  Smoking Status Every Day   Packs/day: 0.50   Years: 50.00   Pack years: 25.00   Types: Cigarettes  Smokeless Tobacco Never  Tobacco Comments   currently smoking .5ppd as of 05/14/20   Ready to quit: Not Answered Counseling given: Not Answered Tobacco comments: currently smoking .5ppd as of 05/14/20   Outpatient Medications Prior to Visit  Medication Sig Dispense Refill   albuterol (ACCUNEB) 1.25 MG/3ML nebulizer solution Take 1 ampule by nebulization every 4 (four) hours as needed for  wheezing or shortness of breath.     albuterol (VENTOLIN HFA) 108 (90 Base) MCG/ACT inhaler Inhale 2 puffs into the lungs every 6 (six) hours as needed.     albuterol (VENTOLIN HFA) 108 (90 Base) MCG/ACT inhaler INHALE 2 PUFF BY MOUTH EVERY 6 HOURS AS NEEDED FOR WHEEZE OR SHORTNESS OF BREATH 18 each 5   amiodarone (PACERONE) 100 MG tablet Take 1 tablet (100 mg total) by mouth daily. 90 tablet 3   budesonide (PULMICORT) 0.5 MG/2ML nebulizer solution Take 0.5 mg by nebulization daily as needed (asthma).     dapagliflozin propanediol (FARXIGA) 10 MG TABS tablet Take 1 tablet (10 mg total) by mouth daily before breakfast. 30 tablet 3   diphenhydrAMINE (BENADRYL) 25 MG tablet Take 25 mg by mouth every 6 (six) hours as needed.     ENTRESTO 97-103 MG TAKE 1 TABLET BY MOUTH EVERY DAY 60 tablet 6   metoprolol succinate (TOPROL-XL) 25 MG 24 hr tablet Take 1 tablet (25 mg total) by mouth daily. 90 tablet 3   rosuvastatin (CRESTOR) 5 MG tablet TAKE 1 TABLET BY MOUTH EVERY DAY 90 tablet 3   TRELEGY ELLIPTA 100-62.5-25 MCG/INH AEPB TAKE 1 PUFF BY MOUTH EVERY DAY 60 each 3   triamcinolone cream (KENALOG) 0.1 %  Apply 1 application topically 2 (two) times daily.     XARELTO 20 MG TABS tablet TAKE 1 TABLET BY MOUTH EVERY DAY WITH SUPPER 90 tablet 3   No facility-administered medications prior to visit.      Review of Systems  Review of Systems  Constitutional: Negative.   HENT: Negative.    Respiratory:  Positive for cough, shortness of breath and wheezing. Negative for chest tightness.   Cardiovascular: Negative.     Physical Exam  BP 134/88 (BP Location: Right Arm, Patient Position: Sitting, Cuff Size: Normal)    Pulse 72    Temp 98.1 F (36.7 C) (Oral)    Ht 5\' 11"  (1.803 m)    Wt 168 lb 3.2 oz (76.3 kg)    SpO2 (!) 83%    BMI 23.46 kg/m  Physical Exam Constitutional:      Appearance: Normal appearance.  HENT:     Head: Normocephalic and atraumatic.     Mouth/Throat:     Mouth: Mucous  membranes are moist.     Pharynx: Oropharynx is clear.  Cardiovascular:     Rate and Rhythm: Normal rate and regular rhythm.  Pulmonary:     Effort: Pulmonary effort is normal.     Breath sounds: Wheezing present. No rhonchi or rales.     Comments: Increased workload to breath  Musculoskeletal:        General: Normal range of motion.  Skin:    General: Skin is warm and dry.  Neurological:     General: No focal deficit present.     Mental Status: He is alert. Mental status is at baseline.  Psychiatric:        Mood and Affect: Mood normal.        Behavior: Behavior normal.        Thought Content: Thought content normal.        Judgment: Judgment normal.     Lab Results:  CBC    Component Value Date/Time   WBC 8.7 08/31/2020 0101   RBC 4.38 08/31/2020 0101   HGB 14.0 08/31/2020 0101   HGB 14.5 06/20/2020 0949   HCT 41.8 08/31/2020 0101   HCT 42.2 06/20/2020 0949   PLT 212 08/31/2020 0101   PLT 243 06/20/2020 0949   MCV 95.4 08/31/2020 0101   MCV 91 06/20/2020 0949   MCH 32.0 08/31/2020 0101   MCHC 33.5 08/31/2020 0101   RDW 12.5 08/31/2020 0101   RDW 12.0 06/20/2020 0949   LYMPHSABS 2.1 08/29/2020 2225   LYMPHSABS 2.0 09/01/2018 0803   MONOABS 0.7 08/29/2020 2225   EOSABS 0.2 08/29/2020 2225   EOSABS 0.6 (H) 09/01/2018 0803   BASOSABS 0.1 08/29/2020 2225   BASOSABS 0.1 09/01/2018 0803    BMET    Component Value Date/Time   NA 137 04/07/2021 1314   K 5.1 04/07/2021 1314   CL 98 04/07/2021 1314   CO2 23 04/07/2021 1314   GLUCOSE 92 04/07/2021 1314   GLUCOSE 99 08/31/2020 0101   BUN 20 04/07/2021 1314   CREATININE 1.54 (H) 04/07/2021 1314   CALCIUM 8.6 04/07/2021 1314   GFRNONAA 46 (L) 08/31/2020 0101   GFRAA 45 (L) 07/04/2020 0920    BNP    Component Value Date/Time   BNP 1,256.6 (H) 07/23/2020 1052    ProBNP    Component Value Date/Time   PROBNP 1,629 (H) 04/24/2021 1321   PROBNP 900.0 (H) 05/14/2020 1137    Imaging: DG Chest 2  View  Result Date:  05/02/2021 CLINICAL DATA:  Provided history: Shortness of breath for 1 week, new hypoxia EXAM: CHEST - 2 VIEW COMPARISON:  Chest CT 10/14/2020.  Chest radiographs 05/14/2020 FINDINGS: An ovoid electronic device projects over the left chest. This may reflect a loop recorder or leadless pacer device. Heart size within normal limits. Aortic atherosclerosis. Moderate-sized airspace opacity within the right lower lobe and right infrahilar region. Additionally, there is suggestion of a subtle nodular airspace opacity within the left upper lobe. No evidence of pleural effusion or pneumothorax. No acute bony abnormality identified. IMPRESSION: Moderate-sized airspace opacity within the right lower lobe and right infrahilar region, compatible with pneumonia in the appropriate clinical setting. Followup PA and lateral chest radiographs are recommended in 3-4 weeks following trial of antibiotic therapy to ensure resolution and exclude underlying malignancy. Suggestion of a subtle nodular airspace opacity within the left upper lobe. This may also be infectious/inflammatory in etiology. However, if this finding persists at time of radiographic follow-up, a chest CT is recommended for further evaluation. Aortic Atherosclerosis (ICD10-I70.0). Electronically Signed   By: Kellie Simmering D.O.   On: 05/02/2021 11:13     Assessment & Plan:   Pneumonia - Increased shortness of breath x 1 week with associated congested cough and wheezing. He had audible wheezing through both lungs on exam. He received duoneb x 1 in office with some improvement in dyspnea symptoms. CXR today showed moderate-sized airspace opacity within the right lower lobe and right infrahilar region, compatible with pneumonia. Advised patient needs to go to ED for evaluation. Patient declined EMS transport, wife will bring him to [facility] ED.  Acute respiratory failure (HCC) - New oxygen requirement secondary to CAP. O2 was 83-85% RA with  ambulation, requiring 2L supplemental oxygen to maintain >90%. He was given loaner oxygen tank from [employer] and advised to wear 2L oxygen continuously. Oxygen order will be determined by ED/upon discharge if needed to continue.   40 mins spent on case: >50% face to face   Martyn Ehrich, NP 05/02/2021

## 2021-05-02 NOTE — Patient Instructions (Addendum)
Please present to [facility], CXR showed moderate right sided pneumonia and you have new oxygen requirements  We advised you go by EMS but since you have declined this we will send you home with an oxygen tank. Please wear 2L oxygen continuously.   Follow-up: Please call for post hospital follow-up with [facility] NP

## 2021-05-02 NOTE — ED Provider Notes (Signed)
Emergency Medicine Provider Triage Evaluation Note  Mario Proctor , a 80 y.o. male  was evaluated in triage.  Pt complains of worsening shortness of breath over the last 2 months.  Patient has history of COPD, heart failure, was being seen by his pulmonologist today for evaluation of worsening shortness of breath over the last week.  Patient reports that he had also seen his PCP and recently placed on a prednisone prescription for presumed acute bronchitis.  Patient with no improvement of shortness of breath after prednisone.  At pulmonologist today, questionable patchy opacity in the right lower lobe was seen, suspicion for pneumonia.  Patient denies fever, chills, nausea, vomiting.  Patient does endorse increased use of his nebulizer, and COPD medication at home.  Patient on no oxygen at home, currently requiring 2 L nasal cannula to maintain adequate oxygen saturation.  83% at pulmonologist earlier today.  Review of Systems  Positive: Shortness of breath Negative: Chest pain, fever, nausea, vomiting, abdominal pain  Physical Exam  BP (!) 164/81 (BP Location: Left Arm)    Pulse 66    Temp 98.3 F (36.8 C) (Oral)    Resp (!) 22    SpO2 96%  Gen:   Awake, no distress   Resp:  Normal effort, diffuse wheezing on left, questionable consolidation noted right lower lobe MSK:   Moves extremities without difficulty  Other:  No ttp Abdomen  Medical Decision Making  Medically screening exam initiated at 2:05 PM.  Appropriate orders placed.  Mario Proctor was informed that the remainder of the evaluation will be completed by another provider, this initial triage assessment does not replace that evaluation, and the importance of remaining in the ED until their evaluation is complete.  Shortness of breath, pneumonia   Dorien Chihuahua 05/02/21 1408    Carmin Muskrat, MD 05/02/21 [phone]

## 2021-05-02 NOTE — ED Notes (Signed)
Only able to get 1 blue top for 2nd culture

## 2021-05-02 NOTE — Assessment & Plan Note (Addendum)
-   New oxygen requirement secondary to CAP. O2 was 83-85% RA with ambulation, requiring 2L supplemental oxygen to maintain >90%. He was given loaner oxygen tank from [employer] and advised to wear 2L oxygen continuously. Oxygen order will be determined by ED/upon discharge if needed to continue.

## 2021-05-02 NOTE — ED Provider Notes (Signed)
[facility] EMERGENCY DEPARTMENT Provider Note   CSN: [id number] Arrival date & time: 05/02/21  1333     History Chief Complaint  Patient presents with   Shortness of Breath   Pneumonia    Mario Proctor is a 80 y.o. male.   Shortness of Breath Pneumonia Associated symptoms include shortness of breath.   This patient is a 80 year old male with a history of atrial fibrillation prior history of COPD on albuterol treatments, history of cardiac ischemia, he currently smokes about half a pack of cigarettes per day and takes albuterol.  Over the last 10 days he has had increasing shortness of breath and coughing and was found to have COPD exacerbation placed on prednisone.  He returned to the pulmonary office today and was evaluated with an x-ray which was concerning for possible right-sided pneumonia.  Due to his oxygen requirement at 83 to 85%  on room air while ambulating, 96% on 2 L at rest, the patient is not on oxygen at home.  And increasing work of breathing with dyspnea he was transported to the emergency department for evaluation and admission.  The patient's symptoms have worsened, they have become severe, they are not associated with fevers or chills.  He has increasing coughing, no productive phlegm.  Past Medical History:  Diagnosis Date   A-fib [facility])    Allergy    CHF (congestive heart failure) (HCC)    Dupuytren contracture    right sm finger   Myocardial infarct ([place]) 2019   Myocardial infarction [facility])    Small bowel obstruction [facility])     Patient Active Problem List   Diagnosis Date Noted   Pneumonia 05/02/2021   Acute respiratory failure ([place]) 05/02/2021   Research subject: LUX-Sx TRENDS study (loop implantation for heart failure sensor) 04/24/2021 04/24/2021   Syncope 08/30/2020   Acute kidney injury superimposed on CKD ([place]) 08/30/2020   Chronic congestive heart failure ([place]) 08/30/2020   Laceration of head 08/30/2020   COPD (chronic  obstructive pulmonary disease) ([place]) 08/30/2020   Paroxysmal atrial fibrillation ([place]) 08/30/2020   COPD, group A, by GOLD 2017 classification ([place]) 05/14/2020   A-fib ([place])    Nonischemic cardiomyopathy ([place]) 09/07/2018   Chronic combined systolic and diastolic CHF (congestive heart failure) ([place]) 09/07/2018   Chronic atrial fibrillation ([place]) 09/07/2018   Tobacco use disorder 09/07/2018   Dyspnea on exertion 11/02/2016   Cardiomyopathy, ischemic 11/02/2016    Past Surgical History:  Procedure Laterality Date   CARDIOVERSION N/A 07/30/2020   Procedure: CARDIOVERSION;  Surgeon: [name], MD;  Location: [place];  Service: Cardiovascular;  Laterality: N/A;   COLON SURGERY     bowel blockage   DUPUYTREN CONTRACTURE RELEASE Left 2011   FASCIECTOMY Right 05/02/2015   Procedure: FASCIECTOMY RIGHT SMALL FINGER;  Surgeon: Daryll Brod, MD;  Location: [place];  Service: Orthopedics;  Laterality: Right;  axillary block in preop   LEFT HEART CATH AND CORONARY ANGIOGRAPHY N/A 11/03/2016   Procedure: Left Heart Cath and Coronary Angiography;  Surgeon: [name], MD;  Location: [place] CV LAB;  Service: Cardiovascular;  Laterality: N/A;   TONSILLECTOMY     TYMPANOPLASTY Right        Family History  Problem Relation Age of Onset   Cancer Brother        "in heart and lungs"     Social History   Tobacco Use   Smoking status: Every Day    Packs/day: 0.50  Years: 50.00    Pack years: 25.00    Types: Cigarettes   Smokeless tobacco: Never   Tobacco comments:    currently smoking .5ppd as of 05/14/20  Vaping Use   Vaping Use: Never used  Substance Use Topics   Alcohol use: Yes    Alcohol/week: 6.0 standard drinks    Types: 6 Cans of beer per week    Comment: social   Drug use: No    Home Medications Prior to Admission medications   Medication Sig Start Date End Date Taking? Authorizing Provider  albuterol (ACCUNEB) 1.25 MG/3ML nebulizer solution Take 1  ampule by nebulization every 4 (four) hours as needed for wheezing or shortness of breath. 06/28/18   [provider]  albuterol (VENTOLIN HFA) 108 (90 Base) MCG/ACT inhaler Inhale 2 puffs into the lungs every 6 (six) hours as needed. 08/02/20   [provider]  albuterol (VENTOLIN HFA) 108 (90 Base) MCG/ACT inhaler INHALE 2 PUFF BY MOUTH EVERY 6 HOURS AS NEEDED FOR WHEEZE OR SHORTNESS OF BREATH 02/20/21   Mannam, Praveen, MD  amiodarone (PACERONE) 100 MG tablet Take 1 tablet (100 mg total) by mouth daily. 02/12/21   Cantwell, Celeste C, PA-C  budesonide (PULMICORT) 0.5 MG/2ML nebulizer solution Take 0.5 mg by nebulization daily as needed (asthma). 10/25/17   [provider]  dapagliflozin propanediol (FARXIGA) 10 MG TABS tablet Take 1 tablet (10 mg total) by mouth daily before breakfast. 03/11/21   Cantwell, Celeste C, PA-C  diphenhydrAMINE (BENADRYL) 25 MG tablet Take 25 mg by mouth every 6 (six) hours as needed.    [provider]  ENTRESTO 97-103 MG TAKE 1 TABLET BY MOUTH EVERY DAY 04/21/21   Cantwell, Celeste C, PA-C  metoprolol succinate (TOPROL-XL) 25 MG 24 hr tablet Take 1 tablet (25 mg total) by mouth daily. 03/26/21 03/21/22  Cantwell, Celeste C, PA-C  rosuvastatin (CRESTOR) 5 MG tablet TAKE 1 TABLET BY MOUTH EVERY DAY 08/26/20   Cantwell, Celeste C, PA-C  TRELEGY ELLIPTA 100-62.5-25 MCG/INH AEPB TAKE 1 PUFF BY MOUTH EVERY DAY 01/28/21   Mannam, Praveen, MD  triamcinolone cream (KENALOG) 0.1 % Apply 1 application topically 2 (two) times daily. 08/07/20   [provider]  XARELTO 20 MG TABS tablet TAKE 1 TABLET BY MOUTH EVERY DAY WITH SUPPER 09/30/20   Cantwell, Celeste C, PA-C    Allergies    Patient has no known allergies.  Review of Systems   Review of Systems  Respiratory:  Positive for shortness of breath.   All other systems reviewed and are negative.  Physical Exam Updated Vital Signs BP (!) 156/71    Pulse 100    Temp 98.3 F (36.8 C)  (Oral)    Resp 18    SpO2 99%   Physical Exam Vitals and nursing note reviewed.  Constitutional:      General: He is in acute distress.     Appearance: He is well-developed.  HENT:     Head: Normocephalic and atraumatic.     Mouth/Throat:     Pharynx: No oropharyngeal exudate.  Eyes:     General: No scleral icterus.       Right eye: No discharge.        Left eye: No discharge.     Conjunctiva/sclera: Conjunctivae normal.     Pupils: Pupils are equal, round, and reactive to light.  Neck:     Thyroid: No thyromegaly.     Vascular: No JVD.  Cardiovascular:  Rate and Rhythm: Regular rhythm. Tachycardia present.     Heart sounds: Normal heart sounds. No murmur heard.   No friction rub. No gallop.  Pulmonary:     Effort: Tachypnea, accessory muscle usage and respiratory distress present.     Breath sounds: Wheezing, rhonchi and rales present.  Abdominal:     General: Bowel sounds are normal. There is no distension.     Palpations: Abdomen is soft. There is no mass.     Tenderness: There is no abdominal tenderness.  Musculoskeletal:        General: No tenderness. Normal range of motion.     Cervical back: Normal range of motion and neck supple.     Right lower leg: No edema.     Left lower leg: No edema.  Lymphadenopathy:     Cervical: No cervical adenopathy.  Skin:    General: Skin is warm and dry.     Findings: No erythema or rash.  Neurological:     General: No focal deficit present.     Mental Status: He is alert.     Coordination: Coordination normal.  Psychiatric:        Behavior: Behavior normal.    ED Results / Procedures / Treatments   Labs (all labs ordered are listed, but only abnormal results are displayed) Labs Reviewed  CBC WITH DIFFERENTIAL/PLATELET - Abnormal; Notable for the following components:      Result Value   WBC 16.4 (*)    Neutro Abs 14.9 (*)    Lymphs Abs 0.5 (*)    Abs Immature Granulocytes 0.31 (*)    All other components within  normal limits  COMPREHENSIVE METABOLIC PANEL - Abnormal; Notable for the following components:   Sodium 130 (*)    Chloride 95 (*)    Glucose, Bld 170 (*)    BUN 24 (*)    Creatinine, Ser 1.67 (*)    Calcium 8.4 (*)    Total Protein 6.4 (*)    Albumin 2.8 (*)    ALT 46 (*)    GFR, Estimated 41 (*)    All other components within normal limits  RESP PANEL BY RT-PCR (FLU A&B, COVID) ARPGX2  CULTURE, BLOOD (ROUTINE X 2)  CULTURE, BLOOD (ROUTINE X 2)  URINE CULTURE  LACTIC ACID, PLASMA  LACTIC ACID, PLASMA  PROTIME-INR  APTT  URINALYSIS, ROUTINE W REFLEX MICROSCOPIC    EKG None  Radiology DG Chest 2 View  Result Date: 05/02/2021 CLINICAL DATA:  Provided history: Shortness of breath for 1 week, new hypoxia EXAM: CHEST - 2 VIEW COMPARISON:  Chest CT 10/14/2020.  Chest radiographs 05/14/2020 FINDINGS: An ovoid electronic device projects over the left chest. This may reflect a loop recorder or leadless pacer device. Heart size within normal limits. Aortic atherosclerosis. Moderate-sized airspace opacity within the right lower lobe and right infrahilar region. Additionally, there is suggestion of a subtle nodular airspace opacity within the left upper lobe. No evidence of pleural effusion or pneumothorax. No acute bony abnormality identified. IMPRESSION: Moderate-sized airspace opacity within the right lower lobe and right infrahilar region, compatible with pneumonia in the appropriate clinical setting. Followup PA and lateral chest radiographs are recommended in 3-4 weeks following trial of antibiotic therapy to ensure resolution and exclude underlying malignancy. Suggestion of a subtle nodular airspace opacity within the left upper lobe. This may also be infectious/inflammatory in etiology. However, if this finding persists at time of radiographic follow-up, a chest CT is recommended for further evaluation. Aortic  Atherosclerosis (ICD10-I70.0). Electronically Signed   By: Kellie Simmering D.O.    On: 05/02/2021 11:13   CT Chest Wo Contrast  Result Date: 05/02/2021 CLINICAL DATA:  Respiratory illness, abnormal chest radiograph, hypoxia EXAM: CT CHEST WITHOUT CONTRAST TECHNIQUE: Multidetector CT imaging of the chest was performed following the standard protocol without IV contrast. COMPARISON:  Chest radiographs dated 05/02/2021. CT chest dated 10/14/2020. FINDINGS: Cardiovascular: Heart is normal in size.  No pericardial effusion. No evidence of thoracic aortic aneurysm. Atherosclerotic calcifications of the arch. Mild coronary atherosclerosis of the LAD and right coronary artery. Mediastinum/Nodes: Small mediastinal lymph nodes, including a 9 mm short axis AP window node (series 3/image 32), likely reactive. Visualized thyroid is unremarkable. Lungs/Pleura: 2.7 x 4.2 cm irregular/spiculated masslike opacity in the posterior right lower lobe (series 4/image 101). Mild associated hyperdensity within the lesion (series 3/image 102). Adjacent debris within right lower lobe bronchi (series 4/image 101). Additional 1.4 x 2.6 cm irregular nodular opacity in the posterior right upper lobe (series 4/image 35) and 7 x 12 mm irregular nodular opacity in the posterior left upper lobe (series 4/image 61). Additional mild irregular linear/nodular opacity in the right upper lobe (series 4/image 45). Despite the masslike appearance of the dominant lesion, the overall appearance, distribution, and ancillary findings favor multifocal infection/pneumonia, possibly on the basis of aspiration. However, this does warrant follow-up. Moderate centrilobular and paraseptal emphysematous changes, upper lung predominant. No pleural effusion or pneumothorax. Upper Abdomen: Visualized upper abdomen is notable for vascular calcifications and a 2.4 cm right upper pole renal cyst. Musculoskeletal: Mild superior endplate compression fracture deformity at L1, new from the prior. IMPRESSION: Multifocal patchy opacities including a 4.2 cm  irregular/spiculated masslike opacity in the posterior right lower lobe. Overall appearance/distribution favors multifocal infection/pneumonia, possibly on the basis of aspiration. However, follow-up CT chest is suggested in 6-12 weeks (after appropriate antimicrobial therapy) to document improvement/resolution. Small mediastinal lymph nodes, likely reactive. Mild superior endplate compression fracture deformity at L1, new from the prior. Aortic Atherosclerosis (ICD10-I70.0) and Emphysema (ICD10-J43.9). Electronically Signed   By: Julian Hy M.D.   On: 05/02/2021 15:04    Procedures .Critical Care Performed by: Noemi Chapel, MD Authorized by: Noemi Chapel, MD   Critical care provider statement:    Critical care time (minutes):  30   Critical care was necessary to treat or prevent imminent or life-threatening deterioration of the following conditions:  Respiratory failure and sepsis   Critical care was time spent personally by me on the following activities:  Development of treatment plan with patient or surrogate, discussions with consultants, evaluation of patient's response to treatment, examination of patient, ordering and review of laboratory studies, ordering and review of radiographic studies, ordering and performing treatments and interventions, pulse oximetry, re-evaluation of patient's condition and review of old charts Comments:         Medications Ordered in ED Medications  albuterol (VENTOLIN HFA) 108 (90 Base) MCG/ACT inhaler 2 puff (has no administration in time range)  lactated ringers infusion (has no administration in time range)  cefTRIAXone (ROCEPHIN) 2 g in sodium chloride 0.9 % 100 mL IVPB (has no administration in time range)  azithromycin (ZITHROMAX) 500 mg in sodium chloride 0.9 % 250 mL IVPB (has no administration in time range)    ED Course  I have reviewed the triage vital signs and the nursing notes.  Pertinent labs & imaging results that were available  during my care of the patient were reviewed by me and considered in  my medical decision making (see chart for details).    MDM Rules/Calculators/A&P                          This patient presents to the ED for concern of acute hypoxic respiratory failure.  His increasing shortness of breath is very concerning.   this involves an extensive number of treatment options, and is a complaint that carries with it a high risk of complications and morbidity.  The differential diagnosis includes pulmonary embolism, cancer, aspiration, multifocal pneumonia, fungal infections, cavitary lesions   Additional history obtained:  Additional history obtained from spouse at the bedside as well as the electronic medical record.  The office visit from today was evaluated, I also reviewed the patient's prior CT scan imaging from June of this year which did not show any signs of lesions consistent with cancer External records from outside source obtained and reviewed including CT scans and prior pulmonary visits to the clinic including today   Lab Tests:  I Ordered, reviewed, and interpreted labs.  The pertinent results include: Leukocytosis   Imaging Studies ordered:  I ordered imaging studies including CT scan of the chest I independently visualized and interpreted imaging which showed multifocal pneumonia and a large spiculated lesion in the right side which is what was seen on x-ray I agree with the radiologist interpretation   Cardiac Monitoring:  The patient was maintained on a cardiac monitor.  I personally viewed and interpreted the cardiac monitored which showed an underlying rhythm of: Sinus tachycardia   Medicines ordered and prescription drug management:  I ordered medication including antibiotics for infection, albuterol treatments, steroids, supplemental oxygen Reevaluation of the patient after these medicines showed that the patient improved I have reviewed the patients home medicines and  have made adjustments as needed   Critical Interventions:  Supplemental oxygen Steroids Bronchodilator therapy Antibiotics Activating code sepsis due to the patient having leukocytosis hypoxia and increased work of breathing and multifocal pneumonia   Consultations Obtained:  I requested consultation with the hospitalist,  and discussed lab and imaging findings as well as pertinent plan - they recommend: admission - Dr. Hal Hope to admit   Reevaluation:  After the interventions noted above, I reevaluated the patient and found that they have :improved   Dispostion:  After consideration of the diagnostic results and the patients response to treatment feel that the patent would benefit from admission.       Final Clinical Impression(s) / ED Diagnoses Final diagnoses:  Acute respiratory failure with hypoxia ([place])  Sepsis without acute organ dysfunction, due to unspecified organism [facility])  Multifocal pneumonia     Noemi Chapel, MD 05/02/21 1949

## 2021-05-03 ENCOUNTER — Encounter (HOSPITAL_COMMUNITY): Payer: Self-pay | Admitting: Internal Medicine

## 2021-05-03 ENCOUNTER — Inpatient Hospital Stay (HOSPITAL_COMMUNITY): Payer: Medicare HMO

## 2021-05-03 DIAGNOSIS — E785 Hyperlipidemia, unspecified: Secondary | ICD-10-CM | POA: Diagnosis not present

## 2021-05-03 DIAGNOSIS — I482 Chronic atrial fibrillation, unspecified: Secondary | ICD-10-CM | POA: Diagnosis not present

## 2021-05-03 DIAGNOSIS — J9601 Acute respiratory failure with hypoxia: Secondary | ICD-10-CM | POA: Diagnosis not present

## 2021-05-03 DIAGNOSIS — I255 Ischemic cardiomyopathy: Secondary | ICD-10-CM | POA: Diagnosis not present

## 2021-05-03 DIAGNOSIS — Z7984 Long term (current) use of oral hypoglycemic drugs: Secondary | ICD-10-CM | POA: Diagnosis not present

## 2021-05-03 DIAGNOSIS — U071 COVID-19: Secondary | ICD-10-CM | POA: Diagnosis not present

## 2021-05-03 DIAGNOSIS — Z7901 Long term (current) use of anticoagulants: Secondary | ICD-10-CM | POA: Diagnosis not present

## 2021-05-03 DIAGNOSIS — I480 Paroxysmal atrial fibrillation: Secondary | ICD-10-CM | POA: Diagnosis not present

## 2021-05-03 DIAGNOSIS — F17210 Nicotine dependence, cigarettes, uncomplicated: Secondary | ICD-10-CM | POA: Diagnosis present

## 2021-05-03 DIAGNOSIS — R0602 Shortness of breath: Secondary | ICD-10-CM | POA: Diagnosis not present

## 2021-05-03 DIAGNOSIS — N1832 Chronic kidney disease, stage 3b: Secondary | ICD-10-CM | POA: Diagnosis not present

## 2021-05-03 DIAGNOSIS — R69 Illness, unspecified: Secondary | ICD-10-CM | POA: Diagnosis not present

## 2021-05-03 DIAGNOSIS — Z7951 Long term (current) use of inhaled steroids: Secondary | ICD-10-CM | POA: Diagnosis not present

## 2021-05-03 DIAGNOSIS — I5043 Acute on chronic combined systolic (congestive) and diastolic (congestive) heart failure: Secondary | ICD-10-CM | POA: Diagnosis not present

## 2021-05-03 DIAGNOSIS — I5042 Chronic combined systolic (congestive) and diastolic (congestive) heart failure: Secondary | ICD-10-CM | POA: Diagnosis not present

## 2021-05-03 DIAGNOSIS — J189 Pneumonia, unspecified organism: Secondary | ICD-10-CM | POA: Diagnosis not present

## 2021-05-03 DIAGNOSIS — J1282 Pneumonia due to coronavirus disease 2019: Secondary | ICD-10-CM | POA: Diagnosis not present

## 2021-05-03 DIAGNOSIS — I4819 Other persistent atrial fibrillation: Secondary | ICD-10-CM

## 2021-05-03 DIAGNOSIS — Z79899 Other long term (current) drug therapy: Secondary | ICD-10-CM | POA: Diagnosis not present

## 2021-05-03 LAB — PROCALCITONIN: Procalcitonin: <0.1 ng/mL

## 2021-05-03 LAB — BASIC METABOLIC PANEL
Anion gap: 8 (ref 5–15)
BUN: 26 mg/dL — ABNORMAL HIGH (ref 8–23)
CO2: 28 mmol/L (ref 22–32)
Calcium: 8.3 mg/dL — ABNORMAL LOW (ref 8.9–10.3)
Chloride: 98 mmol/L (ref 98–111)
Creatinine, Ser: 1.44 mg/dL — ABNORMAL HIGH (ref 0.61–1.24)
GFR, Estimated: 49 mL/min — ABNORMAL LOW (ref >60)
Glucose, Bld: 180 mg/dL — ABNORMAL HIGH (ref 70–99)
Potassium: 4.6 mmol/L (ref 3.5–5.1)
Sodium: 134 mmol/L — ABNORMAL LOW (ref 135–145)

## 2021-05-03 LAB — CBC
HCT: 40.1 % (ref 39.0–52.0)
Hemoglobin: 12.9 g/dL — ABNORMAL LOW (ref 13.0–17.0)
MCH: 30.6 pg (ref 26.0–34.0)
MCHC: 32.2 g/dL (ref 30.0–36.0)
MCV: 95.2 fL (ref 80.0–100.0)
Platelets: 268 10*3/uL (ref 150–400)
RBC: 4.21 MIL/uL — ABNORMAL LOW (ref 4.22–5.81)
RDW: 13.4 % (ref 11.5–15.5)
WBC: 13.3 10*3/uL — ABNORMAL HIGH (ref 4.0–10.5)
nRBC: 0 % (ref 0.0–0.2)

## 2021-05-03 LAB — C-REACTIVE PROTEIN: CRP: 1.8 mg/dL — ABNORMAL HIGH (ref <1.0)

## 2021-05-03 LAB — MRSA NEXT GEN BY PCR, NASAL: MRSA by PCR Next Gen: NOT DETECTED

## 2021-05-03 LAB — BRAIN NATRIURETIC PEPTIDE: B Natriuretic Peptide: 1313.9 pg/mL — ABNORMAL HIGH (ref 0.0–100.0)

## 2021-05-03 LAB — D-DIMER, QUANTITATIVE: D-Dimer, Quant: 0.32 ug/mL-FEU (ref 0.00–0.50)

## 2021-05-03 LAB — LACTIC ACID, PLASMA: Lactic Acid, Venous: 2 mmol/L (ref 0.5–1.9)

## 2021-05-03 MED ORDER — UMECLIDINIUM BROMIDE 62.5 MCG/ACT IN AEPB
1.0000 | INHALATION_SPRAY | Freq: Every day | RESPIRATORY_TRACT | Status: DC
  Administered 2021-05-03 – 2021-05-06 (×4): 1 via RESPIRATORY_TRACT
  Filled 2021-05-03: qty 7

## 2021-05-03 MED ORDER — SODIUM CHLORIDE 0.9 % IV SOLN
100.0000 mg | Freq: Every day | INTRAVENOUS | Status: DC
  Administered 2021-05-04 – 2021-05-06 (×3): 100 mg via INTRAVENOUS
  Filled 2021-05-03 (×5): qty 20

## 2021-05-03 MED ORDER — FUROSEMIDE 10 MG/ML IJ SOLN
60.0000 mg | Freq: Once | INTRAMUSCULAR | Status: AC
  Administered 2021-05-03: 60 mg via INTRAVENOUS
  Filled 2021-05-03: qty 6

## 2021-05-03 MED ORDER — SODIUM CHLORIDE 0.9 % IV SOLN: 500.0000 mg | INTRAVENOUS | Status: DC

## 2021-05-03 MED ORDER — GUAIFENESIN-DM 100-10 MG/5ML PO SYRP
10.0000 mL | ORAL_SOLUTION | ORAL | Status: DC | PRN
  Administered 2021-05-03: 10 mL via ORAL
  Filled 2021-05-03: qty 10

## 2021-05-03 MED ORDER — SODIUM CHLORIDE 0.9 % IV SOLN
200.0000 mg | Freq: Once | INTRAVENOUS | Status: AC
  Administered 2021-05-03 (×2): 200 mg via INTRAVENOUS
  Filled 2021-05-03: qty 40

## 2021-05-03 MED ORDER — HYDROCOD POLST-CPM POLST ER 10-8 MG/5ML PO SUER: 5.0000 mL | Freq: Two times a day (BID) | ORAL | Status: DC | PRN

## 2021-05-03 MED ORDER — FUROSEMIDE 10 MG/ML IJ SOLN
20.0000 mg | Freq: Once | INTRAMUSCULAR | Status: DC
Start: 2021-05-03 — End: 2021-05-03

## 2021-05-03 MED ORDER — AMPICILLIN-SULBACTAM SODIUM 3 (2-1) G IJ SOLR
3.0000 g | Freq: Three times a day (TID) | INTRAMUSCULAR | Status: DC
  Administered 2021-05-03 – 2021-05-04 (×3): 3 g via INTRAVENOUS
  Filled 2021-05-03 (×4): qty 8

## 2021-05-03 MED ORDER — AZITHROMYCIN 500 MG PO TABS
500.0000 mg | ORAL_TABLET | Freq: Every day | ORAL | Status: DC
  Administered 2021-05-03 – 2021-05-04 (×2): 500 mg via ORAL
  Filled 2021-05-03 (×2): qty 1

## 2021-05-03 MED ORDER — FUROSEMIDE 10 MG/ML IJ SOLN: 40.0000 mg | Freq: Once | INTRAMUSCULAR | Status: DC

## 2021-05-03 MED ORDER — AMLODIPINE BESYLATE 10 MG PO TABS
10.0000 mg | ORAL_TABLET | Freq: Every day | ORAL | Status: DC
  Administered 2021-05-03 – 2021-05-06 (×4): 10 mg via ORAL
  Filled 2021-05-03 (×4): qty 1

## 2021-05-03 MED ORDER — FLUTICASONE FUROATE-VILANTEROL 100-25 MCG/ACT IN AEPB
1.0000 | INHALATION_SPRAY | Freq: Every day | RESPIRATORY_TRACT | Status: DC
  Administered 2021-05-03 – 2021-05-06 (×4): 1 via RESPIRATORY_TRACT
  Filled 2021-05-03: qty 28

## 2021-05-03 NOTE — Evaluation (Signed)
Occupational Therapy Evaluation Patient Details Name: Mario Proctor MRN: [id number] DOB: 11/29/1940 Today's Date: 05/03/2021   History of Present Illness 80 y/o male presented to ED on 05/02/21 after being sent by PCP for pneumonia, in ED found to be COVID+. PMH significant for chronic combined systolic and diastolic CHF, COPD, atrial fibrillation, hyperlipidemia and chronic kidney disease stage III.   Clinical Impression   Pt admitted for concerns listed above. PTA pt reported that he was independent with all ADL's and IADLs, including driving and yard work. At this time, pt presents at his baseline with all ADL's and minimally limited by O2 needs/ activity tolerance for functional mobility. Pt educated on energy conservation techniques and has no further OT needs. Acute OT will sign off at this time.       Recommendations for follow up therapy are one component of a multi-disciplinary discharge planning process, led by the attending physician.  Recommendations may be updated based on patient status, additional functional criteria and insurance authorization.   Follow Up Recommendations  No OT follow up    Assistance Recommended at Discharge PRN  Functional Status Assessment  Patient has had a recent decline in their functional status and demonstrates the ability to make significant improvements in function in a reasonable and predictable amount of time.  Equipment Recommendations  None recommended by OT    Recommendations for Other Services       Precautions / Restrictions Precautions Precautions: None Precaution Comments: Watch O2 Restrictions Weight Bearing Restrictions: No      Mobility Bed Mobility Overal bed mobility: Independent                  Transfers Overall transfer level: Independent Equipment used: None                      Balance Overall balance assessment: Mild deficits observed, not formally tested                                          ADL either performed or assessed with clinical judgement   ADL Overall ADL's : At baseline;Modified independent                                       General ADL Comments: No difficulties noted, decreased activity tolerance, ptaware of when he needs to take breaks.     Vision Baseline Vision/History: 0 No visual deficits Ability to See in Adequate Light: 0 Adequate Patient Visual Report: No change from baseline Vision Assessment?: No apparent visual deficits     Perception     Praxis      Pertinent Vitals/Pain Pain Assessment: No/denies pain     Hand Dominance Right   Extremity/Trunk Assessment Upper Extremity Assessment Upper Extremity Assessment: Overall WFL for tasks assessed   Lower Extremity Assessment Lower Extremity Assessment: Defer to PT evaluation   Cervical / Trunk Assessment Cervical / Trunk Assessment: Normal   Communication Communication Communication: No difficulties   Cognition Arousal/Alertness: Awake/alert Behavior During Therapy: WFL for tasks assessed/performed Overall Cognitive Status: Within Functional Limits for tasks assessed  General Comments  VSS on 2L, pt SOB with mobilty    Exercises     Shoulder Instructions      Home Living Family/patient expects to be discharged to:: Private residence Living Arrangements: Spouse/significant other Available Help at Discharge: Family;Available PRN/intermittently Type of Home: House Home Access: Stairs to enter [employer] of Steps: 9 steps Entrance Stairs-Rails: Right Home Layout: One level     Bathroom Shower/Tub: Occupational psychologist: Handicapped height     Home Equipment: Radio producer - single point   Additional Comments: Lives with supportive wife      Prior Functioning/Environment Prior Level of Function : Independent/Modified Independent;Driving                         OT Problem List: Decreased strength;Decreased activity tolerance;Cardiopulmonary status limiting activity      OT Treatment/Interventions:      OT Goals(Current goals can be found in the care plan section) Acute Rehab OT Goals Patient Stated Goal: To get better and go home OT Goal Formulation: With patient Time For Goal Achievement: 05/03/21 Potential to Achieve Goals: Good  OT Frequency:     Barriers to D/C:            Co-evaluation              AM-PAC OT "6 Clicks" Daily Activity     Outcome Measure Help from another person eating meals?: None Help from another person taking care of personal grooming?: None Help from another person toileting, which includes using toliet, bedpan, or urinal?: None Help from another person bathing (including washing, rinsing, drying)?: None Help from another person to put on and taking off regular upper body clothing?: None Help from another person to put on and taking off regular lower body clothing?: None 6 Click Score: 24   End of Session Equipment Utilized During Treatment: Oxygen Nurse Communication: Mobility status  Activity Tolerance: Patient tolerated treatment well Patient left: in bed;with call bell/phone within reach;with family/visitor present  OT Visit Diagnosis: Unsteadiness on feet (R26.81);Muscle weakness (generalized) (M62.81)                Time: 3736-6815 OT Time Calculation (min): 20 min Charges:  OT General Charges $OT Visit: 1 Visit OT Evaluation $OT Eval Low Complexity: [place]., OTR/L Acute Rehabilitation  Johnye Kist Elane Yolanda Bonine 05/03/2021, 1:52 PM

## 2021-05-03 NOTE — Progress Notes (Signed)
Critical lab called lactic acid 2.0,Dr Hal Hope notified no orders given will continue to monitor

## 2021-05-03 NOTE — Progress Notes (Addendum)
PROGRESS NOTE                                                                                                                                                                                                             Patient Demographics:    Mario Proctor, is a 80 y.o. male, DOB - 02/02/1941, YNW:[id number]  Outpatient Primary MD for the patient is Tamsen Roers, MD    LOS - 0  Admit date - 05/02/2021    Chief Complaint  Patient presents with   Shortness of Breath   Pneumonia       Brief Narrative (HPI from H&P)    Mario Proctor is a 80 y.o. male with history of chronic combined systolic and diastolic CHF, COPD, atrial fibrillation, hyperlipidemia and chronic kidney disease stage III has been experiencing increasing shortness of breath and nonproductive cough for the last 1 week, he had incidental finding of COVID-19 infection CT chest showed multifocal pattern of interstitial infiltrate.  He was admitted for pneumonia and acute hypoxic respiratory failure   Subjective:    Mario Proctor today has, No headache, No chest pain, No abdominal pain - No Nausea, No new weakness tingling or numbness, tinges to have a dry cough with some shortness of breath.   Assessment  & Plan :     Acute Hypoxic Resp. Failure due to Acute Covid 19 Viral Pneumonitis vs CAP/CHF - he is fully vaccinated for COVID-19, CT scan and chest x-ray shows multifocal pneumonia for which she is on steroids and remdesivir, inflammatory markers are not elevated.  I think the main component of his hypoxic respiratory failure could be CHF, trial of Lasix, since he has some leukocytosis empiric antibiotics are also reasonable, will have speech rule out aspiration.  Monitor with diuresis  Proctor.  Possible acute on chronic CHF systolic, last known EF 35 %.  Check echocardiogram, trial of IV Lasix.  Continue beta-blocker, Mario Proctor and now empiric.  3.  CKD  3B.  Baseline creatinine around Proctor.5.  Monitor with diuresis  4.  Dyslipidemia.  On statin continue.  5.  Nonspecific CT chest findings.  Outpatient follow-up with pulmonary within 3 to 4 weeks of discharge with outpatient CT scan.  6.  COPD.  Question mild exacerbation.  Continue steroids along with azithromycin.  7.  Paroxysmal atrial fibrillation.  Mario Proctor score of 4.  Continue amiodarone beta-blocker and Xarelto.      Condition -  Guarded  Family Communication  :   Mario Proctor [id number] on 05/03/21  Code Status :  Full  Consults  :  None  PUD Prophylaxis :     Procedures  :     CT - Multifocal patchy opacities including a 4.Proctor cm irregular/spiculated masslike opacity in the posterior right lower lobe. Overall appearance/distribution favors multifocal infection/pneumonia, possibly on the basis of aspiration. However, follow-up CT chest is suggested in 6-12 weeks (after appropriate antimicrobial therapy) to document improvement/resolution. Small mediastinal lymph nodes, likely reactive. Mild superior endplate compression fracture deformity at L1, new from the prior. Aortic Atherosclerosis (ICD10-I70.0) and Emphysema (ICD10-J43.9).  TTE      Disposition Plan  :    Status is: Inpatient  Remains inpatient appropriate because: Acute Hypoxic Resp failure   DVT Prophylaxis  :    rivaroxaban (XARELTO) tablet 20 mg Start: 05/03/21 1700 rivaroxaban (XARELTO) tablet 20 mg     Lab Results  Component Value Date   PLT 268 05/03/2021    Diet :  Diet Order             Diet Heart Room service appropriate? Yes; Fluid consistency: Thin; Fluid restriction: 1200 mL Fluid  Diet effective now                    Inpatient Medications  Scheduled Meds:  albuterol  Proctor.5 mg Nebulization Q4H   amiodarone  100 mg Oral Daily   amLODipine  10 mg Oral Daily   fluticasone furoate-vilanterol  1 puff Inhalation Daily   And   umeclidinium bromide  1 puff Inhalation Daily    furosemide  60 mg Intravenous Once   methylPREDNISolone (SOLU-MEDROL) injection  40 mg Intravenous Q12H   metoprolol succinate  25 mg Oral Daily   rivaroxaban  20 mg Oral Q supper   rosuvastatin  5 mg Oral Daily   sacubitril-valsartan  1 tablet Oral Daily   Continuous Infusions:  azithromycin     [START ON 05/04/2021] remdesivir 100 mg in NS 100 mL     PRN Meds:.acetaminophen **OR** acetaminophen, chlorpheniramine-HYDROcodone, guaiFENesin-dextromethorphan, hydrALAZINE  Antibiotics  :    Anti-infectives (From admission, onward)    Start     Dose/Rate Route Frequency Ordered Stop   05/04/21 1000  remdesivir 100 mg in sodium chloride 0.9 % 100 mL IVPB       See Hyperspace for full Linked Orders Report.   100 mg 200 mL/hr over 30 Minutes Intravenous Daily 05/03/21 0300 05/08/21 0959   05/03/21 1330  azithromycin (ZITHROMAX) 500 mg in sodium chloride 0.9 % 250 mL IVPB       Note to Pharmacy: Pharmacy can adjust timing   500 mg 250 mL/hr over 60 Minutes Intravenous Every 24 hours 05/03/21 1240     05/03/21 0500  remdesivir 200 mg in sodium chloride 0.9% 250 mL IVPB       See Hyperspace for full Linked Orders Report.   200 mg 580 mL/hr over 30 Minutes Intravenous Once 05/03/21 0300 05/03/21 0855   05/02/21 1845  cefTRIAXone (ROCEPHIN) Proctor g in sodium chloride 0.9 % 100 mL IVPB  Status:  Discontinued        Proctor g 200 mL/hr over 30 Minutes Intravenous Every 24 hours 05/02/21 1843 05/03/21 1238   05/02/21 1845  azithromycin (ZITHROMAX) 500 mg in sodium chloride 0.9 % 250 mL IVPB  Status:  Discontinued        500 mg 250 mL/hr over 60 Minutes Intravenous Every 24 hours 05/02/21 1843 05/03/21 1238        Time Spent in minutes  30   Lala Lund M.D on 05/03/2021 at 12:48 PM  To page go to [email or web address]   [facility] -  Office  [phone]  See all Orders from today for further details    Objective:   Vitals:   05/03/21 0653 05/03/21 0859 05/03/21 0903 05/03/21 1121   BP: (!) 151/72     Pulse: 61     Resp:      Temp: (!) 97.5 F (36.4 C)     TempSrc: Oral     SpO2: 94% 97% 97% 96%  Weight:      Height:        Wt Readings from Last 3 Encounters:  05/03/21 72.5 kg  05/02/21 76.3 kg  04/24/21 72.1 kg     Intake/Output Summary (Last 24 hours) at 05/03/2021 1248 Last data filed at 05/02/2021 2322 Gross per 24 hour  Intake 391.06 ml  Output --  Net 391.06 ml     Physical Exam  Awake Alert, No new F.N deficits, Normal affect [place].AT,PERRAL Supple Neck, No JVD,   Symmetrical Chest wall movement, Good air movement bilaterally, CTAB RRR,No Gallops,Rubs or new Murmurs,  +ve B.Sounds, Abd Soft, No tenderness,   No Cyanosis, Clubbing or edema        Data Review:    CBC Recent Labs  Lab 05/02/21 1436 05/03/21 0330  WBC 16.4* 13.3*  HGB 13.6 12.9*  HCT 42.6 40.1  PLT 305 268  MCV 96.4 95.Proctor  MCH 30.8 30.6  MCHC 31.9 32.Proctor  RDW 13.4 13.4  LYMPHSABS 0.5*  --   MONOABS 0.7  --   EOSABS 0.0  --   BASOSABS 0.1  --     Electrolytes Recent Labs  Lab 05/02/21 1436 05/02/21 1842 05/03/21 0330 05/03/21 0857  NA 130*  --  134*  --   K 4.7  --  4.6  --   CL 95*  --  98  --   CO2 26  --  28  --   GLUCOSE 170*  --  180*  --   BUN 24*  --  26*  --   CREATININE 1.67*  --  1.44*  --   CALCIUM 8.4*  --  8.3*  --   AST 37  --   --   --   ALT 46*  --   --   --   ALKPHOS 76  --   --   --   BILITOT 0.9  --   --   --   ALBUMIN Proctor.8*  --   --   --   CRP  --   --   --  1.8*  DDIMER  --   --   --  0.32  PROCALCITON  --   --  <0.10  --   LATICACIDVEN  --  1.Proctor Proctor.0*  --   INR  --  1.5*  --   --   BNP  --   --   --  1,313.9*    ------------------------------------------------------------------------------------------------------------------ No results for input(s): CHOL, HDL, LDLCALC, TRIG, CHOLHDL, LDLDIRECT in the last 72 hours.  No results found for: HGBA1C  No results for input(s): TSH, T4TOTAL, T3FREE, THYROIDAB in the last 72  hours.  Invalid input(s): FREET3 ------------------------------------------------------------------------------------------------------------------ ID Labs Recent Labs  Lab 05/02/21 1436 05/02/21 1842 05/03/21 0330 05/03/21 0857  WBC 16.4*  --  13.3*  --   PLT 305  --  268  --   CRP  --   --   --  1.8*  DDIMER  --   --   --  0.32  PROCALCITON  --   --  <0.10  --   LATICACIDVEN  --  1.Proctor Proctor.0*  --   CREATININE 1.67*  --  1.44*  --    Cardiac Enzymes No results for input(s): CKMB, TROPONINI, MYOGLOBIN in the last 168 hours.  Invalid input(s): CK    Radiology Reports DG Chest Proctor View  Result Date: 05/02/2021 CLINICAL DATA:  Provided history: Shortness of breath for 1 week, new hypoxia EXAM: CHEST - Proctor VIEW COMPARISON:  Chest CT 10/14/2020.  Chest radiographs 05/14/2020 FINDINGS: An ovoid electronic device projects over the left chest. This may reflect a loop recorder or leadless pacer device. Heart size within normal limits. Aortic atherosclerosis. Moderate-sized airspace opacity within the right lower lobe and right infrahilar region. Additionally, there is suggestion of a subtle nodular airspace opacity within the left upper lobe. No evidence of pleural effusion or pneumothorax. No acute bony abnormality identified. IMPRESSION: Moderate-sized airspace opacity within the right lower lobe and right infrahilar region, compatible with pneumonia in the appropriate clinical setting. Followup PA and lateral chest radiographs are recommended in 3-4 weeks following trial of antibiotic therapy to ensure resolution and exclude underlying malignancy. Suggestion of a subtle nodular airspace opacity within the left upper lobe. This may also be infectious/inflammatory in etiology. However, if this finding persists at time of radiographic follow-up, a chest CT is recommended for further evaluation. Aortic Atherosclerosis (ICD10-I70.0). Electronically Signed   By: Kellie Simmering D.O.   On: 05/02/2021 11:13    CT Chest Wo Contrast  Result Date: 05/02/2021 CLINICAL DATA:  Respiratory illness, abnormal chest radiograph, hypoxia EXAM: CT CHEST WITHOUT CONTRAST TECHNIQUE: Multidetector CT imaging of the chest was performed following the standard protocol without IV contrast. COMPARISON:  Chest radiographs dated 05/02/2021. CT chest dated 10/14/2020. FINDINGS: Cardiovascular: Heart is normal in size.  No pericardial effusion. No evidence of thoracic aortic aneurysm. Atherosclerotic calcifications of the arch. Mild coronary atherosclerosis of the LAD and right coronary artery. Mediastinum/Nodes: Small mediastinal lymph nodes, including a 9 mm short axis AP window node (series 3/image 32), likely reactive. Visualized thyroid is unremarkable. Lungs/Pleura: Proctor.7 x 4.Proctor cm irregular/spiculated masslike opacity in the posterior right lower lobe (series 4/image 101). Mild associated hyperdensity within the lesion (series 3/image 102). Adjacent debris within right lower lobe bronchi (series 4/image 101). Additional 1.4 x Proctor.6 cm irregular nodular opacity in the posterior right upper lobe (series 4/image 35) and 7 x 12 mm irregular nodular opacity in the posterior left upper lobe (series 4/image 61). Additional mild irregular linear/nodular opacity in the right upper lobe (series 4/image 45). Despite the masslike appearance of the dominant lesion, the overall appearance, distribution, and ancillary findings favor multifocal infection/pneumonia, possibly on the basis of aspiration. However, this does warrant follow-up. Moderate centrilobular and paraseptal emphysematous changes, upper lung predominant. No pleural effusion or pneumothorax. Upper Abdomen: Visualized upper abdomen is notable for vascular calcifications and a Proctor.4 cm right upper pole renal cyst. Musculoskeletal: Mild superior endplate compression fracture deformity at L1, new from the prior. IMPRESSION: Multifocal patchy opacities including a 4.Proctor cm irregular/spiculated  masslike opacity in the posterior right lower lobe. Overall appearance/distribution favors multifocal infection/pneumonia, possibly on the basis  of aspiration. However, follow-up CT chest is suggested in 6-12 weeks (after appropriate antimicrobial therapy) to document improvement/resolution. Small mediastinal lymph nodes, likely reactive. Mild superior endplate compression fracture deformity at L1, new from the prior. Aortic Atherosclerosis (ICD10-I70.0) and Emphysema (ICD10-J43.9). Electronically Signed   By: Julian Hy M.D.   On: 05/02/2021 15:04   DG Chest Port 1 View  Result Date: 05/03/2021 CLINICAL DATA:  80 year old male with shortness of breath. Abnormal chest CT. EXAM: PORTABLE CHEST 1 VIEW COMPARISON:  Chest CT 0230 hours today and earlier. FINDINGS: Portable AP upright view at 0902 hours. Spiculated density projecting over the right inferior hilum corresponding to the dominant abnormality on CT. Otherwise normal cardiac size and mediastinal contours. Cardiac loop recorder or lead less ICD at the left chest. Visualized tracheal air column is within normal limits. Stable ventilation. No pneumothorax or pleural effusion. No acute osseous abnormality identified. IMPRESSION: 1. Unchanged from the CT at 0230 hours today with dominant spiculated opacity projecting over the right hilum. Proctor. No new cardiopulmonary abnormality. Electronically Signed   By: Genevie Ann M.D.   On: 05/03/2021 09:19

## 2021-05-03 NOTE — ED Notes (Addendum)
ED TO INPATIENT HANDOFF REPORT  ED Nurse Name and Phone #: Larene Beach  [phone]  S Name/Age/Gender Mario Proctor 80 y.o. male Room/Bed: 037C/037C  Code Status   Code Status: Full Code  Home/SNF/Other Home Patient oriented to: self, place, time, and situation Is this baseline? Yes   Triage Complete: Triage complete  Chief Complaint CAP (community acquired pneumonia) [J18.9]  Triage Note Pt sent here by PCP for pneumonia, shortness of breath and low SpO2 sat. Pt went to see pcp for shob, did an xray today that showed pneumonia. Pt is requiring 2L O2 via [place]. Shob worse w/ exertion. Denies pain, fever, chills, N/V.   Allergies No Known Allergies  Level of Care/Admitting Diagnosis ED Disposition     ED Disposition  Admit   Condition  --   Comment  Hospital Area: [facility] [[id number]]  Level of Care: Telemetry Medical [104]  May place patient in observation at [facility] or [facility] if equivalent level of care is available:: No  Covid Evaluation: Asymptomatic Screening Protocol (No Symptoms)  Diagnosis: CAP (community acquired pneumonia) [778242]  Admitting Physician: Rise Patience [phone]  Attending Physician: Rise Patience [email or web address]          B Medical/Surgery History Past Medical History:  Diagnosis Date   A-fib ([place])    Allergy    CHF (congestive heart failure) ([place])    Dupuytren contracture    right sm finger   Myocardial infarct ([place]) 2019   Myocardial infarction ([place])    Small bowel obstruction ([place])    Past Surgical History:  Procedure Laterality Date   CARDIOVERSION N/A 07/30/2020   Procedure: CARDIOVERSION;  Surgeon: [name], MD;  Location: [facility];  Service: Cardiovascular;  Laterality: N/A;   COLON SURGERY     bowel blockage   DUPUYTREN CONTRACTURE RELEASE Left 2011   FASCIECTOMY Right 05/02/2015   Procedure: FASCIECTOMY RIGHT SMALL FINGER;  Surgeon: Daryll Brod, MD;  Location: [facility];   Service: Orthopedics;  Laterality: Right;  axillary block in preop   LEFT HEART CATH AND CORONARY ANGIOGRAPHY N/A 11/03/2016   Procedure: Left Heart Cath and Coronary Angiography;  Surgeon: [name], MD;  Location: [facility];  Service: Cardiovascular;  Laterality: N/A;   TONSILLECTOMY     TYMPANOPLASTY Right      A IV Location/Drains/Wounds Patient Lines/Drains/Airways Status     Active Line/Drains/Airways     Name Placement date Placement time Site Days   Peripheral IV 05/02/21 20 G Anterior;Left Forearm 05/02/21  1901  Forearm  1   Incision (Closed) 05/02/15 Hand Right 05/02/15  1105  -- 2193   Wound / Incision (Open or Dehisced) 08/30/20 Laceration Eye Right;Upper;Other (Comment) laceration with stitches 08/30/20  0222  Eye  246   Wound / Incision (Open or Dehisced) 08/30/20 Laceration Wrist Distal;Lower;Posterior;Right 08/30/20  0222  Wrist  246            Intake/Output Last 24 hours  Intake/Output Summary (Last 24 hours) at 05/03/2021 0129 Last data filed at 05/02/2021 2322 Gross per 24 hour  Intake 391.06 ml  Output --  Net 391.06 ml    Labs/Imaging Results for orders placed or performed during the hospital encounter of 05/02/21 (from the past 48 hour(s))  CBC with Differential     Status: Abnormal   Collection Time: 05/02/21  2:36 PM  Result Value Ref Range   WBC 16.4 (H) 4.0 - 10.5 K/uL   RBC 4.42 4.22 -  5.81 MIL/uL   Hemoglobin 13.6 13.0 - 17.0 g/dL   HCT 42.6 39.0 - 52.0 %   MCV 96.4 80.0 - 100.0 fL   MCH 30.8 26.0 - 34.0 pg   MCHC 31.9 30.0 - 36.0 g/dL   RDW 13.4 11.5 - 15.5 %   Platelets 305 150 - 400 K/uL   nRBC 0.0 0.0 - 0.2 %   Neutrophils Relative % 91 %   Neutro Abs 14.9 (H) 1.7 - 7.7 K/uL   Lymphocytes Relative 3 %   Lymphs Abs 0.5 (L) 0.7 - 4.0 K/uL   Monocytes Relative 4 %   Monocytes Absolute 0.7 0.1 - 1.0 K/uL   Eosinophils Relative 0 %   Eosinophils Absolute 0.0 0.0 - 0.5 K/uL   Basophils Relative 0 %   Basophils Absolute 0.1  0.0 - 0.1 K/uL   Immature Granulocytes 2 %   Abs Immature Granulocytes 0.31 (H) 0.00 - 0.07 K/uL    Comment: Performed at [facility] [address], [place], [place] [address]  Comprehensive metabolic panel     Status: Abnormal   Collection Time: 05/02/21  2:36 PM  Result Value Ref Range   Sodium 130 (L) 135 - 145 mmol/L   Potassium 4.7 3.5 - 5.1 mmol/L   Chloride 95 (L) 98 - 111 mmol/L   CO2 26 22 - 32 mmol/L   Glucose, Bld 170 (H) 70 - 99 mg/dL    Comment: Glucose reference range applies only to samples taken after fasting for at least 8 hours.   BUN 24 (H) 8 - 23 mg/dL   Creatinine, Ser 1.67 (H) 0.61 - 1.24 mg/dL   Calcium 8.4 (L) 8.9 - 10.3 mg/dL   Total Protein 6.4 (L) 6.5 - 8.1 g/dL   Albumin 2.8 (L) 3.5 - 5.0 g/dL   AST 37 15 - 41 U/L   ALT 46 (H) 0 - 44 U/L   Alkaline Phosphatase 76 38 - 126 U/L   Total Bilirubin 0.9 0.3 - 1.2 mg/dL   GFR, Estimated 41 (L) >60 mL/min    Comment: (NOTE) Calculated using the CKD-EPI Creatinine Equation (2021)    Anion gap 9 5 - 15    Comment: Performed at [facility], [place] [address] [place], [place] [address]  Lactic acid, plasma     Status: None   Collection Time: 05/02/21  6:42 PM  Result Value Ref Range   Lactic Acid, Venous 1.2 0.5 - 1.9 mmol/L    Comment: Performed at [facility] [address], [place], [place] [address]  Protime-INR     Status: Abnormal   Collection Time: 05/02/21  6:42 PM  Result Value Ref Range   Prothrombin Time 18.4 (H) 11.4 - 15.2 seconds   INR 1.5 (H) 0.8 - 1.2    Comment: (NOTE) INR goal varies based on device and disease states. Performed at [facility], [place] [address], [place],  [address]   APTT     Status: None   Collection Time: 05/02/21  6:42 PM  Result Value Ref Range   aPTT 33 24 - 36 seconds    Comment: Performed at [facility] [address], [place],  [address]  Resp Panel by RT-PCR (Flu A&B, Covid) Nasopharyngeal Swab     Status:  Abnormal   Collection Time: 05/02/21  8:15 PM   Specimen: Nasopharyngeal Swab; Nasopharyngeal(NP) swabs in vial transport medium  Result Value Ref Range   SARS  Coronavirus 2 by RT PCR POSITIVE (A) NEGATIVE    Comment: (NOTE) SARS-CoV-2 target nucleic acids are DETECTED.  The SARS-CoV-2 RNA is generally detectable in upper respiratory specimens during the acute phase of infection. Positive results are indicative of the presence of the identified virus, but do not rule out bacterial infection or co-infection with other pathogens not detected by the test. Clinical correlation with patient history and other diagnostic information is necessary to determine patient infection status. The expected result is Negative.  Fact Sheet for Patients: EntrepreneurPulse.com.au  Fact Sheet for Healthcare Providers: IncredibleEmployment.be  This test is not yet approved or cleared by the [place] FDA and  has been authorized for detection and/or diagnosis of SARS-CoV-2 by FDA under an Emergency Use Authorization (EUA).  This EUA will remain in effect (meaning this test can be used) for the duration of  the COVID-19 declaration under Section 564(b)(1) of the A ct, 21 U.S.C. section 360bbb-3(b)(1), unless the authorization is terminated or revoked sooner.     Influenza A by PCR NEGATIVE NEGATIVE   Influenza B by PCR NEGATIVE NEGATIVE    Comment: (NOTE) The Xpert Xpress SARS-CoV-2/FLU/RSV plus assay is intended as an aid in the diagnosis of influenza from Nasopharyngeal swab specimens and should not be used as a sole basis for treatment. Nasal washings and aspirates are unacceptable for Xpert Xpress SARS-CoV-2/FLU/RSV testing.  Fact Sheet for Patients: EntrepreneurPulse.com.au  Fact Sheet for Healthcare Providers: IncredibleEmployment.be  This test is not yet approved or cleared by the [place] FDA and has been  authorized for detection and/or diagnosis of SARS-CoV-2 by FDA under an Emergency Use Authorization (EUA). This EUA will remain in effect (meaning this test can be used) for the duration of the COVID-19 declaration under Section 564(b)(1) of the Act, 21 U.S.C. section 360bbb-3(b)(1), unless the authorization is terminated or revoked.  Performed at [facility], [place] [address], [place], [place] [address]   Urinalysis, Routine w reflex microscopic Urine, Clean Catch     Status: Abnormal   Collection Time: 05/02/21  9:45 PM  Result Value Ref Range   Color, Urine YELLOW YELLOW   APPearance CLEAR CLEAR   Specific Gravity, Urine 1.020 1.005 - 1.030   pH 6.0 5.0 - 8.0   Glucose, UA >=500 (A) NEGATIVE mg/dL   Hgb urine dipstick SMALL (A) NEGATIVE   Bilirubin Urine NEGATIVE NEGATIVE   Ketones, ur NEGATIVE NEGATIVE mg/dL   Protein, ur 30 (A) NEGATIVE mg/dL   Nitrite NEGATIVE NEGATIVE   Leukocytes,Ua NEGATIVE NEGATIVE    Comment: Performed at [facility] [address] [place], [place] [address]  Urinalysis, Microscopic (reflex)     Status: None   Collection Time: 05/02/21  9:45 PM  Result Value Ref Range   RBC / HPF 6-10 0 - 5 RBC/hpf   WBC, UA 0-5 0 - 5 WBC/hpf   Bacteria, UA NONE SEEN NONE SEEN   Squamous Epithelial / LPF 0-5 0 - 5    Comment: Performed at [facility], [place] [address] [place],  [address]   DG Chest 2 View  Result Date: 05/02/2021 CLINICAL DATA:  Provided history: Shortness of breath for 1 week, new hypoxia EXAM: CHEST - 2 VIEW COMPARISON:  Chest CT 10/14/2020.  Chest radiographs 05/14/2020 FINDINGS: An ovoid electronic device projects over the left chest. This may reflect a loop recorder or leadless pacer device. Heart size within normal limits. Aortic atherosclerosis. Moderate-sized airspace opacity within the right lower  lobe and right infrahilar region. Additionally, there is suggestion of a subtle nodular airspace opacity within the left  upper lobe. No evidence of pleural effusion or pneumothorax. No acute bony abnormality identified. IMPRESSION: Moderate-sized airspace opacity within the right lower lobe and right infrahilar region, compatible with pneumonia in the appropriate clinical setting. Followup PA and lateral chest radiographs are recommended in 3-4 weeks following trial of antibiotic therapy to ensure resolution and exclude underlying malignancy. Suggestion of a subtle nodular airspace opacity within the left upper lobe. This may also be infectious/inflammatory in etiology. However, if this finding persists at time of radiographic follow-up, a chest CT is recommended for further evaluation. Aortic Atherosclerosis (ICD10-I70.0). Electronically Signed   By: Kellie Simmering D.O.   On: 05/02/2021 11:13   CT Chest Wo Contrast  Result Date: 05/02/2021 CLINICAL DATA:  Respiratory illness, abnormal chest radiograph, hypoxia EXAM: CT CHEST WITHOUT CONTRAST TECHNIQUE: Multidetector CT imaging of the chest was performed following the standard protocol without IV contrast. COMPARISON:  Chest radiographs dated 05/02/2021. CT chest dated 10/14/2020. FINDINGS: Cardiovascular: Heart is normal in size.  No pericardial effusion. No evidence of thoracic aortic aneurysm. Atherosclerotic calcifications of the arch. Mild coronary atherosclerosis of the LAD and right coronary artery. Mediastinum/Nodes: Small mediastinal lymph nodes, including a 9 mm short axis AP window node (series 3/image 32), likely reactive. Visualized thyroid is unremarkable. Lungs/Pleura: 2.7 x 4.2 cm irregular/spiculated masslike opacity in the posterior right lower lobe (series 4/image 101). Mild associated hyperdensity within the lesion (series 3/image 102). Adjacent debris within right lower lobe bronchi (series 4/image 101). Additional 1.4 x 2.6 cm irregular nodular opacity in the posterior right upper lobe (series 4/image 35) and 7 x 12 mm irregular nodular opacity in the  posterior left upper lobe (series 4/image 61). Additional mild irregular linear/nodular opacity in the right upper lobe (series 4/image 45). Despite the masslike appearance of the dominant lesion, the overall appearance, distribution, and ancillary findings favor multifocal infection/pneumonia, possibly on the basis of aspiration. However, this does warrant follow-up. Moderate centrilobular and paraseptal emphysematous changes, upper lung predominant. No pleural effusion or pneumothorax. Upper Abdomen: Visualized upper abdomen is notable for vascular calcifications and a 2.4 cm right upper pole renal cyst. Musculoskeletal: Mild superior endplate compression fracture deformity at L1, new from the prior. IMPRESSION: Multifocal patchy opacities including a 4.2 cm irregular/spiculated masslike opacity in the posterior right lower lobe. Overall appearance/distribution favors multifocal infection/pneumonia, possibly on the basis of aspiration. However, follow-up CT chest is suggested in 6-12 weeks (after appropriate antimicrobial therapy) to document improvement/resolution. Small mediastinal lymph nodes, likely reactive. Mild superior endplate compression fracture deformity at L1, new from the prior. Aortic Atherosclerosis (ICD10-I70.0) and Emphysema (ICD10-J43.9). Electronically Signed   By: Julian Hy M.D.   On: 05/02/2021 15:04    Pending Labs Unresulted Labs (From admission, onward)     Start     Ordered   05/03/21 0500  CBC  Tomorrow morning,   R        05/02/21 2148   05/03/21 1610  Basic metabolic panel  Tomorrow morning,   R        05/02/21 2148   05/02/21 2147  Expectorated Sputum Assessment w Gram Stain, Rflx to Resp Cult  (COPD / Pneumonia / Cellulitis / Lower Extremity Wound)  Once,   R        05/02/21 2148   05/02/21 1842  Lactic acid, plasma  (Septic presentation on arrival (screening labs, nursing and treatment orders for  obvious sepsis))  Now then every 2 hours,   STAT,   Status:   Canceled      05/02/21 1843   05/02/21 1842  Blood Culture (routine x 2)  (Septic presentation on arrival (screening labs, nursing and treatment orders for obvious sepsis))  BLOOD CULTURE X 2,   STAT      05/02/21 1843   05/02/21 1842  Urine Culture  (Septic presentation on arrival (screening labs, nursing and treatment orders for obvious sepsis))  ONCE - STAT,   STAT       Question:  Indication  Answer:  Sepsis   05/02/21 1843            Vitals/Pain Today's Vitals   05/02/21 2200 05/02/21 2228 05/03/21 0025 05/03/21 0123  BP: 140/69   (!) 153/75  Pulse: (!) 58   62  Resp: 15   16  Temp:      TempSrc:      SpO2: 94% 96% 97% 96%  PainSc:        Isolation Precautions No active isolations  Medications Medications  cefTRIAXone (ROCEPHIN) 2 g in sodium chloride 0.9 % 100 mL IVPB (0 g Intravenous Stopped 05/02/21 2134)  azithromycin (ZITHROMAX) 500 mg in sodium chloride 0.9 % 250 mL IVPB (0 mg Intravenous Stopped 05/02/21 2322)  amiodarone (PACERONE) tablet 100 mg (has no administration in time range)  sacubitril-valsartan (ENTRESTO) 97-103 mg per tablet (has no administration in time range)  metoprolol succinate (TOPROL-XL) 24 hr tablet 25 mg (has no administration in time range)  rosuvastatin (CRESTOR) tablet 5 mg (has no administration in time range)  rivaroxaban (XARELTO) tablet 20 mg (has no administration in time range)  Fluticasone-Umeclidin-Vilant 100-62.5-25 MCG/ACT AEPB 1 puff (has no administration in time range)  acetaminophen (TYLENOL) tablet 650 mg (has no administration in time range)    Or  acetaminophen (TYLENOL) suppository 650 mg (has no administration in time range)  albuterol (PROVENTIL) (2.5 MG/3ML) 0.083% nebulizer solution 2.5 mg (2.5 mg Nebulization Given 05/02/21 2228)  albuterol (PROVENTIL) (2.5 MG/3ML) 0.083% nebulizer solution 2.5 mg (2.5 mg Nebulization Given 05/03/21 0025)  methylPREDNISolone sodium succinate (SOLU-MEDROL) 40 mg/mL injection 40 mg  (40 mg Intravenous Given 05/02/21 2325)  hydrALAZINE (APRESOLINE) injection 10 mg (has no administration in time range)  methylPREDNISolone sodium succinate (SOLU-MEDROL) 125 mg/2 mL injection 125 mg (125 mg Intravenous Given 05/02/21 1912)  ipratropium-albuterol (DUONEB) 0.5-2.5 (3) MG/3ML nebulizer solution 3 mL (3 mLs Nebulization Given 05/02/21 1912)    Mobility walks with person assist Low fall risk   Focused Assessments Pulmonary Assessment Handoff:  Lung sounds: Bilateral Breath Sounds: Diminished, Expiratory wheezes O2 Device: Nasal Cannula O2 Flow Rate (L/min): 2 L/min    R Recommendations: See Admitting Provider Note  Report given to:   Additional Notes:  COVID +

## 2021-05-03 NOTE — Evaluation (Signed)
Physical Therapy Evaluation Patient Details Name: Mario Proctor MRN: [id number] DOB: 18-Jan-1941 Today's Date: 05/03/2021  History of Present Illness  80 y/o male presented to ED on 05/02/21 after being sent by PCP for pneumonia, in ED found to be COVID+. PMH significant for chronic combined systolic and diastolic CHF, COPD, atrial fibrillation, hyperlipidemia and chronic kidney disease stage III.  Clinical Impression  Patient admitted for above diagnosis. Patient demonstrates weakness and decreased activity tolerance. Patient at supervision level for ambulation. Patient requires 2L O2 to maintain spO2 >90%. Patient dropped to 85% on RA with short distance. Patient will benefit from skilled PT services during acute stay to address listed deficits. Anticipate no PT follow up at discharge. Will continue to follow to improve activity tolerance.        Recommendations for follow up therapy are one component of a multi-disciplinary discharge planning process, led by the attending physician.  Recommendations may be updated based on patient status, additional functional criteria and insurance authorization.  Follow Up Recommendations No PT follow up    Assistance Recommended at Discharge PRN  Functional Status Assessment Patient has had a recent decline in their functional status and demonstrates the ability to make significant improvements in function in a reasonable and predictable amount of time.  Equipment Recommendations  None recommended by PT    Recommendations for Other Services       Precautions / Restrictions Precautions Precautions: None Precaution Comments: Watch O2 Restrictions Weight Bearing Restrictions: No      Mobility  Bed Mobility Overal bed mobility: Independent                  Transfers Overall transfer level: Independent Equipment used: None                    Ambulation/Gait Ambulation/Gait assistance: Supervision Gait Distance (Feet): 25  Feet Assistive device: None Gait Pattern/deviations: WFL(Within Functional Limits) Gait velocity: decreased     General Gait Details: ambulated on RA with drop in spO2 to 85%. Donned 2L with increase to 92% within 45 seconds. supervision for safety for line management  Stairs            Wheelchair Mobility    Modified Rankin (Stroke Patients Only)       Balance Overall balance assessment: Mild deficits observed, not formally tested                                           Pertinent Vitals/Pain Pain Assessment: No/denies pain    Home Living Family/patient expects to be discharged to:: Private residence Living Arrangements: Spouse/significant other Available Help at Discharge: Family;Available PRN/intermittently Type of Home: House Home Access: Stairs to enter Entrance Stairs-Rails: Right Entrance Stairs-Number of Steps: 9 steps   Home Layout: One level Home Equipment: Cane - single point Additional Comments: Lives with supportive wife    Prior Function Prior Level of Function : Independent/Modified Independent;Driving                     Hand Dominance   Dominant Hand: Right    Extremity/Trunk Assessment   Upper Extremity Assessment Upper Extremity Assessment: Defer to OT evaluation    Lower Extremity Assessment Lower Extremity Assessment: Overall WFL for tasks assessed    Cervical / Trunk Assessment Cervical / Trunk Assessment: Normal  Communication   Communication: No difficulties  Cognition Arousal/Alertness: Awake/alert Behavior During Therapy: WFL for tasks assessed/performed Overall Cognitive Status: Within Functional Limits for tasks assessed                                          General Comments General comments (skin integrity, edema, etc.): On 2L on arrival, spO2 95%. Ambulated on RA with drop to 85% with short distance. Donned 2L at end of session with increase to 92%    Exercises      Assessment/Plan    PT Assessment Patient needs continued PT services  PT Problem List Decreased strength;Decreased activity tolerance;Decreased balance       PT Treatment Interventions DME instruction;Gait training;Therapeutic activities;Stair training;Functional mobility training;Therapeutic exercise;Balance training;Patient/family education    PT Goals (Current goals can be found in the Care Plan section)  Acute Rehab PT Goals Patient Stated Goal: to go home PT Goal Formulation: With patient Time For Goal Achievement: 05/17/21 Potential to Achieve Goals: Good Additional Goals Additional Goal #1: patient will maintain O2 sats >95% with ambulation    Frequency Min 3X/week   Barriers to discharge        Co-evaluation               AM-PAC PT "6 Clicks" Mobility  Outcome Measure Help needed turning from your back to your side while in a flat bed without using bedrails?: None Help needed moving from lying on your back to sitting on the side of a flat bed without using bedrails?: None Help needed moving to and from a bed to a chair (including a wheelchair)?: None Help needed standing up from a chair using your arms (e.g., wheelchair or bedside chair)?: None Help needed to walk in hospital room?: A Little Help needed climbing 3-5 steps with a railing? : A Little 6 Click Score: 22    End of Session Equipment Utilized During Treatment: Oxygen Activity Tolerance: Patient tolerated treatment well Patient left: in bed;with call bell/phone within reach;with family/visitor present Nurse Communication: Mobility status PT Visit Diagnosis: Muscle weakness (generalized) (M62.81)    Time: 2025-4270 PT Time Calculation (min) (ACUTE ONLY): 32 min   Charges:   PT Evaluation $PT Eval Low Complexity: 1 Low PT Treatments $Therapeutic Activity: 8-22 mins        Bairon Klemann A. Gilford Rile PT, DPT Acute Rehabilitation Services Pager [phone] Office [phone]   Linna Hoff 05/03/2021, 4:22 PM

## 2021-05-03 NOTE — Evaluation (Signed)
Clinical/Bedside Swallow Evaluation Patient Details  Name: Mario Proctor MRN: [id number] Date of Birth: 07-09-1940  Today's Date: 05/03/2021 Time: SLP Start Time (ACUTE ONLY): 3 SLP Stop Time (ACUTE ONLY): 1310 SLP Time Calculation (min) (ACUTE ONLY): 8 min  Past Medical History:  Past Medical History:  Diagnosis Date   A-fib ([place])    Allergy    CHF (congestive heart failure) (HCC)    Dupuytren contracture    right sm finger   Myocardial infarct ([place]) 2019   Myocardial infarction ([place])    Small bowel obstruction ([place])    Past Surgical History:  Past Surgical History:  Procedure Laterality Date   CARDIOVERSION N/A 07/30/2020   Procedure: CARDIOVERSION;  Surgeon: [name], MD;  Location: [facility];  Service: Cardiovascular;  Laterality: N/A;   COLON SURGERY     bowel blockage   DUPUYTREN CONTRACTURE RELEASE Left 2011   FASCIECTOMY Right 05/02/2015   Procedure: FASCIECTOMY RIGHT SMALL FINGER;  Surgeon: Daryll Brod, MD;  Location: [facility];  Service: Orthopedics;  Laterality: Right;  axillary block in preop   LEFT HEART CATH AND CORONARY ANGIOGRAPHY N/A 11/03/2016   Procedure: Left Heart Cath and Coronary Angiography;  Surgeon: [name], MD;  Location: [facility];  Service: Cardiovascular;  Laterality: N/A;   TONSILLECTOMY     TYMPANOPLASTY Right    HPI:  Mario Proctor is a 80 y.o. male with history of chronic combined systolic and diastolic CHF, COPD, atrial fibrillation, hyperlipidemia and chronic kidney disease stage III has been experiencing increasing shortness of breath and nonproductive cough for the last 1 week, he had incidental finding of COVID-19 infection CT chest showed multifocal pattern of interstitial infiltrate.  He was admitted for pneumonia and acute hypoxic respiratory failure    Assessment / Plan / Recommendation  Clinical Impression  Pt demonstrates no difficulty swallowing and denies any history of difficulty. No history  of reflux. Recommend pt continue regular diet and thin liquids. SLP will sign off.      Aspiration Risk       Diet Recommendation Regular;Thin liquid   Liquid Administration via: Cup;Straw Medication Administration: Whole meds with liquid Supervision: Patient able to self feed    Other  Recommendations      Recommendations for follow up therapy are one component of a multi-disciplinary discharge planning process, led by the attending physician.  Recommendations may be updated based on patient status, additional functional criteria and insurance authorization.     Swallow Study   General HPI: Mario Proctor is a 80 y.o. male with history of chronic combined systolic and diastolic CHF, COPD, atrial fibrillation, hyperlipidemia and chronic kidney disease stage III has been experiencing increasing shortness of breath and nonproductive cough for the last 1 week, he had incidental finding of COVID-19 infection CT chest showed multifocal pattern of interstitial infiltrate.  He was admitted for pneumonia and acute hypoxic respiratory failure Type of Study: Bedside Swallow Evaluation Previous Swallow Assessment: none Diet Prior to this Study: Regular;Thin liquids Temperature Spikes Noted: No Respiratory Status: Nasal cannula History of Recent Intubation: No Behavior/Cognition: Alert;Cooperative Oral Cavity Assessment: Erythema;Within Functional Limits Oral Care Completed by SLP: No Oral Cavity - Dentition: Adequate natural dentition Vision: Functional for self-feeding Self-Feeding Abilities: Able to feed self Patient Positioning: Upright in bed Baseline Vocal Quality: Normal Volitional Cough: Strong Volitional Swallow: Able to elicit    Oral/Motor/Sensory Function Overall Oral Motor/Sensory Function: Within functional limits   Ice Chips  Thin Liquid Thin Liquid: Within functional limits    Nectar Thick Nectar Thick Liquid: Not tested   Honey Thick Honey Thick Liquid: Not tested    Puree Puree: Within functional limits   Solid     Solid: Within functional limits      Addi Pak, Katherene Ponto 05/03/2021,1:17 PM

## 2021-05-03 NOTE — Progress Notes (Signed)
Pharmacy Antibiotic Note  Mario Proctor is a 80 y.o. male admitted on 05/02/2021 with pneumonia.  Azithromycin and Ceftriaxone begun 12/23. COVID+, Remdesivir begun 12/24. Pharmacy has been consulted for Unasyn dosing for aspiration pneumonia. Ceftriaxone discontinued.  Plan: Unasyn 3gm IV q8h. Will monitor renal function for any need to modify regimen. Azithromycin 500 mg IV q24h x 5 days begun 12/23 pm. Remdesivir 5 day course begun this am. Follow culture data, clinical progress and antibiotic plans.  Height: 5\' 11"  (180.3 cm) Weight: 72.5 kg (159 lb 13.3 oz) IBW/kg (Calculated) : 75.3  Temp (24hrs), Avg:97.9 F (36.6 C), Min:97.5 F (36.4 C), Max:98.3 F (36.8 C)  Recent Labs  Lab 05/02/21 1436 05/02/21 1842 05/03/21 0330  WBC 16.4*  --  13.3*  CREATININE 1.67*  --  1.44*  LATICACIDVEN  --  1.2 2.0*    Estimated Creatinine Clearance: 42.7 mL/min (A) (by C-G formula based on SCr of 1.44 mg/dL (H)).    No Known Allergies  Antimicrobials this admission:  Remdesivir 12/24>>(12/28)  Azithromycin IV 12/23 >> (12/27)  Ceftriaxone x 1 on 12/23  Unasyn 12/24 >>  Dose adjustments this admission: n/a  Microbiology results:  12/23 COVID: positive, flu: negative  12/23 blood: no growth < 24 hrs to date  12/23 urine:  12/23 sputum:  12/24 MRSA PCR:  Thank you for allowing pharmacy to be a part of this patients care.  Mario Proctor, [place] 05/03/2021 12:52 PM

## 2021-05-03 NOTE — Progress Notes (Signed)
New Admission Note:   Arrival Method: stretcher Mental Orientation: alert x 4 Telemetry: box 12 Assessment: Completed Skin: see flowsheet IV: NSL Pain: none Tubes: none Safety Measures: Safety Fall Prevention Plan has been discussed Admission: Completed [facility] Orientation: Patient has been orientated to the room, unit and staff.  Family: none  Orders have been reviewed and implemented. Will continue to monitor the patient. Call light has been placed within reach and bed alarm has been activated.   Rockie Neighbours BSN, RN Phone number: [phone]

## 2021-05-04 ENCOUNTER — Inpatient Hospital Stay (HOSPITAL_COMMUNITY): Payer: Medicare HMO

## 2021-05-04 DIAGNOSIS — J9601 Acute respiratory failure with hypoxia: Secondary | ICD-10-CM | POA: Diagnosis not present

## 2021-05-04 DIAGNOSIS — I4819 Other persistent atrial fibrillation: Secondary | ICD-10-CM | POA: Diagnosis not present

## 2021-05-04 LAB — CBC WITH DIFFERENTIAL/PLATELET
Abs Immature Granulocytes: 0.3 10*3/uL — ABNORMAL HIGH (ref 0.00–0.07)
Basophils Absolute: 0 10*3/uL (ref 0.0–0.1)
Basophils Relative: 0 %
Eosinophils Absolute: 0 10*3/uL (ref 0.0–0.5)
Eosinophils Relative: 0 %
HCT: 40.8 % (ref 39.0–52.0)
Hemoglobin: 13.5 g/dL (ref 13.0–17.0)
Immature Granulocytes: 1 %
Lymphocytes Relative: 2 %
Lymphs Abs: 0.4 10*3/uL — ABNORMAL LOW (ref 0.7–4.0)
MCH: 31.6 pg (ref 26.0–34.0)
MCHC: 33.1 g/dL (ref 30.0–36.0)
MCV: 95.6 fL (ref 80.0–100.0)
Monocytes Absolute: 0.5 10*3/uL (ref 0.1–1.0)
Monocytes Relative: 2 %
Neutro Abs: 22.8 10*3/uL — ABNORMAL HIGH (ref 1.7–7.7)
Neutrophils Relative %: 95 %
Platelets: 270 10*3/uL (ref 150–400)
RBC: 4.27 MIL/uL (ref 4.22–5.81)
RDW: 13.6 % (ref 11.5–15.5)
WBC: 24 10*3/uL — ABNORMAL HIGH (ref 4.0–10.5)
nRBC: 0 % (ref 0.0–0.2)

## 2021-05-04 LAB — ECHOCARDIOGRAM LIMITED
AR max vel: 3.67 cm2
AV Area VTI: 3.25 cm2
AV Area mean vel: 3.23 cm2
AV Mean grad: 3 mmHg
AV Peak grad: 5.9 mmHg
Ao pk vel: 1.21 m/s
Area-P 1/2: 2.52 cm2
Calc EF: 52.3 %
Height: 71 in
MV VTI: 3.72 cm2
S' Lateral: 3.5 cm
Single Plane A2C EF: 48.8 %
Single Plane A4C EF: 50.9 %
Weight: 2557.34 oz

## 2021-05-04 LAB — URINE CULTURE: Culture: NO GROWTH

## 2021-05-04 LAB — COMPREHENSIVE METABOLIC PANEL
ALT: 42 U/L (ref 0–44)
AST: 33 U/L (ref 15–41)
Albumin: 2.6 g/dL — ABNORMAL LOW (ref 3.5–5.0)
Alkaline Phosphatase: 64 U/L (ref 38–126)
Anion gap: 10 (ref 5–15)
BUN: 30 mg/dL — ABNORMAL HIGH (ref 8–23)
CO2: 29 mmol/L (ref 22–32)
Calcium: 8.5 mg/dL — ABNORMAL LOW (ref 8.9–10.3)
Chloride: 96 mmol/L — ABNORMAL LOW (ref 98–111)
Creatinine, Ser: 1.53 mg/dL — ABNORMAL HIGH (ref 0.61–1.24)
GFR, Estimated: 46 mL/min — ABNORMAL LOW (ref >60)
Glucose, Bld: 176 mg/dL — ABNORMAL HIGH (ref 70–99)
Potassium: 4 mmol/L (ref 3.5–5.1)
Sodium: 135 mmol/L (ref 135–145)
Total Bilirubin: 0.4 mg/dL (ref 0.3–1.2)
Total Protein: 6 g/dL — ABNORMAL LOW (ref 6.5–8.1)

## 2021-05-04 LAB — C-REACTIVE PROTEIN: CRP: 0.9 mg/dL (ref <1.0)

## 2021-05-04 LAB — MAGNESIUM: Magnesium: 2.1 mg/dL (ref 1.7–2.4)

## 2021-05-04 LAB — BRAIN NATRIURETIC PEPTIDE: B Natriuretic Peptide: 710.2 pg/mL — ABNORMAL HIGH (ref 0.0–100.0)

## 2021-05-04 LAB — D-DIMER, QUANTITATIVE: D-Dimer, Quant: 0.37 ug/mL-FEU (ref 0.00–0.50)

## 2021-05-04 MED ORDER — METHYLPREDNISOLONE SODIUM SUCC 40 MG IJ SOLR
40.0000 mg | INTRAMUSCULAR | Status: DC
  Administered 2021-05-04: 40 mg via INTRAVENOUS
  Filled 2021-05-04: qty 1

## 2021-05-04 MED ORDER — FUROSEMIDE 10 MG/ML IJ SOLN
40.0000 mg | Freq: Once | INTRAMUSCULAR | Status: AC
  Administered 2021-05-04: 40 mg via INTRAVENOUS
  Filled 2021-05-04: qty 4

## 2021-05-04 MED ORDER — IPRATROPIUM-ALBUTEROL 0.5-2.5 (3) MG/3ML IN SOLN
3.0000 mL | Freq: Four times a day (QID) | RESPIRATORY_TRACT | Status: DC
Start: 2021-05-04 — End: 2021-05-05
  Administered 2021-05-04 (×2): 3 mL via RESPIRATORY_TRACT
  Filled 2021-05-04 (×2): qty 3

## 2021-05-04 MED ORDER — FUROSEMIDE 10 MG/ML IJ SOLN
40.0000 mg | Freq: Once | INTRAMUSCULAR | Status: DC
Start: 2021-05-04 — End: 2021-05-04

## 2021-05-04 MED ORDER — IPRATROPIUM-ALBUTEROL 0.5-2.5 (3) MG/3ML IN SOLN
3.0000 mL | RESPIRATORY_TRACT | Status: DC
  Administered 2021-05-04 (×2): 3 mL via RESPIRATORY_TRACT
  Filled 2021-05-04 (×2): qty 3

## 2021-05-04 MED ORDER — ALBUTEROL SULFATE (2.5 MG/3ML) 0.083% IN NEBU: 2.5000 mg | INHALATION_SOLUTION | RESPIRATORY_TRACT | Status: DC | PRN

## 2021-05-04 NOTE — Progress Notes (Signed)
PROGRESS NOTE                                                                                                                                                                                                             Patient Demographics:    Mario Proctor, is a 80 y.o. male, DOB - [birth year], JJO:[id number]  Outpatient Primary MD for the patient is Tamsen Roers, MD    LOS - 1  Admit date - 05/02/2021    Chief Complaint  Patient presents with   Shortness of Breath   Pneumonia       Brief Narrative (HPI from H&P)    Mario Proctor is a 80 y.o. male with history of chronic combined systolic and diastolic CHF, COPD, atrial fibrillation, hyperlipidemia and chronic kidney disease stage III has been experiencing increasing shortness of breath and nonproductive cough for the last 1 week, he had incidental finding of COVID-19 infection CT chest showed multifocal pattern of interstitial infiltrate.  He was admitted for pneumonia and acute hypoxic respiratory failure   Subjective:   Patient in bed, appears comfortable, denies any headache, no fever, no chest pain or pressure, improved shortness of breath , no abdominal pain. No new focal weakness.    Assessment  & Plan :     Acute Hypoxic Resp. Failure due to Acute Covid 19 Viral Pneumonitis vs CAP/CHF - he is fully vaccinated for COVID-19, CT scan and chest x-ray shows multifocal pneumonia for which she is on steroids and remdesivir, inflammatory markers are not elevated.  I think the main component of his hypoxic respiratory failure  is CHF, he is much improved after IV Lasix on 05/03/2021 which will be repeated, will stop we will stop antibiotics.  Continue treatment for COVID-19 infection which most likely could be incidental however CT scan shows interstitial pattern against CHF versus COVID  2.  Possible acute on chronic CHF systolic, last known EF 35 %.  Check  echocardiogram, trial of IV Lasix.  Continue beta-blocker, Lisabeth Register and now empiric.  3.  CKD 3B.  Baseline creatinine around 2.5.  Monitor with diuresis  4.  Dyslipidemia.  On statin continue.  5.  Nonspecific CT chest findings.  Outpatient follow-up with pulmonary within 3 to 4 weeks of discharge with outpatient CT scan.  6.  COPD.  Question mild exacerbation.  Continue steroids but will rapidly taper off along with azithromycin.  7.  Paroxysmal atrial fibrillation.  Mali vas 2 score of 4.  Continue amiodarone beta-blocker and Xarelto.      Condition -  Guarded  Family Communication  :   wife B[place] [phone] on 05/03/21  Code Status :  Full  Consults  :  None  PUD Prophylaxis :     Procedures  :     CT - Multifocal patchy opacities including a 4.2 cm irregular/spiculated masslike opacity in the posterior right lower lobe. Overall appearance/distribution favors multifocal infection/pneumonia, possibly on the basis of aspiration. However, follow-up CT chest is suggested in 6-12 weeks (after appropriate antimicrobial therapy) to document improvement/resolution. Small mediastinal lymph nodes, likely reactive. Mild superior endplate compression fracture deformity at L1, new from the prior. Aortic Atherosclerosis (ICD10-I70.0) and Emphysema (ICD10-J43.9).  TTE      Disposition Plan  :    Status is: Inpatient  Remains inpatient appropriate because: Acute Hypoxic Resp failure   DVT Prophylaxis  :    rivaroxaban (XARELTO) tablet 20 mg Start: 05/03/21 1700 rivaroxaban (XARELTO) tablet 20 mg     Lab Results  Component Value Date   PLT 270 05/04/2021    Diet :  Diet Order             Diet Heart Room service appropriate? Yes; Fluid consistency: Thin; Fluid restriction: 1200 mL Fluid  Diet effective now                    Inpatient Medications  Scheduled Meds:  amiodarone  100 mg Oral Daily   amLODipine  10 mg Oral Daily   azithromycin  500 mg Oral  QHS   fluticasone furoate-vilanterol  1 puff Inhalation Daily   And   umeclidinium bromide  1 puff Inhalation Daily   furosemide  40 mg Intravenous Once   ipratropium-albuterol  3 mL Nebulization Q6H   methylPREDNISolone (SOLU-MEDROL) injection  40 mg Intravenous Q24H   metoprolol succinate  25 mg Oral Daily   rivaroxaban  20 mg Oral Q supper   rosuvastatin  5 mg Oral Daily   sacubitril-valsartan  1 tablet Oral Daily   Continuous Infusions:  ampicillin-sulbactam (UNASYN) IV 3 g (05/04/21 0654)   remdesivir 100 mg in NS 100 mL 100 mg (05/04/21 1043)   PRN Meds:.acetaminophen **OR** acetaminophen, albuterol, chlorpheniramine-HYDROcodone, guaiFENesin-dextromethorphan, hydrALAZINE  Antibiotics  :    Anti-infectives (From admission, onward)    Start     Dose/Rate Route Frequency Ordered Stop   05/04/21 1000  remdesivir 100 mg in sodium chloride 0.9 % 100 mL IVPB       See Hyperspace for full Linked Orders Report.   100 mg 200 mL/hr over 30 Minutes Intravenous Daily 05/03/21 0300 05/08/21 0959   05/03/21 2200  azithromycin (ZITHROMAX) tablet 500 mg        500 mg Oral Daily at bedtime 05/03/21 1302 05/07/21 2159   05/03/21 2000  azithromycin (ZITHROMAX) 500 mg in sodium chloride 0.9 % 250 mL IVPB  Status:  Discontinued       Note to Pharmacy: Pharmacy can adjust timing   500 mg 250 mL/hr over 60 Minutes Intravenous Every 24 hours 05/03/21 1240 05/03/21 1302   05/03/21 1330  Ampicillin-Sulbactam (UNASYN) 3 g in sodium chloride 0.9 % 100 mL IVPB        3 g 200 mL/hr over 30 Minutes Intravenous Every 8 hours 05/03/21 1252     05/03/21 0500  remdesivir 200 mg in sodium chloride 0.9% 250 mL IVPB       See Hyperspace for full Linked Orders Report.   200 mg 580 mL/hr over 30 Minutes Intravenous Once 05/03/21 0300 05/03/21 0855   05/02/21 1845  cefTRIAXone (ROCEPHIN) 2 g in sodium chloride 0.9 % 100 mL IVPB  Status:  Discontinued        2 g 200 mL/hr over 30 Minutes Intravenous Every 24  hours 05/02/21 1843 05/03/21 1238   05/02/21 1845  azithromycin (ZITHROMAX) 500 mg in sodium chloride 0.9 % 250 mL IVPB  Status:  Discontinued        500 mg 250 mL/hr over 60 Minutes Intravenous Every 24 hours 05/02/21 1843 05/03/21 1238        Time Spent in minutes  30   Lala Lund M.D on 05/04/2021 at 11:08 AM  To page go to [email or web address]   [facility] -  Office  [phone]  See all Orders from today for further details    Objective:   Vitals:   05/04/21 0452 05/04/21 0924 05/04/21 0929 05/04/21 1034  BP: 139/85   (!) 142/65  Pulse: 63 62  61  Resp: 18 18  18   Temp: 97.9 F (36.6 C)   (!) 97.5 F (36.4 C)  TempSrc: Oral   Oral  SpO2: 95% 96% 96% 96%  Weight:      Height:        Wt Readings from Last 3 Encounters:  05/03/21 72.5 kg  05/02/21 76.3 kg  04/24/21 72.1 kg     Intake/Output Summary (Last 24 hours) at 05/04/2021 1108 Last data filed at 05/04/2021 0335 Gross per 24 hour  Intake 300 ml  Output 850 ml  Net -550 ml     Physical Exam  Awake Alert, No new F.N deficits, Normal affect [place].AT,PERRAL Supple Neck, No JVD,   Symmetrical Chest wall movement, Good air movement bilaterally, few rales RRR,No Gallops, Rubs or new Murmurs,  +ve B.Sounds, Abd Soft, No tenderness,   No Cyanosis, Clubbing or edema      Data Review:    CBC Recent Labs  Lab 05/02/21 1436 05/03/21 0330 05/04/21 0816  WBC 16.4* 13.3* 24.0*  HGB 13.6 12.9* 13.5  HCT 42.6 40.1 40.8  PLT 305 268 270  MCV 96.4 95.2 95.6  MCH 30.8 30.6 31.6  MCHC 31.9 32.2 33.1  RDW 13.4 13.4 13.6  LYMPHSABS 0.5*  --  0.4*  MONOABS 0.7  --  0.5  EOSABS 0.0  --  0.0  BASOSABS 0.1  --  0.0    Electrolytes Recent Labs  Lab 05/02/21 1436 05/02/21 1842 05/03/21 0330 05/03/21 0857 05/04/21 0816  NA 130*  --  134*  --  135  K 4.7  --  4.6  --  4.0  CL 95*  --  98  --  96*  CO2 26  --  28  --  29  GLUCOSE 170*  --  180*  --  176*  BUN 24*  --  26*  --  30*   CREATININE 1.67*  --  1.44*  --  1.53*  CALCIUM 8.4*  --  8.3*  --  8.5*  AST 37  --   --   --  33  ALT 46*  --   --   --  42  ALKPHOS 76  --   --   --  64  BILITOT 0.9  --   --   --  0.4  ALBUMIN 2.8*  --   --   --  2.6*  MG  --   --   --   --  2.1  CRP  --   --   --  1.8* 0.9  DDIMER  --   --   --  0.32 0.37  PROCALCITON  --   --  <0.10  --   --   LATICACIDVEN  --  1.2 2.0*  --   --   INR  --  1.5*  --   --   --   BNP  --   --   --  1,313.9* 710.2*    ------------------------------------------------------------------------------------------------------------------ No results for input(s): CHOL, HDL, LDLCALC, TRIG, CHOLHDL, LDLDIRECT in the last 72 hours.  No results found for: HGBA1C  No results for input(s): TSH, T4TOTAL, T3FREE, THYROIDAB in the last 72 hours.  Invalid input(s): FREET3 ------------------------------------------------------------------------------------------------------------------ ID Labs Recent Labs  Lab 05/02/21 1436 05/02/21 1842 05/03/21 0330 05/03/21 0857 05/04/21 0816  WBC 16.4*  --  13.3*  --  24.0*  PLT 305  --  268  --  270  CRP  --   --   --  1.8* 0.9  DDIMER  --   --   --  0.32 0.37  PROCALCITON  --   --  <0.10  --   --   LATICACIDVEN  --  1.2 2.0*  --   --   CREATININE 1.67*  --  1.44*  --  1.53*   Cardiac Enzymes No results for input(s): CKMB, TROPONINI, MYOGLOBIN in the last 168 hours.  Invalid input(s): CK    Radiology Reports DG Chest 2 View  Result Date: 05/02/2021 CLINICAL DATA:  Provided history: Shortness of breath for 1 week, new hypoxia EXAM: CHEST - 2 VIEW COMPARISON:  Chest CT 10/14/2020.  Chest radiographs 05/14/2020 FINDINGS: An ovoid electronic device projects over the left chest. This may reflect a loop recorder or leadless pacer device. Heart size within normal limits. Aortic atherosclerosis. Moderate-sized airspace opacity within the right lower lobe and right infrahilar region. Additionally, there is suggestion  of a subtle nodular airspace opacity within the left upper lobe. No evidence of pleural effusion or pneumothorax. No acute bony abnormality identified. IMPRESSION: Moderate-sized airspace opacity within the right lower lobe and right infrahilar region, compatible with pneumonia in the appropriate clinical setting. Followup PA and lateral chest radiographs are recommended in 3-4 weeks following trial of antibiotic therapy to ensure resolution and exclude underlying malignancy. Suggestion of a subtle nodular airspace opacity within the left upper lobe. This may also be infectious/inflammatory in etiology. However, if this finding persists at time of radiographic follow-up, a chest CT is recommended for further evaluation. Aortic Atherosclerosis (ICD10-I70.0). Electronically Signed   By: Kellie Simmering D.O.   On: 05/02/2021 11:13   CT Chest Wo Contrast  Result Date: 05/02/2021 CLINICAL DATA:  Respiratory illness, abnormal chest radiograph, hypoxia EXAM: CT CHEST WITHOUT CONTRAST TECHNIQUE: Multidetector CT imaging of the chest was performed following the standard protocol without IV contrast. COMPARISON:  Chest radiographs dated 05/02/2021. CT chest dated 10/14/2020. FINDINGS: Cardiovascular: Heart is normal in size.  No pericardial effusion. No evidence of thoracic aortic aneurysm. Atherosclerotic calcifications of the arch. Mild coronary atherosclerosis of the LAD and right coronary artery. Mediastinum/Nodes: Small mediastinal lymph nodes, including a 9 mm short axis AP window node (series 3/image 32), likely reactive. Visualized thyroid is unremarkable. Lungs/Pleura: 2.7 x 4.2 cm irregular/spiculated masslike opacity in the posterior right lower lobe (  series 4/image 101). Mild associated hyperdensity within the lesion (series 3/image 102). Adjacent debris within right lower lobe bronchi (series 4/image 101). Additional 1.4 x 2.6 cm irregular nodular opacity in the posterior right upper lobe (series 4/image 35)  and 7 x 12 mm irregular nodular opacity in the posterior left upper lobe (series 4/image 61). Additional mild irregular linear/nodular opacity in the right upper lobe (series 4/image 45). Despite the masslike appearance of the dominant lesion, the overall appearance, distribution, and ancillary findings favor multifocal infection/pneumonia, possibly on the basis of aspiration. However, this does warrant follow-up. Moderate centrilobular and paraseptal emphysematous changes, upper lung predominant. No pleural effusion or pneumothorax. Upper Abdomen: Visualized upper abdomen is notable for vascular calcifications and a 2.4 cm right upper pole renal cyst. Musculoskeletal: Mild superior endplate compression fracture deformity at L1, new from the prior. IMPRESSION: Multifocal patchy opacities including a 4.2 cm irregular/spiculated masslike opacity in the posterior right lower lobe. Overall appearance/distribution favors multifocal infection/pneumonia, possibly on the basis of aspiration. However, follow-up CT chest is suggested in 6-12 weeks (after appropriate antimicrobial therapy) to document improvement/resolution. Small mediastinal lymph nodes, likely reactive. Mild superior endplate compression fracture deformity at L1, new from the prior. Aortic Atherosclerosis (ICD10-I70.0) and Emphysema (ICD10-J43.9). Electronically Signed   By: Julian Hy M.D.   On: 05/02/2021 15:04   DG Chest Port 1 View  Result Date: 05/03/2021 CLINICAL DATA:  80 year old male with shortness of breath. Abnormal chest CT. EXAM: PORTABLE CHEST 1 VIEW COMPARISON:  Chest CT 0230 hours today and earlier. FINDINGS: Portable AP upright view at 0902 hours. Spiculated density projecting over the right inferior hilum corresponding to the dominant abnormality on CT. Otherwise normal cardiac size and mediastinal contours. Cardiac loop recorder or lead less ICD at the left chest. Visualized tracheal air column is within normal limits. Stable  ventilation. No pneumothorax or pleural effusion. No acute osseous abnormality identified. IMPRESSION: 1. Unchanged from the CT at 0230 hours today with dominant spiculated opacity projecting over the right hilum. 2. No new cardiopulmonary abnormality. Electronically Signed   By: Genevie Ann M.D.   On: 05/03/2021 09:19

## 2021-05-04 NOTE — Progress Notes (Signed)
°  Transition of Care [facility]) Screening Note   Patient Details  Name: Mario Proctor Date of Birth: Nov 13, 1940   Transition of Care [facility]) CM/SW Contact:    Bartholomew Crews, RN Phone Number: [phone] 05/04/2021, 11:04 AM    Transition of Care Department [facility]) has reviewed patient and no TOC needs have been identified at this time. We will continue to monitor patient advancement through interdisciplinary progression rounds. If new patient transition needs arise, please place a TOC consult.

## 2021-05-04 NOTE — Progress Notes (Signed)
°  Echocardiogram 2D Echocardiogram has been performed.  Mario Proctor 05/04/2021, 1:21 PM

## 2021-05-05 ENCOUNTER — Inpatient Hospital Stay (HOSPITAL_COMMUNITY): Payer: Medicare HMO

## 2021-05-05 DIAGNOSIS — J9601 Acute respiratory failure with hypoxia: Secondary | ICD-10-CM | POA: Diagnosis not present

## 2021-05-05 DIAGNOSIS — I4819 Other persistent atrial fibrillation: Secondary | ICD-10-CM | POA: Diagnosis not present

## 2021-05-05 LAB — CBC WITH DIFFERENTIAL/PLATELET
Abs Immature Granulocytes: 0.26 10*3/uL — ABNORMAL HIGH (ref 0.00–0.07)
Basophils Absolute: 0 10*3/uL (ref 0.0–0.1)
Basophils Relative: 0 %
Eosinophils Absolute: 0 10*3/uL (ref 0.0–0.5)
Eosinophils Relative: 0 %
HCT: 43.9 % (ref 39.0–52.0)
Hemoglobin: 14.3 g/dL (ref 13.0–17.0)
Immature Granulocytes: 1 %
Lymphocytes Relative: 2 %
Lymphs Abs: 0.4 10*3/uL — ABNORMAL LOW (ref 0.7–4.0)
MCH: 31.4 pg (ref 26.0–34.0)
MCHC: 32.6 g/dL (ref 30.0–36.0)
MCV: 96.5 fL (ref 80.0–100.0)
Monocytes Absolute: 0.3 10*3/uL (ref 0.1–1.0)
Monocytes Relative: 2 %
Neutro Abs: 18.4 10*3/uL — ABNORMAL HIGH (ref 1.7–7.7)
Neutrophils Relative %: 95 %
Platelets: 270 10*3/uL (ref 150–400)
RBC: 4.55 MIL/uL (ref 4.22–5.81)
RDW: 13.6 % (ref 11.5–15.5)
WBC: 19.4 10*3/uL — ABNORMAL HIGH (ref 4.0–10.5)
nRBC: 0 % (ref 0.0–0.2)

## 2021-05-05 LAB — PHOSPHORUS: Phosphorus: 3.9 mg/dL (ref 2.5–4.6)

## 2021-05-05 LAB — COMPREHENSIVE METABOLIC PANEL
ALT: 47 U/L — ABNORMAL HIGH (ref 0–44)
AST: 32 U/L (ref 15–41)
Albumin: 2.9 g/dL — ABNORMAL LOW (ref 3.5–5.0)
Alkaline Phosphatase: 76 U/L (ref 38–126)
Anion gap: 10 (ref 5–15)
BUN: 39 mg/dL — ABNORMAL HIGH (ref 8–23)
CO2: 31 mmol/L (ref 22–32)
Calcium: 8.3 mg/dL — ABNORMAL LOW (ref 8.9–10.3)
Chloride: 91 mmol/L — ABNORMAL LOW (ref 98–111)
Creatinine, Ser: 1.61 mg/dL — ABNORMAL HIGH (ref 0.61–1.24)
GFR, Estimated: 43 mL/min — ABNORMAL LOW (ref >60)
Glucose, Bld: 211 mg/dL — ABNORMAL HIGH (ref 70–99)
Potassium: 4.3 mmol/L (ref 3.5–5.1)
Sodium: 132 mmol/L — ABNORMAL LOW (ref 135–145)
Total Bilirubin: 0.7 mg/dL (ref 0.3–1.2)
Total Protein: 6.4 g/dL — ABNORMAL LOW (ref 6.5–8.1)

## 2021-05-05 LAB — C-REACTIVE PROTEIN: CRP: 0.9 mg/dL (ref <1.0)

## 2021-05-05 LAB — D-DIMER, QUANTITATIVE: D-Dimer, Quant: 0.3 ug/mL-FEU (ref 0.00–0.50)

## 2021-05-05 LAB — BRAIN NATRIURETIC PEPTIDE: B Natriuretic Peptide: 497.1 pg/mL — ABNORMAL HIGH (ref 0.0–100.0)

## 2021-05-05 LAB — MAGNESIUM: Magnesium: 2 mg/dL (ref 1.7–2.4)

## 2021-05-05 MED ORDER — IPRATROPIUM-ALBUTEROL 0.5-2.5 (3) MG/3ML IN SOLN
3.0000 mL | Freq: Three times a day (TID) | RESPIRATORY_TRACT | Status: DC
Start: 2021-05-05 — End: 2021-05-05
  Administered 2021-05-05: 3 mL via RESPIRATORY_TRACT
  Filled 2021-05-05: qty 3

## 2021-05-05 MED ORDER — RIVAROXABAN 15 MG PO TABS
15.0000 mg | ORAL_TABLET | Freq: Every day | ORAL | Status: DC
  Administered 2021-05-05: 15 mg via ORAL
  Filled 2021-05-05: qty 1

## 2021-05-05 MED ORDER — FUROSEMIDE 40 MG PO TABS
40.0000 mg | ORAL_TABLET | Freq: Once | ORAL | Status: AC
  Administered 2021-05-05: 40 mg via ORAL
  Filled 2021-05-05: qty 1

## 2021-05-05 MED ORDER — IPRATROPIUM-ALBUTEROL 0.5-2.5 (3) MG/3ML IN SOLN
3.0000 mL | Freq: Two times a day (BID) | RESPIRATORY_TRACT | Status: DC
  Administered 2021-05-05 – 2021-05-06 (×2): 3 mL via RESPIRATORY_TRACT
  Filled 2021-05-05 (×2): qty 3

## 2021-05-05 NOTE — Progress Notes (Addendum)
Physical Therapy Treatment and Discharge Patient Details Name: Mario Proctor MRN: [id number] DOB: 1941/01/20 Today's Date: 05/05/2021   History of Present Illness 80 y/o male presented to ED on 05/02/21 after being sent by PCP for pneumonia, in ED found to be COVID+. PMH significant for chronic combined systolic and diastolic CHF, COPD, atrial fibrillation, hyperlipidemia and chronic kidney disease stage III.    PT Comments    Pt met his physical therapy goals during his inpatient stay. Performing transfers and ambulating room distances independently. Performed standing exercises for BLE strengthening and endurance (see below). At rest, SpO2 91-92% on RA. With activity, desaturation to 84% on RA. Rebounded to 90% with ~1.5 minute standing rest break. Education provided regarding energy conservation techniques I.e. optimal home setup, activity recommendations, pulse oximeter use, endurance strategies. Pt/pt family with no further questions or concerns. PT signing off at this time. Thank you for this consult.    Recommendations for follow up therapy are one component of a multi-disciplinary discharge planning process, led by the attending physician.  Recommendations may be updated based on patient status, additional functional criteria and insurance authorization.  Follow Up Recommendations  No PT follow up     Assistance Recommended at Discharge PRN  Equipment Recommendations  None recommended by PT    Recommendations for Other Services       Precautions / Restrictions Precautions Precautions: None Precaution Comments: Watch O2 Restrictions Weight Bearing Restrictions: No     Mobility  Bed Mobility Overal bed mobility: Independent                  Transfers Overall transfer level: Independent Equipment used: None                    Ambulation/Gait Ambulation/Gait assistance: Independent Gait Distance (Feet):  (room distances) Assistive device: None Gait  Pattern/deviations: WFL(Within Functional Limits)           Stairs             Wheelchair Mobility    Modified Rankin (Stroke Patients Only)       Balance Overall balance assessment: No apparent balance deficits (not formally assessed)                                          Cognition Arousal/Alertness: Awake/alert Behavior During Therapy: WFL for tasks assessed/performed Overall Cognitive Status: Within Functional Limits for tasks assessed                                          Exercises General Exercises - Lower Extremity Hip Flexion/Marching: Both;10 reps;Standing Heel Raises: Both;20 reps;Standing Mini-Sqauts: 10 reps;Standing Other Exercises Other Exercises: Standing: bilateral hamstring curls x 10 each, side stepping to R/L x 10 ft (5 reps)    General Comments        Pertinent Vitals/Pain Pain Assessment: No/denies pain    Home Living                          Prior Function            PT Goals (current goals can now be found in the care plan section) Acute Rehab PT Goals Patient Stated Goal: to go home PT Goal Formulation: With patient  Time For Goal Achievement: 05/17/21 Potential to Achieve Goals: Good Progress towards PT goals: Goals met/education completed, patient discharged from PT    Frequency    Min 3X/week      PT Plan Other (comment) (d/c therapies)    Co-evaluation              AM-PAC PT "6 Clicks" Mobility   Outcome Measure  Help needed turning from your back to your side while in a flat bed without using bedrails?: None Help needed moving from lying on your back to sitting on the side of a flat bed without using bedrails?: None Help needed moving to and from a bed to a chair (including a wheelchair)?: None Help needed standing up from a chair using your arms (e.g., wheelchair or bedside chair)?: None Help needed to walk in hospital room?: None Help needed climbing  3-5 steps with a railing? : A Little 6 Click Score: 23    End of Session   Activity Tolerance: Patient tolerated treatment well Patient left: in bed;with call bell/phone within reach;with family/visitor present Nurse Communication: Mobility status PT Visit Diagnosis: Muscle weakness (generalized) (M62.81)     Time: 4932-4199 PT Time Calculation (min) (ACUTE ONLY): 26 min  Charges:  $Therapeutic Exercise: 8-22 mins $Self Care/Home Management: 8-22                     Wyona Almas, PT, DPT Acute Rehabilitation Services Pager [phone] Office [phone]    [name] 05/05/2021, 4:52 PM

## 2021-05-05 NOTE — Progress Notes (Signed)
PROGRESS NOTE                                                                                                                                                                                                             Patient Demographics:    Mario Proctor, is a 80 y.o. male, DOB - 04-18-1941, XLK:[id number]  Outpatient Primary MD for the patient is Tamsen Roers, MD    LOS - 2  Admit date - 05/02/2021    Chief Complaint  Patient presents with   Shortness of Breath   Pneumonia       Brief Narrative (HPI from H&P)    Mario Proctor is a 80 y.o. male with history of chronic combined systolic and diastolic CHF, COPD, atrial fibrillation, hyperlipidemia and chronic kidney disease stage III has been experiencing increasing shortness of breath and nonproductive cough for the last 1 week, he had incidental finding of COVID-19 infection CT chest showed multifocal pattern of interstitial infiltrate.  He was admitted for pneumonia and acute hypoxic respiratory failure   Subjective:   Patient in bed, appears comfortable, denies any headache, no fever, no chest pain or pressure, no shortness of breath , no abdominal pain. No new focal weakness.   Assessment  & Plan :     Acute Hypoxic Resp. Failure due to Acute Covid 19 Viral Pneumonitis vs CAP/CHF - he is fully vaccinated for COVID-19, CT scan and chest x-ray shows multifocal pneumonia for which she is on steroids and remdesivir, inflammatory markers are not elevated.  I think the main component of his hypoxic respiratory failure  is CHF, he is much improved after IV Lasix on 05/03/2021 which will be repeated, will stop Unasyn and Azithro, stopped Steroids, continue Lasix.  2.  Possible acute on chronic CHF systolic, last known EF 45 % up from 35%.  Noted echocardiogram, trial of IV Lasix.  Continue beta-blocker, Delene Loll, Farxiga.  3.  CKD 3B.  Baseline creatinine around 2.5.   Monitor with diuresis  4.  Dyslipidemia.  On statin continue.  5.  Nonspecific CT chest findings.  Outpatient follow-up with pulmonary within 3 to 4 weeks of discharge with outpatient CT scan.  6.  COPD.  Question mild exacerbation.  Continue steroids but will rapidly taper off along with azithromycin.  7.  Paroxysmal atrial fibrillation.  Mali vas  2 score of 4.  Continue amiodarone beta-blocker and Xarelto.      Condition -  Guarded  Family Communication  :   wife Flatt [phone] on 05/03/21  Code Status :  Full  Consults  :  None  PUD Prophylaxis :     Procedures  :     CT - Multifocal patchy opacities including a 4.2 cm irregular/spiculated masslike opacity in the posterior right lower lobe. Overall appearance/distribution favors multifocal infection/pneumonia, possibly on the basis of aspiration. However, follow-up CT chest is suggested in 6-12 weeks (after appropriate antimicrobial therapy) to document improvement/resolution. Small mediastinal lymph nodes, likely reactive. Mild superior endplate compression fracture deformity at L1, new from the prior. Aortic Atherosclerosis (ICD10-I70.0) and Emphysema (ICD10-J43.9).  TTE -  1. Left ventricular ejection fraction, by estimation, is 45 to 50%. The left ventricle has mildly decreased function. The left ventricle demonstrates global hypokinesis. There is mild left ventricular hypertrophy. Left ventricular diastolic parameters were normal.  2. Right ventricular systolic function is normal. The right ventricular size is normal.  3. The mitral valve is normal in structure. Trivial mitral valve regurgitation. No evidence of mitral stenosis.  4. The aortic valve is normal in structure. Aortic valve regurgitation is not visualized. No aortic stenosis is present.  5. Compared to previous outpatient study in 10/2020, LVEF is marginally improved from 35-40%.      Disposition Plan  :    Status is: Inpatient  Remains inpatient appropriate  because: Acute Hypoxic Resp failure   DVT Prophylaxis  :    rivaroxaban (XARELTO) tablet 20 mg Start: 05/03/21 1700 rivaroxaban (XARELTO) tablet 20 mg     Lab Results  Component Value Date   PLT 270 05/04/2021    Diet :  Diet Order             Diet Heart Room service appropriate? Yes; Fluid consistency: Thin; Fluid restriction: 1200 mL Fluid  Diet effective now                    Inpatient Medications  Scheduled Meds:  amiodarone  100 mg Oral Daily   amLODipine  10 mg Oral Daily   azithromycin  500 mg Oral QHS   fluticasone furoate-vilanterol  1 puff Inhalation Daily   And   umeclidinium bromide  1 puff Inhalation Daily   furosemide  40 mg Oral Once   ipratropium-albuterol  3 mL Nebulization TID   methylPREDNISolone (SOLU-MEDROL) injection  40 mg Intravenous Q24H   metoprolol succinate  25 mg Oral Daily   rivaroxaban  20 mg Oral Q supper   rosuvastatin  5 mg Oral Daily   sacubitril-valsartan  1 tablet Oral Daily   Continuous Infusions:  remdesivir 100 mg in NS 100 mL 100 mg (05/04/21 1043)   PRN Meds:.acetaminophen **OR** acetaminophen, albuterol, chlorpheniramine-HYDROcodone, guaiFENesin-dextromethorphan, hydrALAZINE  Antibiotics  :    Anti-infectives (From admission, onward)    Start     Dose/Rate Route Frequency Ordered Stop   05/04/21 1000  remdesivir 100 mg in sodium chloride 0.9 % 100 mL IVPB       See Hyperspace for full Linked Orders Report.   100 mg 200 mL/hr over 30 Minutes Intravenous Daily 05/03/21 0300 05/08/21 0959   05/03/21 2200  azithromycin (ZITHROMAX) tablet 500 mg        500 mg Oral Daily at bedtime 05/03/21 1302 05/07/21 2159   05/03/21 2000  azithromycin (ZITHROMAX) 500 mg in sodium chloride 0.9 % 250 mL IVPB  Status:  Discontinued       Note to Pharmacy: Pharmacy can adjust timing   500 mg 250 mL/hr over 60 Minutes Intravenous Every 24 hours 05/03/21 1240 05/03/21 1302   05/03/21 1330  Ampicillin-Sulbactam (UNASYN) 3 g in sodium  chloride 0.9 % 100 mL IVPB  Status:  Discontinued        3 g 200 mL/hr over 30 Minutes Intravenous Every 8 hours 05/03/21 1252 05/04/21 1113   05/03/21 0500  remdesivir 200 mg in sodium chloride 0.9% 250 mL IVPB       See Hyperspace for full Linked Orders Report.   200 mg 580 mL/hr over 30 Minutes Intravenous Once 05/03/21 0300 05/03/21 0855   05/02/21 1845  cefTRIAXone (ROCEPHIN) 2 g in sodium chloride 0.9 % 100 mL IVPB  Status:  Discontinued        2 g 200 mL/hr over 30 Minutes Intravenous Every 24 hours 05/02/21 1843 05/03/21 1238   05/02/21 1845  azithromycin (ZITHROMAX) 500 mg in sodium chloride 0.9 % 250 mL IVPB  Status:  Discontinued        500 mg 250 mL/hr over 60 Minutes Intravenous Every 24 hours 05/02/21 1843 05/03/21 1238        Time Spent in minutes  30   Lala Lund M.D on 05/05/2021 at 8:51 AM  To page go to [email or web address]   [facility] -  Office  [phone]  See all Orders from today for further details    Objective:   Vitals:   05/04/21 1937 05/04/21 2020 05/05/21 0529 05/05/21 0803  BP:  132/63 (!) 148/73   Pulse:  64 61   Resp:  17 18   Temp:  97.6 F (36.4 C) (!) 97.5 F (36.4 C)   TempSrc:  Oral Oral   SpO2: 95% 95% 93% 93%  Weight:      Height:        Wt Readings from Last 3 Encounters:  05/03/21 72.5 kg  05/02/21 76.3 kg  04/24/21 72.1 kg     Intake/Output Summary (Last 24 hours) at 05/05/2021 0851 Last data filed at 05/05/2021 0531 Gross per 24 hour  Intake 994.56 ml  Output 2550 ml  Net -1555.44 ml     Physical Exam  Awake Alert, No new F.N deficits, Normal affect [place].AT,PERRAL Supple Neck, No JVD,   Symmetrical Chest wall movement, Good air movement bilaterally, CTAB RRR,No Gallops, Rubs or new Murmurs,  +ve B.Sounds, Abd Soft, No tenderness,   No Cyanosis, Clubbing or edema    Data Review:    CBC Recent Labs  Lab 05/02/21 1436 05/03/21 0330 05/04/21 0816  WBC 16.4* 13.3* 24.0*  HGB 13.6 12.9* 13.5   HCT 42.6 40.1 40.8  PLT 305 268 270  MCV 96.4 95.2 95.6  MCH 30.8 30.6 31.6  MCHC 31.9 32.2 33.1  RDW 13.4 13.4 13.6  LYMPHSABS 0.5*  --  0.4*  MONOABS 0.7  --  0.5  EOSABS 0.0  --  0.0  BASOSABS 0.1  --  0.0    Electrolytes Recent Labs  Lab 05/02/21 1436 05/02/21 1842 05/03/21 0330 05/03/21 0857 05/04/21 0816  NA 130*  --  134*  --  135  K 4.7  --  4.6  --  4.0  CL 95*  --  98  --  96*  CO2 26  --  28  --  29  GLUCOSE 170*  --  180*  --  176*  BUN 24*  --  26*  --  30*  CREATININE 1.67*  --  1.44*  --  1.53*  CALCIUM 8.4*  --  8.3*  --  8.5*  AST 37  --   --   --  33  ALT 46*  --   --   --  42  ALKPHOS 76  --   --   --  64  BILITOT 0.9  --   --   --  0.4  ALBUMIN 2.8*  --   --   --  2.6*  MG  --   --   --   --  2.1  CRP  --   --   --  1.8* 0.9  DDIMER  --   --   --  0.32 0.37  PROCALCITON  --   --  <0.10  --   --   LATICACIDVEN  --  1.2 2.0*  --   --   INR  --  1.5*  --   --   --   BNP  --   --   --  1,313.9* 710.2*    ------------------------------------------------------------------------------------------------------------------ No results for input(s): CHOL, HDL, LDLCALC, TRIG, CHOLHDL, LDLDIRECT in the last 72 hours.  No results found for: HGBA1C  No results for input(s): TSH, T4TOTAL, T3FREE, THYROIDAB in the last 72 hours.  Invalid input(s): FREET3 ------------------------------------------------------------------------------------------------------------------ ID Labs Recent Labs  Lab 05/02/21 1436 05/02/21 1842 05/03/21 0330 05/03/21 0857 05/04/21 0816  WBC 16.4*  --  13.3*  --  24.0*  PLT 305  --  268  --  270  CRP  --   --   --  1.8* 0.9  DDIMER  --   --   --  0.32 0.37  PROCALCITON  --   --  <0.10  --   --   LATICACIDVEN  --  1.2 2.0*  --   --   CREATININE 1.67*  --  1.44*  --  1.53*   Cardiac Enzymes No results for input(s): CKMB, TROPONINI, MYOGLOBIN in the last 168 hours.  Invalid input(s): CK    Radiology Reports DG Chest 2  View  Result Date: 05/02/2021 CLINICAL DATA:  Provided history: Shortness of breath for 1 week, new hypoxia EXAM: CHEST - 2 VIEW COMPARISON:  Chest CT 10/14/2020.  Chest radiographs 05/14/2020 FINDINGS: An ovoid electronic device projects over the left chest. This may reflect a loop recorder or leadless pacer device. Heart size within normal limits. Aortic atherosclerosis. Moderate-sized airspace opacity within the right lower lobe and right infrahilar region. Additionally, there is suggestion of a subtle nodular airspace opacity within the left upper lobe. No evidence of pleural effusion or pneumothorax. No acute bony abnormality identified. IMPRESSION: Moderate-sized airspace opacity within the right lower lobe and right infrahilar region, compatible with pneumonia in the appropriate clinical setting. Followup PA and lateral chest radiographs are recommended in 3-4 weeks following trial of antibiotic therapy to ensure resolution and exclude underlying malignancy. Suggestion of a subtle nodular airspace opacity within the left upper lobe. This may also be infectious/inflammatory in etiology. However, if this finding persists at time of radiographic follow-up, a chest CT is recommended for further evaluation. Aortic Atherosclerosis (ICD10-I70.0). Electronically Signed   By: Kellie Simmering D.O.   On: 05/02/2021 11:13   CT Chest Wo Contrast  Result Date: 05/02/2021 CLINICAL DATA:  Respiratory illness, abnormal chest radiograph, hypoxia EXAM: CT CHEST WITHOUT CONTRAST TECHNIQUE: Multidetector CT imaging of the chest was performed following the standard protocol without IV contrast. COMPARISON:  Chest  radiographs dated 05/02/2021. CT chest dated 10/14/2020. FINDINGS: Cardiovascular: Heart is normal in size.  No pericardial effusion. No evidence of thoracic aortic aneurysm. Atherosclerotic calcifications of the arch. Mild coronary atherosclerosis of the LAD and right coronary artery. Mediastinum/Nodes: Small  mediastinal lymph nodes, including a 9 mm short axis AP window node (series 3/image 32), likely reactive. Visualized thyroid is unremarkable. Lungs/Pleura: 2.7 x 4.2 cm irregular/spiculated masslike opacity in the posterior right lower lobe (series 4/image 101). Mild associated hyperdensity within the lesion (series 3/image 102). Adjacent debris within right lower lobe bronchi (series 4/image 101). Additional 1.4 x 2.6 cm irregular nodular opacity in the posterior right upper lobe (series 4/image 35) and 7 x 12 mm irregular nodular opacity in the posterior left upper lobe (series 4/image 61). Additional mild irregular linear/nodular opacity in the right upper lobe (series 4/image 45). Despite the masslike appearance of the dominant lesion, the overall appearance, distribution, and ancillary findings favor multifocal infection/pneumonia, possibly on the basis of aspiration. However, this does warrant follow-up. Moderate centrilobular and paraseptal emphysematous changes, upper lung predominant. No pleural effusion or pneumothorax. Upper Abdomen: Visualized upper abdomen is notable for vascular calcifications and a 2.4 cm right upper pole renal cyst. Musculoskeletal: Mild superior endplate compression fracture deformity at L1, new from the prior. IMPRESSION: Multifocal patchy opacities including a 4.2 cm irregular/spiculated masslike opacity in the posterior right lower lobe. Overall appearance/distribution favors multifocal infection/pneumonia, possibly on the basis of aspiration. However, follow-up CT chest is suggested in 6-12 weeks (after appropriate antimicrobial therapy) to document improvement/resolution. Small mediastinal lymph nodes, likely reactive. Mild superior endplate compression fracture deformity at L1, new from the prior. Aortic Atherosclerosis (ICD10-I70.0) and Emphysema (ICD10-J43.9). Electronically Signed   By: Julian Hy M.D.   On: 05/02/2021 15:04   DG Chest Port 1 View  Result Date:  05/03/2021 CLINICAL DATA:  80 year old male with shortness of breath. Abnormal chest CT. EXAM: PORTABLE CHEST 1 VIEW COMPARISON:  Chest CT 0230 hours today and earlier. FINDINGS: Portable AP upright view at 0902 hours. Spiculated density projecting over the right inferior hilum corresponding to the dominant abnormality on CT. Otherwise normal cardiac size and mediastinal contours. Cardiac loop recorder or lead less ICD at the left chest. Visualized tracheal air column is within normal limits. Stable ventilation. No pneumothorax or pleural effusion. No acute osseous abnormality identified. IMPRESSION: 1. Unchanged from the CT at 0230 hours today with dominant spiculated opacity projecting over the right hilum. 2. No new cardiopulmonary abnormality. Electronically Signed   By: Genevie Ann M.D.   On: 05/03/2021 09:19   ECHOCARDIOGRAM LIMITED  Result Date: 05/04/2021    ECHOCARDIOGRAM LIMITED REPORT   Patient Name:   Mario Proctor Date of Exam: 05/04/2021 Medical Rec #:  [id number]       Height:       71.0 in Accession #:    [id number]      Weight:       159.8 lb Date of Birth:  05-06-1941       BSA:          1.917 m Patient Age:    15 years        BP:           139/85 mmHg Patient Gender: M               HR:           63 bpm. Exam Location:  Inpatient Procedure: Limited Echo, Cardiac Doppler and Color Doppler Indications:  CHF-Acute Systolic  History:        Patient has prior history of Echocardiogram examinations, most                 recent 11/06/2020. CHF, COPD, Arrythmias:Atrial Fibrillation;                 Risk Factors:Dyslipidemia. COVID-19. CKD.  Sonographer:    Clayton Lefort RDCS (AE) Referring Phys: [id number] Margaree Mackintosh [facility]  Sonographer Comments: COVID-19 IMPRESSIONS  1. Left ventricular ejection fraction, by estimation, is 45 to 50%. The left ventricle has mildly decreased function. The left ventricle demonstrates global hypokinesis. There is mild left ventricular hypertrophy. Left ventricular diastolic  parameters were normal.  2. Right ventricular systolic function is normal. The right ventricular size is normal.  3. The mitral valve is normal in structure. Trivial mitral valve regurgitation. No evidence of mitral stenosis.  4. The aortic valve is normal in structure. Aortic valve regurgitation is not visualized. No aortic stenosis is present.  5. Compared to previous outpatient study in 10/2020, LVEF is marginally improved from 35-40%. FINDINGS  Left Ventricle: Left ventricular ejection fraction, by estimation, is 45 to 50%. The left ventricle has mildly decreased function. The left ventricle demonstrates global hypokinesis. There is mild left ventricular hypertrophy. Left ventricular diastolic  parameters were normal. Right Ventricle: The right ventricular size is normal. No increase in right ventricular wall thickness. Right ventricular systolic function is normal. Left Atrium: Left atrial size was normal in size. Right Atrium: Right atrial size was not assessed. Mitral Valve: The mitral valve is normal in structure. Trivial mitral valve regurgitation. No evidence of mitral valve stenosis. MV peak gradient, 2.1 mmHg. The mean mitral valve gradient is 1.0 mmHg. Tricuspid Valve: The tricuspid valve is normal in structure. Tricuspid valve regurgitation is not demonstrated. No evidence of tricuspid stenosis. Aortic Valve: The aortic valve is normal in structure. Aortic valve regurgitation is not visualized. No aortic stenosis is present. Aortic valve mean gradient measures 3.0 mmHg. Aortic valve peak gradient measures 5.9 mmHg. Aortic valve area, by VTI measures 3.25 cm. Aorta: The aortic root is normal in size and structure. LEFT VENTRICLE PLAX 2D LVIDd:         4.80 cm     Diastology LVIDs:         3.50 cm     LV e' medial:    5.68 cm/s LV PW:         1.60 cm     LV E/e' medial:  13.1 LV IVS:        1.30 cm     LV e' lateral:   7.54 cm/s LVOT diam:     2.30 cm     LV E/e' lateral: 9.9 LV SV:         86 LV SV  Index:   45 LVOT Area:     4.15 cm  LV Volumes (MOD) LV vol d, MOD A2C: 91.1 ml LV vol d, MOD A4C: 74.3 ml LV vol s, MOD A2C: 46.6 ml LV vol s, MOD A4C: 36.5 ml LV SV MOD A2C:     44.5 ml LV SV MOD A4C:     74.3 ml LV SV MOD BP:      45.3 ml IVC IVC diam: 1.60 cm LEFT ATRIUM           Index LA diam:      3.30 cm 1.72 cm/m LA Vol (A2C): 35.1 ml 18.31 ml/m LA Vol (A4C): 24.2 ml  12.62 ml/m  AORTIC VALVE AV Area (Vmax):    3.67 cm AV Area (Vmean):   3.23 cm AV Area (VTI):     3.25 cm AV Vmax:           121.00 cm/s AV Vmean:          84.200 cm/s AV VTI:            0.266 m AV Peak Grad:      5.9 mmHg AV Mean Grad:      3.0 mmHg LVOT Vmax:         107.00 cm/s LVOT Vmean:        65.500 cm/s LVOT VTI:          0.208 m LVOT/AV VTI ratio: 0.78  AORTA Ao Root diam: 3.60 cm Ao Asc diam:  3.60 cm MITRAL VALVE MV Area (PHT): 2.52 cm    SHUNTS MV Area VTI:   3.72 cm    Systemic VTI:  0.21 m MV Peak grad:  2.1 mmHg    Systemic Diam: 2.30 cm MV Mean grad:  1.0 mmHg MV Vmax:       0.72 m/s MV Vmean:      44.9 cm/s MV Decel Time: 301 msec MV E velocity: 74.40 cm/s MV A velocity: 73.90 cm/s MV E/A ratio:  1.01 Manish Patwardhan MD Electronically signed by Vernell Leep MD Signature Date/Time: 05/04/2021/2:43:09 PM    Final

## 2021-05-05 NOTE — Progress Notes (Signed)
Called lab to let them know patient's lab were not collected this morning. Per lab tech,they are running behind due to staffing issues.

## 2021-05-06 DIAGNOSIS — I5042 Chronic combined systolic (congestive) and diastolic (congestive) heart failure: Secondary | ICD-10-CM | POA: Diagnosis not present

## 2021-05-06 DIAGNOSIS — J9601 Acute respiratory failure with hypoxia: Secondary | ICD-10-CM | POA: Diagnosis not present

## 2021-05-06 LAB — CBC WITH DIFFERENTIAL/PLATELET
Abs Immature Granulocytes: 0.18 10*3/uL — ABNORMAL HIGH (ref 0.00–0.07)
Basophils Absolute: 0 10*3/uL (ref 0.0–0.1)
Basophils Relative: 0 %
Eosinophils Absolute: 0 10*3/uL (ref 0.0–0.5)
Eosinophils Relative: 0 %
HCT: 41.4 % (ref 39.0–52.0)
Hemoglobin: 13.5 g/dL (ref 13.0–17.0)
Immature Granulocytes: 1 %
Lymphocytes Relative: 8 %
Lymphs Abs: 1.2 10*3/uL (ref 0.7–4.0)
MCH: 31.4 pg (ref 26.0–34.0)
MCHC: 32.6 g/dL (ref 30.0–36.0)
MCV: 96.3 fL (ref 80.0–100.0)
Monocytes Absolute: 1.1 10*3/uL — ABNORMAL HIGH (ref 0.1–1.0)
Monocytes Relative: 8 %
Neutro Abs: 12.2 10*3/uL — ABNORMAL HIGH (ref 1.7–7.7)
Neutrophils Relative %: 83 %
Platelets: 225 10*3/uL (ref 150–400)
RBC: 4.3 MIL/uL (ref 4.22–5.81)
RDW: 13.6 % (ref 11.5–15.5)
WBC: 14.8 10*3/uL — ABNORMAL HIGH (ref 4.0–10.5)
nRBC: 0 % (ref 0.0–0.2)

## 2021-05-06 LAB — COMPREHENSIVE METABOLIC PANEL
ALT: 38 U/L (ref 0–44)
AST: 24 U/L (ref 15–41)
Albumin: 2.4 g/dL — ABNORMAL LOW (ref 3.5–5.0)
Alkaline Phosphatase: 64 U/L (ref 38–126)
Anion gap: 6 (ref 5–15)
BUN: 33 mg/dL — ABNORMAL HIGH (ref 8–23)
CO2: 34 mmol/L — ABNORMAL HIGH (ref 22–32)
Calcium: 7.9 mg/dL — ABNORMAL LOW (ref 8.9–10.3)
Chloride: 95 mmol/L — ABNORMAL LOW (ref 98–111)
Creatinine, Ser: 1.38 mg/dL — ABNORMAL HIGH (ref 0.61–1.24)
GFR, Estimated: 52 mL/min — ABNORMAL LOW (ref >60)
Glucose, Bld: 80 mg/dL (ref 70–99)
Potassium: 3.8 mmol/L (ref 3.5–5.1)
Sodium: 135 mmol/L (ref 135–145)
Total Bilirubin: 0.8 mg/dL (ref 0.3–1.2)
Total Protein: 5.3 g/dL — ABNORMAL LOW (ref 6.5–8.1)

## 2021-05-06 LAB — C-REACTIVE PROTEIN: CRP: 0.6 mg/dL (ref <1.0)

## 2021-05-06 LAB — MAGNESIUM: Magnesium: 2 mg/dL (ref 1.7–2.4)

## 2021-05-06 LAB — D-DIMER, QUANTITATIVE: D-Dimer, Quant: <0.27 ug/mL-FEU (ref 0.00–0.50)

## 2021-05-06 LAB — BRAIN NATRIURETIC PEPTIDE: B Natriuretic Peptide: 292.4 pg/mL — ABNORMAL HIGH (ref 0.0–100.0)

## 2021-05-06 MED ORDER — METHYLPREDNISOLONE SODIUM SUCC 40 MG IJ SOLR
40.0000 mg | Freq: Every day | INTRAMUSCULAR | Status: DC
  Administered 2021-05-06: 40 mg via INTRAVENOUS
  Filled 2021-05-06: qty 1

## 2021-05-06 MED ORDER — ISOSORBIDE MONONITRATE ER 30 MG PO TB24: 30.0000 mg | ORAL_TABLET | Freq: Every day | ORAL | 0 refills | Status: DC

## 2021-05-06 MED ORDER — FUROSEMIDE 10 MG/ML IJ SOLN
40.0000 mg | Freq: Once | INTRAMUSCULAR | Status: AC
  Administered 2021-05-06: 40 mg via INTRAVENOUS
  Filled 2021-05-06: qty 4

## 2021-05-06 MED ORDER — SPIRONOLACTONE 25 MG PO TABS: 25.0000 mg | ORAL_TABLET | Freq: Every day | ORAL | 0 refills | Status: DC

## 2021-05-06 MED ORDER — FUROSEMIDE 20 MG PO TABS: 20.0000 mg | ORAL_TABLET | Freq: Every day | ORAL | 0 refills | Status: DC

## 2021-05-06 MED ORDER — POTASSIUM CHLORIDE CRYS ER 20 MEQ PO TBCR
40.0000 meq | EXTENDED_RELEASE_TABLET | Freq: Once | ORAL | Status: AC
  Administered 2021-05-06: 40 meq via ORAL
  Filled 2021-05-06: qty 2

## 2021-05-06 NOTE — Discharge Instructions (Addendum)
Follow with Primary MD Tamsen Roers, MD within 3 to 4 days and your cardiologist within a week.  Get CBC, CMP, 2 view Chest X ray -  checked next visit within 3-4 days by Primary MD    Activity: As tolerated with Full fall precautions use walker/cane & assistance as needed  Disposition Home    Diet: Heart Healthy, Check your Weight same time everyday, if you gain over 2 pounds, or you develop in leg swelling, experience more shortness of breath or chest pain, call your Primary MD immediately. Follow Cardiac Low Salt Diet and 1.5 lit/day fluid restriction.  Special Instructions: If you have smoked or chewed Tobacco  in the last 2 yrs please stop smoking, stop any regular Alcohol  and or any Recreational drug use.  On your next visit with your primary care physician please Get Medicines reviewed and adjusted.  Please request your Prim.MD to go over all Hospital Tests and Procedure/Radiological results at the follow up, please get all Hospital records sent to your Prim MD by signing hospital release before you go home.  If you experience worsening of your admission symptoms, develop shortness of breath, life threatening emergency, suicidal or homicidal thoughts you must seek medical attention immediately by calling 911 or calling your MD immediately  if symptoms less severe.  You Must read complete instructions/literature along with all the possible adverse reactions/side effects for all the Medicines you take and that have been prescribed to you. Take any new Medicines after you have completely understood and accpet all the possible adverse reactions/side effects.   Information on my medicine - XARELTO (Rivaroxaban)  This medication education was reviewed with me or my healthcare representative as part of my discharge preparation.  The pharmacist that spoke with me during my hospital stay was:    Why was Xarelto prescribed for you? Xarelto was prescribed for you to reduce the risk of a  blood clot forming that can cause a stroke if you have a medical condition called atrial fibrillation (a type of irregular heartbeat).  What do you need to know about xarelto ? Take your Xarelto ONCE DAILY at the same time every day with your evening meal. If you have difficulty swallowing the tablet whole, you may crush it and mix in applesauce just prior to taking your dose.  Take Xarelto exactly as prescribed by your doctor and DO NOT stop taking Xarelto without talking to the doctor who prescribed the medication.  Stopping without other stroke prevention medication to take the place of Xarelto may increase your risk of developing a clot that causes a stroke.  Refill your prescription before you run out.  After discharge, you should have regular check-up appointments with your healthcare provider that is prescribing your Xarelto.  In the future your dose may need to be changed if your kidney function or weight changes by a significant amount.  What do you do if you miss a dose? If you are taking Xarelto ONCE DAILY and you miss a dose, take it as soon as you remember on the same day then continue your regularly scheduled once daily regimen the next day. Do not take two doses of Xarelto at the same time or on the same day.   Important Safety Information A possible side effect of Xarelto is bleeding. You should call your healthcare provider right away if you experience any of the following: Bleeding from an injury or your nose that does not stop. Unusual colored urine (red or dark  brown) or unusual colored stools (red or black). Unusual bruising for unknown reasons. A serious fall or if you hit your head (even if there is no bleeding).  Some medicines may interact with Xarelto and might increase your risk of bleeding while on Xarelto. To help avoid this, consult your healthcare provider or pharmacist prior to using any new prescription or non-prescription medications, including  herbals, vitamins, non-steroidal anti-inflammatory drugs (NSAIDs) and supplements.  This website has more information on Xarelto: [email or web address]

## 2021-05-06 NOTE — Progress Notes (Signed)
DISCHARGE NOTE HOME MAUDE GLOOR to be discharged Home per MD order. Discussed prescriptions and follow up appointments with the patient. Prescriptions given to patient; medication list explained in detail. Patient verbalized understanding.  Skin clean, dry and intact without evidence of skin break down, no evidence of skin tears noted. IV catheter discontinued intact. Site without signs and symptoms of complications. Dressing and pressure applied. Pt denies pain at the site currently. No complaints noted.  Patient free of lines, drains, and wounds.   An After Visit Summary (AVS) was printed and given to the patient. Patient escorted via wheelchair, and discharged home via private auto.  Osei Anger S Fardeen Steinberger, RN

## 2021-05-06 NOTE — Discharge Summary (Signed)
VANDEN FAWAZ HUD:[id number] DOB: [birth year] DOA: 05/02/2021  PCP: Tamsen Roers, MD  Admit date: 05/02/2021  Discharge date: 05/06/2021  Admitted From: Home   Disposition:  Home   Recommendations for Outpatient Follow-up:   Follow up with PCP in 1-2 weeks  PCP Please obtain BMP/CBC, 2 view CXR in 1week,  (see Discharge instructions)   PCP Please follow up on the following pending results: monitor BMP, fluid status closely, kindly recheck electrolytes in 3 to 4 days.   Home Health: None  Equipment/Devices: 2 lit o2 PRN  Consultations: None  Discharge Condition: Stable    CODE STATUS: Full    Diet Recommendation: Heart Healthy 1.5 Lit fluid restriction/day  Diet Order             Diet Heart Room service appropriate? Yes; Fluid consistency: Thin; Fluid restriction: 1200 mL Fluid  Diet effective now                    Chief Complaint  Patient presents with   Shortness of Breath   Pneumonia     Brief history of present illness from the day of admission and additional interim summary    DARWYN PONZO is a 80 y.o. male with history of chronic combined systolic and diastolic CHF, COPD, atrial fibrillation, hyperlipidemia and chronic kidney disease stage III has been experiencing increasing shortness of breath and nonproductive cough for the last 1 week, he had incidental finding of COVID-19 infection CT chest showed multifocal pattern of interstitial infiltrate.  He was admitted for pneumonia and acute hypoxic respiratory failure                                                                 Hospital Course   Acute Hypoxic Resp. Failure due to Acute Covid 19 Viral Pneumonitis vs CAP/CHF - he is fully vaccinated for COVID-19, CT scan and chest x-ray showed multifocal pattern which most likely were  due to CHF, however he received a short course of steroids and Remdesivir although I doubt that he had COVID-19 pneumonia.   I think the main component of his hypoxic respiratory failure  is CHF, he is much improved after IV Lasix on 05/03/2021 which will be repeated, clinically much improved on room air at rest, upon prolonged ambulation does require 1 to 2 L of oxygen at times which she will get upon discharge   2.  Acute on chronic CHF systolic, last known EF 45 % up from 35%.  Noted echocardiogram, much improved with diuresis with Lasix, continue low-dose Lasix and Aldactone upon discharge along with his home beta-blocker, Delene Loll, Farxiga.  Requested to be on fluid and salt restriction and follow with PCP and cardiologist within a week of discharge.      3.  CKD 3B.  Baseline creatinine around 2.5.  Monitor with diuresis   4.  Dyslipidemia.  On statin continue.   5.  Nonspecific CT chest findings.  Outpatient follow-up with pulmonary within 3 to 4 weeks of discharge with outpatient CT scan.   6.  COPD.  Question mild exacerbation.  Given short course of steroids no wheezing now.   7.  Paroxysmal atrial fibrillation.  Mali vas 2 score of 4.  Continue amiodarone beta-blocker and Xarelto.     Discharge diagnosis     Principal Problem:   CAP (community acquired pneumonia) Active Problems:   Cardiomyopathy, ischemic   Chronic combined systolic and diastolic CHF (congestive heart failure) (HCC)   A-fib (HCC)   COPD with acute exacerbation ([place])   Pneumonia due to COVID-19 virus    Discharge instructions    Discharge Instructions     Discharge instructions   Complete by: As directed    Follow with Primary MD Tamsen Roers, MD within 3 to 4 days and your cardiologist within a week.  Get CBC, CMP, 2 view Chest X ray -  checked next visit within 3-4 days by Primary MD    Activity: As tolerated with Full fall precautions use walker/cane & assistance as needed  Disposition Home     Diet: Heart Healthy, Check your Weight same time everyday, if you gain over 2 pounds, or you develop in leg swelling, experience more shortness of breath or chest pain, call your Primary MD immediately. Follow Cardiac Low Salt Diet and 1.5 lit/day fluid restriction.  Special Instructions: If you have smoked or chewed Tobacco  in the last 2 yrs please stop smoking, stop any regular Alcohol  and or any Recreational drug use.  On your next visit with your primary care physician please Get Medicines reviewed and adjusted.  Please request your Prim.MD to go over all Hospital Tests and Procedure/Radiological results at the follow up, please get all Hospital records sent to your Prim MD by signing hospital release before you go home.  If you experience worsening of your admission symptoms, develop shortness of breath, life threatening emergency, suicidal or homicidal thoughts you must seek medical attention immediately by calling 911 or calling your MD immediately  if symptoms less severe.  You Must read complete instructions/literature along with all the possible adverse reactions/side effects for all the Medicines you take and that have been prescribed to you. Take any new Medicines after you have completely understood and accpet all the possible adverse reactions/side effects.   Increase activity slowly   Complete by: As directed        Discharge Medications   Allergies as of 05/06/2021   No Known Allergies      Medication List     STOP taking these medications    predniSONE 10 MG (48) Tbpk tablet Commonly known as: STERAPRED UNI-PAK 48 TAB       TAKE these medications    albuterol 1.25 MG/3ML nebulizer solution Commonly known as: ACCUNEB Take 1 ampule by nebulization every 4 (four) hours as needed for wheezing or shortness of breath. What changed: Another medication with the same name was changed. Make sure you understand how and when to take each.   albuterol 108 (90 Base)  MCG/ACT inhaler Commonly known as: VENTOLIN HFA INHALE 2 PUFF BY MOUTH EVERY 6 HOURS AS NEEDED FOR WHEEZE OR SHORTNESS OF BREATH What changed: See the new instructions.   amiodarone 100 MG tablet Commonly known as: PACERONE Take 1 tablet (100 mg total)  by mouth daily.   dapagliflozin propanediol 10 MG Tabs tablet Commonly known as: Farxiga Take 1 tablet (10 mg total) by mouth daily before breakfast.   diphenhydrAMINE 25 MG tablet Commonly known as: BENADRYL Take 25 mg by mouth every 6 (six) hours as needed.   Entresto 97-103 MG Generic drug: sacubitril-valsartan TAKE 1 TABLET BY MOUTH EVERY DAY   furosemide 20 MG tablet Commonly known as: Lasix Take 1 tablet (20 mg total) by mouth daily.   isosorbide mononitrate 30 MG 24 hr tablet Commonly known as: IMDUR Take 1 tablet (30 mg total) by mouth daily.   metoprolol succinate 25 MG 24 hr tablet Commonly known as: TOPROL-XL Take 1 tablet (25 mg total) by mouth daily.   rosuvastatin 5 MG tablet Commonly known as: CRESTOR TAKE 1 TABLET BY MOUTH EVERY DAY What changed: how much to take   spironolactone 25 MG tablet Commonly known as: Aldactone Take 1 tablet (25 mg total) by mouth daily.   Trelegy Ellipta 100-62.5-25 MCG/ACT Aepb Generic drug: Fluticasone-Umeclidin-Vilant TAKE 1 PUFF BY MOUTH EVERY DAY What changed: See the new instructions.   triamcinolone cream 0.1 % Commonly known as: KENALOG Apply 1 application topically 2 (two) times daily.   Xarelto 20 MG Tabs tablet Generic drug: rivaroxaban TAKE 1 TABLET BY MOUTH EVERY DAY WITH SUPPER What changed: See the new instructions.               Durable Medical Equipment  (From admission, onward)           Start     Ordered   05/06/21 0952  For home use only DME oxygen  Once       Question Answer Comment  Length of Need 6 Months   Mode or (Route) Nasal cannula   Liters per Minute 2   Frequency Continuous (stationary and portable oxygen unit needed)    Oxygen conserving device Yes   Oxygen delivery system Gas      05/06/21 0951             Follow-up Information     Little, Jeneen Rinks, MD. Schedule an appointment as soon as possible for a visit in 1 week(s).   Specialty: Family Medicine Contact information: [address] [place] [address] [phone]         [name], MD. Schedule an appointment as soon as possible for a visit in 1 week(s).   Specialty: Cardiology Contact information: [place] [address] [phone]                 Major procedures and Radiology Reports - PLEASE review detailed and final reports thoroughly  -       DG Chest 2 View  Result Date: 05/02/2021 CLINICAL DATA:  Provided history: Shortness of breath for 1 week, new hypoxia EXAM: CHEST - 2 VIEW COMPARISON:  Chest CT 10/14/2020.  Chest radiographs 05/14/2020 FINDINGS: An ovoid electronic device projects over the left chest. This may reflect a loop recorder or leadless pacer device. Heart size within normal limits. Aortic atherosclerosis. Moderate-sized airspace opacity within the right lower lobe and right infrahilar region. Additionally, there is suggestion of a subtle nodular airspace opacity within the left upper lobe. No evidence of pleural effusion or pneumothorax. No acute bony abnormality identified. IMPRESSION: Moderate-sized airspace opacity within the right lower lobe and right infrahilar region, compatible with pneumonia in the appropriate clinical setting. Followup PA and lateral chest radiographs are recommended in 3-4  weeks following trial of antibiotic therapy to ensure resolution and exclude underlying malignancy. Suggestion of a subtle nodular airspace opacity within the left upper lobe. This may also be infectious/inflammatory in etiology. However, if this finding persists at time of radiographic follow-up, a chest CT is recommended for further evaluation. Aortic Atherosclerosis (ICD10-I70.0).  Electronically Signed   By: Kellie Simmering D.O.   On: 05/02/2021 11:13   CT Chest Wo Contrast  Result Date: 05/02/2021 CLINICAL DATA:  Respiratory illness, abnormal chest radiograph, hypoxia EXAM: CT CHEST WITHOUT CONTRAST TECHNIQUE: Multidetector CT imaging of the chest was performed following the standard protocol without IV contrast. COMPARISON:  Chest radiographs dated 05/02/2021. CT chest dated 10/14/2020. FINDINGS: Cardiovascular: Heart is normal in size.  No pericardial effusion. No evidence of thoracic aortic aneurysm. Atherosclerotic calcifications of the arch. Mild coronary atherosclerosis of the LAD and right coronary artery. Mediastinum/Nodes: Small mediastinal lymph nodes, including a 9 mm short axis AP window node (series 3/image 32), likely reactive. Visualized thyroid is unremarkable. Lungs/Pleura: 2.7 x 4.2 cm irregular/spiculated masslike opacity in the posterior right lower lobe (series 4/image 101). Mild associated hyperdensity within the lesion (series 3/image 102). Adjacent debris within right lower lobe bronchi (series 4/image 101). Additional 1.4 x 2.6 cm irregular nodular opacity in the posterior right upper lobe (series 4/image 35) and 7 x 12 mm irregular nodular opacity in the posterior left upper lobe (series 4/image 61). Additional mild irregular linear/nodular opacity in the right upper lobe (series 4/image 45). Despite the masslike appearance of the dominant lesion, the overall appearance, distribution, and ancillary findings favor multifocal infection/pneumonia, possibly on the basis of aspiration. However, this does warrant follow-up. Moderate centrilobular and paraseptal emphysematous changes, upper lung predominant. No pleural effusion or pneumothorax. Upper Abdomen: Visualized upper abdomen is notable for vascular calcifications and a 2.4 cm right upper pole renal cyst. Musculoskeletal: Mild superior endplate compression fracture deformity at L1, new from the prior. IMPRESSION:  Multifocal patchy opacities including a 4.2 cm irregular/spiculated masslike opacity in the posterior right lower lobe. Overall appearance/distribution favors multifocal infection/pneumonia, possibly on the basis of aspiration. However, follow-up CT chest is suggested in 6-12 weeks (after appropriate antimicrobial therapy) to document improvement/resolution. Small mediastinal lymph nodes, likely reactive. Mild superior endplate compression fracture deformity at L1, new from the prior. Aortic Atherosclerosis (ICD10-I70.0) and Emphysema (ICD10-J43.9). Electronically Signed   By: Julian Hy M.D.   On: 05/02/2021 15:04   DG Chest Port 1 View  Result Date: 05/05/2021 CLINICAL DATA:  Shortness of breath, COVID EXAM: PORTABLE CHEST 1 VIEW COMPARISON:  05/03/2021 FINDINGS: Loop recorder remains in place. Heart is normal size. Previously seen right spiculated mass projecting over the right hilum not as well visualized on today's study. No new infiltrates or effusions. No acute bone abnormality. IMPRESSION: Previously seen spiculated right lung mass by CT and prior plain films not as well visualized on today's study. No new/acute infiltrates/consolidation. Electronically Signed   By: Rolm Baptise M.D.   On: 05/05/2021 13:33   DG Chest Port 1 View  Result Date: 05/03/2021 CLINICAL DATA:  80 year old male with shortness of breath. Abnormal chest CT. EXAM: PORTABLE CHEST 1 VIEW COMPARISON:  Chest CT 0230 hours today and earlier. FINDINGS: Portable AP upright view at 0902 hours. Spiculated density projecting over the right inferior hilum corresponding to the dominant abnormality on CT. Otherwise normal cardiac size and mediastinal contours. Cardiac loop recorder or lead less ICD at the left chest. Visualized tracheal air column is within normal limits. Stable  ventilation. No pneumothorax or pleural effusion. No acute osseous abnormality identified. IMPRESSION: 1. Unchanged from the CT at 0230 hours today with  dominant spiculated opacity projecting over the right hilum. 2. No new cardiopulmonary abnormality. Electronically Signed   By: Genevie Ann M.D.   On: 05/03/2021 09:19   ECHOCARDIOGRAM LIMITED  Result Date: 05/04/2021    ECHOCARDIOGRAM LIMITED REPORT   Patient Name:   QUENCY TOBER Date of Exam: 05/04/2021 Medical Rec #:  [id number]       Height:       71.0 in Accession #:    [id number]      Weight:       159.8 lb Date of Birth:  [birth year]       BSA:          1.917 m Patient Age:    6 years        BP:           139/85 mmHg Patient Gender: M               HR:           63 bpm. Exam Location:  Inpatient Procedure: Limited Echo, Cardiac Doppler and Color Doppler Indications:    CHF-Acute Systolic  History:        Patient has prior history of Echocardiogram examinations, most                 recent 11/06/2020. CHF, COPD, Arrythmias:Atrial Fibrillation;                 Risk Factors:Dyslipidemia. COVID-19. CKD.  Sonographer:    Clayton Lefort RDCS (AE) Referring Phys: [id number] Margaree Mackintosh [facility]  Sonographer Comments: COVID-19 IMPRESSIONS  1. Left ventricular ejection fraction, by estimation, is 45 to 50%. The left ventricle has mildly decreased function. The left ventricle demonstrates global hypokinesis. There is mild left ventricular hypertrophy. Left ventricular diastolic parameters were normal.  2. Right ventricular systolic function is normal. The right ventricular size is normal.  3. The mitral valve is normal in structure. Trivial mitral valve regurgitation. No evidence of mitral stenosis.  4. The aortic valve is normal in structure. Aortic valve regurgitation is not visualized. No aortic stenosis is present.  5. Compared to previous outpatient study in 10/2020, LVEF is marginally improved from 35-40%. FINDINGS  Left Ventricle: Left ventricular ejection fraction, by estimation, is 45 to 50%. The left ventricle has mildly decreased function. The left ventricle demonstrates global hypokinesis. There is mild left ventricular  hypertrophy. Left ventricular diastolic  parameters were normal. Right Ventricle: The right ventricular size is normal. No increase in right ventricular wall thickness. Right ventricular systolic function is normal. Left Atrium: Left atrial size was normal in size. Right Atrium: Right atrial size was not assessed. Mitral Valve: The mitral valve is normal in structure. Trivial mitral valve regurgitation. No evidence of mitral valve stenosis. MV peak gradient, 2.1 mmHg. The mean mitral valve gradient is 1.0 mmHg. Tricuspid Valve: The tricuspid valve is normal in structure. Tricuspid valve regurgitation is not demonstrated. No evidence of tricuspid stenosis. Aortic Valve: The aortic valve is normal in structure. Aortic valve regurgitation is not visualized. No aortic stenosis is present. Aortic valve mean gradient measures 3.0 mmHg. Aortic valve peak gradient measures 5.9 mmHg. Aortic valve area, by VTI measures 3.25 cm. Aorta: The aortic root is normal in size and structure. LEFT VENTRICLE PLAX 2D LVIDd:         4.80 cm  Diastology LVIDs:         3.50 cm     LV e' medial:    5.68 cm/s LV PW:         1.60 cm     LV E/e' medial:  13.1 LV IVS:        1.30 cm     LV e' lateral:   7.54 cm/s LVOT diam:     2.30 cm     LV E/e' lateral: 9.9 LV SV:         86 LV SV Index:   45 LVOT Area:     4.15 cm  LV Volumes (MOD) LV vol d, MOD A2C: 91.1 ml LV vol d, MOD A4C: 74.3 ml LV vol s, MOD A2C: 46.6 ml LV vol s, MOD A4C: 36.5 ml LV SV MOD A2C:     44.5 ml LV SV MOD A4C:     74.3 ml LV SV MOD BP:      45.3 ml IVC IVC diam: 1.60 cm LEFT ATRIUM           Index LA diam:      3.30 cm 1.72 cm/m LA Vol (A2C): 35.1 ml 18.31 ml/m LA Vol (A4C): 24.2 ml 12.62 ml/m  AORTIC VALVE AV Area (Vmax):    3.67 cm AV Area (Vmean):   3.23 cm AV Area (VTI):     3.25 cm AV Vmax:           121.00 cm/s AV Vmean:          84.200 cm/s AV VTI:            0.266 m AV Peak Grad:      5.9 mmHg AV Mean Grad:      3.0 mmHg LVOT Vmax:         107.00 cm/s  LVOT Vmean:        65.500 cm/s LVOT VTI:          0.208 m LVOT/AV VTI ratio: 0.78  AORTA Ao Root diam: 3.60 cm Ao Asc diam:  3.60 cm MITRAL VALVE MV Area (PHT): 2.52 cm    SHUNTS MV Area VTI:   3.72 cm    Systemic VTI:  0.21 m MV Peak grad:  2.1 mmHg    Systemic Diam: 2.30 cm MV Mean grad:  1.0 mmHg MV Vmax:       0.72 m/s MV Vmean:      44.9 cm/s MV Decel Time: 301 msec MV E velocity: 74.40 cm/s MV A velocity: 73.90 cm/s MV E/A ratio:  1.01 Manish Patwardhan MD Electronically signed by Vernell Leep MD Signature Date/Time: 05/04/2021/2:43:09 PM    Final      Today   Subjective    Antony Blackbird today has no headache,no chest abdominal pain,no new weakness tingling or numbness, feels much better wants to go home today.     Objective   Blood pressure 119/74, pulse 64, temperature 97.7 F (36.5 C), temperature source Oral, resp. rate 20, height 5\' 11"  (1.803 m), weight 72.5 kg, SpO2 93 %.   Intake/Output Summary (Last 24 hours) at 05/06/2021 0952 Last data filed at 05/05/2021 2200 Gross per 24 hour  Intake 840 ml  Output --  Net 840 ml    Exam  Awake Alert, No new F.N deficits, Normal affect [place].AT,PERRAL Supple Neck,No JVD, No cervical lymphadenopathy appriciated.  Symmetrical Chest wall movement, Good air movement bilaterally, CTAB RRR,No Gallops,Rubs or new Murmurs, No Parasternal Heave +ve B.Sounds, Abd Soft,  Non tender, No organomegaly appriciated, No rebound -guarding or rigidity. No Cyanosis, Clubbing or edema, No new Rash or bruise   Data Review   CBC w Diff:  Lab Results  Component Value Date   WBC 14.8 (H) 05/06/2021   HGB 13.5 05/06/2021   HGB 14.5 06/20/2020   HCT 41.4 05/06/2021   HCT 42.2 06/20/2020   PLT 225 05/06/2021   PLT 243 06/20/2020   LYMPHOPCT 8 05/06/2021   MONOPCT 8 05/06/2021   EOSPCT 0 05/06/2021   BASOPCT 0 05/06/2021    CMP:  Lab Results  Component Value Date   NA 135 05/06/2021   NA 137 04/07/2021   K 3.8 05/06/2021   CL 95 (L)  05/06/2021   CO2 34 (H) 05/06/2021   BUN 33 (H) 05/06/2021   BUN 20 04/07/2021   CREATININE 1.38 (H) 05/06/2021   PROT 5.3 (L) 05/06/2021   PROT 6.5 03/27/2020   ALBUMIN 2.4 (L) 05/06/2021   ALBUMIN 3.9 03/27/2020   BILITOT 0.8 05/06/2021   BILITOT 0.7 03/27/2020   ALKPHOS 64 05/06/2021   AST 24 05/06/2021   ALT 38 05/06/2021  .   Total Time in preparing paper work, data evaluation and todays exam - 36 minutes  Lala Lund M.D on 05/06/2021 at 9:52 AM  [facility]

## 2021-05-06 NOTE — TOC Transition Note (Signed)
Transition of Care [facility]) - CM/SW Discharge Note   Patient Details  Name: ABDULMALIK DARCO MRN: [id number] Date of Birth: Nov 08, 1940  [facility]) CM/SW Contact:  Tom-Johnson, Renea Ee, RN Phone Number: 05/06/2021, 11:09 AM   Clinical Narrative:    Patient is scheduled for discharge today. Spoke with patient and he chose [place] for home Oxygen. CM spoke with Thedore Mins and he accepted order. Wife to transport at discharge. No further TOC needs noted.   Final next level of care: Home/Self Care Barriers to Discharge: Barriers Resolved   Patient Goals and CMS Choice Patient states their goals for this hospitalization and ongoing recovery are:: To go home CMS Medicare.gov Compare Post Acute Care list provided to:: Patient Choice offered to / list presented to : Patient  Discharge Placement                       Discharge Plan and Services                DME Arranged: Oxygen DME Agency: [employer] Date DME Agency Contacted: 05/06/21 Time DME Agency Contacted: 1100 Representative spoke with at DME Agency: Thedore Mins HH Arranged: NA [place] Agency: NA        Social Determinants of Health ([place]) Interventions     Readmission Risk Interventions No flowsheet data found.

## 2021-05-06 NOTE — Progress Notes (Signed)
SATURATION QUALIFICATIONS: (This note is used to comply with regulatory documentation for home oxygen)  Patient Saturations on Room Air at Rest = 91%  Patient Saturations on Room Air while Ambulating = 83%  Patient Saturations on 2 Liters of oxygen while Ambulating = 94%  Please briefly explain why patient needs home oxygen: pt oxygen went down when ambulating with no oxygen

## 2021-05-07 DIAGNOSIS — R0602 Shortness of breath: Secondary | ICD-10-CM | POA: Diagnosis not present

## 2021-05-07 LAB — CULTURE, BLOOD (ROUTINE X 2)
Culture: NO GROWTH
Culture: NO GROWTH
Special Requests: ADEQUATE

## 2021-05-13 NOTE — Progress Notes (Signed)
Primary Physician/Referring:  Tamsen Roers, MD  Patient ID: Mario Proctor, male    DOB: June 11, 1940, 81 y.o.   MRN: [id number]  Chief Complaint  Patient presents with   Chronic combined systolic and diastolic CHF    Atrial Fibrillation   Hospitalization Follow-up   HPI:    Mario Proctor  is a 81 y.o. Caucasian male patient  with nonischemic cardiomyopathy by coronary angiogram on 11/02/2016 revealing ejection fraction of 30-35%, ongoning tobacco use disorder, and permanent A fib. He is asymptomatic in regards to his A fib and is now on anticoagulation.  Echocardiogram 03/2020 revealed significantly decreased LVEF at 25%.  Patient was therefore started on amiodarone and underwent successful cardioversion 07/30/2020.  Patient was admitted 05/03/2021 - 05/06/2021 with COVID-19 pneumonia and acute on chronic systolic heart failure.  Patient was treated with steroids and remdesivir and diuresed with IV Lasix, dyspnea significantly improved and patient was sent home with supplemental oxygen.  Notably echocardiogram during hospitalization showed mild improvement of LVEF from 35-40% to 40 5-50% following discharged on 05/09/2021 patient had an episode of near syncope. He was standing and began to feel weak and dizzy. No LOC but patient did fall and remain laying on the ground until EMS arrived. At the time of EMS arrival patient's blood pressure was 100/60 mmHg and he was in normal rhythm. Following this episode patient has stopped taking Lasix and has had no recurrence of dizziness or weakness.  Patient has had no known recurrence of atrial fibrillation.  Denies chest pain, palpitations, dyspnea, leg swelling, orthopnea, PND.  He continues to tolerate anticoagulation without bleeding diathesis.  He does continue to use supplement oxygen at home with exertion, 2 L/min. He has upcoming follow up with pulmonology.   Past Medical History:  Diagnosis Date   A-fib [facility])    Allergy    CHF (congestive  heart failure) (HCC)    Dupuytren contracture    right sm finger   Myocardial infarct ([place]) 2019   Myocardial infarction ([place])    Small bowel obstruction [facility])    Past Surgical History:  Procedure Laterality Date   CARDIOVERSION N/A 07/30/2020   Procedure: CARDIOVERSION;  Surgeon: [name], MD;  Location: [place];  Service: Cardiovascular;  Laterality: N/A;   COLON SURGERY     bowel blockage   DUPUYTREN CONTRACTURE RELEASE Left 2011   FASCIECTOMY Right 05/02/2015   Procedure: FASCIECTOMY RIGHT SMALL FINGER;  Surgeon: Daryll Brod, MD;  Location: [place];  Service: Orthopedics;  Laterality: Right;  axillary block in preop   LEFT HEART CATH AND CORONARY ANGIOGRAPHY N/A 11/03/2016   Procedure: Left Heart Cath and Coronary Angiography;  Surgeon: [name], MD;  Location: [place] [facility];  Service: Cardiovascular;  Laterality: N/A;   TONSILLECTOMY     TYMPANOPLASTY Right    Family History  Problem Relation Age of Onset   Cancer Brother        "in heart and lungs"     Social History   Tobacco Use   Smoking status: Former    Packs/day: 0.50    Years: 50.00    Pack years: 25.00    Types: Cigarettes    Quit date: 05/02/2021    Years since quitting: 0.0   Smokeless tobacco: Never   Tobacco comments:    currently smoking .5ppd as of 05/14/20  Substance Use Topics   Alcohol use: Yes    Alcohol/week: 6.0 standard drinks    Types: 6 Cans of  beer per week    Comment: social   Marital Status: Married  ROS  Review of Systems  Cardiovascular:  Positive for dyspnea on exertion (improved) and near-syncope. Negative for chest pain, claudication, leg swelling, orthopnea, palpitations, paroxysmal nocturnal dyspnea and syncope.  Respiratory:  Positive for cough (chronic, stable).   Neurological:  Negative for dizziness (resolved).  Objective  Blood pressure 120/60, pulse 73, temperature 97.8 F (36.6 C), temperature source Temporal, height 5\' 11"  (1.803 m), weight  156 lb (70.8 kg), SpO2 93 %.  Vitals with BMI 05/14/2021 05/06/2021 05/05/2021  Height 5\' 11"  - -  Weight 156 lbs - -  BMI 84.66 - -  Systolic 599 357 017  Diastolic 60 74 67  Pulse 73 64 65     Physical Exam Vitals reviewed.  Constitutional:      Appearance: He is well-developed.  Neck:     Thyroid: No thyromegaly.     Vascular: No JVD.  Cardiovascular:     Rate and Rhythm: Normal rate and regular rhythm.     Pulses: Intact distal pulses.          Carotid pulses are 2+ on the right side and 2+ on the left side.      Radial pulses are 2+ on the right side and 2+ on the left side.       Femoral pulses are 2+ on the right side and 2+ on the left side.      Popliteal pulses are 2+ on the right side and 2+ on the left side.       Dorsalis pedis pulses are 1+ on the right side and 1+ on the left side.       Posterior tibial pulses are 1+ on the right side and 1+ on the left side.     Heart sounds: Heart sounds are distant. No murmur heard.   No gallop.     Comments: No JVD.  Pulmonary:     Effort: Pulmonary effort is normal. No accessory muscle usage or respiratory distress.     Breath sounds: Rhonchi present. No wheezing.  Musculoskeletal:     Right lower leg: No edema.     Left lower leg: No edema.  Physical exam unchanged compared to previous office visit.   Laboratory examination:   Recent Labs    06/12/20 0947 06/20/20 0950 07/04/20 0920 07/23/20 1052 05/04/21 0816 05/05/21 0900 05/06/21 0607  NA 131* 131* 132*   < > 135 132* 135  K 5.5* 5.0 5.2   < > 4.0 4.3 3.8  CL 95* 94* 93*   < > 96* 91* 95*  CO2 22 21 27    < > 29 31 34*  GLUCOSE 102* 60* 80   < > 176* 211* 80  BUN 23 21 21    < > 30* 39* 33*  CREATININE 1.53* 1.49* 1.66*   < > 1.53* 1.61* 1.38*  CALCIUM 8.7 8.8 9.0   < > 8.5* 8.3* 7.9*  GFRNONAA 43* 44* 39*   < > 46* 43* 52*  GFRAA 50* 51* 45*  --   --   --   --    < > = values in this interval not displayed.   estimated creatinine clearance is 43.5  mL/min (A) (by C-G formula based on SCr of 1.38 mg/dL (H)).  CMP Latest Ref Rng & Units 05/06/2021 05/05/2021 05/04/2021  Glucose 70 - 99 mg/dL 80 211(H) 176(H)  BUN 8 - 23 mg/dL 33(H) 39(H) 30(H)  Creatinine 0.61 - 1.24 mg/dL 1.38(H) 1.61(H) 1.53(H)  Sodium 135 - 145 mmol/L 135 132(L) 135  Potassium 3.5 - 5.1 mmol/L 3.8 4.3 4.0  Chloride 98 - 111 mmol/L 95(L) 91(L) 96(L)  CO2 22 - 32 mmol/L 34(H) 31 29  Calcium 8.9 - 10.3 mg/dL 7.9(L) 8.3(L) 8.5(L)  Total Protein 6.5 - 8.1 g/dL 5.3(L) 6.4(L) 6.0(L)  Total Bilirubin 0.3 - 1.2 mg/dL 0.8 0.7 0.4  Alkaline Phos 38 - 126 U/L 64 76 64  AST 15 - 41 U/L 24 32 33  ALT 0 - 44 U/L 38 47(H) 42   CBC Latest Ref Rng & Units 05/06/2021 05/05/2021 05/04/2021  WBC 4.0 - 10.5 K/uL 14.8(H) 19.4(H) 24.0(H)  Hemoglobin 13.0 - 17.0 g/dL 13.5 14.3 13.5  Hematocrit 39.0 - 52.0 % 41.4 43.9 40.8  Platelets 150 - 400 K/uL 225 270 270   Lipid Panel     Component Value Date/Time   CHOL 139 06/12/2020 0947   TRIG 61 06/12/2020 0947   HDL 66 06/12/2020 0947   LDLCALC 60 06/12/2020 0947   External labs: None  Allergies  No Known Allergies   Medications Prior to Visit:   Outpatient Medications Prior to Visit  Medication Sig Dispense Refill   albuterol (ACCUNEB) 1.25 MG/3ML nebulizer solution Take 1 ampule by nebulization every 4 (four) hours as needed for wheezing or shortness of breath.     albuterol (VENTOLIN HFA) 108 (90 Base) MCG/ACT inhaler INHALE 2 PUFF BY MOUTH EVERY 6 HOURS AS NEEDED FOR WHEEZE OR SHORTNESS OF BREATH (Patient taking differently: Inhale 2 puffs into the lungs every 6 (six) hours as needed for shortness of breath or wheezing.) 18 each 5   amiodarone (PACERONE) 100 MG tablet Take 1 tablet (100 mg total) by mouth daily. 90 tablet 3   dapagliflozin propanediol (FARXIGA) 10 MG TABS tablet Take 1 tablet (10 mg total) by mouth daily before breakfast. 30 tablet 3   diphenhydrAMINE (BENADRYL) 25 MG tablet Take 25 mg by mouth every 6 (six)  hours as needed.     ENTRESTO 97-103 MG TAKE 1 TABLET BY MOUTH EVERY DAY (Patient taking differently: Take 1 tablet by mouth daily.) 60 tablet 6   isosorbide mononitrate (IMDUR) 30 MG 24 hr tablet Take 1 tablet (30 mg total) by mouth daily. 30 tablet 0   metoprolol succinate (TOPROL-XL) 25 MG 24 hr tablet Take 1 tablet (25 mg total) by mouth daily. 90 tablet 3   rosuvastatin (CRESTOR) 5 MG tablet TAKE 1 TABLET BY MOUTH EVERY DAY (Patient taking differently: Take by mouth daily.) 90 tablet 3   spironolactone (ALDACTONE) 25 MG tablet Take 1 tablet (25 mg total) by mouth daily. 30 tablet 0   TRELEGY ELLIPTA 100-62.5-25 MCG/INH AEPB TAKE 1 PUFF BY MOUTH EVERY DAY (Patient taking differently: Take 1 puff by mouth daily.) 60 each 3   triamcinolone cream (KENALOG) 0.1 % Apply 1 application topically 2 (two) times daily.     XARELTO 20 MG TABS tablet TAKE 1 TABLET BY MOUTH EVERY DAY WITH SUPPER (Patient taking differently: Take 20 mg by mouth daily with supper.) 90 tablet 3   furosemide (LASIX) 20 MG tablet Take 1 tablet (20 mg total) by mouth daily. (Patient not taking: Reported on 05/14/2021) 30 tablet 0   Facility-Administered Medications Prior to Visit  Medication Dose Route Frequency Provider Last Rate Last Admin   ipratropium-albuterol (DUONEB) 0.5-2.5 (3) MG/3ML nebulizer solution 3 mL  3 mL Nebulization Once Martyn Ehrich, NP  Final Medications at End of Visit    Current Meds  Medication Sig   albuterol (ACCUNEB) 1.25 MG/3ML nebulizer solution Take 1 ampule by nebulization every 4 (four) hours as needed for wheezing or shortness of breath.   albuterol (VENTOLIN HFA) 108 (90 Base) MCG/ACT inhaler INHALE 2 PUFF BY MOUTH EVERY 6 HOURS AS NEEDED FOR WHEEZE OR SHORTNESS OF BREATH (Patient taking differently: Inhale 2 puffs into the lungs every 6 (six) hours as needed for shortness of breath or wheezing.)   amiodarone (PACERONE) 100 MG tablet Take 1 tablet (100 mg total) by mouth daily.    dapagliflozin propanediol (FARXIGA) 10 MG TABS tablet Take 1 tablet (10 mg total) by mouth daily before breakfast.   diphenhydrAMINE (BENADRYL) 25 MG tablet Take 25 mg by mouth every 6 (six) hours as needed.   ENTRESTO 97-103 MG TAKE 1 TABLET BY MOUTH EVERY DAY (Patient taking differently: Take 1 tablet by mouth daily.)   isosorbide mononitrate (IMDUR) 30 MG 24 hr tablet Take 1 tablet (30 mg total) by mouth daily.   metoprolol succinate (TOPROL-XL) 25 MG 24 hr tablet Take 1 tablet (25 mg total) by mouth daily.   rosuvastatin (CRESTOR) 5 MG tablet TAKE 1 TABLET BY MOUTH EVERY DAY (Patient taking differently: Take by mouth daily.)   spironolactone (ALDACTONE) 25 MG tablet Take 1 tablet (25 mg total) by mouth daily.   TRELEGY ELLIPTA 100-62.5-25 MCG/INH AEPB TAKE 1 PUFF BY MOUTH EVERY DAY (Patient taking differently: Take 1 puff by mouth daily.)   triamcinolone cream (KENALOG) 0.1 % Apply 1 application topically 2 (two) times daily.   XARELTO 20 MG TABS tablet TAKE 1 TABLET BY MOUTH EVERY DAY WITH SUPPER (Patient taking differently: Take 20 mg by mouth daily with supper.)   Current Facility-Administered Medications for the 05/14/21 encounter (Office Visit) with Mario Berthold, PA-C  Medication   ipratropium-albuterol (DUONEB) 0.5-2.5 (3) MG/3ML nebulizer solution 3 mL   Radiology:   Low-dose CT chest 08/16/2019: Coronary and aortic atherosclerosis and calcification.  Severe centrilobular emphysema.  2 benign looking nodules.  Benign right upper kidney mass.  Chest x-ray 08/29/2020: Cardiac shadow is within normal limits. Aortic calcifications are seen. The lungs are well aerated bilaterally. No bony abnormality is seen. IMPRESSION: No active disease.   CT head without contrast 08/29/2020: 1. Advanced chronic ischemic microangiopathy without acute intracranial abnormality. 2. No acute fracture or static subluxation of the cervical spine. 3. Small right frontal scalp hematoma.  Cardiac Studies:    Lexiscan myoview stress test 09/18/2016: 1. The resting electrocardiogram demonstrated normal sinus rhythm, normal resting conduction, no resting arrhythmias and normal rest repolarization.  Stress EKG is non-diagnostic for ischemia as it a pharmacologic stress using Lexiscan. Stress symptoms included dyspnea. Occasional PVC noted. 2. The LV is dilated both at rest and stress images. The LV end diastolic volume was 867EH. SPECT images demonstrate Medium perfusion abnormality of moderate intensity in the basal inferior, mid inferior and apical inferior myocardial wall(s) on the stress images.  The defect remains relatively unchanged between rest and stress images and is a soft tissue attenuation artifact, however scar in this region without ischemia cannot be completely excluded. The left ventricular ejection fraction was calculated or visually estimated to be 25% with global hypokinesis. High risk study.  Abdominal aortic duplex 10/06/2016: Diffuse plaque noted in the proximal, mid and distal aorta.  No AAA observed.  Coronary angiogram 11/03/2016: Severe left ventricular systolic dysfunction. The left ventricular ejection fraction is 30-35% by visual  estimate with inferior wall akinesis.There is no mitral valve regurgitation. Normal LVEDP. Normal coronary arteries.  Holter Monitor 48 hours 09/07/2017: Minimum heart rate 38 bpm at 12:13 AM maximum heart rate 154 bpm at 12:59 PM. PVCs consisted of 3600 beats, 1.5% burden. Occasional ventricular and triplets, 194 couplets.. Bigeminy and trigeminy. 2 episodes of 4 beat 3 beat NSVT. Predominant rhythm was atrial fibrillation.   Direct current cardioversion 07/30/20 : Indication symptomatic A. Fibrillation. Procedure: Using 80 mg of IV Propofol and 60 IV Lidocaine (for reducing venous pain) for achieving deep sedation, synchronized direct current cardioversion performed. Patient was delivered with 150 Joules of electricity X 1 with success to NSR. Patient  tolerated the procedure well. No immediate complication noted.   Echocardiogram 05/04/2021:  1. Left ventricular ejection fraction, by estimation, is 45 to 50%. The left ventricle has mildly decreased function. The left ventricle demonstrates global hypokinesis. There is mild left ventricular  hypertrophy. Left ventricular diastolic parameters were normal.   2. Right ventricular systolic function is normal. The right ventricular size is normal.   3. The mitral valve is normal in structure. Trivial mitral valve regurgitation. No evidence of mitral stenosis.   4. The aortic valve is normal in structure. Aortic valve regurgitation is not visualized. No aortic stenosis is present.   5. Compared to previous outpatient study in 10/2020, LVEF is marginally improved from 35-40%.  EKG:  02/12/2021: Sinus bradycardia with first-degree AV block at a rate of 58 bpm.  Left axis, left anterior fascicular block.  Poor R wave progression, cannot exclude anteroseptal infarct old.  Compared to EKG 08/13/2020, no significant change.  EKG 08/13/2020: Sinus bradycardia at a rate of 58 bpm.  Left axis, left anterior fascicular block.  Poor R wave progression, cannot exclude anteroseptal infarct old.  LVH.  Compared to EKG 05/21/2020, now in sinus rhythm.  EKG 05/21/2020: Atrial fibrillation with controlled ventricular response at a rate of 58 bpm.  Left axis, left anterior fascicular block.  LVH. Poor R wave progression, cannot exclude anteroseptal infarct old.Compared to EKG 03/07/2020, no ectopic ventricular beats.  EKG 03/07/2020: Atrial fibrillation with controlled ventricular response at a rate of 61 beats per with ectopic ventricular beats (2).  Left axis deviation, left anterior fascicular block.  Left ventricular hypertrophy. Poor R wave progression, cannot exclude anterior septal infarct old.  Compared to EKG 09/07/2019, no significant change  Assessment     ICD-10-CM   1. Chronic combined systolic and diastolic CHF  (congestive heart failure) (HCC)  I50.42 EKG 12-Lead    CBC    COMPLETE METABOLIC PANEL WITH GFR    Pro b natriuretic peptide (BNP)[facility])    DG Chest 2 View    2. Paroxysmal atrial fibrillation (HCC)  I48.0 EKG 12-Lead    3. Nicotine dependence, cigarettes, uncomplicated  O24.235       No orders of the defined types were placed in this encounter.   Medications Discontinued During This Encounter  Medication Reason   furosemide (LASIX) 20 MG tablet Side effect (s)  This patients CHA2DS2-VASc Score 3 (CHF, A) and yearly risk of stroke 3.2%.    Recommendations:   KELVEN FLATER  is a 81 y.o.  Caucasian male patient  with COPD,  current everyday smoker (57-pack-year history), nonischemic cardiomyopathy by coronary angiogram on 11/02/2016 revealing ejection fraction of 30-35%, ongoning tobacco use disorder, and permanent A fib. He is asymptomatic in regards to his A fib and is now on anticoagulation.  Echocardiogram  03/2020 revealed significantly decreased LVEF at 25%.  Patient was therefore started on amiodarone and underwent successful cardioversion 07/30/2020.  Patient was admitted 05/03/2021 - 05/06/2021 with COVID-19 pneumonia and acute on chronic systolic heart failure.  Patient was treated with steroids and remdesivir and diuresed with IV Lasix, dyspnea significantly improved and patient was sent home with supplemental oxygen.  Notably echocardiogram during hospitalization showed mild improvement of LVEF from 35-40% to 40 5-50% following discharged on 05/09/2021 patient had an episode of near syncope. He now presents for follow up accompanied by his wife.   Patient's dyspnea has significantly improved since discharge from the hospital, however he does continue to require supplemental oxygen at 2 L/min with exertion.  This is likely multifactorial including underlying COPD, recovering from pneumonia, and heart failure.  Again counseled patient regarding tobacco  cessation, however he does not appear motivated to quit.  Patient plans to follow-up with pulmonology in the next 2 months.  Given hypotension when taking Lasix advised patient to discontinue daily Lasix intake it only as needed.  Also advised patient that if he takes Lasix to hold Entresto that day.  Patient and his wife both verbalized understanding agreement.  He is tolerating Xarelto without bleeding diathesis, will continue anticoagulation.  We will obtain repeat labs given recent hospitalization.  We will obtain CBC, CMP, proBNP, as well as chest x-ray.  We will continue guideline directed medical therapy including Entresto, Farxiga, and metoprolol as well.  Patient is also tolerating BiDil and amiodarone.  Recommend continued daily weights and low-sodium diet.  As patient is on amiodarone, he will need annual monitoring of PFTs, liver function, and thyroid function.  Follow-up in 3 months, sooner if needed, for atrial fibrillation, nonischemic cardiomyopathy, HFrEF.   Mario Berthold, PA-C 05/14/2021, 1:00 PM Office: [phone]

## 2021-05-14 ENCOUNTER — Encounter: Payer: Self-pay | Admitting: Student

## 2021-05-14 ENCOUNTER — Other Ambulatory Visit: Payer: Self-pay | Admitting: Student

## 2021-05-14 ENCOUNTER — Ambulatory Visit: Payer: Medicare HMO | Admitting: Student

## 2021-05-14 ENCOUNTER — Other Ambulatory Visit: Payer: Self-pay

## 2021-05-14 VITALS — BP 120/60 | HR 73 | Temp 97.8°F | Ht 71.0 in | Wt 156.0 lb

## 2021-05-14 DIAGNOSIS — I5042 Chronic combined systolic (congestive) and diastolic (congestive) heart failure: Secondary | ICD-10-CM | POA: Diagnosis not present

## 2021-05-14 DIAGNOSIS — R69 Illness, unspecified: Secondary | ICD-10-CM | POA: Diagnosis not present

## 2021-05-14 DIAGNOSIS — I480 Paroxysmal atrial fibrillation: Secondary | ICD-10-CM

## 2021-05-14 DIAGNOSIS — F17210 Nicotine dependence, cigarettes, uncomplicated: Secondary | ICD-10-CM

## 2021-05-15 LAB — COMPREHENSIVE METABOLIC PANEL
ALT: 33 IU/L (ref 0–44)
AST: 23 IU/L (ref 0–40)
Albumin/Globulin Ratio: 1.1 — ABNORMAL LOW (ref 1.2–2.2)
Albumin: 3.1 g/dL — ABNORMAL LOW (ref 3.7–4.7)
Alkaline Phosphatase: 99 IU/L (ref 44–121)
BUN/Creatinine Ratio: 12 (ref 10–24)
BUN: 16 mg/dL (ref 8–27)
Bilirubin Total: 0.6 mg/dL (ref 0.0–1.2)
CO2: 26 mmol/L (ref 20–29)
Calcium: 8 mg/dL — ABNORMAL LOW (ref 8.6–10.2)
Chloride: 99 mmol/L (ref 96–106)
Creatinine, Ser: 1.3 mg/dL — ABNORMAL HIGH (ref 0.76–1.27)
Globulin, Total: 2.8 g/dL (ref 1.5–4.5)
Glucose: 88 mg/dL (ref 70–99)
Potassium: 4.4 mmol/L (ref 3.5–5.2)
Sodium: 136 mmol/L (ref 134–144)
Total Protein: 5.9 g/dL — ABNORMAL LOW (ref 6.0–8.5)
eGFR: 56 mL/min/{1.73_m2} — ABNORMAL LOW (ref >59)

## 2021-05-15 LAB — CBC
Hematocrit: 38.3 % (ref 37.5–51.0)
Hemoglobin: 12.7 g/dL — ABNORMAL LOW (ref 13.0–17.7)
MCH: 30.3 pg (ref 26.6–33.0)
MCHC: 33.2 g/dL (ref 31.5–35.7)
MCV: 91 fL (ref 79–97)
Platelets: 223 10*3/uL (ref 150–450)
RBC: 4.19 x10E6/uL (ref 4.14–5.80)
RDW: 11.5 % — ABNORMAL LOW (ref 11.6–15.4)
WBC: 8.7 10*3/uL (ref 3.4–10.8)

## 2021-05-15 LAB — PRO B NATRIURETIC PEPTIDE: NT-Pro BNP: 2439 pg/mL — ABNORMAL HIGH (ref 0–486)

## 2021-05-16 NOTE — Progress Notes (Signed)
Called and spoke to pt, pt voiced understanding.

## 2021-05-19 ENCOUNTER — Ambulatory Visit
Admission: RE | Admit: 2021-05-19 | Discharge: 2021-05-19 | Disposition: A | Discharge status: Home / Self Care | Payer: Medicare HMO | Source: Ambulatory Visit | Attending: Student | Admitting: Student

## 2021-05-19 ENCOUNTER — Other Ambulatory Visit: Payer: Self-pay

## 2021-05-19 DIAGNOSIS — I5042 Chronic combined systolic (congestive) and diastolic (congestive) heart failure: Secondary | ICD-10-CM

## 2021-05-19 DIAGNOSIS — R0600 Dyspnea, unspecified: Secondary | ICD-10-CM | POA: Diagnosis not present

## 2021-05-21 NOTE — Progress Notes (Signed)
Called patient, NA, LMAM

## 2021-05-21 NOTE — Progress Notes (Signed)
Chest x-ray improved. Please ask patient if there is PCP or pulmonology provider we shall send results to?

## 2021-05-21 NOTE — Progress Notes (Signed)
Spoke to patient he is aware of results and results have been fax to his pulmonology provider

## 2021-05-26 ENCOUNTER — Ambulatory Visit (INDEPENDENT_AMBULATORY_CARE_PROVIDER_SITE_OTHER): Payer: Medicare HMO

## 2021-05-26 ENCOUNTER — Ambulatory Visit: Payer: Medicare HMO | Admitting: Nurse Practitioner

## 2021-05-26 ENCOUNTER — Encounter: Payer: Self-pay | Admitting: Nurse Practitioner

## 2021-05-26 ENCOUNTER — Other Ambulatory Visit: Payer: Self-pay

## 2021-05-26 VITALS — BP 118/76 | HR 68 | Ht 71.0 in | Wt 155.0 lb

## 2021-05-26 DIAGNOSIS — R0602 Shortness of breath: Secondary | ICD-10-CM | POA: Diagnosis not present

## 2021-05-26 DIAGNOSIS — I5042 Chronic combined systolic (congestive) and diastolic (congestive) heart failure: Secondary | ICD-10-CM

## 2021-05-26 DIAGNOSIS — J9601 Acute respiratory failure with hypoxia: Secondary | ICD-10-CM

## 2021-05-26 DIAGNOSIS — J189 Pneumonia, unspecified organism: Secondary | ICD-10-CM | POA: Diagnosis not present

## 2021-05-26 DIAGNOSIS — R0609 Other forms of dyspnea: Secondary | ICD-10-CM | POA: Diagnosis not present

## 2021-05-26 DIAGNOSIS — Z09 Encounter for follow-up examination after completed treatment for conditions other than malignant neoplasm: Secondary | ICD-10-CM | POA: Diagnosis not present

## 2021-05-26 DIAGNOSIS — J441 Chronic obstructive pulmonary disease with (acute) exacerbation: Secondary | ICD-10-CM

## 2021-05-26 LAB — CBC WITH DIFFERENTIAL/PLATELET
Basophils Absolute: 0 10*3/uL (ref 0.0–0.1)
Basophils Relative: 0.5 % (ref 0.0–3.0)
Eosinophils Absolute: 0.1 10*3/uL (ref 0.0–0.7)
Eosinophils Relative: 2.1 % (ref 0.0–5.0)
HCT: 40.4 % (ref 39.0–52.0)
Hemoglobin: 13.3 g/dL (ref 13.0–17.0)
Lymphocytes Relative: 24.5 % (ref 12.0–46.0)
Lymphs Abs: 1.2 10*3/uL (ref 0.7–4.0)
MCHC: 32.9 g/dL (ref 30.0–36.0)
MCV: 91.4 fl (ref 78.0–100.0)
Monocytes Absolute: 0.7 10*3/uL (ref 0.1–1.0)
Monocytes Relative: 15 % — ABNORMAL HIGH (ref 3.0–12.0)
Neutro Abs: 2.9 10*3/uL (ref 1.4–7.7)
Neutrophils Relative %: 57.9 % (ref 43.0–77.0)
Platelets: 416 10*3/uL — ABNORMAL HIGH (ref 150.0–400.0)
RBC: 4.42 Mil/uL (ref 4.22–5.81)
RDW: 13.6 % (ref 11.5–15.5)
WBC: 5 10*3/uL (ref 4.0–10.5)

## 2021-05-26 LAB — BASIC METABOLIC PANEL
BUN: 16 mg/dL (ref 6–23)
CO2: 29 mEq/L (ref 19–32)
Calcium: 8.2 mg/dL — ABNORMAL LOW (ref 8.4–10.5)
Chloride: 96 mEq/L (ref 96–112)
Creatinine, Ser: 1.35 mg/dL (ref 0.40–1.50)
GFR: 49.95 mL/min — ABNORMAL LOW (ref >60.00)
Glucose, Bld: 99 mg/dL (ref 70–99)
Potassium: 4.8 mEq/L (ref 3.5–5.1)
Sodium: 131 mEq/L — ABNORMAL LOW (ref 135–145)

## 2021-05-26 LAB — BRAIN NATRIURETIC PEPTIDE: Pro B Natriuretic peptide (BNP): 213 pg/mL — ABNORMAL HIGH (ref 0.0–100.0)

## 2021-05-26 MED ORDER — BREZTRI AEROSPHERE 160-9-4.8 MCG/ACT IN AERO: 2.0000 | INHALATION_SPRAY | Freq: Two times a day (BID) | RESPIRATORY_TRACT | 0 refills | Status: DC

## 2021-05-26 MED ORDER — PREDNISONE 10 MG PO TABS: ORAL_TABLET | ORAL | 0 refills | Status: DC

## 2021-05-26 MED ORDER — SPACER/AERO-HOLDING CHAMBERS DEVI: 2 refills | Status: DC

## 2021-05-26 NOTE — Assessment & Plan Note (Addendum)
Improved symptoms post discharge until after stopping daily lasix; now with worsening DOE. CXR today with persistent mass-like consolidation in RML and RLL. Will need follow up CT 8 weeks from initial -around mid February 2023. CBC with diff today without leukocytosis.

## 2021-05-26 NOTE — Progress Notes (Signed)
@Patient  ID: Mario Proctor, male    DOB: 10-18-1940, 81 y.o.   MRN: [id number]  No chief complaint on file.   Referring provider: Tamsen Roers, MD  HPI: 81 year old male, former smoker (42 pack year history) followed for COPD Gold A and DOE. He is a patient of Dr. Matilde Bash and last seen on 05/02/2021 by Volanda Napoleon, NP. Past medical history significant for a fib, cardiomyopathy, chronic combined systolic and diastolic HF. He is also followed by the lung cancer screening program.  TEST/EVENTS:  01/07/2017 PFTs: FVC 3.96 (89%), FEV1 1.86 (58), ratio 47, TLC 113%, DLCO uncorrected 17.71 (52%).  Moderate obstructive airways disease 10/14/2020 CT chest without contrast: Atherosclerosis.  Scattered small mediastinal lymph nodes evident.  Centrilobular and paraseptal emphysema.  Previously identified nodular area in RLL resolved; some very subtle groundglass attenuation.  No new suspicious pulmonary nodule or mass.  Tiny nodule left apex unchanged.  Stable 2 cm exophytic lesion upper pole right kidney.  Recommended return for lung cancer screening. 03/15/2020 echocardiogram: EF 20 to 25%.  LV moderately dilated.  Dilated cardiomyopathy.  Mild LVH.  G1 DD.  Mild to moderate mitral regurgitation.  Mild to moderate tricuspid regurgitation.  Moderate pulmonary hypertension.  IVC dilated with poor inspiration collapse consistent with elevated right atrial pressure. 11/06/2020 echocardiogram: LVEF 30 to 35%.  Severe global hypokinesis.  G1 DD.  LA severely dilated.  Mild mitral regurgitation.  Mild tricuspid regurgitation.  Improvement in PASP from 44 to 20 mmHg.  05/03/2019 CT chest without contrast: Atherosclerosis.  Small mediastinal lymph nodes, including a 9 mm short axis AP window node, likely reactive.  2.7 x 4.2 cm irregular/spiculated masslike opacity in the posterior RLL.  Mild associated hyperdensity within the lesion.  Adjacent debris's within RLL bronchi.  1.4 x 2.6 cm irregular nodular opacity in the  posterior RUL and 7x12 mm irregular nodular opacity in the posterior LUL. Additional mild irregular linear/nodular opacity in RUL. Moderate centrilobular and paraseptal emphysematous changes. Mild superior endplate compression fx at L1. 05/04/2021 echocardiogram: LVEF 45 to 50%.  Mild LVH.  Diastolic parameters normal.  Trivial MVR.  No evidence of pulmonary hypertension  07/21/19: OV. Changed from Bevespi to Trelegy d/t elevated peripheral eos. Only using Trelegy intermittently d/t cost. Continues to have DOE and chronic cough.  05/14/2020: Acute visit - SOB and fatigue. COVID negative. Tx for exacerbation. Continued on Trelegy daily and PRN albuterol. Previous LDCT for lung cancer screening noted lesion on right kidney - CT abd for further eval consistent with benign proteinaceous or hemorrhagic cyst (no further follow up indicated).  04/22/2021: Acute visit - SOB. Desaturations upon ambulation and opacities on CXR concerning for pneumonia -advised to be further evaluated in ED.  05/02/2021-05/06/2021: Hospitalization for acute respiratory failure r/t acute COVID viral pneumonitis vs CAP/CHF. Suspected multifocal opacities r/t CHF; low suspicion for COVID pneumonia. Tx with steroids and Remdesivir. Improved after IV lasix. Discharged on low dose daily lasix and aldactone. Discharged on 2 lpm supplemental O2.  05/26/2021: Today - hospital follow up Patient presents today with wife for hospital follow up. He was treated for acute hypoxic respiratory failure which was suspected to be related to CHF exacerbation; although, he did have COVID so there was low suspicion for COVID pneumonia. He was discharged on daily lasix and aldactone. He had a near syncopal episode on 12/30 - EMS called and pt stable upon arrival. He subsequently stopped taking his lasix a few days later and saw cardiology on 1/9. He  was instructed to discontinue lasix and only take on an as needed basis and to hold [place] when he takes it.  Today, he reports that he noticed worsening dyspnea upon exertion which started last Thursday. He has a mild cough that is non-productive and not significantly changed. His oxygen has been stable as long as he wears his supplemental O2; however, when he removes it and exerts himself, he will drop to the low 80's. He is monitoring his weights but reports that they have never really changed, despite his worsening heart function. He also denies lower extremity swelling, but reports this has never occurred either. He denies fevers, chills, or recent sick exposures. He denies orthopnea, PND, chest pain, wheezing or hemoptysis. He continues on Trelegy daily. He has not been using his nebs for rescue. He has not taken his lasix since before he saw cardiology. He has quit smoking. Overall, he feels ok but reports his breathing as worse.   No Known Allergies  Immunization History  Administered Date(s) Administered   Fluad Quad(high Dose 65+) 02/23/2019   Influenza, High Dose Seasonal PF 02/15/2016, 04/16/2017, 04/04/2018, 03/11/2020   PFIZER(Purple Top)SARS-COV-2 Vaccination 06/15/2019, 07/10/2019, 04/02/2020   Pneumococcal-Unspecified 10/16/2010   Tdap 08/29/2020    Past Medical History:  Diagnosis Date   A-fib [facility])    Allergy    CHF (congestive heart failure) ([place])    Dupuytren contracture    right sm finger   Myocardial infarct ([place]) 2019   Myocardial infarction ([place])    Small bowel obstruction (HCC)     Tobacco History: Social History   Tobacco Use  Smoking Status Former   Packs/day: 0.50   Years: 50.00   Pack years: 25.00   Types: Cigarettes   Quit date: 05/02/2021   Years since quitting: 0.0  Smokeless Tobacco Never  Tobacco Comments   currently smoking .5ppd as of 05/14/20   Counseling given: Not Answered Tobacco comments: currently smoking .5ppd as of 05/14/20   Outpatient Medications Prior to Visit  Medication Sig Dispense Refill   albuterol (ACCUNEB) 1.25 MG/3ML nebulizer  solution Take 1 ampule by nebulization every 4 (four) hours as needed for wheezing or shortness of breath.     albuterol (VENTOLIN HFA) 108 (90 Base) MCG/ACT inhaler INHALE 2 PUFF BY MOUTH EVERY 6 HOURS AS NEEDED FOR WHEEZE OR SHORTNESS OF BREATH (Patient taking differently: Inhale 2 puffs into the lungs every 6 (six) hours as needed for shortness of breath or wheezing.) 18 each 5   amiodarone (PACERONE) 100 MG tablet Take 1 tablet (100 mg total) by mouth daily. 90 tablet 3   dapagliflozin propanediol (FARXIGA) 10 MG TABS tablet Take 1 tablet (10 mg total) by mouth daily before breakfast. 30 tablet 3   diphenhydrAMINE (BENADRYL) 25 MG tablet Take 25 mg by mouth every 6 (six) hours as needed.     ENTRESTO 97-103 MG TAKE 1 TABLET BY MOUTH EVERY DAY (Patient taking differently: Take 1 tablet by mouth daily.) 60 tablet 6   isosorbide mononitrate (IMDUR) 30 MG 24 hr tablet Take 1 tablet (30 mg total) by mouth daily. 30 tablet 0   metoprolol succinate (TOPROL-XL) 25 MG 24 hr tablet Take 1 tablet (25 mg total) by mouth daily. 90 tablet 3   rosuvastatin (CRESTOR) 5 MG tablet TAKE 1 TABLET BY MOUTH EVERY DAY (Patient taking differently: Take by mouth daily.) 90 tablet 3   spironolactone (ALDACTONE) 25 MG tablet Take 1 tablet (25 mg total) by mouth daily. 30 tablet 0  triamcinolone cream (KENALOG) 0.1 % Apply 1 application topically 2 (two) times daily.     XARELTO 20 MG TABS tablet TAKE 1 TABLET BY MOUTH EVERY DAY WITH SUPPER (Patient taking differently: Take 20 mg by mouth daily with supper.) 90 tablet 3   TRELEGY ELLIPTA 100-62.5-25 MCG/INH AEPB TAKE 1 PUFF BY MOUTH EVERY DAY (Patient taking differently: Take 1 puff by mouth daily.) 60 each 3   Facility-Administered Medications Prior to Visit  Medication Dose Route Frequency Provider Last Rate Last Admin   ipratropium-albuterol (DUONEB) 0.5-2.5 (3) MG/3ML nebulizer solution 3 mL  3 mL Nebulization Once Martyn Ehrich, NP         Review of Systems:    Constitutional: No weight loss or gain, night sweats, fevers, chills, fatigue, or lassitude. Weight stable from cardiology visit on 1/9 HEENT: No headaches, difficulty swallowing, tooth/dental problems, or sore throat. No sneezing, itching, ear ache, nasal congestion, or post nasal drip CV:  No chest pain, orthopnea, PND, swelling in lower extremities, anasarca, dizziness, palpitations, syncope Resp: +shortness of breath with exertion (worse); occasional dry cough. No excess mucus or change in color of mucus. No hemoptysis. No wheezing.  No chest wall deformity GI:  No heartburn, indigestion, abdominal pain, nausea, vomiting, diarrhea, change in bowel habits, loss of appetite, bloody stools.  GU: No dysuria, change in color of urine, urgency or frequency.  No flank pain, no hematuria  Skin: No rash, lesions, ulcerations MSK:  No joint pain or swelling.  No decreased range of motion.  No back pain. Neuro: No dizziness or lightheadedness.  Psych: No depression or anxiety. Mood stable.     Physical Exam:  BP 118/76    Pulse 68    Ht 5\' 11"  (1.803 m)    Wt 155 lb (70.3 kg)    SpO2 97%    BMI 21.62 kg/m   GEN: Pleasant, interactive, chronically-ill appearing; in no acute distress. HEENT:  Normocephalic and atraumatic. EACs patent bilaterally. TM pearly gray with present light reflex bilaterally. PERRLA. Sclera white. Nasal turbinates pink, moist and patent bilaterally. No rhinorrhea present. Oropharynx pink and moist, without exudate or edema. No lesions, ulcerations, or postnasal drip.  NECK:  Supple w/ fair ROM. No JVD present. Normal carotid impulses w/o bruits. Thyroid symmetrical with no goiter or nodules palpated. No lymphadenopathy.   CV: RRR, no m/r/g, no peripheral edema. Pulses intact, +2 bilaterally. No cyanosis, pallor or clubbing. PULMONARY:  Unlabored, regular breathing. Scattered wheeze bilaterally P; decreased airflow b/l. No accessory muscle use. No dullness to percussion. On 2  lpm [place] GI: BS present and normoactive. Soft, non-tender to palpation. No organomegaly or masses detected. No CVA tenderness. MSK: No erythema, warmth or tenderness. Cap refil <2 sec all extrem. No deformities or joint swelling noted.  Neuro: A/Ox3. No focal deficits noted.   Skin: Warm, no lesions or rashe Psych: Normal affect and behavior. Judgement and thought content appropriate.     Lab Results:  CBC    Component Value Date/Time   WBC 5.0 05/26/2021 1036   RBC 4.42 05/26/2021 1036   HGB 13.3 05/26/2021 1036   HGB 12.7 (L) 05/14/2021 1019   HCT 40.4 05/26/2021 1036   HCT 38.3 05/14/2021 1019   PLT 416.0 (H) 05/26/2021 1036   PLT 223 05/14/2021 1019   MCV 91.4 05/26/2021 1036   MCV 91 05/14/2021 1019   MCH 30.3 05/14/2021 1019   MCH 31.4 05/06/2021 0607   MCHC 32.9 05/26/2021 1036   RDW 13.6  05/26/2021 1036   RDW 11.5 (L) 05/14/2021 1019   LYMPHSABS 1.2 05/26/2021 1036   LYMPHSABS 2.0 09/01/2018 0803   MONOABS 0.7 05/26/2021 1036   EOSABS 0.1 05/26/2021 1036   EOSABS 0.6 (H) 09/01/2018 0803   BASOSABS 0.0 05/26/2021 1036   BASOSABS 0.1 09/01/2018 0803    BMET    Component Value Date/Time   NA 136 05/14/2021 1019   K 4.4 05/14/2021 1019   CL 99 05/14/2021 1019   CO2 26 05/14/2021 1019   GLUCOSE 88 05/14/2021 1019   GLUCOSE 80 05/06/2021 0607   BUN 16 05/14/2021 1019   CREATININE 1.30 (H) 05/14/2021 1019   CALCIUM 8.0 (L) 05/14/2021 1019   GFRNONAA 52 (L) 05/06/2021 0607   GFRAA 45 (L) 07/04/2020 0920    BNP    Component Value Date/Time   BNP 292.4 (H) 05/06/2021 0607     Imaging:  DG Chest 2 View  Result Date: 05/26/2021 CLINICAL DATA:  Shortness of breath EXAM: CHEST - 2 VIEW COMPARISON:  Chest radiograph 05/19/2021. FINDINGS: Loop recorder. Stable cardiac and mediastinal contours. Persistent masslike area of consolidation within the right mid and lower lung. No pleural effusion or pneumothorax. Osseous structures unremarkable. IMPRESSION: Persistent  masslike consolidation within the right mid and lower lung. Given the persistent nature of these findings, and findings on prior chest CT, recommend additional follow-up chest CT to assess for interval improvement/changes. Electronically Signed   By: Lovey Newcomer M.D.   On: 05/26/2021 11:16   DG Chest 2 View  Result Date: 05/19/2021 CLINICAL DATA:  81 year old male with dyspnea EXAM: CHEST - 2 VIEW COMPARISON:  05/05/2021, chest CT 05/02/2021, plain film 05/02/2021, 05/14/2020 FINDINGS: Cardiomediastinal silhouette unchanged in size and contour. No evidence of central vascular congestion. No interlobular septal thickening. Cardiac event recorder on the left chest wall. Stigmata of emphysema, with increased retrosternal airspace, flattened hemidiaphragms, increased AP diameter, and hyperinflation on the AP view. Improved appearance of the right infrahilar reticulonodular opacity compared to 05/02/2021, similar [id number]. Resolution of the prior reticulonodular opacity of the left lung compared to the chest x-ray of 05/02/2021. No new airspace disease. No pneumothorax or pleural effusion. No acute displaced fracture. Degenerative changes of the spine. IMPRESSION: Improved though not resolved reticulonodular opacity of the right infrahilar region which corresponds to the findings on prior CT. Background emphysema. Electronically Signed   By: Corrie Mckusick D.O.   On: 05/19/2021 14:37   DG Chest 2 View  Result Date: 05/02/2021 CLINICAL DATA:  Provided history: Shortness of breath for 1 week, new hypoxia EXAM: CHEST - 2 VIEW COMPARISON:  Chest CT 10/14/2020.  Chest radiographs 05/14/2020 FINDINGS: An ovoid electronic device projects over the left chest. This may reflect a loop recorder or leadless pacer device. Heart size within normal limits. Aortic atherosclerosis. Moderate-sized airspace opacity within the right lower lobe and right infrahilar region. Additionally, there is suggestion of a subtle nodular airspace  opacity within the left upper lobe. No evidence of pleural effusion or pneumothorax. No acute bony abnormality identified. IMPRESSION: Moderate-sized airspace opacity within the right lower lobe and right infrahilar region, compatible with pneumonia in the appropriate clinical setting. Followup PA and lateral chest radiographs are recommended in 3-4 weeks following trial of antibiotic therapy to ensure resolution and exclude underlying malignancy. Suggestion of a subtle nodular airspace opacity within the left upper lobe. This may also be infectious/inflammatory in etiology. However, if this finding persists at time of radiographic follow-up, a chest CT is recommended for further  evaluation. Aortic Atherosclerosis (ICD10-I70.0). Electronically Signed   By: Kellie Simmering D.O.   On: 05/02/2021 11:13   CT Chest Wo Contrast  Result Date: 05/02/2021 CLINICAL DATA:  Respiratory illness, abnormal chest radiograph, hypoxia EXAM: CT CHEST WITHOUT CONTRAST TECHNIQUE: Multidetector CT imaging of the chest was performed following the standard protocol without IV contrast. COMPARISON:  Chest radiographs dated 05/02/2021. CT chest dated 10/14/2020. FINDINGS: Cardiovascular: Heart is normal in size.  No pericardial effusion. No evidence of thoracic aortic aneurysm. Atherosclerotic calcifications of the arch. Mild coronary atherosclerosis of the LAD and right coronary artery. Mediastinum/Nodes: Small mediastinal lymph nodes, including a 9 mm short axis AP window node (series 3/image 32), likely reactive. Visualized thyroid is unremarkable. Lungs/Pleura: 2.7 x 4.2 cm irregular/spiculated masslike opacity in the posterior right lower lobe (series 4/image 101). Mild associated hyperdensity within the lesion (series 3/image 102). Adjacent debris within right lower lobe bronchi (series 4/image 101). Additional 1.4 x 2.6 cm irregular nodular opacity in the posterior right upper lobe (series 4/image 35) and 7 x 12 mm irregular nodular  opacity in the posterior left upper lobe (series 4/image 61). Additional mild irregular linear/nodular opacity in the right upper lobe (series 4/image 45). Despite the masslike appearance of the dominant lesion, the overall appearance, distribution, and ancillary findings favor multifocal infection/pneumonia, possibly on the basis of aspiration. However, this does warrant follow-up. Moderate centrilobular and paraseptal emphysematous changes, upper lung predominant. No pleural effusion or pneumothorax. Upper Abdomen: Visualized upper abdomen is notable for vascular calcifications and a 2.4 cm right upper pole renal cyst. Musculoskeletal: Mild superior endplate compression fracture deformity at L1, new from the prior. IMPRESSION: Multifocal patchy opacities including a 4.2 cm irregular/spiculated masslike opacity in the posterior right lower lobe. Overall appearance/distribution favors multifocal infection/pneumonia, possibly on the basis of aspiration. However, follow-up CT chest is suggested in 6-12 weeks (after appropriate antimicrobial therapy) to document improvement/resolution. Small mediastinal lymph nodes, likely reactive. Mild superior endplate compression fracture deformity at L1, new from the prior. Aortic Atherosclerosis (ICD10-I70.0) and Emphysema (ICD10-J43.9). Electronically Signed   By: Julian Hy M.D.   On: 05/02/2021 15:04   DG Chest Port 1 View  Result Date: 05/05/2021 CLINICAL DATA:  Shortness of breath, COVID EXAM: PORTABLE CHEST 1 VIEW COMPARISON:  05/03/2021 FINDINGS: Loop recorder remains in place. Heart is normal size. Previously seen right spiculated mass projecting over the right hilum not as well visualized on today's study. No new infiltrates or effusions. No acute bone abnormality. IMPRESSION: Previously seen spiculated right lung mass by CT and prior plain films not as well visualized on today's study. No new/acute infiltrates/consolidation. Electronically Signed   By: Rolm Baptise M.D.   On: 05/05/2021 13:33   DG Chest Port 1 View  Result Date: 05/03/2021 CLINICAL DATA:  81 year old male with shortness of breath. Abnormal chest CT. EXAM: PORTABLE CHEST 1 VIEW COMPARISON:  Chest CT 0230 hours today and earlier. FINDINGS: Portable AP upright view at 0902 hours. Spiculated density projecting over the right inferior hilum corresponding to the dominant abnormality on CT. Otherwise normal cardiac size and mediastinal contours. Cardiac loop recorder or lead less ICD at the left chest. Visualized tracheal air column is within normal limits. Stable ventilation. No pneumothorax or pleural effusion. No acute osseous abnormality identified. IMPRESSION: 1. Unchanged from the CT at 0230 hours today with dominant spiculated opacity projecting over the right hilum. 2. No new cardiopulmonary abnormality. Electronically Signed   By: Genevie Ann M.D.   On: 05/03/2021 09:19  ECHOCARDIOGRAM LIMITED  Result Date: 05/04/2021    ECHOCARDIOGRAM LIMITED REPORT   Patient Name:   WAEL MAESTAS Date of Exam: 05/04/2021 Medical Rec #:  [id number]       Height:       71.0 in Accession #:    [id number]      Weight:       159.8 lb Date of Birth:  Jan 30, 1941       BSA:          1.917 m Patient Age:    88 years        BP:           139/85 mmHg Patient Gender: M               HR:           63 bpm. Exam Location:  Inpatient Procedure: Limited Echo, Cardiac Doppler and Color Doppler Indications:    CHF-Acute Systolic  History:        Patient has prior history of Echocardiogram examinations, most                 recent 11/06/2020. CHF, COPD, Arrythmias:Atrial Fibrillation;                 Risk Factors:Dyslipidemia. COVID-19. CKD.  Sonographer:    Clayton Lefort RDCS (AE) Referring Phys: [id number] Mackintosh [facility] [place]  Sonographer Comments: COVID-19 IMPRESSIONS  1. Left ventricular ejection fraction, by estimation, is 45 to 50%. The left ventricle has mildly decreased function. The left ventricle demonstrates global hypokinesis.  There is mild left ventricular hypertrophy. Left ventricular diastolic parameters were normal.  2. Right ventricular systolic function is normal. The right ventricular size is normal.  3. The mitral valve is normal in structure. Trivial mitral valve regurgitation. No evidence of mitral stenosis.  4. The aortic valve is normal in structure. Aortic valve regurgitation is not visualized. No aortic stenosis is present.  5. Compared to previous outpatient study in 10/2020, LVEF is marginally improved from 35-40%. FINDINGS  Left Ventricle: Left ventricular ejection fraction, by estimation, is 45 to 50%. The left ventricle has mildly decreased function. The left ventricle demonstrates global hypokinesis. There is mild left ventricular hypertrophy. Left ventricular diastolic  parameters were normal. Right Ventricle: The right ventricular size is normal. No increase in right ventricular wall thickness. Right ventricular systolic function is normal. Left Atrium: Left atrial size was normal in size. Right Atrium: Right atrial size was not assessed. Mitral Valve: The mitral valve is normal in structure. Trivial mitral valve regurgitation. No evidence of mitral valve stenosis. MV peak gradient, 2.1 mmHg. The mean mitral valve gradient is 1.0 mmHg. Tricuspid Valve: The tricuspid valve is normal in structure. Tricuspid valve regurgitation is not demonstrated. No evidence of tricuspid stenosis. Aortic Valve: The aortic valve is normal in structure. Aortic valve regurgitation is not visualized. No aortic stenosis is present. Aortic valve mean gradient measures 3.0 mmHg. Aortic valve peak gradient measures 5.9 mmHg. Aortic valve area, by VTI measures 3.25 cm. Aorta: The aortic root is normal in size and structure. LEFT VENTRICLE PLAX 2D LVIDd:         4.80 cm     Diastology LVIDs:         3.50 cm     LV e' medial:    5.68 cm/s LV PW:         1.60 cm     LV E/e' medial:  13.1 LV IVS:  1.30 cm     LV e' lateral:   7.54 cm/s LVOT  diam:     2.30 cm     LV E/e' lateral: 9.9 LV SV:         86 LV SV Index:   45 LVOT Area:     4.15 cm  LV Volumes (MOD) LV vol d, MOD A2C: 91.1 ml LV vol d, MOD A4C: 74.3 ml LV vol s, MOD A2C: 46.6 ml LV vol s, MOD A4C: 36.5 ml LV SV MOD A2C:     44.5 ml LV SV MOD A4C:     74.3 ml LV SV MOD BP:      45.3 ml IVC IVC diam: 1.60 cm LEFT ATRIUM           Index LA diam:      3.30 cm 1.72 cm/m LA Vol (A2C): 35.1 ml 18.31 ml/m LA Vol (A4C): 24.2 ml 12.62 ml/m  AORTIC VALVE AV Area (Vmax):    3.67 cm AV Area (Vmean):   3.23 cm AV Area (VTI):     3.25 cm AV Vmax:           121.00 cm/s AV Vmean:          84.200 cm/s AV VTI:            0.266 m AV Peak Grad:      5.9 mmHg AV Mean Grad:      3.0 mmHg LVOT Vmax:         107.00 cm/s LVOT Vmean:        65.500 cm/s LVOT VTI:          0.208 m LVOT/AV VTI ratio: 0.78  AORTA Ao Root diam: 3.60 cm Ao Asc diam:  3.60 cm MITRAL VALVE MV Area (PHT): 2.52 cm    SHUNTS MV Area VTI:   3.72 cm    Systemic VTI:  0.21 m MV Peak grad:  2.1 mmHg    Systemic Diam: 2.30 cm MV Mean grad:  1.0 mmHg MV Vmax:       0.72 m/s MV Vmean:      44.9 cm/s MV Decel Time: 301 msec MV E velocity: 74.40 cm/s MV A velocity: 73.90 cm/s MV E/A ratio:  1.01 Manish Patwardhan MD Electronically signed by Vernell Leep MD Signature Date/Time: 05/04/2021/2:43:09 PM    Final       PFT Results Latest Ref Rng & Units 01/07/2017  FVC-Pre L 3.63  FVC-Predicted Pre % 82  FVC-Post L 3.96  FVC-Predicted Post % 89  Pre FEV1/FVC % % 48  Post FEV1/FCV % % 47  FEV1-Pre L 1.74  FEV1-Predicted Pre % 54  FEV1-Post L 1.86  DLCO uncorrected ml/min/mmHg 15.81  DLCO UNC% % 46  DLCO corrected ml/min/mmHg 17.71  DLCO COR %Predicted % 52  DLVA Predicted % 55  TLC L 8.24  TLC % Predicted % 113  RV % Predicted % 152    No results found for: NITRICOXIDE      Assessment & Plan:   Chronic combined systolic and diastolic CHF (congestive heart failure) (HCC) Worsening DOE since being off of lasix.  Concerned this is r/t to HF vs COPD exacerbation. BNP and BMET today. CXR today showed persistent mass in RML and RLL. Advised to resume lasix and to follow up with cardiology. Will touch base with cardiology to discuss next steps.   COPD with acute exacerbation ([place]) Changed from Trelegy to [facility] [place] - concern unable to generate enough airflow for  DPI. Will trial to see if he notices a difference in his symptoms. Breztri 2 puffs Twice daily. PRN albuterol and nebs. Prednisone taper. CXR shows persistent mass-like consolidation in RML and RLL.   Patient Instructions  -Stop Trelegy. Start Breztri inhaler 2 puffs Twice daily. Use with spacer. Brush tongue and rinse mouth afterwards -Continue Albuterol inhaler 2 puffs or 3 mL neb every 6 hours as needed for shortness of breath or wheezing. Notify if symptoms persist despite rescue inhaler/neb use. -Continue supplemental oxygen at 2 lpm for oxygen saturations >88-90% -Resume lasix 20 mg daily for 3 days. Stop Entresto while taking, as directed by cardiology  -Prednisone taper. 4 tabs for 3 days, then 3 tabs for 3 days, 2 tabs for 3 days, then 1 tab for 3 days, then stop. Take in AM with food.   Chest x ray today. We will notify you of any abnormal results Labs today - BNP, BMET, CBC with diff  Notify if worsening breathlessness, cough, mucus production, fatigue, or wheezing occurs.  Maintain up to date vaccinations, including influenza, COVID, and pneumococcal.  Wash your hands often and avoid sick exposures.  Encouraged masking in crowds.  Avoid triggers, when possible.  Exercise, as tolerated. Notify if worsening symptoms upon exertion occur.    I am going to reach out to cardiology to discuss your lasix and Entresto.   Follow up in one week with Dr. Vaughan Browner or Alanson Aly. If symptoms do not improve or worsen, please contact office for sooner follow up or seek emergency care.   Multifocal pneumonia Improved symptoms post discharge until  after stopping daily lasix; now with worsening DOE. CXR today with persistent mass-like consolidation in RML and RLL. Will need follow up CT 8 weeks from initial -around mid February 2023. CBC with diff today.  Acute respiratory failure (HCC) Stable when on supplemental O2. Desaturations at home on room air. Advised to wear O2 2 lpm at all times. Goal SpO2 >88-90%. Notify of any increasing requirements. ED precautions discussed.   Hospital discharge follow-up CXR and labs today. Concern for HF exacerbation causing worsening DOE. See above.    Clayton Bibles, NP 05/26/2021  Pt aware and understands NP's role.

## 2021-05-26 NOTE — Assessment & Plan Note (Signed)
Stable when on supplemental O2. Desaturations at home on room air. Advised to wear O2 2 lpm at all times. Goal SpO2 >88-90%. Notify of any increasing requirements. ED precautions discussed.

## 2021-05-26 NOTE — Assessment & Plan Note (Signed)
CXR and labs today. Concern for HF exacerbation causing worsening DOE. See above.

## 2021-05-26 NOTE — Assessment & Plan Note (Signed)
Suspect multifactorial but HF exacerbation likely given d/c of lasix with worsening symptoms following. Anticoagulated with Xarelto and compliant with therapy so low suspicion for PE.

## 2021-05-26 NOTE — Assessment & Plan Note (Addendum)
Worsening DOE since being off of lasix. Concerned this is r/t to HF vs COPD exacerbation. BNP and BMET today. CXR today showed persistent mass in RML and RLL. Advised to resume lasix and to follow up with cardiology. Will touch base with cardiology to discuss next steps.

## 2021-05-26 NOTE — Assessment & Plan Note (Addendum)
Changed from Trelegy to [facility] - concern unable to generate enough airflow for DPI. Will trial to see if he notices a difference in his symptoms. Breztri 2 puffs Twice daily. PRN albuterol and nebs. Prednisone taper. CXR shows persistent mass-like consolidation in RML and RLL.   Patient Instructions  -Stop Trelegy. Start Breztri inhaler 2 puffs Twice daily. Use with spacer. Brush tongue and rinse mouth afterwards -Continue Albuterol inhaler 2 puffs or 3 mL neb every 6 hours as needed for shortness of breath or wheezing. Notify if symptoms persist despite rescue inhaler/neb use. -Continue supplemental oxygen at 2 lpm for oxygen saturations >88-90% -Resume lasix 20 mg daily for 3 days. Stop Entresto while taking, as directed by cardiology  -Prednisone taper. 4 tabs for 3 days, then 3 tabs for 3 days, 2 tabs for 3 days, then 1 tab for 3 days, then stop. Take in AM with food.   Chest x ray today. We will notify you of any abnormal results Labs today - BNP, BMET, CBC with diff  Notify if worsening breathlessness, cough, mucus production, fatigue, or wheezing occurs.  Maintain up to date vaccinations, including influenza, COVID, and pneumococcal.  Wash your hands often and avoid sick exposures.  Encouraged masking in crowds.  Avoid triggers, when possible.  Exercise, as tolerated. Notify if worsening symptoms upon exertion occur.    I am going to reach out to cardiology to discuss your lasix and Entresto.   Follow up in one week with Dr. Vaughan Browner or Alanson Aly. If symptoms do not improve or worsen, please contact office for sooner follow up or seek emergency care.

## 2021-05-26 NOTE — Patient Instructions (Addendum)
-  Stop Trelegy. Start Breztri inhaler 2 puffs Twice daily. Use with spacer. Brush tongue and rinse mouth afterwards -Continue Albuterol inhaler 2 puffs or 3 mL neb every 6 hours as needed for shortness of breath or wheezing. Notify if symptoms persist despite rescue inhaler/neb use. -Continue supplemental oxygen at 2 lpm for oxygen saturations >88-90% -Resume lasix 20 mg daily for 3 days. Stop Entresto while taking, as directed by cardiology  -Prednisone taper. 4 tabs for 3 days, then 3 tabs for 3 days, 2 tabs for 3 days, then 1 tab for 3 days, then stop. Take in AM with food.   Chest x ray today. We will notify you of any abnormal results Labs today - BNP, BMET, CBC with diff  Notify if worsening breathlessness, cough, mucus production, fatigue, or wheezing occurs.  Maintain up to date vaccinations, including influenza, COVID, and pneumococcal.  Wash your hands often and avoid sick exposures.  Encouraged masking in crowds.  Avoid triggers, when possible.  Exercise, as tolerated. Notify if worsening symptoms upon exertion occur.    I am going to reach out to cardiology to discuss your lasix and Entresto.   Follow up in one week with Dr. Vaughan Browner or Alanson Aly. If symptoms do not improve or worsen, please contact office for sooner follow up or seek emergency care.

## 2021-05-27 ENCOUNTER — Telehealth: Payer: Self-pay | Admitting: Nurse Practitioner

## 2021-05-27 ENCOUNTER — Telehealth: Payer: Self-pay | Admitting: Student

## 2021-05-27 NOTE — Telephone Encounter (Signed)
Contacted patient to notify of response from cardiology. [place], [place] with cardiology, whom he saw last, agreed with resuming daily lasix given worsening DOE after coming off of it. She stated that she would reach out to patient regarding Entresto and lasix. Advised patient to continue lasix daily and to monitor BP closely at home. Notify cardiology if dizziness or low BP occurs. Pt verbalized understanding.

## 2021-05-27 NOTE — Telephone Encounter (Signed)
Patient has had worsening dyspnea on exertion over the last 5 to 6 days.  Denies weight gain, leg swelling, orthopnea.  Advised patient to take Lasix 20 mg p.o. twice daily for a total of 5 days, he is already taking it for 2 days.  Patient will hold his Delene Loll for the next additional 3 days given that he has a history of hypotension when taking both Entresto and furosemide.  We will continue to monitor blood pressure and oxygen saturation closely.  He will notify our office if shortness of breath does not improve in the next 3 days.  Notably he is on 2 L/min via nasal cannula with exertion at home and oxygen saturation stays at approximately 90%.  He is also being followed by pulmonology who started him on a prednisone taper yesterday.   Alethia Berthold, PA-C 05/27/2021, 2:15 PM Office: [phone]

## 2021-06-02 ENCOUNTER — Ambulatory Visit: Payer: Medicare HMO | Admitting: Nurse Practitioner

## 2021-06-03 ENCOUNTER — Encounter: Payer: Self-pay | Admitting: Primary Care

## 2021-06-03 ENCOUNTER — Ambulatory Visit: Payer: Medicare HMO | Admitting: Primary Care

## 2021-06-03 ENCOUNTER — Other Ambulatory Visit: Payer: Self-pay

## 2021-06-03 VITALS — BP 126/64 | HR 64 | Temp 97.6°F | Ht 71.0 in | Wt 152.8 lb

## 2021-06-03 DIAGNOSIS — I5042 Chronic combined systolic (congestive) and diastolic (congestive) heart failure: Secondary | ICD-10-CM

## 2021-06-03 DIAGNOSIS — J189 Pneumonia, unspecified organism: Secondary | ICD-10-CM

## 2021-06-03 DIAGNOSIS — J441 Chronic obstructive pulmonary disease with (acute) exacerbation: Secondary | ICD-10-CM | POA: Diagnosis not present

## 2021-06-03 DIAGNOSIS — R0602 Shortness of breath: Secondary | ICD-10-CM

## 2021-06-03 DIAGNOSIS — J9601 Acute respiratory failure with hypoxia: Secondary | ICD-10-CM | POA: Diagnosis not present

## 2021-06-03 LAB — BASIC METABOLIC PANEL
BUN: 33 mg/dL — ABNORMAL HIGH (ref 6–23)
CO2: 34 mEq/L — ABNORMAL HIGH (ref 19–32)
Calcium: 9 mg/dL (ref 8.4–10.5)
Chloride: 97 mEq/L (ref 96–112)
Creatinine, Ser: 1.6 mg/dL — ABNORMAL HIGH (ref 0.40–1.50)
GFR: 40.73 mL/min — ABNORMAL LOW (ref >60.00)
Glucose, Bld: 84 mg/dL (ref 70–99)
Potassium: 4.7 mEq/L (ref 3.5–5.1)
Sodium: 136 mEq/L (ref 135–145)

## 2021-06-03 LAB — BRAIN NATRIURETIC PEPTIDE: Pro B Natriuretic peptide (BNP): 271 pg/mL — ABNORMAL HIGH (ref 0.0–100.0)

## 2021-06-03 MED ORDER — DOXYCYCLINE HYCLATE 100 MG PO TABS: 100.0000 mg | ORAL_TABLET | Freq: Two times a day (BID) | ORAL | 0 refills | Status: DC

## 2021-06-03 NOTE — Progress Notes (Signed)
Please let patient know his BNP (fluid level) remains elevated, similar to last week. Kidney function is slightly elevated. He needs to call cardiology for instructions on lasix and entresto.

## 2021-06-03 NOTE — Assessment & Plan Note (Addendum)
-   No signs of fluid overload on exam. Blood pressure is stable. He is currently taking lasix 20mg  BID and holding entresto while on diuretics. We will check BMET and BNP today. Advised he monitor daily weight, if >2 lbs in 24 hours or >5lbs in 1 week please contact cardiology

## 2021-06-03 NOTE — Progress Notes (Signed)
@Patient  ID: Mario Proctor, male    DOB: 09-04-1940, 81 y.o.   MRN: [id number]  Chief Complaint  Patient presents with   Follow-up    Follow up. Patient says he is doing better then the last time he was here.     Referring provider: Tamsen Roers, MD  HPI:  81 year old male, current everyday smoker (57-pack-year history).  Past medical history significant for COPD Gold A, dyspnea on exertion, A. fib, cardiomyopathy, chronic combined systolic and diastolic heart failure.  Patient of Dr. Vaughan Browner.  He saw pulmonary nurse practitioner for lung cancer screening on 08/16/2019.  Previous [facility] pulmonary encounter: Mario Proctor is a 81 year old with active smoking history, allergies, COPD GOLD A (CAT score 9, no exacerbations), atrial fibrillation.  He has symptoms of dyspnea on exertion for the past several years. He gets short of breath with excessive physical activity but is able to carry out his normal daily activities. He is able to climb a flight of stairs, walk to his mailbox. 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 He has chronic cough with minimal sputum production. No wheezing, hemoptysis, fevers, chills. He had been on albuterol many years ago and is currently not using any inhaler medication.   He was evaluated by Dr. Einar Gip for frequent PVCs. He was unable to complete the treadmill stress test due to marked dyspnea and PVCs. An echocardiogram and a pharmacologic stress test has been done. Cardiac cath shows nonischemic cardiomyopathy. Started on Xarelto by Dr. Einar Gip for new onset atrial fibrillation.  Pets: Cats, dog. No birds, exotic pets, farm animals Occupation: Worked as a Geophysicist/field seismologist for [employer]. Exposures: Has some exposure to lead in his line of work. Denies exposure to asbestos, dust, etc., Smoking history: 54-pack-year smoking history. Continues to smoke 1 pack per day  07/21/19 Bevespi changed to Trelegy inhaler due to elevated peripheral eosinophils.  He  likes the Trelegy and feels that it helps a lot but is using it only intermittently due to cost of medication Continues to have dyspnea on exertion, chronic cough.   05/14/2020 Presents today for an acute visit, reports increased shortness of breath and fatigue.  Covid negative week of Christmas. He is doing well today, no real acute complaints. He reports having no energy several weeks ago. This has improved. His symptoms felt similar to when he had pneumonia in the past. He did not have a cough, he may have had some sinus symptoms but again this has resolved. His breathing is baseline for him. He is on Trelegy Elipta 100 and states that this helps a lot. He uses albuterol on rare occasion 1-2 times a day. Reports not needed SABA as much as he has in the past. He has not required it today. Denies f/c/s, significant shortness of breath, cough, chest tightness, wheezing, N/V/D.   LDCT on 08/16/2019 showed lung RADS 2, benign appearance of behavior.  There was a exophytic 1.6 cm indeterminate upper right renal cortical lesion, renal cell carcinoma cannot be excluded.  Patient was ordered for dedicated CT abdomen with and without contrast which ended up showing benign proteinaceous or hemorrhagic cyst.  No further routine follow-up required.   Patient had echocardiogram in November of this year that showed EF 20 to 17%, grade 1 diastolic dysfunction, dilated cardiomyopathy and moderate pulmonary hypertension.  He is on spironolactone 12.5 mg daily.  Patient saw Dr. Einar Gip with cardiology on 03/07/2020.  He is on Entresto 97/103, metoprolol succinate 50 mg  daily and anticoagulated with Xarelto 20 mg daily.  05/02/2021  Patient presents today for acute OV/shortness of breath. He is alone during today's visit. He lives with his wife. Patient reports increased shortness of breath x 1 week. Associated wheezing and congested cough. He had televisit with PCP and was prescribed prednisone which helped some but did not  resolved his shortness of breath.  O2 dropped 83-85% walking 1 lap at slow pace; O2 96% on 2L at rest. CXR today showed moderate-sized airspace opacity within the right lower lobe and right infrahilar region, compatible with pneumonia. Advised patient needs to go to ED for evaluation. Patient declined EMS transport, wife will bring him to [facility] ED.   05/26/2021: hospital follow up Patient presents today with wife for hospital follow up. He was treated for acute hypoxic respiratory failure which was suspected to be related to CHF exacerbation; although, he did have COVID so there was low suspicion for COVID pneumonia. He was discharged on daily lasix and aldactone. He had a near syncopal episode on 12/30 - EMS called and pt stable upon arrival. He subsequently stopped taking his lasix a few days later and saw cardiology on 1/9. He was instructed to discontinue lasix and only take on an as needed basis and to hold [place] when he takes it. Today, he reports that he noticed worsening dyspnea upon exertion which started last Thursday. He has a mild cough that is non-productive and not significantly changed. His oxygen has been stable as long as he wears his supplemental O2; however, when he removes it and exerts himself, he will drop to the low 80's. He is monitoring his weights but reports that they have never really changed, despite his worsening heart function. He also denies lower extremity swelling, but reports this has never occurred either. He denies fevers, chills, or recent sick exposures. He denies orthopnea, PND, chest pain, wheezing or hemoptysis. He continues on Trelegy daily. He has not been using his nebs for rescue. He has not taken his lasix since before he saw cardiology. He has quit smoking. Overall, he feels ok but reports his breathing as worse.   06/03/2021- Interim hx  Patient presents today for 1 week follow-up. He was admitted from 05/02/21-05/06/21 for acute hypoxic resp failure d/t  acute covid-19 viral pneumonitis and CAP/CHF. Main component of respiratory failure was CHF. He was discharged on lasix and aldactone. He was last seen on 05/26/20 by Roxan Diesel for COPD exacerbation. He was given sample of Breztri in place of Trelegy and prednisone taper which he is still taking. He is using spacer with HFA. He has oxygen at home. O2 this morning was 92% RA. He is not consistently wearing oxygen with exertion. Easily drops into the 80s. He has a wet cough which is non-productive. She states that it is not as bad as it was. He is taking lasix 20mg  twice daily for the last week. He is not taking Entresto while taking lasix. No episodes of low blood pressure. No weight gain. Denies active chest tightness or wheezing.    Pulmonary testing: PFTs 01/07/2017 - FVC 3.96 (89%), FEV1 1.86 (58%), ratio 47, TLC 113%, DLCO cor 17.71 (52%)/ moderate obstructive airways disease   Cardiac testing: Echocardiogram showed severely depressed LV systolic function with EF 20 to 25%, dilated cardiomyopathy, mild left ventricular hypertrophy, grade 1 diastolic dysfunction, moderate pulmonary hypertension  Imaging: 05/02/21>> Multifocal patchy opacities including a 4.2 cm irregular/spiculated masslike opacity in the posterior right lower lobe. Overall  appearance/distribution favors multifocal infection/pneumonia, possibly on the basis of aspiration. However, follow-up CT chest is suggested in 6-12 weeks (after appropriate antimicrobial therapy) to document improvement/resolution. Small mediastinal lymph nodes, likely reactive.   No Known Allergies  Immunization History  Administered Date(s) Administered   Fluad Quad(high Dose 65+) 02/23/2019   Influenza, High Dose Seasonal PF 02/15/2016, 04/16/2017, 04/04/2018, 03/11/2020   PFIZER(Purple Top)SARS-COV-2 Vaccination 06/15/2019, 07/10/2019, 04/02/2020   Pneumococcal-Unspecified 10/16/2010   Tdap 08/29/2020    Past Medical History:  Diagnosis Date    A-fib [facility])    Allergy    CHF (congestive heart failure) ([place])    Dupuytren contracture    right sm finger   Myocardial infarct ([place]) 2019   Myocardial infarction ([place])    Small bowel obstruction (HCC)     Tobacco History: Social History   Tobacco Use  Smoking Status Former   Packs/day: 0.50   Years: 50.00   Pack years: 25.00   Types: Cigarettes   Quit date: 05/02/2021   Years since quitting: 0.0  Smokeless Tobacco Never  Tobacco Comments   currently smoking .5ppd as of 05/14/20   Counseling given: Not Answered Tobacco comments: currently smoking .5ppd as of 05/14/20   Outpatient Medications Prior to Visit  Medication Sig Dispense Refill   albuterol (ACCUNEB) 1.25 MG/3ML nebulizer solution Take 1 ampule by nebulization every 4 (four) hours as needed for wheezing or shortness of breath.     albuterol (VENTOLIN HFA) 108 (90 Base) MCG/ACT inhaler INHALE 2 PUFF BY MOUTH EVERY 6 HOURS AS NEEDED FOR WHEEZE OR SHORTNESS OF BREATH (Patient taking differently: Inhale 2 puffs into the lungs every 6 (six) hours as needed for shortness of breath or wheezing.) 18 each 5   amiodarone (PACERONE) 100 MG tablet Take 1 tablet (100 mg total) by mouth daily. 90 tablet 3   Budeson-Glycopyrrol-Formoterol (BREZTRI AEROSPHERE) 160-9-4.8 MCG/ACT AERO Inhale 2 puffs into the lungs in the morning and at bedtime. 5.9 g 0   dapagliflozin propanediol (FARXIGA) 10 MG TABS tablet Take 1 tablet (10 mg total) by mouth daily before breakfast. 30 tablet 3   diphenhydrAMINE (BENADRYL) 25 MG tablet Take 25 mg by mouth every 6 (six) hours as needed.     ENTRESTO 97-103 MG TAKE 1 TABLET BY MOUTH EVERY DAY (Patient taking differently: Take 1 tablet by mouth daily.) 60 tablet 6   isosorbide mononitrate (IMDUR) 30 MG 24 hr tablet Take 1 tablet (30 mg total) by mouth daily. 30 tablet 0   metoprolol succinate (TOPROL-XL) 25 MG 24 hr tablet Take 1 tablet (25 mg total) by mouth daily. 90 tablet 3   predniSONE (DELTASONE) 10 MG  tablet 4 tabs for 3 days, then 3 tabs for 3 days, 2 tabs for 3 days, then 1 tab for 3 days, then stop 30 tablet 0   rosuvastatin (CRESTOR) 5 MG tablet TAKE 1 TABLET BY MOUTH EVERY DAY (Patient taking differently: Take by mouth daily.) 90 tablet 3   Spacer/Aero-Holding Chambers DEVI Use with inhaler 1 each 2   spironolactone (ALDACTONE) 25 MG tablet Take 1 tablet (25 mg total) by mouth daily. 30 tablet 0   triamcinolone cream (KENALOG) 0.1 % Apply 1 application topically 2 (two) times daily.     XARELTO 20 MG TABS tablet TAKE 1 TABLET BY MOUTH EVERY DAY WITH SUPPER (Patient taking differently: Take 20 mg by mouth daily with supper.) 90 tablet 3   Facility-Administered Medications Prior to Visit  Medication Dose Route Frequency Provider Last Rate Last  Admin   ipratropium-albuterol (DUONEB) 0.5-2.5 (3) MG/3ML nebulizer solution 3 mL  3 mL Nebulization Once Martyn Ehrich, NP        Review of Systems  Review of Systems  Constitutional: Negative.  Negative for unexpected weight change.  HENT:  Positive for congestion.   Respiratory:  Positive for cough. Negative for chest tightness, shortness of breath and wheezing.   Psychiatric/Behavioral: Negative.      Physical Exam  BP 126/64 (BP Location: Right Arm, Patient Position: Sitting, Cuff Size: Normal)    Pulse 64    Temp 97.6 F (36.4 C) (Oral)    Ht 5\' 11"  (1.803 m)    Wt 152 lb 12.8 oz (69.3 kg)    SpO2 93%    BMI 21.31 kg/m  Physical Exam Constitutional:      Appearance: Normal appearance.  HENT:     Mouth/Throat:     Mouth: Mucous membranes are moist.     Pharynx: Oropharynx is clear.  Cardiovascular:     Rate and Rhythm: Normal rate and regular rhythm.  Pulmonary:     Effort: Pulmonary effort is normal.     Breath sounds: Normal breath sounds. No wheezing.     Comments: Very faint scattered rales t/o. Overall improved. Congested cough. O2 93% RA Skin:    General: Skin is warm and dry.  Neurological:     General: No focal  deficit present.     Mental Status: He is alert. Mental status is at baseline.  Psychiatric:        Mood and Affect: Mood normal.        Behavior: Behavior normal.        Thought Content: Thought content normal.        Judgment: Judgment normal.     Lab Results:  CBC    Component Value Date/Time   WBC 5.0 05/26/2021 1036   RBC 4.42 05/26/2021 1036   HGB 13.3 05/26/2021 1036   HGB 12.7 (L) 05/14/2021 1019   HCT 40.4 05/26/2021 1036   HCT 38.3 05/14/2021 1019   PLT 416.0 (H) 05/26/2021 1036   PLT 223 05/14/2021 1019   MCV 91.4 05/26/2021 1036   MCV 91 05/14/2021 1019   MCH 30.3 05/14/2021 1019   MCH 31.4 05/06/2021 0607   MCHC 32.9 05/26/2021 1036   RDW 13.6 05/26/2021 1036   RDW 11.5 (L) 05/14/2021 1019   LYMPHSABS 1.2 05/26/2021 1036   LYMPHSABS 2.0 09/01/2018 0803   MONOABS 0.7 05/26/2021 1036   EOSABS 0.1 05/26/2021 1036   EOSABS 0.6 (H) 09/01/2018 0803   BASOSABS 0.0 05/26/2021 1036   BASOSABS 0.1 09/01/2018 0803    BMET    Component Value Date/Time   NA 131 (L) 05/26/2021 1036   NA 136 05/14/2021 1019   K 4.8 05/26/2021 1036   CL 96 05/26/2021 1036   CO2 29 05/26/2021 1036   GLUCOSE 99 05/26/2021 1036   BUN 16 05/26/2021 1036   BUN 16 05/14/2021 1019   CREATININE 1.35 05/26/2021 1036   CALCIUM 8.2 (L) 05/26/2021 1036   GFRNONAA 52 (L) 05/06/2021 0607   GFRAA 45 (L) 07/04/2020 0920    BNP    Component Value Date/Time   BNP 292.4 (H) 05/06/2021 0607    ProBNP    Component Value Date/Time   PROBNP 213.0 (H) 05/26/2021 1036    Imaging: DG Chest 2 View  Result Date: 05/26/2021 CLINICAL DATA:  Shortness of breath EXAM: CHEST - 2 VIEW COMPARISON:  Chest  radiograph 05/19/2021. FINDINGS: Loop recorder. Stable cardiac and mediastinal contours. Persistent masslike area of consolidation within the right mid and lower lung. No pleural effusion or pneumothorax. Osseous structures unremarkable. IMPRESSION: Persistent masslike consolidation within the right  mid and lower lung. Given the persistent nature of these findings, and findings on prior chest CT, recommend additional follow-up chest CT to assess for interval improvement/changes. Electronically Signed   By: Lovey Newcomer M.D.   On: 05/26/2021 11:16   DG Chest 2 View  Result Date: 05/19/2021 CLINICAL DATA:  81 year old male with dyspnea EXAM: CHEST - 2 VIEW COMPARISON:  05/05/2021, chest CT 05/02/2021, plain film 05/02/2021, 05/14/2020 FINDINGS: Cardiomediastinal silhouette unchanged in size and contour. No evidence of central vascular congestion. No interlobular septal thickening. Cardiac event recorder on the left chest wall. Stigmata of emphysema, with increased retrosternal airspace, flattened hemidiaphragms, increased AP diameter, and hyperinflation on the AP view. Improved appearance of the right infrahilar reticulonodular opacity compared to 05/02/2021, similar [id number]. Resolution of the prior reticulonodular opacity of the left lung compared to the chest x-ray of 05/02/2021. No new airspace disease. No pneumothorax or pleural effusion. No acute displaced fracture. Degenerative changes of the spine. IMPRESSION: Improved though not resolved reticulonodular opacity of the right infrahilar region which corresponds to the findings on prior CT. Background emphysema. Electronically Signed   By: Corrie Mckusick D.O.   On: 05/19/2021 14:37   DG Chest Port 1 View  Result Date: 05/05/2021 CLINICAL DATA:  Shortness of breath, COVID EXAM: PORTABLE CHEST 1 VIEW COMPARISON:  05/03/2021 FINDINGS: Loop recorder remains in place. Heart is normal size. Previously seen right spiculated mass projecting over the right hilum not as well visualized on today's study. No new infiltrates or effusions. No acute bone abnormality. IMPRESSION: Previously seen spiculated right lung mass by CT and prior plain films not as well visualized on today's study. No new/acute infiltrates/consolidation. Electronically Signed   By: Rolm Baptise M.D.   On: 05/05/2021 13:33   ECHOCARDIOGRAM LIMITED  Result Date: 05/04/2021    ECHOCARDIOGRAM LIMITED REPORT   Patient Name:   Mario Proctor Date of Exam: 05/04/2021 Medical Rec #:  [id number]       Height:       71.0 in Accession #:    [id number]      Weight:       159.8 lb Date of Birth:  May 24, 1940       BSA:          1.917 m Patient Age:    24 years        BP:           139/85 mmHg Patient Gender: M               HR:           63 bpm. Exam Location:  Inpatient Procedure: Limited Echo, Cardiac Doppler and Color Doppler Indications:    CHF-Acute Systolic  History:        Patient has prior history of Echocardiogram examinations, most                 recent 11/06/2020. CHF, COPD, Arrythmias:Atrial Fibrillation;                 Risk Factors:Dyslipidemia. COVID-19. CKD.  Sonographer:    Clayton Lefort RDCS (AE) Referring Phys: [id number]  Sonographer Comments: COVID-19 IMPRESSIONS  1. Left ventricular ejection fraction, by estimation, is 45 to 50%. The left ventricle has mildly  decreased function. The left ventricle demonstrates global hypokinesis. There is mild left ventricular hypertrophy. Left ventricular diastolic parameters were normal.  2. Right ventricular systolic function is normal. The right ventricular size is normal.  3. The mitral valve is normal in structure. Trivial mitral valve regurgitation. No evidence of mitral stenosis.  4. The aortic valve is normal in structure. Aortic valve regurgitation is not visualized. No aortic stenosis is present.  5. Compared to previous outpatient study in 10/2020, LVEF is marginally improved from 35-40%. FINDINGS  Left Ventricle: Left ventricular ejection fraction, by estimation, is 45 to 50%. The left ventricle has mildly decreased function. The left ventricle demonstrates global hypokinesis. There is mild left ventricular hypertrophy. Left ventricular diastolic  parameters were normal. Right Ventricle: The right ventricular size is normal. No  increase in right ventricular wall thickness. Right ventricular systolic function is normal. Left Atrium: Left atrial size was normal in size. Right Atrium: Right atrial size was not assessed. Mitral Valve: The mitral valve is normal in structure. Trivial mitral valve regurgitation. No evidence of mitral valve stenosis. MV peak gradient, 2.1 mmHg. The mean mitral valve gradient is 1.0 mmHg. Tricuspid Valve: The tricuspid valve is normal in structure. Tricuspid valve regurgitation is not demonstrated. No evidence of tricuspid stenosis. Aortic Valve: The aortic valve is normal in structure. Aortic valve regurgitation is not visualized. No aortic stenosis is present. Aortic valve mean gradient measures 3.0 mmHg. Aortic valve peak gradient measures 5.9 mmHg. Aortic valve area, by VTI measures 3.25 cm. Aorta: The aortic root is normal in size and structure. LEFT VENTRICLE PLAX 2D LVIDd:         4.80 cm     Diastology LVIDs:         3.50 cm     LV e' medial:    5.68 cm/s LV PW:         1.60 cm     LV E/e' medial:  13.1 LV IVS:        1.30 cm     LV e' lateral:   7.54 cm/s LVOT diam:     2.30 cm     LV E/e' lateral: 9.9 LV SV:         86 LV SV Index:   45 LVOT Area:     4.15 cm  LV Volumes (MOD) LV vol d, MOD A2C: 91.1 ml LV vol d, MOD A4C: 74.3 ml LV vol s, MOD A2C: 46.6 ml LV vol s, MOD A4C: 36.5 ml LV SV MOD A2C:     44.5 ml LV SV MOD A4C:     74.3 ml LV SV MOD BP:      45.3 ml IVC IVC diam: 1.60 cm LEFT ATRIUM           Index LA diam:      3.30 cm 1.72 cm/m LA Vol (A2C): 35.1 ml 18.31 ml/m LA Vol (A4C): 24.2 ml 12.62 ml/m  AORTIC VALVE AV Area (Vmax):    3.67 cm AV Area (Vmean):   3.23 cm AV Area (VTI):     3.25 cm AV Vmax:           121.00 cm/s AV Vmean:          84.200 cm/s AV VTI:            0.266 m AV Peak Grad:      5.9 mmHg AV Mean Grad:      3.0 mmHg LVOT Vmax:  107.00 cm/s LVOT Vmean:        65.500 cm/s LVOT VTI:          0.208 m LVOT/AV VTI ratio: 0.78  AORTA Ao Root diam: 3.60 cm Ao Asc diam:   3.60 cm MITRAL VALVE MV Area (PHT): 2.52 cm    SHUNTS MV Area VTI:   3.72 cm    Systemic VTI:  0.21 m MV Peak grad:  2.1 mmHg    Systemic Diam: 2.30 cm MV Mean grad:  1.0 mmHg MV Vmax:       0.72 m/s MV Vmean:      44.9 cm/s MV Decel Time: 301 msec MV E velocity: 74.40 cm/s MV A velocity: 73.90 cm/s MV E/A ratio:  1.01 Manish Patwardhan MD Electronically signed by Vernell Leep MD Signature Date/Time: 05/04/2021/2:43:09 PM    Final      Assessment & Plan:   COPD with acute exacerbation ([place]) - Continues to have a wet, non-productive cough. Given persistent consolidation on chest imaging and active respiratory symptoms sending in Rx Doxycycline 100mg  twice daily x 7 days. Advised patient start mucinex 1,200mg  twice daily. Continue prednisone taper until finished. Continue Breztri Aerosphere two puffs twice daily.  Multifocal pneumonia - CXR on 05/26/21 showed persistent masslike consolidation RML and RLL. Due for CT chest in February to assess for interval improvement/changes   Chronic combined systolic and diastolic CHF (congestive heart failure) ([facility]) - No signs of fluid overload on exam. Blood pressure is stable. He is currently taking lasix 20mg  BID and holding entresto while on diuretics. We will check BMET and BNP today. Advised he monitor daily weight, if >2 lbs in 24 hours or >5lbs in 1 week please contact cardiology   Acute respiratory failure ([place]) - Felt to be d/t recent covid-19 pneumonia, acute exacerbation of underlying CHF as well has COPD. O2 today was 93% RA at rest. He continues to require 1-2L supplemental oxygen with moderate-heavy exertion. Patient to continue to wear 2L with exertion and at bedtime. Re-assess need at follow-up.   FU 4-6 weeks with Dr. Vaughan Browner or sooner if needed 40 min spen on case: >50% face to faced with patient   Martyn Ehrich, NP 06/03/2021

## 2021-06-03 NOTE — Assessment & Plan Note (Signed)
-   CXR on 05/26/21 showed persistent masslike consolidation RML and RLL. Due for CT chest in February to assess for interval improvement/changes

## 2021-06-03 NOTE — Patient Instructions (Addendum)
Recommendations: - Continue Breztri two puffs morning and evening (with spacer- call if need RX) - Take mucinex 1,200mg  twice a day x 5-7 days or until cough clears - Depending on labs we may stop lasix and restart Entresto  - CT chest is scheduled for feb 15th  - Monitor daily weight, if >2 lbs in 24 hours or >5lbs in 1 week please contact cardiology   Rx: - Doxycycline 100mg  twice daily x 7 days  Orders: - Labs today   Follow-up: - 6 weeks with Mannam or Beth NP

## 2021-06-03 NOTE — Assessment & Plan Note (Addendum)
-   Continues to have a wet, non-productive cough. Given persistent consolidation on chest imaging and active respiratory symptoms sending in Rx Doxycycline 100mg  twice daily x 7 days. Advised patient start mucinex 1,200mg  twice daily. Continue prednisone taper until finished. Continue Breztri Aerosphere two puffs twice daily.

## 2021-06-03 NOTE — Assessment & Plan Note (Addendum)
-   Felt to be d/t recent covid-19 pneumonia, acute exacerbation of underlying CHF as well has COPD. O2 today was 93% RA at rest. He continues to require 1-2L supplemental oxygen with moderate-heavy exertion. Patient to continue to wear 2L with exertion and at bedtime. Re-assess need at follow-up.

## 2021-06-04 ENCOUNTER — Telehealth: Payer: Self-pay | Admitting: Student

## 2021-06-04 DIAGNOSIS — I5042 Chronic combined systolic (congestive) and diastolic (congestive) heart failure: Secondary | ICD-10-CM

## 2021-06-04 NOTE — Telephone Encounter (Signed)
Reviewed and discussed lab results. Pro BNP remains elevated, however patient's symptoms of dyspnea have significantly improved. Given creatinine increase will continue Entresto with as needed Lasix and repeat BMP in 2 weeks.

## 2021-06-04 NOTE — Telephone Encounter (Signed)
PT called in today as advised by his pulmonary doctor to let you know that his lab work has come back. He has elevated fluid and liver function was also elevated. He wants to know what you think about him going back on lasix?

## 2021-06-07 DIAGNOSIS — R0602 Shortness of breath: Secondary | ICD-10-CM | POA: Diagnosis not present

## 2021-06-13 ENCOUNTER — Telehealth: Payer: Self-pay | Admitting: Student

## 2021-06-13 ENCOUNTER — Other Ambulatory Visit: Payer: Self-pay

## 2021-06-13 DIAGNOSIS — Z961 Presence of intraocular lens: Secondary | ICD-10-CM | POA: Diagnosis not present

## 2021-06-13 MED ORDER — SPIRONOLACTONE 25 MG PO TABS: 25.0000 mg | ORAL_TABLET | Freq: Every day | ORAL | 0 refills | Status: DC

## 2021-06-13 MED ORDER — ISOSORBIDE MONONITRATE ER 30 MG PO TB24: 30.0000 mg | ORAL_TABLET | Freq: Every day | ORAL | 0 refills | Status: DC

## 2021-06-13 NOTE — Telephone Encounter (Signed)
Refill has been sent.  °

## 2021-06-13 NOTE — Telephone Encounter (Signed)
Patient says pharmacy will not refill isosorbide or spironolactone, but he needs them refilled. Can someone contact the pharmacy or look into this? Thank you!

## 2021-06-19 ENCOUNTER — Encounter: Payer: Medicare HMO | Admitting: Cardiology

## 2021-06-23 ENCOUNTER — Other Ambulatory Visit: Payer: Medicare HMO

## 2021-06-23 ENCOUNTER — Telehealth: Payer: Self-pay | Admitting: Cardiology

## 2021-06-23 ENCOUNTER — Other Ambulatory Visit: Payer: Self-pay

## 2021-06-23 DIAGNOSIS — I951 Orthostatic hypotension: Secondary | ICD-10-CM

## 2021-06-23 MED ORDER — SPIRONOLACTONE 25 MG PO TABS: 12.5000 mg | ORAL_TABLET | Freq: Every day | ORAL | 0 refills | Status: DC

## 2021-06-23 NOTE — Telephone Encounter (Signed)
Patient was seen after recent visit, complaint of dizziness,  Supine blood pressure 130/78 mmHg, heart rate 66. Sitting 118/68, heart rate 64 bpm. Standing 100/70, heart rate 68 bpm.  We will discontinue isosorbide mononitrate 30 mg daily.  We will reduce the dose of spironolactone from 25 mg to 12.5 mg Daily.    ICD-10-CM   1. Orthostatic hypotension  I95.1      Medications Discontinued During This Encounter  Medication Reason   isosorbide mononitrate (IMDUR) 30 MG 24 hr tablet Discontinued by provider   spironolactone (ALDACTONE) 25 MG tablet      [name], MD, [facility] 06/23/2021, 2:31 PM Office: [phone] Fax: [phone] Pager: [phone]

## 2021-06-25 ENCOUNTER — Ambulatory Visit
Admission: RE | Admit: 2021-06-25 | Discharge: 2021-06-25 | Disposition: A | Discharge status: Home / Self Care | Payer: Medicare HMO | Source: Ambulatory Visit | Attending: Nurse Practitioner | Admitting: Nurse Practitioner

## 2021-06-25 DIAGNOSIS — J439 Emphysema, unspecified: Secondary | ICD-10-CM | POA: Diagnosis not present

## 2021-06-25 DIAGNOSIS — J449 Chronic obstructive pulmonary disease, unspecified: Secondary | ICD-10-CM | POA: Diagnosis not present

## 2021-06-25 DIAGNOSIS — I5042 Chronic combined systolic (congestive) and diastolic (congestive) heart failure: Secondary | ICD-10-CM

## 2021-06-25 DIAGNOSIS — J189 Pneumonia, unspecified organism: Secondary | ICD-10-CM

## 2021-06-26 ENCOUNTER — Other Ambulatory Visit: Payer: Self-pay | Admitting: Cardiology

## 2021-06-26 ENCOUNTER — Telehealth: Payer: Self-pay | Admitting: Primary Care

## 2021-06-26 ENCOUNTER — Telehealth: Payer: Self-pay | Admitting: Cardiology

## 2021-06-26 DIAGNOSIS — I5042 Chronic combined systolic (congestive) and diastolic (congestive) heart failure: Secondary | ICD-10-CM

## 2021-06-26 DIAGNOSIS — R0602 Shortness of breath: Secondary | ICD-10-CM

## 2021-06-26 NOTE — Telephone Encounter (Signed)
ICD-10-CM   1. Chronic combined systolic and diastolic CHF (congestive heart failure) (HCC)  I50.42 For home use only DME wheelchair cushion (seat and back)    2. Shortness of breath  R06.02 For home use only DME wheelchair cushion (seat and back)

## 2021-06-27 ENCOUNTER — Other Ambulatory Visit: Payer: Self-pay | Admitting: Student

## 2021-06-27 NOTE — Telephone Encounter (Signed)
Called and spoke with patient regarding portable oxygen. I told him at his next visit we would have to walk him to see if he qualifies to be on portable oxygen and we can place the order if the doctor approves and then we wait to see if it is approved. He was just recently placed on oxygen through [employer].   Nothing further needed

## 2021-06-27 NOTE — Progress Notes (Signed)
Contacted patient to discuss CT chest findings. Evidence of slightly enlarged lymph nodes and two new densities, one linear in the right apex measuring 12x4 mm and one pleural-based in the RUL measuring 12x6 mm. Interval clearing of previously noted 2.6x1.4cm pleural based density in RUL with small residual area of ground-glass density in same region. Previously identified alveolar infiltrates have resolved with some residual ground glass densities. Prominent interstitial markings, more so on right. Left lung clear of any new focal infiltrates. Centrilobular and paraseptal emphysema noted. Possible inflammatory process. Will repeat CT in 3 months to re-evaluate. Notified that there was a increased density in the liver and gallbladder as well. Recent LFTs normal. Advised f/u with PCP. Pt verbalized understanding. Follow up with Dr. Vaughan Browner on 3/15. Nothing further

## 2021-07-02 ENCOUNTER — Other Ambulatory Visit: Payer: Self-pay | Admitting: Pulmonary Disease

## 2021-07-06 ENCOUNTER — Telehealth: Payer: Self-pay | Admitting: Cardiology

## 2021-07-06 DIAGNOSIS — R0602 Shortness of breath: Secondary | ICD-10-CM

## 2021-07-06 DIAGNOSIS — I428 Other cardiomyopathies: Secondary | ICD-10-CM

## 2021-07-06 NOTE — Telephone Encounter (Signed)
ICD-10-CM   1. Shortness of breath  R06.02 Home Health    Face-to-face encounter (required for Medicare/Medicaid patients)    For home use only DME 4 wheeled rolling walker with seat    2. Nonischemic cardiomyopathy ([place])  I42.8 Home Health    Face-to-face encounter (required for Medicare/Medicaid patients)    For home use only DME 4 wheeled rolling walker with seat      Orders Placed This Encounter  Procedures   For home use only DME 4 wheeled rolling walker with seat    Order Specific Question:   Patient needs a walker to treat with the following condition    Answer:   COPD (chronic obstructive pulmonary disease) ([place]) [286381]    Order Specific Question:   Patient needs a walker to treat with the following condition    Answer:   Chronic systolic heart failure ([place]) [428.22.ICD-9-CM]   Home Health    Order Specific Question:   To provide the following care/treatments    Answer:   OT    Order Specific Question:   To provide the following care/treatments    Answer:   SLP   Face-to-face encounter (required for Medicare/Medicaid patients)    I [name] certify that this patient is under my care and that I, or a nurse practitioner or physician's assistant working with me, had a face-to-face encounter that meets the physician face-to-face encounter requirements with this patient on 07/06/2021. The encounter with the patient was in whole, or in part for the following medical condition(s) which is the primary reason for home health care (List medical condition):  COPD with centrilobular emphysema. Congestive heart failure, failure to thrive.    Order Specific Question:   The encounter with the patient was in whole, or in part, for the following medical condition, which is the primary reason for home health care    Answer:   Shortness of breath, congestive heart failure, COPD and deconditioning    Order Specific Question:   I certify that, based on my findings, the following services are  medically necessary home health services    Answer:   Physical therapy    Order Specific Question:   Reason for Medically Necessary Home Health Services    Answer:   Skilled Nursing- Change/Decline in Patient Status    Order Specific Question:   My clinical findings support the need for the above services    Answer:   Unable to leave home safely without assistance and/or assistive device    Order Specific Question:   Further, I certify that my clinical findings support that this patient is homebound due to:    Answer:   Ambulates short distances less than 300 feet     [name], MD, [facility] 07/06/2021, [place] PM Office: [phone] Fax: [phone] Pager: [phone]

## 2021-07-08 ENCOUNTER — Telehealth: Payer: Self-pay | Admitting: Primary Care

## 2021-07-08 DIAGNOSIS — R0602 Shortness of breath: Secondary | ICD-10-CM | POA: Diagnosis not present

## 2021-07-09 MED ORDER — TRELEGY ELLIPTA 100-62.5-25 MCG/ACT IN AEPB: 1.0000 | INHALATION_SPRAY | Freq: Every day | RESPIRATORY_TRACT | 2 refills | Status: DC

## 2021-07-09 NOTE — Telephone Encounter (Signed)
Yes he can stop Librarian, academic and resume Trelegy

## 2021-07-09 NOTE — Telephone Encounter (Signed)
Called patient and he states that he has been on Breztri for about 2 weeks now and is not seeing a difference in the inhaler and he is wanting to know if he could go back on his Trelegy inhaler. ? ?Advised patient that I would have to message Beth to see if this was ok and to not start the Trelegy inhaler until we hear a response back from [place]. Patient verbalized understanding. ? ?Beth please advise  ?

## 2021-07-09 NOTE — Telephone Encounter (Signed)
Called patient and let him know to discontinue the [facility] and to go back to Trelegy 100, 1 puff daily. Patient verbalized understanding. Nothing further  ?

## 2021-07-23 ENCOUNTER — Ambulatory Visit: Payer: Medicare HMO | Admitting: Pulmonary Disease

## 2021-07-23 ENCOUNTER — Encounter: Payer: Self-pay | Admitting: Pulmonary Disease

## 2021-07-23 ENCOUNTER — Other Ambulatory Visit: Payer: Self-pay

## 2021-07-23 VITALS — BP 124/62 | HR 73 | Temp 98.0°F | Ht 71.0 in | Wt 159.4 lb

## 2021-07-23 DIAGNOSIS — R0602 Shortness of breath: Secondary | ICD-10-CM

## 2021-07-23 NOTE — Progress Notes (Signed)
? ?      ?Mario Proctor    [id number]    10-28-1940 ? ?Primary Care Physician:Mario Proctor, Mario Rinks, MD ? ?Referring Physician: Tamsen Roers, MD ?[place],  [place] [address] ? ?Chief complaint:  ?Follow up for COPD GOLD A ? ?HPI:  ?Mr. Mario Proctor is a 81 year old with active smoking history, allergies, COPD GOLD A (CAT score 9, no exacerbations), atrial fibrillation.  He has symptoms of dyspnea on exertion for the past several years. He gets short of breath with excessive physical activity but is able to carry out his normal daily activities. He is able to climb a flight of stairs, walk to his mailbox. 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 He has chronic cough with minimal sputum production. No wheezing, hemoptysis, fevers, chills. He had been on albuterol many years ago and is currently not using any inhaler medication.  ? ?He was evaluated by Dr. Einar Proctor for frequent PVCs. He was unable to complete the treadmill stress test due to marked dyspnea and PVCs. An echocardiogram and a pharmacologic stress test has been done. Cardiac cath shows nonischemic cardiomyopathy. Started on Xarelto by Dr. Einar Proctor for new onset atrial fibrillation. ? ?Pets: Cats, dog. No birds, exotic pets, farm animals ?Occupation: Worked as a Geophysicist/field seismologist for [employer]. ?Exposures: Has some exposure to lead in his line of work. Denies exposure to asbestos, dust, etc., ?Smoking history: 54-pack-year smoking history. Continues to smoke 1 pack per day ? ?Interim History: ?Back in my clinic after a gap of 2 years.  In the interim he has seen nurse practitioners.  Trelegy changed to breztri at last visit but he went back on Trelegy as he felt that the former inhaler made his breathing worse. ? ?He was hospitalized in December 2022 for acute respiratory failure secondary to COVID-19, CHF exacerbation.  Treated with IV Lasix, short course of steroids and remdesivir.  He is recovered well from this hospitalization ? ?Outpatient  Encounter Medications as of 07/23/2021  ?Medication Sig  ? albuterol (ACCUNEB) 1.25 MG/3ML nebulizer solution Take 1 ampule by nebulization every 4 (four) hours as needed for wheezing or shortness of breath.  ? albuterol (VENTOLIN HFA) 108 (90 Base) MCG/ACT inhaler INHALE 2 PUFF BY MOUTH EVERY 6 HOURS AS NEEDED FOR WHEEZE OR SHORTNESS OF BREATH (Patient taking differently: Inhale 2 puffs into the lungs every 6 (six) hours as needed for shortness of breath or wheezing.)  ? amiodarone (PACERONE) 100 MG tablet Take 1 tablet (100 mg total) by mouth daily.  ? diphenhydrAMINE (BENADRYL) 25 MG tablet Take 25 mg by mouth every 6 (six) hours as needed.  ? ENTRESTO 97-103 MG TAKE 1 TABLET BY MOUTH EVERY DAY (Patient taking differently: Take 1 tablet by mouth daily.)  ? FARXIGA 10 MG TABS tablet TAKE 1 TABLET BY MOUTH DAILY BEFORE BREAKFAST.  ? Fluticasone-Umeclidin-Vilant (TRELEGY ELLIPTA) 100-62.5-25 MCG/ACT AEPB Inhale 1 puff into the lungs daily.  ? metoprolol succinate (TOPROL-XL) 25 MG 24 hr tablet Take 1 tablet (25 mg total) by mouth daily.  ? rosuvastatin (CRESTOR) 5 MG tablet TAKE 1 TABLET BY MOUTH EVERY DAY (Patient taking differently: Take by mouth daily.)  ? spironolactone (ALDACTONE) 25 MG tablet Take 0.5 tablets (12.5 mg total) by mouth daily.  ? triamcinolone cream (KENALOG) 0.1 % Apply 1 application topically 2 (two) times daily.  ? XARELTO 20 MG TABS tablet TAKE 1 TABLET BY MOUTH EVERY DAY WITH SUPPER (Patient taking differently: Take 20 mg  by mouth daily with supper.)  ? Spacer/Aero-Holding Dorise Bullion Use with inhaler (Patient not taking: Reported on 07/23/2021)  ? [DISCONTINUED] doxycycline (VIBRA-TABS) 100 MG tablet Take 1 tablet (100 mg total) by mouth 2 (two) times daily.  ? [DISCONTINUED] predniSONE (DELTASONE) 10 MG tablet 4 tabs for 3 days, then 3 tabs for 3 days, 2 tabs for 3 days, then 1 tab for 3 days, then stop  ? ?Facility-Administered Encounter Medications as of 07/23/2021  ?Medication  ?  ipratropium-albuterol (DUONEB) 0.5-2.5 (3) MG/3ML nebulizer solution 3 mL  ? ?Physical Exam: ?Blood pressure 124/62, pulse 73, temperature 98 ?F (36.7 ?C), temperature source Oral, height 5\' 11"  (1.803 m), weight 159 lb 6.4 oz (72.3 kg), SpO2 99 %. ?Gen:      No acute distress ?HEENT:  EOMI, sclera anicteric ?Neck:     No masses; no thyromegaly ?Lungs:    Clear to auscultation bilaterally; normal respiratory effort ?CV:         Regular rate and rhythm; no murmurs ?Abd:      + bowel sounds; soft, non-tender; no palpable masses, no distension ?Ext:    No edema; adequate peripheral perfusion ?Skin:      Warm and dry; no rash ?Neuro: alert and oriented x 3 ?Psych: normal mood and affect  ? ?Data Reviewed: ?Imaging ?Chest x-ray 10/15/16-hyperinflation, mild scoliosis and lordosis.  ?CT chest 06/25/2021-interval clearing of infiltrates, new pulmonary nodules, increased density of liver ? ?PFTs 01/07/17 ?FVC 3.96 [89%), FEV1 1.86 [58%), F/F 47, TLC 113%, RV/TLC 130%, DLCO 46% ?Moderate obstruction with diffusion impairment, air trapping ? ?Cardiac ?Cardiac cath 11/03/16 ?Findings are consistent with nonischemic dilated cardiomyopathy (EF30-35%), however with inferior akinesis, patient probably had coronary spasm leading to myocardial infarction or probably prior occlusion which is revascularized spontaneously. Continued aggressive risk modification and therapy for severe LV systolic dysfunction is indicated ? ?Treadmill stress stress 07/24/16 ?Suboptimal treadmill stress test due to marked dyspnea, frequent PVC ? ?Labs ?CBC 09/01/2018-WBC 6.7, eos 9%, absolute eosinophil count 603 ? ?Assessment:  ?Follow up for COPD GOLD A, chronic bronchitis ?Post COVID-19 ?Continues on Trelegy ?On supplemental oxygen.  Check if he can qualify for portable consult ? ?Lung nodules ?Follow-up CT in 3 months already ordered ? ?Active smoker ?Quit smoking December 2022 ? ?Health maintenance ?6/12-Pneumovax. ? ?Plan/Recommendations: ?- Trelegy.  albuterol as needed ?- Follow-up CT ? ?Mario Garfinkel MD ?[facility] ?07/23/2021, 9:13 AM ? ?CC: Mario Roers, MD ? ?

## 2021-07-23 NOTE — Patient Instructions (Signed)
I am glad you are stable with your breathing ?Continue the Trelegy inhaler ?We will check if he can use a portable concentrator ?Follow-up in 6 months ?

## 2021-07-24 ENCOUNTER — Ambulatory Visit: Payer: Medicare HMO | Admitting: Student

## 2021-08-05 DIAGNOSIS — R0602 Shortness of breath: Secondary | ICD-10-CM | POA: Diagnosis not present

## 2021-08-07 ENCOUNTER — Other Ambulatory Visit: Payer: Self-pay | Admitting: Student

## 2021-08-07 DIAGNOSIS — I2510 Atherosclerotic heart disease of native coronary artery without angina pectoris: Secondary | ICD-10-CM

## 2021-08-18 NOTE — Progress Notes (Signed)
? ?Primary Physician/Referring:  Tamsen Roers, MD ? ?Patient ID: Mario Proctor, male    DOB: [birth year], 81 y.o.   MRN: [id number] ? ?Chief Complaint  ?Patient presents with  ? Follow-up  ?  MONTH  ? Atrial Fibrillation  ? ?HPI:   ? ?[name]  is a 81 y.o. Caucasian male patient  with nonischemic cardiomyopathy by coronary angiogram on 11/02/2016 revealing ejection fraction of 30-35%, ongoning tobacco use disorder, and permanent A fib. He is asymptomatic in regards to his A fib and is now on anticoagulation.  Echocardiogram 03/2020 revealed significantly decreased LVEF at 25%.  Patient was therefore started on amiodarone and underwent successful cardioversion 07/30/2020. ? ?Patient was admitted 05/03/2021 - 05/06/2021 with COVID-19 pneumonia and acute on chronic systolic heart failure.  Patient was treated with steroids and remdesivir and diuresed with IV Lasix, dyspnea significantly improved and patient was sent home with supplemental oxygen.  Notably echocardiogram during hospitalization showed mild improvement of LVEF from 35-40% to 40 5-50%.  ? ?Patient now presents for 32-month follow-up.  Following last office visit patient called with concerns of dizziness, therefore stopped Imdur and reduce spironolactone from 25 mg to 12.5 mg daily.  Dizziness and dyspnea have significantly improved since last office visit and he continues to follow closely with pulmonology.  He has had no known recurrence of atrial fibrillation. Denies chest pain, palpitations, dyspnea, leg swelling, orthopnea, PND.  He continues to tolerate anticoagulation without bleeding diathesis. ? ?Past Medical History:  ?Diagnosis Date  ? A-fib ([place])   ? Allergy   ? CHF (congestive heart failure) ([place])   ? Dupuytren contracture   ? right sm finger  ? Myocardial infarct [facility]) 2019  ? Myocardial infarction [facility])   ? Small bowel obstruction ([place])   ? ?Past Surgical History:  ?Procedure Laterality Date  ? CARDIOVERSION N/A 07/30/2020  ? Procedure:  CARDIOVERSION;  Surgeon: [name], MD;  Location: [place];  Service: Cardiovascular;  Laterality: N/A;  ? COLON SURGERY    ? bowel blockage  ? DUPUYTREN CONTRACTURE RELEASE Left 2011  ? FASCIECTOMY Right 05/02/2015  ? Procedure: FASCIECTOMY RIGHT SMALL FINGER;  Surgeon: Daryll Brod, MD;  Location: [place];  Service: Orthopedics;  Laterality: Right;  axillary block in preop  ? LEFT HEART CATH AND CORONARY ANGIOGRAPHY N/A 11/03/2016  ? Procedure: Left Heart Cath and Coronary Angiography;  Surgeon: [name], MD;  Location: [place] CV LAB;  Service: Cardiovascular;  Laterality: N/A;  ? TONSILLECTOMY    ? TYMPANOPLASTY Right   ? ?Family History  ?Problem Relation Age of Onset  ? Cancer Brother   ?     "in heart and lungs"   ?  ?Social History  ? ?Tobacco Use  ? Smoking status: Former  ?  Packs/day: 0.50  ?  Years: 50.00  ?  Pack years: 25.00  ?  Types: Cigarettes  ?  Quit date: 05/02/2021  ?  Years since quitting: 0.2  ?  Passive exposure: Past  ? Smokeless tobacco: Never  ? Tobacco comments:  ?  currently smoking .5ppd as of 05/14/20  ?Substance Use Topics  ? Alcohol use: Yes  ?  Alcohol/week: 6.0 standard drinks  ?  Types: 6 Cans of beer per week  ?  Comment: social  ? ?Marital Status: Married  ?ROS  ?Review of Systems  ?Cardiovascular:  Positive for dyspnea on exertion (improved). Negative for chest pain, claudication, leg swelling, near-syncope, orthopnea, palpitations, paroxysmal nocturnal dyspnea and syncope.  ?  Respiratory:  Positive for cough (chronic, stable).   ?Neurological:  Negative for dizziness (resolved).  ?Objective  ?Blood pressure 123/72, pulse 60, temperature 97.7 ?F (36.5 ?C), temperature source Temporal, resp. rate 17, height 5\' 11"  (1.803 m), weight 160 lb 12.8 oz (72.9 kg), SpO2 96 %.  ? ?  08/19/2021  ?  8:36 AM 07/23/2021  ?  9:00 AM 06/03/2021  ?  9:05 AM  ?Vitals with BMI  ?Height 5\' 11"  5\' 11"  5\' 11"   ?Weight 160 lbs 13 oz 159 lbs 6 oz 152 lbs 13 oz  ?BMI 22.44 22.24  21.32  ?Systolic 097 353 299  ?Diastolic 72 62 64  ?Pulse 60 73 64  ?  ? Physical Exam ?Vitals reviewed.  ?Constitutional:   ?   Appearance: He is well-developed.  ?Neck:  ?   Thyroid: No thyromegaly.  ?   Vascular: No JVD.  ?Cardiovascular:  ?   Rate and Rhythm: Normal rate and regular rhythm.  ?   Pulses: Intact distal pulses.     ?     Carotid pulses are 2+ on the right side and 2+ on the left side. ?     Radial pulses are 2+ on the right side and 2+ on the left side.  ?     Femoral pulses are 2+ on the right side and 2+ on the left side. ?     Popliteal pulses are 2+ on the right side and 2+ on the left side.  ?     Dorsalis pedis pulses are 1+ on the right side and 1+ on the left side.  ?     Posterior tibial pulses are 1+ on the right side and 1+ on the left side.  ?   Heart sounds: Heart sounds are distant. No murmur heard. ?  No gallop.  ?   Comments: No JVD.  ?Pulmonary:  ?   Effort: Pulmonary effort is normal. No accessory muscle usage or respiratory distress.  ?   Breath sounds: Rhonchi present. No wheezing.  ?Musculoskeletal:  ?   Right lower leg: No edema.  ?   Left lower leg: No edema.  ?Physical exam unchanged compared to previous office visit. ? ?Laboratory examination:  ? ?Recent Labs  ?  05/04/21 ?2426 05/05/21 ?0900 05/06/21 ?0607 05/14/21 ?1019 05/26/21 ?1036 06/03/21 ?0944  ?NA 135 132* 135 136 131* 136  ?K 4.0 4.3 3.8 4.4 4.8 4.7  ?CL 96* 91* 95* 99 96 97  ?CO2 29 31 34* 26 29 34*  ?GLUCOSE 176* 211* 80 88 99 84  ?BUN 30* 39* 33* 16 16 33*  ?CREATININE 1.53* 1.61* 1.38* 1.30* 1.35 1.60*  ?CALCIUM 8.5* 8.3* 7.9* 8.0* 8.2* 9.0  ?GFRNONAA 46* 43* 52*  --   --   --   ? ?CrCl cannot be calculated (Patient's most recent lab result is older than the maximum 21 days allowed.).  ? ?  Latest Ref Rng & Units 06/03/2021  ?  9:44 AM 05/26/2021  ? 10:36 AM 05/14/2021  ? 10:19 AM  ?CMP  ?Glucose 70 - 99 mg/dL 84   99   88    ?BUN 6 - 23 mg/dL 33   16   16    ?Creatinine 0.40 - 1.50 mg/dL 1.60   1.35   1.30     ?Sodium 135 - 145 mEq/L 136   131   136    ?Potassium 3.5 - 5.1 mEq/L 4.7   4.8   4.4    ?  Chloride 96 - 112 mEq/L 97   96   99    ?CO2 19 - 32 mEq/L 34   29   26    ?Calcium 8.4 - 10.5 mg/dL 9.0   8.2   8.0    ?Total Protein 6.0 - 8.5 g/dL   5.9    ?Total Bilirubin 0.0 - 1.2 mg/dL   0.6    ?Alkaline Phos 44 - 121 IU/L   99    ?AST 0 - 40 IU/L   23    ?ALT 0 - 44 IU/L   33    ? ? ?  Latest Ref Rng & Units 05/26/2021  ? 10:36 AM 05/14/2021  ? 10:19 AM 05/06/2021  ?  6:07 AM  ?CBC  ?WBC 4.0 - 10.5 K/uL 5.0   8.7   14.8    ?Hemoglobin 13.0 - 17.0 g/dL 13.3   12.7   13.5    ?Hematocrit 39.0 - 52.0 % 40.4   38.3   41.4    ?Platelets 150.0 - 400.0 K/uL 416.0   223   225    ? ?Lipid Panel  ?   ?Component Value Date/Time  ? CHOL 139 06/12/2020 0947  ? TRIG 61 06/12/2020 0947  ? HDL 66 06/12/2020 0947  ? [place] 60 06/12/2020 0947  ? ?External labs: ?None ? ?Allergies  ?No Known Allergies  ? ?Medications Prior to Visit:  ? ?Outpatient Medications Prior to Visit  ?Medication Sig Dispense Refill  ? albuterol (ACCUNEB) 1.25 MG/3ML nebulizer solution Take 1 ampule by nebulization every 4 (four) hours as needed for wheezing or shortness of breath.    ? albuterol (VENTOLIN HFA) 108 (90 Base) MCG/ACT inhaler INHALE 2 PUFF BY MOUTH EVERY 6 HOURS AS NEEDED FOR WHEEZE OR SHORTNESS OF BREATH (Patient taking differently: Inhale 2 puffs into the lungs every 6 (six) hours as needed for shortness of breath or wheezing.) 18 each 5  ? amiodarone (PACERONE) 100 MG tablet Take 1 tablet (100 mg total) by mouth daily. 90 tablet 3  ? diphenhydrAMINE (BENADRYL) 25 MG tablet Take 25 mg by mouth every 6 (six) hours as needed.    ? ENTRESTO 97-103 MG TAKE 1 TABLET BY MOUTH EVERY DAY (Patient taking differently: Take 1 tablet by mouth daily.) 60 tablet 6  ? FARXIGA 10 MG TABS tablet TAKE 1 TABLET BY MOUTH DAILY BEFORE BREAKFAST. 30 tablet 3  ? Fluticasone-Umeclidin-Vilant (TRELEGY ELLIPTA) 100-62.5-25 MCG/ACT AEPB Inhale 1 puff into the lungs daily. 28  each 2  ? metoprolol succinate (TOPROL-XL) 25 MG 24 hr tablet Take 1 tablet (25 mg total) by mouth daily. 90 tablet 3  ? rosuvastatin (CRESTOR) 5 MG tablet TAKE 1 TABLET BY MOUTH EVERY DAY 90 tablet 3  ? [place]

## 2021-08-19 ENCOUNTER — Encounter: Payer: Self-pay | Admitting: Student

## 2021-08-19 ENCOUNTER — Ambulatory Visit: Payer: Medicare HMO | Admitting: Student

## 2021-08-19 VITALS — BP 123/72 | HR 60 | Temp 97.7°F | Resp 17 | Ht 71.0 in | Wt 160.8 lb

## 2021-08-19 DIAGNOSIS — I5042 Chronic combined systolic (congestive) and diastolic (congestive) heart failure: Secondary | ICD-10-CM | POA: Diagnosis not present

## 2021-08-19 DIAGNOSIS — I480 Paroxysmal atrial fibrillation: Secondary | ICD-10-CM

## 2021-08-19 DIAGNOSIS — I951 Orthostatic hypotension: Secondary | ICD-10-CM | POA: Diagnosis not present

## 2021-08-20 ENCOUNTER — Ambulatory Visit: Payer: Medicare HMO | Admitting: Student

## 2021-08-27 ENCOUNTER — Telehealth: Payer: Self-pay | Admitting: Acute Care

## 2021-08-28 NOTE — Telephone Encounter (Signed)
Please advise 

## 2021-08-28 NOTE — Telephone Encounter (Signed)
See the note from [place].  ?

## 2021-08-29 DIAGNOSIS — I5042 Chronic combined systolic (congestive) and diastolic (congestive) heart failure: Secondary | ICD-10-CM | POA: Diagnosis not present

## 2021-08-29 NOTE — Telephone Encounter (Signed)
I have scheduled this LCS CT ?

## 2021-08-29 NOTE — Telephone Encounter (Signed)
Read this message wrong I have the reg ct chest wo scheduled/cb ?

## 2021-08-30 LAB — BASIC METABOLIC PANEL
BUN/Creatinine Ratio: 14 (ref 10–24)
BUN: 29 mg/dL — ABNORMAL HIGH (ref 8–27)
CO2: 21 mmol/L (ref 20–29)
Calcium: 8.7 mg/dL (ref 8.6–10.2)
Chloride: 104 mmol/L (ref 96–106)
Creatinine, Ser: 2.03 mg/dL — ABNORMAL HIGH (ref 0.76–1.27)
Glucose: 73 mg/dL (ref 70–99)
Potassium: 5.4 mmol/L — ABNORMAL HIGH (ref 3.5–5.2)
Sodium: 138 mmol/L (ref 134–144)
eGFR: 33 mL/min/{1.73_m2} — ABNORMAL LOW (ref >59)

## 2021-08-30 LAB — PRO B NATRIURETIC PEPTIDE: NT-Pro BNP: 896 pg/mL — ABNORMAL HIGH (ref 0–486)

## 2021-09-01 ENCOUNTER — Other Ambulatory Visit: Payer: Self-pay

## 2021-09-01 DIAGNOSIS — R911 Solitary pulmonary nodule: Secondary | ICD-10-CM

## 2021-09-01 NOTE — Addendum Note (Signed)
Addended by: Rosezena Sensor on: 09/01/2021 09:58 AM ? ? Modules accepted: Orders ? ?

## 2021-09-04 ENCOUNTER — Telehealth: Payer: Self-pay | Admitting: Pulmonary Disease

## 2021-09-04 ENCOUNTER — Other Ambulatory Visit: Payer: Self-pay | Admitting: Pulmonary Disease

## 2021-09-05 DIAGNOSIS — R0602 Shortness of breath: Secondary | ICD-10-CM | POA: Diagnosis not present

## 2021-09-05 NOTE — Progress Notes (Signed)
Called and spoke to pt, pt voiced understanding. Pt wants to know if dehydration can cause the decrease in his kidney function. Please advise.

## 2021-09-05 NOTE — Telephone Encounter (Signed)
Pt states [employer] has not contacted him about an appointment to walk to qualify for POC. Pt also states that he does not use O2 other then at night which he hooks up to the concentrator for.   ?

## 2021-09-09 ENCOUNTER — Other Ambulatory Visit: Payer: Self-pay | Admitting: Student

## 2021-09-09 NOTE — Progress Notes (Signed)
Yes it can, please advise him to stay well hydrated ?

## 2021-09-11 ENCOUNTER — Other Ambulatory Visit: Payer: Self-pay

## 2021-09-11 NOTE — Progress Notes (Signed)
Called and spoke to pt, pt voiced understanding.

## 2021-09-17 DIAGNOSIS — I5042 Chronic combined systolic (congestive) and diastolic (congestive) heart failure: Secondary | ICD-10-CM | POA: Diagnosis not present

## 2021-09-18 LAB — BASIC METABOLIC PANEL
BUN/Creatinine Ratio: 12 (ref 10–24)
BUN: 29 mg/dL — ABNORMAL HIGH (ref 8–27)
CO2: 20 mmol/L (ref 20–29)
Calcium: 8.9 mg/dL (ref 8.6–10.2)
Chloride: 100 mmol/L (ref 96–106)
Creatinine, Ser: 2.43 mg/dL — ABNORMAL HIGH (ref 0.76–1.27)
Glucose: 100 mg/dL — ABNORMAL HIGH (ref 70–99)
Potassium: 6 mmol/L — ABNORMAL HIGH (ref 3.5–5.2)
Sodium: 136 mmol/L (ref 134–144)
eGFR: 26 mL/min/{1.73_m2} — ABNORMAL LOW (ref >59)

## 2021-09-22 ENCOUNTER — Other Ambulatory Visit: Payer: Self-pay | Admitting: Student

## 2021-09-23 ENCOUNTER — Ambulatory Visit: Payer: Medicare HMO | Admitting: Student

## 2021-09-23 NOTE — Progress Notes (Signed)
Called and spoke to patient he is aware and MAR has been updated

## 2021-09-25 NOTE — Telephone Encounter (Signed)
Called and spoke with pt to see if he ever heard from [employer] about having walk performed and he said he did not. Community message has been sent to [employer] to check on this. Will await response.

## 2021-10-01 ENCOUNTER — Telehealth: Payer: Self-pay

## 2021-10-01 DIAGNOSIS — N289 Disorder of kidney and ureter, unspecified: Secondary | ICD-10-CM

## 2021-10-01 DIAGNOSIS — I428 Other cardiomyopathies: Secondary | ICD-10-CM

## 2021-10-02 NOTE — Telephone Encounter (Signed)
Called and spoke to patient.

## 2021-10-02 NOTE — Telephone Encounter (Signed)
Referral sent 

## 2021-10-05 DIAGNOSIS — R0602 Shortness of breath: Secondary | ICD-10-CM | POA: Diagnosis not present

## 2021-10-06 ENCOUNTER — Other Ambulatory Visit: Payer: Self-pay | Admitting: Primary Care

## 2021-10-14 ENCOUNTER — Ambulatory Visit
Admission: RE | Admit: 2021-10-14 | Discharge: 2021-10-14 | Disposition: A | Discharge status: Home / Self Care | Payer: Medicare HMO | Source: Ambulatory Visit | Attending: Family Medicine | Admitting: Family Medicine

## 2021-10-14 DIAGNOSIS — R911 Solitary pulmonary nodule: Secondary | ICD-10-CM | POA: Diagnosis not present

## 2021-10-14 DIAGNOSIS — R918 Other nonspecific abnormal finding of lung field: Secondary | ICD-10-CM | POA: Diagnosis not present

## 2021-10-14 DIAGNOSIS — J439 Emphysema, unspecified: Secondary | ICD-10-CM | POA: Diagnosis not present

## 2021-11-05 DIAGNOSIS — R0602 Shortness of breath: Secondary | ICD-10-CM | POA: Diagnosis not present

## 2021-11-05 NOTE — Telephone Encounter (Signed)
Noted     Closing encounter

## 2021-11-12 DIAGNOSIS — R52 Pain, unspecified: Secondary | ICD-10-CM | POA: Diagnosis not present

## 2021-11-12 DIAGNOSIS — T148XXA Other injury of unspecified body region, initial encounter: Secondary | ICD-10-CM | POA: Diagnosis not present

## 2021-11-20 ENCOUNTER — Other Ambulatory Visit: Payer: Self-pay | Admitting: *Deleted

## 2021-11-20 DIAGNOSIS — I4891 Unspecified atrial fibrillation: Secondary | ICD-10-CM | POA: Diagnosis not present

## 2021-11-20 DIAGNOSIS — J449 Chronic obstructive pulmonary disease, unspecified: Secondary | ICD-10-CM | POA: Diagnosis not present

## 2021-11-20 DIAGNOSIS — N179 Acute kidney failure, unspecified: Secondary | ICD-10-CM | POA: Diagnosis not present

## 2021-11-20 DIAGNOSIS — I5022 Chronic systolic (congestive) heart failure: Secondary | ICD-10-CM | POA: Diagnosis not present

## 2021-11-20 DIAGNOSIS — N184 Chronic kidney disease, stage 4 (severe): Secondary | ICD-10-CM | POA: Diagnosis not present

## 2021-11-20 DIAGNOSIS — R911 Solitary pulmonary nodule: Secondary | ICD-10-CM

## 2021-11-24 ENCOUNTER — Other Ambulatory Visit: Payer: Self-pay | Admitting: Nephrology

## 2021-11-24 DIAGNOSIS — N179 Acute kidney failure, unspecified: Secondary | ICD-10-CM

## 2021-11-26 ENCOUNTER — Ambulatory Visit
Admission: RE | Admit: 2021-11-26 | Discharge: 2021-11-26 | Disposition: A | Discharge status: Home / Self Care | Payer: Medicare HMO | Source: Ambulatory Visit | Attending: Nephrology | Admitting: Nephrology

## 2021-11-26 DIAGNOSIS — N179 Acute kidney failure, unspecified: Secondary | ICD-10-CM

## 2021-11-26 DIAGNOSIS — N281 Cyst of kidney, acquired: Secondary | ICD-10-CM | POA: Diagnosis not present

## 2021-12-01 NOTE — Progress Notes (Signed)
External labs 11/20/2021: BUN 17, creatinine 1.44, GFR 49, sodium 135, potassium 5.2

## 2021-12-05 DIAGNOSIS — R0602 Shortness of breath: Secondary | ICD-10-CM | POA: Diagnosis not present

## 2022-01-05 ENCOUNTER — Other Ambulatory Visit: Payer: Self-pay | Admitting: Primary Care

## 2022-01-05 DIAGNOSIS — R0602 Shortness of breath: Secondary | ICD-10-CM | POA: Diagnosis not present

## 2022-01-07 ENCOUNTER — Telehealth: Payer: Self-pay | Admitting: Pulmonary Disease

## 2022-01-08 NOTE — Telephone Encounter (Signed)
Rx for pt's Trelegy was sent to the pharmacy for pt 8/29. Called pt's pharmacy to see if pt was able to pick up Rx and was told that pt did pick Rx up. Nothing further needed.

## 2022-01-21 ENCOUNTER — Other Ambulatory Visit: Payer: Self-pay | Admitting: Pulmonary Disease

## 2022-01-21 ENCOUNTER — Ambulatory Visit
Admission: RE | Admit: 2022-01-21 | Discharge: 2022-01-21 | Disposition: A | Discharge status: Home / Self Care | Payer: Medicare HMO | Source: Ambulatory Visit | Attending: Pulmonary Disease | Admitting: Pulmonary Disease

## 2022-01-21 DIAGNOSIS — R918 Other nonspecific abnormal finding of lung field: Secondary | ICD-10-CM | POA: Diagnosis not present

## 2022-01-21 DIAGNOSIS — R911 Solitary pulmonary nodule: Secondary | ICD-10-CM

## 2022-01-21 DIAGNOSIS — I700 Atherosclerosis of aorta: Secondary | ICD-10-CM | POA: Diagnosis not present

## 2022-01-21 DIAGNOSIS — J439 Emphysema, unspecified: Secondary | ICD-10-CM | POA: Diagnosis not present

## 2022-01-22 NOTE — Progress Notes (Unsigned)
Synopsis: Referred for dyspnea by Tamsen Roers, MD  Subjective:   PATIENT ID: Mario Proctor GENDER: male DOB: [birth year], MRN: [id number]  No chief complaint on file.  81yM with history of AF, allergy, CHF, COPD, smoking, covid-19 04/2021  Otherwise pertinent review of systems is negative.  Past Medical History:  Diagnosis Date   A-fib [facility])    Allergy    CHF (congestive heart failure) (HCC)    Dupuytren contracture    right sm finger   Myocardial infarct ([place]) 2019   Myocardial infarction ([place])    Small bowel obstruction (HCC)      Family History  Problem Relation Age of Onset   Cancer Brother        "in heart and lungs"      Past Surgical History:  Procedure Laterality Date   CARDIOVERSION N/A 07/30/2020   Procedure: CARDIOVERSION;  Surgeon: [name], MD;  Location: [place];  Service: Cardiovascular;  Laterality: N/A;   COLON SURGERY     bowel blockage   DUPUYTREN CONTRACTURE RELEASE Left 2011   FASCIECTOMY Right 05/02/2015   Procedure: FASCIECTOMY RIGHT SMALL FINGER;  Surgeon: Daryll Brod, MD;  Location: [place];  Service: Orthopedics;  Laterality: Right;  axillary block in preop   LEFT HEART CATH AND CORONARY ANGIOGRAPHY N/A 11/03/2016   Procedure: Left Heart Cath and Coronary Angiography;  Surgeon: [name], MD;  Location: [place] [facility];  Service: Cardiovascular;  Laterality: N/A;   TONSILLECTOMY     TYMPANOPLASTY Right     Social History   Socioeconomic History   Marital status: Married    Spouse name: Not on file   Number of children: 3   Years of education: Not on file   Highest education level: Not on file  Occupational History   Not on file  Tobacco Use   Smoking status: Former    Packs/day: 0.50    Years: 50.00    Total pack years: 25.00    Types: Cigarettes    Quit date: 05/02/2021    Years since quitting: 0.7    Passive exposure: Past   Smokeless tobacco: Never   Tobacco comments:    currently smoking  .5ppd as of 05/14/20  Vaping Use   Vaping Use: Never used  Substance and Sexual Activity   Alcohol use: Yes    Alcohol/week: 6.0 standard drinks of alcohol    Types: 6 Cans of beer per week    Comment: social   Drug use: No   Sexual activity: Not on file  Other Topics Concern   Not on file  Social History Narrative   ** Merged History Encounter **       Social Determinants of Health   Financial Resource Strain: Not on file  Food Insecurity: Not on file  Transportation Needs: Not on file  Physical Activity: Not on file  Stress: Not on file  Social Connections: Not on file  Intimate Partner Violence: Not on file     No Known Allergies   Outpatient Medications Prior to Visit  Medication Sig Dispense Refill   albuterol (ACCUNEB) 1.25 MG/3ML nebulizer solution Take 1 ampule by nebulization every 4 (four) hours as needed for wheezing or shortness of breath.     albuterol (VENTOLIN HFA) 108 (90 Base) MCG/ACT inhaler Inhale 2 puffs into the lungs every 6 (six) hours as needed for wheezing or shortness of breath. 18 each 5   amiodarone (PACERONE) 100 MG tablet Take 1 tablet (  100 mg total) by mouth daily. 90 tablet 3   diphenhydrAMINE (BENADRYL) 25 MG tablet Take 25 mg by mouth every 6 (six) hours as needed.     Fluticasone-Umeclidin-Vilant (TRELEGY ELLIPTA) 100-62.5-25 MCG/ACT AEPB INHALE 1 PUFF BY MOUTH EVERY DAY 60 each 5   metoprolol succinate (TOPROL-XL) 25 MG 24 hr tablet Take 1 tablet (25 mg total) by mouth daily. 90 tablet 3   rosuvastatin (CRESTOR) 5 MG tablet TAKE 1 TABLET BY MOUTH EVERY DAY 90 tablet 3   Spacer/Aero-Holding Chambers DEVI Use with inhaler 1 each 2   triamcinolone cream (KENALOG) 0.1 % Apply 1 application topically 2 (two) times daily.     XARELTO 20 MG TABS tablet TAKE 1 TABLET BY MOUTH EVERY DAY WITH SUPPER 90 tablet 3   Facility-Administered Medications Prior to Visit  Medication Dose Route Frequency Provider Last Rate Last Admin   ipratropium-albuterol  (DUONEB) 0.5-2.5 (3) MG/3ML nebulizer solution 3 mL  3 mL Nebulization Once Martyn Ehrich, NP           Objective:   Physical Exam:  General appearance: 81 y.o., male, NAD, conversant  Eyes: anicteric sclerae; PERRL, tracking appropriately HENT: NCAT; MMM Neck: Trachea midline; no lymphadenopathy, no JVD Lungs: CTAB, no crackles, no wheeze, with normal respiratory effort CV: RRR, no murmur  Abdomen: Soft, non-tender; non-distended, BS present  Extremities: No peripheral edema, warm Skin: Normal turgor and texture; no rash Psych: Appropriate affect Neuro: Alert and oriented to person and place, no focal deficit     There were no vitals filed for this visit.   on *** LPM *** RA BMI Readings from Last 3 Encounters:  08/19/21 22.43 kg/m  07/23/21 22.23 kg/m  06/03/21 21.31 kg/m   Wt Readings from Last 3 Encounters:  08/19/21 160 lb 12.8 oz (72.9 kg)  07/23/21 159 lb 6.4 oz (72.3 kg)  06/03/21 152 lb 12.8 oz (69.3 kg)     CBC    Component Value Date/Time   WBC 5.0 05/26/2021 1036   RBC 4.42 05/26/2021 1036   HGB 13.3 05/26/2021 1036   HGB 12.7 (L) 05/14/2021 1019   HCT 40.4 05/26/2021 1036   HCT 38.3 05/14/2021 1019   PLT 416.0 (H) 05/26/2021 1036   PLT 223 05/14/2021 1019   MCV 91.4 05/26/2021 1036   MCV 91 05/14/2021 1019   MCH 30.3 05/14/2021 1019   MCH 31.4 05/06/2021 0607   MCHC 32.9 05/26/2021 1036   RDW 13.6 05/26/2021 1036   RDW 11.5 (L) 05/14/2021 1019   LYMPHSABS 1.2 05/26/2021 1036   LYMPHSABS 2.0 09/01/2018 0803   MONOABS 0.7 05/26/2021 1036   EOSABS 0.1 05/26/2021 1036   EOSABS 0.6 (H) 09/01/2018 0803   BASOSABS 0.0 05/26/2021 1036   BASOSABS 0.1 09/01/2018 0803    Eos 600 in 2020  Chest Imaging: CT Chest 01/22/22 reviewed by me with 2cm ggo LUL new since 10/15/21 CT Chest  Pulmonary Functions Testing Results:    Latest Ref Rng & Units 01/07/2017   12:37 PM  PFT Results  FVC-Pre L 3.63   FVC-Predicted Pre % 82   FVC-Post L 3.96    FVC-Predicted Post % 89   Pre FEV1/FVC % % 48   Post FEV1/FCV % % 47   FEV1-Pre L 1.74   FEV1-Predicted Pre % 54   FEV1-Post L 1.86   DLCO uncorrected ml/min/mmHg 15.81   DLCO UNC% % 46   DLCO corrected ml/min/mmHg 17.71   DLCO COR %Predicted % 52  DLVA Predicted % 55   TLC L 8.24   TLC % Predicted % 113   RV % Predicted % 152      Echocardiogram:   TTE 04/2021:  1. Left ventricular ejection fraction, by estimation, is 45 to 50%. The  left ventricle has mildly decreased function. The left ventricle  demonstrates global hypokinesis. There is mild left ventricular  hypertrophy. Left ventricular diastolic parameters  were normal.   2. Right ventricular systolic function is normal. The right ventricular  size is normal.   3. The mitral valve is normal in structure. Trivial mitral valve  regurgitation. No evidence of mitral stenosis.   4. The aortic valve is normal in structure. Aortic valve regurgitation is  not visualized. No aortic stenosis is present.   5. Compared to previous outpatient study in 10/2020, LVEF is marginally  improved from 35-40%.   Heart Catheterization: ***    Assessment & Plan:    Plan:      Maryjane Hurter, MD [facility] 01/22/2022 5:37 PM

## 2022-01-23 ENCOUNTER — Ambulatory Visit (INDEPENDENT_AMBULATORY_CARE_PROVIDER_SITE_OTHER): Payer: Medicare HMO

## 2022-01-23 ENCOUNTER — Ambulatory Visit: Payer: Medicare HMO | Admitting: Student

## 2022-01-23 ENCOUNTER — Encounter: Payer: Self-pay | Admitting: Student

## 2022-01-23 VITALS — BP 158/88 | HR 63 | Ht 71.0 in | Wt 168.0 lb

## 2022-01-23 DIAGNOSIS — R911 Solitary pulmonary nodule: Secondary | ICD-10-CM | POA: Diagnosis not present

## 2022-01-23 DIAGNOSIS — J441 Chronic obstructive pulmonary disease with (acute) exacerbation: Secondary | ICD-10-CM | POA: Diagnosis not present

## 2022-01-23 DIAGNOSIS — R0600 Dyspnea, unspecified: Secondary | ICD-10-CM

## 2022-01-23 DIAGNOSIS — J439 Emphysema, unspecified: Secondary | ICD-10-CM | POA: Diagnosis not present

## 2022-01-23 LAB — BASIC METABOLIC PANEL
BUN: 19 mg/dL (ref 6–23)
CO2: 32 mEq/L (ref 19–32)
Calcium: 9 mg/dL (ref 8.4–10.5)
Chloride: 99 mEq/L (ref 96–112)
Creatinine, Ser: 1.43 mg/dL (ref 0.40–1.50)
GFR: 46.4 mL/min — ABNORMAL LOW (ref >60.00)
Glucose, Bld: 95 mg/dL (ref 70–99)
Potassium: 4.7 mEq/L (ref 3.5–5.1)
Sodium: 136 mEq/L (ref 135–145)

## 2022-01-23 MED ORDER — DOXYCYCLINE HYCLATE 100 MG PO TABS: 100.0000 mg | ORAL_TABLET | Freq: Two times a day (BID) | ORAL | 0 refills | Status: DC

## 2022-01-23 MED ORDER — PREDNISONE 10 MG PO TABS: ORAL_TABLET | ORAL | 0 refills | Status: DC

## 2022-01-23 MED ORDER — ALBUTEROL SULFATE 1.25 MG/3ML IN NEBU: 1.0000 | INHALATION_SOLUTION | RESPIRATORY_TRACT | 3 refills | Status: DC | PRN

## 2022-01-23 NOTE — Patient Instructions (Addendum)
-   Labs and x ray today - start doxycycline 100 mg twice daily until gone - start prednisone taper - CT Chest and clinic visit in 3 months - I'll be in touch if it looks like we may need to start a water pill after reviewing labs and x ray

## 2022-01-24 LAB — PRO B NATRIURETIC PEPTIDE: NT-Pro BNP: 4146 pg/mL — ABNORMAL HIGH (ref 0–486)

## 2022-01-28 ENCOUNTER — Encounter: Payer: Self-pay | Admitting: Internal Medicine

## 2022-01-28 ENCOUNTER — Ambulatory Visit: Payer: Medicare HMO | Admitting: Internal Medicine

## 2022-01-28 VITALS — BP 160/75 | HR 67 | Temp 98.2°F | Resp 16 | Ht 71.0 in | Wt 170.0 lb

## 2022-01-28 DIAGNOSIS — I480 Paroxysmal atrial fibrillation: Secondary | ICD-10-CM

## 2022-01-28 DIAGNOSIS — I10 Essential (primary) hypertension: Secondary | ICD-10-CM | POA: Diagnosis not present

## 2022-01-28 DIAGNOSIS — I5042 Chronic combined systolic (congestive) and diastolic (congestive) heart failure: Secondary | ICD-10-CM | POA: Diagnosis not present

## 2022-01-28 MED ORDER — LISINOPRIL 10 MG PO TABS: 10.0000 mg | ORAL_TABLET | Freq: Every day | ORAL | 2 refills | Status: DC

## 2022-01-28 NOTE — Progress Notes (Signed)
Primary Physician/Referring:  Tamsen Roers, MD  Patient ID: Mario Proctor, male    DOB: 06/08/1940, 81 y.o.   MRN: [id number]  No chief complaint on file.  HPI:    Mario Proctor  is a 81 y.o. Caucasian male patient  with nonischemic cardiomyopathy by coronary angiogram on 11/02/2016 revealing ejection fraction of 30-35%, ongoning tobacco use disorder, and A fib. He is on anticoagulation, Amiodarone, and Metoprolol.    Patient was admitted 05/03/2021 - 05/06/2021 with COVID-19 pneumonia and acute on chronic systolic heart failure.  Patient was treated with steroids and remdesivir and diuresed with IV Lasix, dyspnea significantly improved and patient was sent home with supplemental oxygen.  Notably echocardiogram during hospitalization showed mild improvement of LVEF from 35-40% to 40 5-50%.   Patient now presents for 33-monthfollow-up with concerns of elevated blood pressure and ongoing shortness of breath. Blood pressure is elevated at today's visit. He continues to follow closely with pulmonology. He has had no known recurrence of atrial fibrillation. Denies chest pain, palpitations, leg swelling, orthopnea.  He continues to tolerate anticoagulation without bleeding diathesis.  Past Medical History:  Diagnosis Date   A[facility]fib [facility]    Allergy    CHF (congestive heart failure) (HCC)    Dupuytren contracture    right sm finger   [place]   [place]    [facility] ([facility]    Past Surgical History:  Procedure Laterality Date   CARDIOVERSION N/A 07/30/2020   Procedure: CARDIOVERSION;  Surgeon: GAdrian Prows MD;  [place]  Service: Cardiovascular;  Laterality: N/A;   COLON SURGERY     bowel blockage   DUPUYTREN CONTRACTURE RELEASE Left 2011   FASCIECTOMY Right 05/02/2015   Procedure: FASCIECTOMY RIGHT SMALL FINGER;  Surgeon: GDaryll Brod MD;  [place]  Service: Orthopedics;  Laterality: Right;   axillary block in preop   LEFT HEART CATH AND CORONARY ANGIOGRAPHY N/A 11/03/2016   Procedure: Left Heart Cath and Coronary Angiography;  Surgeon: GAdrian Prows MD;  [facility];  Service: Cardiovascular;  Laterality: N/A;   TONSILLECTOMY     TYMPANOPLASTY Right    Family History  Problem Relation Age of Onset   Cancer Brother        "in heart and lungs"     Social History   Tobacco Use   Smoking status: Former    Packs/day: 0.50    Years: 50.00    Total pack years: 25.00    Types: Cigarettes    Quit date: 05/02/2021    Years since quitting: 0.7    Passive exposure: Past   Smokeless tobacco: Never   Tobacco comments:    currently smoking .5ppd as of 05/14/20  Substance Use Topics   Alcohol use: Yes    Alcohol/week: 6.0 standard drinks of alcohol    Types: 6 Cans of beer per week    Comment: social   Marital Status: Married  ROS  Review of Systems  Cardiovascular:  Positive for dyspnea on exertion. Negative for chest pain, claudication, leg swelling, near-syncope, orthopnea, palpitations, paroxysmal nocturnal dyspnea and syncope.  Respiratory:  Positive for cough (chronic, stable) and shortness of breath.   Neurological:  Negative for dizziness.   Objective  Blood pressure (!) 160/75, pulse 67, temperature 98.2 F (36.8 C), temperature source Temporal, resp. rate 16, height _0  (1.803 m), weight 170 lb (77.1 kg), SpO2 94 %.     01/28/2022  9:29 AM 01/23/2022   10:12 AM 08/19/2021    8:36 AM  Vitals with BMI  Height _0  _1  _2   Weight 170 lbs 168 lbs 160 lbs 13 oz  BMI 23.72 07.62 26.33  Systolic 354 562 563  Diastolic 75 88 72  Pulse 67 63 60     Physical Exam Vitals reviewed.  Constitutional:      Appearance: He is well-developed.  Neck:     Thyroid: No thyromegaly.     Vascular: No JVD.  Cardiovascular:     Rate and Rhythm: Normal rate and regular rhythm.     Pulses: Intact distal pulses.          Carotid pulses are 2+ on the  right side and 2+ on the left side.      Radial pulses are 2+ on the right side and 2+ on the left side.       Femoral pulses are 2+ on the right side and 2+ on the left side.      Popliteal pulses are 2+ on the right side and 2+ on the left side.       Dorsalis pedis pulses are 1+ on the right side and 1+ on the left side.       Posterior tibial pulses are 1+ on the right side and 1+ on the left side.     Heart sounds: Heart sounds are distant. No murmur heard.    No gallop.     Comments: No JVD.  Pulmonary:     Effort: Pulmonary effort is normal. No accessory muscle usage or respiratory distress.     Breath sounds: No wheezing or rhonchi.  Musculoskeletal:     Right lower leg: No edema.     Left lower leg: No edema.   Physical exam unchanged compared to previous office visit.  Laboratory examination:   Recent Labs    05/04/21 0816 05/05/21 0900 05/06/21 0607 05/14/21 1019 08/29/21 1042 09/17/21 1110 01/23/22 1039  NA 135 132* 135   < > 138 136 136  K 4.0 4.3 3.8   < > 5.4* 6.0* 4.7  CL 96* 91* 95*   < > 104 100 99  CO2 29 31 34*   < > 21 20 32  GLUCOSE 176* 211* 80   < > 73 100* 95  BUN 30* 39* 33*   < > 29* 29* 19  CREATININE 1.53* 1.61* 1.38*   < > 2.03* 2.43* 1.43  CALCIUM 8.5* 8.3* 7.9*   < > 8.7 8.9 9.0  GFRNONAA 46* 43* 52*  --   --   --   --    < > = values in this interval not displayed.   estimated creatinine clearance is 43.9 mL/min (by C-G formula based on SCr of 1.43 mg/dL).     Latest Ref Rng & Units 01/23/2022   10:39 AM 09/17/2021   11:10 AM 08/29/2021   10:42 AM  CMP  Glucose 70 - 99 mg/dL 95  100  73   BUN 6 - 23 mg/dL _3 Creatinine 0.40 - 1.50 mg/dL 1.43  2.43  2.03   Sodium 135 - 145 mEq/L 136  136  138   Potassium 3.5 - 5.1 mEq/L 4.7  6.0  5.4   Chloride 96 - 112 mEq/L 99  100  104   CO2 19 - 32 mEq/L 32  20  21   Calcium 8.4 - 10.5 mg/dL  9.0  8.9  8.7       Latest Ref Rng & Units 05/26/2021   10:36 AM 05/14/2021   10:19 AM  05/06/2021    6:07 AM  CBC  WBC 4.0 - 10.5 K/uL 5.0  8.7  14.8   Hemoglobin 13.0 - 17.0 g/dL 13.3  12.7  13.5   Hematocrit 39.0 - 52.0 % 40.4  38.3  41.4   Platelets 150.0 - 400.0 K/uL 416.0  223  225    Lipid Panel     Component Value Date/Time   CHOL 139 06/12/2020 0947   TRIG 61 06/12/2020 0947   HDL 66 06/12/2020 0947   LDLCALC 60 06/12/2020 0947   External labs: None  Allergies  No Known Allergies   Medications Prior to Visit:   Outpatient Medications Prior to Visit  Medication Sig Dispense Refill   albuterol (ACCUNEB) 1.25 MG/3ML nebulizer solution Take 3 mLs (1.25 mg total) by nebulization every 4 (four) hours as needed for wheezing or shortness of breath. 75 mL 3   albuterol (VENTOLIN HFA) 108 (90 Base) MCG/ACT inhaler Inhale 2 puffs into the lungs every 6 (six) hours as needed for wheezing or shortness of breath. 18 each 5   amiodarone (PACERONE) 100 MG tablet Take 1 tablet (100 mg total) by mouth daily. 90 tablet 3   diphenhydrAMINE (BENADRYL) 25 MG tablet Take 25 mg by mouth every 6 (six) hours as needed.     doxycycline (VIBRA-TABS) 100 MG tablet Take 1 tablet (100 mg total) by mouth 2 (two) times daily. 14 tablet 0   Fluticasone-Umeclidin-Vilant (TRELEGY ELLIPTA) 100-62.5-25 MCG/ACT AEPB INHALE 1 PUFF BY MOUTH EVERY DAY 60 each 5   metoprolol succinate (TOPROL-XL) 25 MG 24 hr tablet Take 1 tablet (25 mg total) by mouth daily. 90 tablet 3   rosuvastatin (CRESTOR) 5 MG tablet TAKE 1 TABLET BY MOUTH EVERY DAY 90 tablet 3   Spacer/Aero-Holding Chambers DEVI Use with inhaler 1 each 2   triamcinolone cream (KENALOG) 0.1 % Apply 1 application topically 2 (two) times daily.     XARELTO 20 MG TABS tablet TAKE 1 TABLET BY MOUTH EVERY DAY WITH SUPPER 90 tablet 3   predniSONE (DELTASONE) 10 MG tablet Take 4 tabs for 2 days, then 3 tabs for 2 days, 2 tabs for 2 days, then 1 tab for 2 days, then stop. (Patient not taking: Reported on 01/28/2022) 20 tablet 0    Facility-Administered Medications Prior to Visit  Medication Dose Route Frequency Provider Last Rate Last Admin   ipratropium-albuterol (DUONEB) 0.5-2.5 (3) MG/3ML nebulizer solution 3 mL  3 mL Nebulization Once Martyn Ehrich, NP       Final Medications at End of Visit    Current Meds  Medication Sig   albuterol (ACCUNEB) 1.25 MG/3ML nebulizer solution Take 3 mLs (1.25 mg total) by nebulization every 4 (four) hours as needed for wheezing or shortness of breath.   albuterol (VENTOLIN HFA) 108 (90 Base) MCG/ACT inhaler Inhale 2 puffs into the lungs every 6 (six) hours as needed for wheezing or shortness of breath.   amiodarone (PACERONE) 100 MG tablet Take 1 tablet (100 mg total) by mouth daily.   diphenhydrAMINE (BENADRYL) 25 MG tablet Take 25 mg by mouth every 6 (six) hours as needed.   doxycycline (VIBRA-TABS) 100 MG tablet Take 1 tablet (100 mg total) by mouth 2 (two) times daily.   Fluticasone-Umeclidin-Vilant (TRELEGY ELLIPTA) 100-62.5-25 MCG/ACT AEPB INHALE 1 PUFF BY MOUTH EVERY DAY   metoprolol  succinate (TOPROL-XL) 25 MG 24 hr tablet Take 1 tablet (25 mg total) by mouth daily.   rosuvastatin (CRESTOR) 5 MG tablet TAKE 1 TABLET BY MOUTH EVERY DAY   Spacer/Aero-Holding Chambers DEVI Use with inhaler   triamcinolone cream (KENALOG) 0.1 % Apply 1 application topically 2 (two) times daily.   XARELTO 20 MG TABS tablet TAKE 1 TABLET BY MOUTH EVERY DAY WITH SUPPER   Current Facility-Administered Medications for the 01/28/22 encounter (Office Visit) with Floydene Flock, DO  Medication   ipratropium-albuterol (DUONEB) 0.5-2.5 (3) MG/3ML nebulizer solution 3 mL   Radiology:   Low-dose CT chest 08/16/2019: Coronary and aortic atherosclerosis and calcification.  Severe centrilobular emphysema.  2 benign looking nodules.  Benign right upper kidney mass.  Chest x-ray 08/29/2020: Cardiac shadow is within normal limits. Aortic calcifications are seen. The lungs are well aerated bilaterally.  No bony abnormality is seen. IMPRESSION: No active disease.   CT head without contrast 08/29/2020: 1. Advanced chronic ischemic microangiopathy without acute intracranial abnormality. 2. No acute fracture or static subluxation of the cervical spine. 3. Small right frontal scalp hematoma.  Cardiac Studies:   Lexiscan myoview stress test 09/18/2016: 1. The resting electrocardiogram demonstrated normal sinus rhythm, normal resting conduction, no resting arrhythmias and normal rest repolarization.  Stress EKG is non-diagnostic for ischemia as it a pharmacologic stress using Lexiscan. Stress symptoms included dyspnea. Occasional PVC noted. 2. The LV is dilated both at rest and stress images. The LV end diastolic volume was 500XF. SPECT images demonstrate Medium perfusion abnormality of moderate intensity in the basal inferior, mid inferior and apical inferior myocardial wall(s) on the stress images.  The defect remains relatively unchanged between rest and stress images and is a soft tissue attenuation artifact, however scar in this region without ischemia cannot be completely excluded. The left ventricular ejection fraction was calculated or visually estimated to be 25% with global hypokinesis. High risk study.  Abdominal aortic duplex 10/06/2016: Diffuse plaque noted in the proximal, mid and distal aorta.  No AAA observed.  Coronary angiogram 11/03/2016: Severe left ventricular systolic dysfunction. The left ventricular ejection fraction is 30-35% by visual estimate with inferior wall akinesis.There is no mitral valve regurgitation. Normal LVEDP. Normal coronary arteries.  Holter Monitor 48 hours 09/07/2017: Minimum heart rate 38 bpm at 12:13 AM maximum heart rate 154 bpm at 12:59 PM. PVCs consisted of 3600 beats, 1.5% burden. Occasional ventricular and triplets, 194 couplets.. Bigeminy and trigeminy. 2 episodes of 4 beat 3 beat NSVT. Predominant rhythm was atrial fibrillation.   Direct current  cardioversion 07/30/20 : Indication symptomatic A. Fibrillation. Procedure: Using 80 mg of IV Propofol and 60 IV Lidocaine (for reducing venous pain) for achieving deep sedation, synchronized direct current cardioversion performed. Patient was delivered with 150 Joules of electricity X 1 with success to NSR. Patient tolerated the procedure well. No immediate complication noted.   Echocardiogram 05/04/2021:  1. Left ventricular ejection fraction, by estimation, is 45 to 50%. The left ventricle has mildly decreased function. The left ventricle demonstrates global hypokinesis. There is mild left ventricular  hypertrophy. Left ventricular diastolic parameters were normal.   2. Right ventricular systolic function is normal. The right ventricular size is normal.   3. The mitral valve is normal in structure. Trivial mitral valve regurgitation. No evidence of mitral stenosis.   4. The aortic valve is normal in structure. Aortic valve regurgitation is not visualized. No aortic stenosis is present.   5. Compared to previous outpatient study in 10/2020, LVEF is  marginally improved from 35-40%.  EKG:  EKG 01/28/22: Sinus rhythm with first-degree AV block at a rate of 58 bpm.  Left axis, left complete bundle branch block.  Poor R wave progression, cannot exclude anteroseptal infarct old.  LVH. Compared to previous EKG, no significant change  Assessment     ICD-10-CM   1. Paroxysmal atrial fibrillation (HCC)  I48.0 EKG 12-Lead      No orders of the defined types were placed in this encounter.   There are no discontinued medications. This patients CHA2DS2-VASc Score 3 (CHF, A) and yearly risk of stroke 3.2%.    Recommendations:   WINFREY CHILLEMI  is a 81 y.o.  Caucasian male patient  with nonischemic cardiomyopathy by coronary angiogram on 11/02/2016 revealing ejection fraction of 30-35%, ongoning tobacco use disorder, and A fib. He is on anticoagulation, Amiodarone, and Metoprolol.    Patient now  presents for 58-monthfollow-up with concerns of elevated blood pressure and ongoing shortness of breath. He continues to follow closely with pulmonology. He has had no known recurrence of atrial fibrillation. Denies chest pain, palpitations, leg swelling, orthopnea.  He continues to tolerate anticoagulation without bleeding diathesis.  Paroxysmal atrial fibrillation (HCC) EKG shows sinus rhythm today. No recurrence of afib.  Given, worsening SOB and reviewing recent chest CT scan, will stop Amiodarone and place cardiac event monitor. If recurrence of symptomtic and frequent afib would consider another antiarrhythmic therapy such as Tikosyn. If no recurrence or infrequent, asymptomatic episodes of afib will continue with Metoprolol as ordered. Continue Xarelto as ordered.  Chronic combined systolic and diastolic CHF (congestive heart failure) (HCC) No evidence of acute heart failure exacerbation, continue current medications. Echo ordered.  Primary hypertension Start Losartan 245mdaily. He met with pharmacist and will start RPM. If BP remains elevated, would titrate losartan to 5071maily.  Recommend continued daily weights and low-sodium diet.  Follow-up in 6 weeks, sooner if needed.    BriErnst SpellP 01/28/2022, 9:57 AM Office: [phone]

## 2022-02-04 ENCOUNTER — Telehealth: Payer: Self-pay

## 2022-02-04 NOTE — Telephone Encounter (Signed)
1-week follow-up after starting RPM with home BP monitoring. BP is a lot better than it was in office. He had some dizziness the first few days of starting losartan but has since resolved. Will continue to monitor.   Average Systolic BP Level 762.83 mmHg Lowest Systolic BP Level 151 mmHg Highest Systolic BP Level 761 mmHg  02/03/2022 Tuesday at 10:44 AM 130 / 77      02/02/2022 Monday at 10:15 AM 122 / 75      02/01/2022 Sunday at 09:48 AM 146 / 95      01/31/2022 Saturday at 11:38 AM 146 / 86      01/30/2022 Friday at 10:08 AM 135 / 83      01/29/2022 Thursday at 12:08 PM 142 / 90      09 /20/2023 Wednesday at 10:20 AM 160 / 102

## 2022-02-05 DIAGNOSIS — R0602 Shortness of breath: Secondary | ICD-10-CM | POA: Diagnosis not present

## 2022-02-13 DIAGNOSIS — I10 Essential (primary) hypertension: Secondary | ICD-10-CM | POA: Diagnosis not present

## 2022-02-18 ENCOUNTER — Ambulatory Visit: Payer: Medicare HMO | Admitting: Student

## 2022-02-26 ENCOUNTER — Ambulatory Visit: Payer: Medicare HMO

## 2022-02-26 DIAGNOSIS — I480 Paroxysmal atrial fibrillation: Secondary | ICD-10-CM | POA: Diagnosis not present

## 2022-03-02 ENCOUNTER — Other Ambulatory Visit: Payer: Self-pay | Admitting: Pulmonary Disease

## 2022-03-07 DIAGNOSIS — R0602 Shortness of breath: Secondary | ICD-10-CM | POA: Diagnosis not present

## 2022-03-16 DIAGNOSIS — I10 Essential (primary) hypertension: Secondary | ICD-10-CM | POA: Diagnosis not present

## 2022-04-01 ENCOUNTER — Other Ambulatory Visit: Payer: Self-pay

## 2022-04-01 DIAGNOSIS — I482 Chronic atrial fibrillation, unspecified: Secondary | ICD-10-CM

## 2022-04-01 MED ORDER — METOPROLOL SUCCINATE ER 25 MG PO TB24: 25.0000 mg | ORAL_TABLET | Freq: Every day | ORAL | 0 refills | Status: DC

## 2022-04-07 DIAGNOSIS — R0602 Shortness of breath: Secondary | ICD-10-CM | POA: Diagnosis not present

## 2022-04-15 DIAGNOSIS — I10 Essential (primary) hypertension: Secondary | ICD-10-CM | POA: Diagnosis not present

## 2022-04-20 ENCOUNTER — Ambulatory Visit
Admission: RE | Admit: 2022-04-20 | Discharge: 2022-04-20 | Disposition: A | Discharge status: Home / Self Care | Payer: Medicare HMO | Source: Ambulatory Visit | Attending: Student | Admitting: Student

## 2022-04-20 ENCOUNTER — Other Ambulatory Visit: Payer: Medicare HMO

## 2022-04-20 DIAGNOSIS — R0600 Dyspnea, unspecified: Secondary | ICD-10-CM | POA: Diagnosis not present

## 2022-04-20 DIAGNOSIS — J449 Chronic obstructive pulmonary disease, unspecified: Secondary | ICD-10-CM | POA: Diagnosis not present

## 2022-04-20 DIAGNOSIS — I700 Atherosclerosis of aorta: Secondary | ICD-10-CM | POA: Diagnosis not present

## 2022-04-20 DIAGNOSIS — R911 Solitary pulmonary nodule: Secondary | ICD-10-CM

## 2022-04-20 DIAGNOSIS — J439 Emphysema, unspecified: Secondary | ICD-10-CM | POA: Diagnosis not present

## 2022-04-23 NOTE — Progress Notes (Signed)
Synopsis: Referred for dyspnea by Tamsen Roers, MD  Subjective:   PATIENT ID: Mario Proctor GENDER: male DOB: 1940-11-24, MRN: [id number]  Chief Complaint  Patient presents with   Follow-up    C/o SOB with exertion, fatigue with any activity    81yM with history of AF, allergy, CHF, COPD, chronic hypoxic respiratory failure on O2 at night, smoking, covid-19 04/2021  Dyspnea over last 3-4 days worse. He has no change in cough really. No productive cough, hemoptysis. He has no orthopnea. He has gained 8 lb since last visit. He hasn't noticed any LE swelling. No fever.   He is still taking trelegy 1 puff once daily rinsing mouth. Hasn't been using nebulizer. Just hasn't thought to use it.   Interval HPI LUL ggo resolved on follow up imaging 12/12  Prednisone and doxy last ov.   Continues on trelegy 1 puff once daily   He says he feels well overall, fatigue and DOE to 150 ft. About the same maybe a little worse than at last visit. No bothersome cough currently.   Otherwise pertinent review of systems is negative.    Past Medical History:  Diagnosis Date   A-fib [facility])    Allergy    CHF (congestive heart failure) (HCC)    Dupuytren contracture    right sm finger   Myocardial infarct ([place]) 2019   Myocardial infarction ([place])    Small bowel obstruction (HCC)      Family History  Problem Relation Age of Onset   Cancer Brother        "in heart and lungs"      Past Surgical History:  Procedure Laterality Date   CARDIOVERSION N/A 07/30/2020   Procedure: CARDIOVERSION;  Surgeon: [name], MD;  Location: [facility];  Service: Cardiovascular;  Laterality: N/A;   COLON SURGERY     bowel blockage   DUPUYTREN CONTRACTURE RELEASE Left 2011   FASCIECTOMY Right 05/02/2015   Procedure: FASCIECTOMY RIGHT SMALL FINGER;  Surgeon: Daryll Brod, MD;  Location: [facility];  Service: Orthopedics;  Laterality: Right;  axillary block in preop   LEFT HEART CATH AND  CORONARY ANGIOGRAPHY N/A 11/03/2016   Procedure: Left Heart Cath and Coronary Angiography;  Surgeon: [name], MD;  Location: [facility];  Service: Cardiovascular;  Laterality: N/A;   TONSILLECTOMY     TYMPANOPLASTY Right     Social History   Socioeconomic History   Marital status: Married    Spouse name: Not on file   Number of children: 3   Years of education: Not on file   Highest education level: Not on file  Occupational History   Not on file  Tobacco Use   Smoking status: Former    Packs/day: 0.50    Years: 50.00    Total pack years: 25.00    Types: Cigarettes    Quit date: 05/02/2021    Years since quitting: 0.9    Passive exposure: Past   Smokeless tobacco: Never   Tobacco comments:    currently smoking .5ppd as of 05/14/20  Vaping Use   Vaping Use: Never used  Substance and Sexual Activity   Alcohol use: Yes    Alcohol/week: 6.0 standard drinks of alcohol    Types: 6 Cans of beer per week    Comment: social   Drug use: No   Sexual activity: Not on file  Other Topics Concern   Not on file  Social History Narrative   **  Merged History Encounter **       Social Determinants of Health   Financial Resource Strain: Not on file  Food Insecurity: Not on file  Transportation Needs: Not on file  Physical Activity: Not on file  Stress: Not on file  Social Connections: Not on file  Intimate Partner Violence: Not on file     No Known Allergies   Outpatient Medications Prior to Visit  Medication Sig Dispense Refill   albuterol (ACCUNEB) 1.25 MG/3ML nebulizer solution Take 3 mLs (1.25 mg total) by nebulization every 4 (four) hours as needed for wheezing or shortness of breath. 75 mL 3   albuterol (VENTOLIN HFA) 108 (90 Base) MCG/ACT inhaler TAKE 2 PUFFS BY MOUTH EVERY 6 HOURS AS NEEDED FOR WHEEZE OR SHORTNESS OF BREATH 18 each 5   amiodarone (PACERONE) 100 MG tablet Take 100 mg by mouth daily.     diphenhydrAMINE (BENADRYL) 25 MG tablet Take 25 mg by  mouth every 6 (six) hours as needed.     Fluticasone-Umeclidin-Vilant (TRELEGY ELLIPTA) 100-62.5-25 MCG/ACT AEPB INHALE 1 PUFF BY MOUTH EVERY DAY 60 each 5   metoprolol succinate (TOPROL-XL) 25 MG 24 hr tablet Take 1 tablet (25 mg total) by mouth daily. 90 tablet 0   rosuvastatin (CRESTOR) 5 MG tablet TAKE 1 TABLET BY MOUTH EVERY DAY 90 tablet 3   Spacer/Aero-Holding Chambers DEVI Use with inhaler 1 each 2   triamcinolone cream (KENALOG) 0.1 % Apply 1 application topically 2 (two) times daily.     XARELTO 20 MG TABS tablet TAKE 1 TABLET BY MOUTH EVERY DAY WITH SUPPER 90 tablet 3   lisinopril (ZESTRIL) 10 MG tablet Take 1 tablet (10 mg total) by mouth at bedtime. 30 tablet 2   doxycycline (VIBRA-TABS) 100 MG tablet Take 1 tablet (100 mg total) by mouth 2 (two) times daily. (Patient not taking: Reported on 04/24/2022) 14 tablet 0   predniSONE (DELTASONE) 10 MG tablet Take 4 tabs for 2 days, then 3 tabs for 2 days, 2 tabs for 2 days, then 1 tab for 2 days, then stop. (Patient not taking: Reported on 04/24/2022) 20 tablet 0   Facility-Administered Medications Prior to Visit  Medication Dose Route Frequency Provider Last Rate Last Admin   ipratropium-albuterol (DUONEB) 0.5-2.5 (3) MG/3ML nebulizer solution 3 mL  3 mL Nebulization Once Martyn Ehrich, NP           Objective:   Physical Exam:  General appearance: 81 y.o., male, NAD, conversant  Eyes: anicteric sclerae; PERRL, tracking appropriately HENT: NCAT; MMM Neck: Trachea midline; no lymphadenopathy, no JVD Lungs: Rhonchi, faint wheeze bl, with normal respiratory effort CV: RRR, no murmur  Abdomen: Soft, non-tender; non-distended, BS present  Extremities: No peripheral edema, warm Skin: Normal turgor and texture; no rash Psych: Appropriate affect Neuro: Alert and oriented to person and place, no focal deficit     Vitals:   04/24/22 0940  BP: 134/78  Pulse: 74  Temp: 98 F (36.7 C)  TempSrc: Oral  SpO2: 95%  Weight: 171  lb 6.4 oz (77.7 kg)  Height: 5\' 11"  (1.803 m)    95% on RA BMI Readings from Last 3 Encounters:  04/24/22 23.91 kg/m  01/28/22 23.71 kg/m  01/23/22 23.43 kg/m   Wt Readings from Last 3 Encounters:  04/24/22 171 lb 6.4 oz (77.7 kg)  01/28/22 170 lb (77.1 kg)  01/23/22 168 lb (76.2 kg)     CBC    Component Value Date/Time   WBC 7.6 04/24/2022  1002   RBC 4.69 04/24/2022 1002   HGB 14.4 04/24/2022 1002   HGB 12.7 (L) 05/14/2021 1019   HCT 43.8 04/24/2022 1002   HCT 38.3 05/14/2021 1019   PLT 303.0 04/24/2022 1002   PLT 223 05/14/2021 1019   MCV 93.3 04/24/2022 1002   MCV 91 05/14/2021 1019   MCH 30.3 05/14/2021 1019   MCH 31.4 05/06/2021 0607   MCHC 33.0 04/24/2022 1002   RDW 13.7 04/24/2022 1002   RDW 11.5 (L) 05/14/2021 1019   LYMPHSABS 1.8 04/24/2022 1002   LYMPHSABS 2.0 09/01/2018 0803   MONOABS 0.7 04/24/2022 1002   EOSABS 0.4 04/24/2022 1002   EOSABS 0.6 (H) 09/01/2018 0803   BASOSABS 0.1 04/24/2022 1002   BASOSABS 0.1 09/01/2018 0803    Eos 200-600 historically, most recently 04/24/22 400  Chest Imaging: CT Chest 01/22/22 reviewed by me with 2cm ggo LUL new since 10/15/21 CT Chest  Pulmonary Functions Testing Results:    Latest Ref Rng & Units 01/07/2017   12:37 PM  PFT Results  FVC-Pre L 3.63   FVC-Predicted Pre % 82   FVC-Post L 3.96   FVC-Predicted Post % 89   Pre FEV1/FVC % % 48   Post FEV1/FCV % % 47   FEV1-Pre L 1.74   FEV1-Predicted Pre % 54   FEV1-Post L 1.86   DLCO uncorrected ml/min/mmHg 15.81   DLCO UNC% % 46   DLCO corrected ml/min/mmHg 17.71   DLCO COR %Predicted % 52   DLVA Predicted % 55   TLC L 8.24   TLC % Predicted % 113   RV % Predicted % 152      Echocardiogram:   TTE 02/26/22: Left ventricle cavity is normal in size. Mild concentric hypertrophy of  the left ventricle. Severe global hypokinesis. LVEF 30-35%. Doppler  evidence of grade I (impaired) diastolic dysfunction, normal LAP.  Calculated EF 37%.  Structurally  normal mitral valve.  Mild to moderate mitral regurgitation.  No significant change compared to 10/2020.   TTE 04/2021:  1. Left ventricular ejection fraction, by estimation, is 45 to 50%. The  left ventricle has mildly decreased function. The left ventricle  demonstrates global hypokinesis. There is mild left ventricular  hypertrophy. Left ventricular diastolic parameters  were normal.   2. Right ventricular systolic function is normal. The right ventricular  size is normal.   3. The mitral valve is normal in structure. Trivial mitral valve  regurgitation. No evidence of mitral stenosis.   4. The aortic valve is normal in structure. Aortic valve regurgitation is  not visualized. No aortic stenosis is present.   5. Compared to previous outpatient study in 10/2020, LVEF is marginally  improved from 35-40%.      Assessment & Plan:   # DOE # ACOS  # Eosinophilic asthma   Plan: - consider dupixent next visit if unimproved  - trelegy 1 puff once daily, rinse mouth after - schedule breathing tests - try out pulmonary rehab afterward - flutter valve 10 slow but firm puffs after trelegy - see you in 3 months or sooner if need be      Maryjane Hurter, MD [facility] 04/24/2022 12:48 PM

## 2022-04-24 ENCOUNTER — Encounter: Payer: Self-pay | Admitting: Student

## 2022-04-24 ENCOUNTER — Telehealth: Payer: Self-pay | Admitting: Student

## 2022-04-24 ENCOUNTER — Ambulatory Visit: Payer: Medicare HMO | Admitting: Student

## 2022-04-24 ENCOUNTER — Other Ambulatory Visit: Payer: Self-pay | Admitting: Internal Medicine

## 2022-04-24 VITALS — BP 134/78 | HR 74 | Temp 98.0°F | Ht 71.0 in | Wt 171.4 lb

## 2022-04-24 DIAGNOSIS — J449 Chronic obstructive pulmonary disease, unspecified: Secondary | ICD-10-CM

## 2022-04-24 DIAGNOSIS — J9611 Chronic respiratory failure with hypoxia: Secondary | ICD-10-CM

## 2022-04-24 LAB — CBC WITH DIFFERENTIAL/PLATELET
Basophils Absolute: 0.1 10*3/uL (ref 0.0–0.1)
Basophils Relative: 0.9 % (ref 0.0–3.0)
Eosinophils Absolute: 0.4 10*3/uL (ref 0.0–0.7)
Eosinophils Relative: 5.2 % — ABNORMAL HIGH (ref 0.0–5.0)
HCT: 43.8 % (ref 39.0–52.0)
Hemoglobin: 14.4 g/dL (ref 13.0–17.0)
Lymphocytes Relative: 24 % (ref 12.0–46.0)
Lymphs Abs: 1.8 10*3/uL (ref 0.7–4.0)
MCHC: 33 g/dL (ref 30.0–36.0)
MCV: 93.3 fl (ref 78.0–100.0)
Monocytes Absolute: 0.7 10*3/uL (ref 0.1–1.0)
Monocytes Relative: 9 % (ref 3.0–12.0)
Neutro Abs: 4.6 10*3/uL (ref 1.4–7.7)
Neutrophils Relative %: 60.9 % (ref 43.0–77.0)
Platelets: 303 10*3/uL (ref 150.0–400.0)
RBC: 4.69 Mil/uL (ref 4.22–5.81)
RDW: 13.7 % (ref 11.5–15.5)
WBC: 7.6 10*3/uL (ref 4.0–10.5)

## 2022-04-24 MED ORDER — TRELEGY ELLIPTA 100-62.5-25 MCG/ACT IN AEPB: 1.0000 | INHALATION_SPRAY | Freq: Every day | RESPIRATORY_TRACT | 0 refills | Status: DC

## 2022-04-24 NOTE — Patient Instructions (Addendum)
-   labs today - trelegy 1 puff once daily, rinse mouth after - schedule breathing tests - try out pulmonary rehab afterward - flutter valve 10 slow but firm puffs after trelegy - see you in 3 months or sooner if need be

## 2022-04-24 NOTE — Telephone Encounter (Signed)
error 

## 2022-04-27 LAB — IGE: IgE (Immunoglobulin E), Serum: 239 kU/L — ABNORMAL HIGH (ref <=114)

## 2022-04-29 ENCOUNTER — Telehealth (HOSPITAL_COMMUNITY): Payer: Self-pay

## 2022-05-07 DIAGNOSIS — J9610 Chronic respiratory failure, unspecified whether with hypoxia or hypercapnia: Secondary | ICD-10-CM | POA: Diagnosis not present

## 2022-05-07 DIAGNOSIS — R0602 Shortness of breath: Secondary | ICD-10-CM | POA: Diagnosis not present

## 2022-05-12 ENCOUNTER — Telehealth (HOSPITAL_COMMUNITY): Payer: Self-pay

## 2022-05-12 NOTE — Telephone Encounter (Signed)
Pt insurance is active and benefits verified through [facility]. Co-pay $30.00, DED $0.00/$0.00 met, out of pocket $5,900.00/$0.00 met, co-insurance 0%. No pre-authorization required. Alex/[employer], 05/12/22 @ 3:36PM, ZOX#[id number]

## 2022-05-12 NOTE — Telephone Encounter (Signed)
Called patient to see if he was interested in participating in the Pulmonary Rehab Program. Patient stated yes. Patient will come in for orientation on 05/27/22 @ 10:30AM and will attend the 10:15AM exercise class.   Tourist information centre manager.

## 2022-05-16 DIAGNOSIS — I10 Essential (primary) hypertension: Secondary | ICD-10-CM | POA: Diagnosis not present

## 2022-05-21 ENCOUNTER — Telehealth: Payer: Self-pay | Admitting: Student

## 2022-05-21 NOTE — Telephone Encounter (Signed)
Called and discussed results. He'll keep using his current oxygen setup at night.

## 2022-05-21 NOTE — Telephone Encounter (Signed)
Received ONO on RA done by [facility] 05/07/22   O2 < 88% ra for total of 6 hours 7 min 56 sec  02 < 89% ra for total of 7 hours 16 min O2 > 90% ra for total of 1 hour 5 min 28 sec   I will scan the results to Dr Glenetta Hew email   Please advise on results/recs, thanks!

## 2022-05-27 ENCOUNTER — Encounter (HOSPITAL_COMMUNITY): Payer: Self-pay

## 2022-05-27 ENCOUNTER — Encounter (HOSPITAL_COMMUNITY)
Admission: RE | Admit: 2022-05-27 | Discharge: 2022-05-27 | Disposition: A | Discharge status: Home / Self Care | Payer: Medicare HMO | Source: Ambulatory Visit | Attending: Student | Admitting: Student

## 2022-05-27 VITALS — BP 158/90 | HR 80 | Ht 71.0 in | Wt 173.7 lb

## 2022-05-27 DIAGNOSIS — Z5189 Encounter for other specified aftercare: Secondary | ICD-10-CM | POA: Insufficient documentation

## 2022-05-27 DIAGNOSIS — J449 Chronic obstructive pulmonary disease, unspecified: Secondary | ICD-10-CM | POA: Diagnosis not present

## 2022-05-27 NOTE — Progress Notes (Signed)
Pulmonary Rehab Orientation Physical Assessment Note   Physical assessment reveals that the patient is alert and oriented x 4.  Heart rate is normal, breath sounds with diminished with expiratory wheezing in all lobes. Pt denies cough. Bowel sounds present in all 4 quadrants.  Pt denies abdominal discomfort, nausea, vomiting, diarrhea. Grip strength equal, strong. Distal pulses palpable; no swelling to lower extremities.

## 2022-05-27 NOTE — Progress Notes (Signed)
Pulmonary Individual Treatment Plan  Patient Details  Name: Mario Proctor MRN: [id number] Date of Birth: 02/21/1941 Referring Provider:   April Manson Pulmonary Rehab Walk Test from 05/27/2022 in [facility] [place]  Referring Provider [name]       Initial Encounter Date:  Flowsheet Row Pulmonary Rehab Walk Test from 05/27/2022 in [facility]  Date 05/27/22       Visit Diagnosis: Stage 2 moderate COPD by GOLD classification ([place])  Patient's Home Medications on Admission:   Current Outpatient Medications:    albuterol (ACCUNEB) 1.25 MG/3ML nebulizer solution, Take 3 mLs (1.25 mg total) by nebulization every 4 (four) hours as needed for wheezing or shortness of breath., Disp: 75 mL, Rfl: 3   albuterol (VENTOLIN HFA) 108 (90 Base) MCG/ACT inhaler, TAKE 2 PUFFS BY MOUTH EVERY 6 HOURS AS NEEDED FOR WHEEZE OR SHORTNESS OF BREATH, Disp: 18 each, Rfl: 5   amiodarone (PACERONE) 100 MG tablet, Take 100 mg by mouth daily., Disp: , Rfl:    diphenhydrAMINE (BENADRYL) 25 MG tablet, Take 25 mg by mouth every 6 (six) hours as needed., Disp: , Rfl:    doxycycline (VIBRA-TABS) 100 MG tablet, Take 1 tablet (100 mg total) by mouth 2 (two) times daily., Disp: 14 tablet, Rfl: 0   Fluticasone-Umeclidin-Vilant (TRELEGY ELLIPTA) 100-62.5-25 MCG/ACT AEPB, INHALE 1 PUFF BY MOUTH EVERY DAY, Disp: 60 each, Rfl: 5   Fluticasone-Umeclidin-Vilant (TRELEGY ELLIPTA) 100-62.5-25 MCG/ACT AEPB, Inhale 1 puff into the lungs daily., Disp: 2 each, Rfl: 0   lisinopril (ZESTRIL) 10 MG tablet, TAKE 1 TABLET BY MOUTH EVERYDAY AT BEDTIME, Disp: 90 tablet, Rfl: 1   metoprolol succinate (TOPROL-XL) 25 MG 24 hr tablet, Take 1 tablet (25 mg total) by mouth daily., Disp: 90 tablet, Rfl: 0   predniSONE (DELTASONE) 10 MG tablet, Take 4 tabs for 2 days, then 3 tabs for 2 days, 2 tabs for 2 days, then 1 tab for 2 days, then  stop., Disp: 20 tablet, Rfl: 0   rosuvastatin (CRESTOR) 5 MG tablet, TAKE 1 TABLET BY MOUTH EVERY DAY, Disp: 90 tablet, Rfl: 3   Spacer/Aero-Holding Chambers DEVI, Use with inhaler, Disp: 1 each, Rfl: 2   triamcinolone cream (KENALOG) 0.1 %, Apply 1 application topically 2 (two) times daily., Disp: , Rfl:    XARELTO 20 MG TABS tablet, TAKE 1 TABLET BY MOUTH EVERY DAY WITH SUPPER, Disp: 90 tablet, Rfl: 3  Current Facility-Administered Medications:    ipratropium-albuterol (DUONEB) 0.5-2.5 (3) MG/3ML nebulizer solution 3 mL, 3 mL, Nebulization, Once, Martyn Ehrich, NP  Past Medical History: Past Medical History:  Diagnosis Date   A-fib [facility])    Allergy    CHF (congestive heart failure) ([place])    Dupuytren contracture    right sm finger   Myocardial infarct ([place]) 2019   Myocardial infarction ([place])    Small bowel obstruction (H[facility])     Tobacco Use: Social History   Tobacco Use  Smoking Status Former   Packs/day: 0.50   Years: 50.00   Total pack years: 25.00   Types: Cigarettes   Quit date: 05/02/2021   Years since quitting: 1.0   Passive exposure: Past  Smokeless Tobacco Never  Tobacco Comments   currently smoking .5ppd as of 05/14/20    Labs: Review Flowsheet       Latest Ref Rng & Units 09/01/2018 06/12/2020  Labs for ITP Cardiac and Pulmonary Rehab  Cholestrol 100 - 199 mg/dL 140  139   LDL (calc) 0 - 99 mg/dL 67  60   HDL-C >39 mg/dL 62  66   Trlycerides 0 - 149 mg/dL 54  61     Capillary Blood Glucose: No results found for: "GLUCAP"   Pulmonary Assessment Scores:  Pulmonary Assessment Scores     Row Name 05/27/22 1050         ADL UCSD   ADL Phase Entry     SOB Score total 49       CAT Score   CAT Score 15       mMRC Score   mMRC Score 4             UCSD: Self-administered rating of dyspnea associated with activities of daily living (ADLs) 6-point scale (0 = "not at all" to 5 = "maximal or unable to do because of breathlessness")  Scoring  Scores range from 0 to 120.  Minimally important difference is 5 units  CAT: CAT can identify the health impairment of COPD patients and is better correlated with disease progression.  CAT has a scoring range of zero to 40. The CAT score is classified into four groups of low (less than 10), medium (10 - 20), high (21-30) and very high (31-40) based on the impact level of disease on health status. A CAT score over 10 suggests significant symptoms.  A worsening CAT score could be explained by an exacerbation, poor medication adherence, poor inhaler technique, or progression of COPD or comorbid conditions.  CAT MCID is 2 points  mMRC: mMRC (Modified Medical Research Council) Dyspnea Scale is used to assess the degree of baseline functional disability in patients of respiratory disease due to dyspnea. No minimal important difference is established. A decrease in score of 1 point or greater is considered a positive change.   Pulmonary Function Assessment:  Pulmonary Function Assessment - 05/27/22 1015       Breath   Bilateral Breath Sounds Wheezes;Expiratory    Shortness of Breath Yes;Limiting activity             Exercise Target Goals: Exercise Program Goal: Individual exercise prescription set using results from initial 6 min walk test and THRR while considering  patient's activity barriers and safety.   Exercise Prescription Goal: Initial exercise prescription builds to 30-45 minutes a day of aerobic activity, 2-3 days per week.  Home exercise guidelines will be given to patient during program as part of exercise prescription that the participant will acknowledge.  Activity Barriers & Risk Stratification:  Activity Barriers & Cardiac Risk Stratification - 05/27/22 1046       Activity Barriers & Cardiac Risk Stratification   Activity Barriers Arthritis;Deconditioning;Muscular Weakness;Shortness of Breath;History of Falls             6 Minute Walk:  6 Minute Walk     Row  Name 05/27/22 1148         6 Minute Walk   Phase Initial     Distance 860 feet     Walk Time 6 minutes     # of Rest Breaks 2  1:45-2:38, 3:24-4:45     MPH 1.63     METS 1.95     RPE 13     Perceived Dyspnea  1     VO2 Peak 6.83     Symptoms No     Resting HR 79 bpm     Resting BP 158/90  Resting Oxygen Saturation  96 %     Exercise Oxygen Saturation  during 6 min walk 85 %     Max Ex. HR 87 bpm     Max Ex. BP 164/84     2 Minute Post BP 140/80       Interval HR   1 Minute HR 84     2 Minute HR 84     3 Minute HR 86     4 Minute HR 8     5 Minute HR 85     6 Minute HR 80     2 Minute Post HR 78     Interval Heart Rate? Yes       Interval Oxygen   Interval Oxygen? Yes     Baseline Oxygen Saturation % 96 %     1 Minute Oxygen Saturation % 95 %     1 Minute Liters of Oxygen 0 L     2 Minute Oxygen Saturation % 88 %     2 Minute Liters of Oxygen 0 L     3 Minute Oxygen Saturation % 89 %     3 Minute Liters of Oxygen 0 L     4 Minute Oxygen Saturation % 88 %     4 Minute Liters of Oxygen 0 L     5 Minute Oxygen Saturation % 89 %  85 @ 5:47     5 Minute Liters of Oxygen 0 L  Increased to 1L     6 Minute Oxygen Saturation % 95 %     6 Minute Liters of Oxygen 0 L     2 Minute Post Oxygen Saturation % 96 %     2 Minute Post Liters of Oxygen 0 L              Oxygen Initial Assessment:  Oxygen Initial Assessment - 05/27/22 1047       Home Oxygen   Home Oxygen Device Home Concentrator;E-Tanks    Sleep Oxygen Prescription Continuous    Liters per minute 2    Home Exercise Oxygen Prescription None    Home Resting Oxygen Prescription None    Compliance with Home Oxygen Use No      Initial 6 min Walk   Oxygen Used None      Program Oxygen Prescription   Program Oxygen Prescription None      Intervention   Short Term Goals To learn and understand importance of maintaining oxygen saturations>88%;To learn and demonstrate proper use of respiratory  medications;To learn and understand importance of monitoring SPO2 with pulse oximeter and demonstrate accurate use of the pulse oximeter.;To learn and demonstrate proper pursed lip breathing techniques or other breathing techniques. ;To learn and exhibit compliance with exercise, home and travel O2 prescription    Long  Term Goals Exhibits compliance with exercise, home  and travel O2 prescription;Verbalizes importance of monitoring SPO2 with pulse oximeter and return demonstration;Maintenance of O2 saturations>88%;Exhibits proper breathing techniques, such as pursed lip breathing or other method taught during program session;Compliance with respiratory medication;Demonstrates proper use of MDI's             Oxygen Re-Evaluation:   Oxygen Discharge (Final Oxygen Re-Evaluation):   Initial Exercise Prescription:  Initial Exercise Prescription - 05/27/22 1100       Date of Initial Exercise RX and Referring Provider   Date 05/27/22    Referring Provider [name]    Expected Discharge Date 07/30/22  Oxygen   Oxygen Continuous    Liters 1    Maintain Oxygen Saturation 88% or higher      Arm Ergometer   Level 1    Watts 5    RPM 20    Minutes 15      Track   Minutes 15    METs 1.95      Prescription Details   Frequency (times per week) 2    Duration Progress to 30 minutes of continuous aerobic without signs/symptoms of physical distress      Intensity   THRR 40-80% of Max Heartrate 56-112    Ratings of Perceived Exertion 11-13    Perceived Dyspnea 0-4      Progression   Progression Continue progressive overload as per policy without signs/symptoms or physical distress.      Resistance Training   Training Prescription Yes    Weight red bands    Reps 10-15             Perform Capillary Blood Glucose checks as needed.  Exercise Prescription Changes:   Exercise Comments:   Exercise Goals and Review:   Exercise Goals     Row Name 05/27/22 1053              Exercise Goals   Increase Physical Activity Yes       Intervention Provide advice, education, support and counseling about physical activity/exercise needs.;Develop an individualized exercise prescription for aerobic and resistive training based on initial evaluation findings, risk stratification, comorbidities and participant's personal goals.       Expected Outcomes Short Term: Attend rehab on a regular basis to increase amount of physical activity.;Long Term: Add in home exercise to make exercise part of routine and to increase amount of physical activity.;Long Term: Exercising regularly at least 3-5 days a week.       Increase Strength and Stamina Yes       Intervention Provide advice, education, support and counseling about physical activity/exercise needs.;Develop an individualized exercise prescription for aerobic and resistive training based on initial evaluation findings, risk stratification, comorbidities and participant's personal goals.       Expected Outcomes Short Term: Increase workloads from initial exercise prescription for resistance, speed, and METs.;Short Term: Perform resistance training exercises routinely during rehab and add in resistance training at home;Long Term: Improve cardiorespiratory fitness, muscular endurance and strength as measured by increased METs and functional capacity (6MWT)       Able to understand and use rate of perceived exertion (RPE) scale Yes       Intervention Provide education and explanation on how to use RPE scale       Expected Outcomes Short Term: Able to use RPE daily in rehab to express subjective intensity level;Long Term:  Able to use RPE to guide intensity level when exercising independently       Able to understand and use Dyspnea scale Yes       Intervention Provide education and explanation on how to use Dyspnea scale       Expected Outcomes Short Term: Able to use Dyspnea scale daily in rehab to express subjective sense of shortness  of breath during exertion;Long Term: Able to use Dyspnea scale to guide intensity level when exercising independently       Knowledge and understanding of Target Heart Rate Range (THRR) Yes       Intervention Provide education and explanation of THRR including how the numbers were predicted and where they are located for  reference       Expected Outcomes Short Term: Able to state/look up THRR;Long Term: Able to use THRR to govern intensity when exercising independently;Short Term: Able to use daily as guideline for intensity in rehab       Understanding of Exercise Prescription Yes       Intervention Provide education, explanation, and written materials on patient's individual exercise prescription       Expected Outcomes Short Term: Able to explain program exercise prescription;Long Term: Able to explain home exercise prescription to exercise independently                Exercise Goals Re-Evaluation :   Discharge Exercise Prescription (Final Exercise Prescription Changes):   Nutrition:  Target Goals: Understanding of nutrition guidelines, daily intake of sodium 1500mg , cholesterol 200mg , calories 30% from fat and 7% or less from saturated fats, daily to have 5 or more servings of fruits and vegetables.  Biometrics:  Pre Biometrics - 05/27/22 1029       Pre Biometrics   Grip Strength 18 kg              Nutrition Therapy Plan and Nutrition Goals:   Nutrition Assessments:  MEDIFICTS Score Key: ?70 Need to make dietary changes  40-70 Heart Healthy Diet ? 40 Therapeutic Level Cholesterol Diet   Picture Your Plate Scores: <94 Unhealthy dietary pattern with much room for improvement. 41-50 Dietary pattern unlikely to meet recommendations for good health and room for improvement. 51-60 More healthful dietary pattern, with some room for improvement.  >60 Healthy dietary pattern, although there may be some specific behaviors that could be improved.    Nutrition Goals  Re-Evaluation:   Nutrition Goals Discharge (Final Nutrition Goals Re-Evaluation):   Psychosocial: Target Goals: Acknowledge presence or absence of significant depression and/or stress, maximize coping skills, provide positive support system. Participant is able to verbalize types and ability to use techniques and skills needed for reducing stress and depression.  Initial Review & Psychosocial Screening:  Initial Psych Review & Screening - 05/27/22 1040       Initial Review   Current issues with None Identified      Family Dynamics   Good Support System? Yes    Comments wife      Barriers   Psychosocial barriers to participate in program There are no identifiable barriers or psychosocial needs.      Screening Interventions   Interventions Encouraged to exercise             Quality of Life Scores:  Scores of 19 and below usually indicate a poorer quality of life in these areas.  A difference of  2-3 points is a clinically meaningful difference.  A difference of 2-3 points in the total score of the Quality of Life Index has been associated with significant improvement in overall quality of life, self-image, physical symptoms, and general health in studies assessing change in quality of life.  PHQ-9: Review Flowsheet       05/27/2022  Depression screen PHQ 2/9  Decreased Interest 0  Down, Depressed, Hopeless 0  PHQ - 2 Score 0  Altered sleeping 0  Tired, decreased energy 1  Change in appetite 0  Feeling bad or failure about yourself  0  Trouble concentrating 0  Moving slowly or fidgety/restless 0  Suicidal thoughts 0  PHQ-9 Score 1  Difficult doing work/chores Not difficult at all   Interpretation of Total Score  Total Score Depression Severity:  1-4 = Minimal  depression, 5-9 = Mild depression, 10-14 = Moderate depression, 15-19 = Moderately severe depression, 20-27 = Severe depression   Psychosocial Evaluation and Intervention:  Psychosocial Evaluation -  05/27/22 1042       Psychosocial Evaluation & Interventions   Interventions Stress management education;Relaxation education;Encouraged to exercise with the program and follow exercise prescription    Comments Pt denies psychosocial concerns.    Expected Outcomes For pt to participate in rehab free of psychosocial concerns    Continue Psychosocial Services  No Follow up required             Psychosocial Re-Evaluation:   Psychosocial Discharge (Final Psychosocial Re-Evaluation):   Education: Education Goals: Education classes will be provided on a weekly basis, covering required topics. Participant will state understanding/return demonstration of topics presented.  Learning Barriers/Preferences:  Learning Barriers/Preferences - 05/27/22 1044       Learning Barriers/Preferences   Learning Barriers Hearing   had hearing aids   Learning Preferences None             Education Topics: Introduction to Pulmonary Rehab Group instruction provided by PowerPoint, verbal discussion, and written material to support subject matter. Instructor reviews what Pulmonary Rehab is, the purpose of the program, and how patients are referred.     Know Your Numbers Group instruction that is supported by a PowerPoint presentation. Instructor discusses importance of knowing and understanding resting, exercise, and post-exercise oxygen saturation, heart rate, and blood pressure. Oxygen saturation, heart rate, blood pressure, rating of perceived exertion, and dyspnea are reviewed along with a normal range for these values.    Exercise for the Pulmonary Patient Group instruction that is supported by a PowerPoint presentation. Instructor discusses benefits of exercise, core components of exercise, frequency, duration, and intensity of an exercise routine, importance of utilizing pulse oximetry during exercise, safety while exercising, and options of places to exercise outside of rehab.        MET Level  Group instruction provided by PowerPoint, verbal discussion, and written material to support subject matter. Instructor reviews what METs are and how to increase METs.    Pulmonary Medications Verbally interactive group education provided by instructor with focus on inhaled medications and proper administration.   Anatomy and Physiology of the Respiratory System Group instruction provided by PowerPoint, verbal discussion, and written material to support subject matter. Instructor reviews respiratory cycle and anatomical components of the respiratory system and their functions. Instructor also reviews differences in obstructive and restrictive respiratory diseases with examples of each.    Oxygen Safety Group instruction provided by PowerPoint, verbal discussion, and written material to support subject matter. There is an overview of "What is Oxygen" and "Why do we need it".  Instructor also reviews how to create a safe environment for oxygen use, the importance of using oxygen as prescribed, and the risks of noncompliance. There is a brief discussion on traveling with oxygen and resources the patient may utilize.   Oxygen Use Group instruction provided by PowerPoint, verbal discussion, and written material to discuss how supplemental oxygen is prescribed and different types of oxygen supply systems. Resources for more information are provided.    Breathing Techniques Group instruction that is supported by demonstration and informational handouts. Instructor discusses the benefits of pursed lip and diaphragmatic breathing and detailed demonstration on how to perform both.     Risk Factor Reduction Group instruction that is supported by a PowerPoint presentation. Instructor discusses the definition of a risk factor, different risk factors for pulmonary  disease, and how the heart and lungs work together.   MD Day A group question and answer session with a medical  doctor that allows participants to ask questions that relate to their pulmonary disease state.   Nutrition for the Pulmonary Patient Group instruction provided by PowerPoint slides, verbal discussion, and written materials to support subject matter. The instructor gives an explanation and review of healthy diet recommendations, which includes a discussion on weight management, recommendations for fruit and vegetable consumption, as well as protein, fluid, caffeine, fiber, sodium, sugar, and alcohol. Tips for eating when patients are short of breath are discussed.    Other Education Group or individual verbal, written, or video instructions that support the educational goals of the pulmonary rehab program.    Knowledge Questionnaire Score:  Knowledge Questionnaire Score - 05/27/22 1205       Knowledge Questionnaire Score   Pre Score 14/18             Core Components/Risk Factors/Patient Goals at Admission:  Personal Goals and Risk Factors at Admission - 05/27/22 1044       Core Components/Risk Factors/Patient Goals on Admission   Improve shortness of breath with ADL's Yes    Intervention Provide education, individualized exercise plan and daily activity instruction to help decrease symptoms of SOB with activities of daily living.    Expected Outcomes Short Term: Improve cardiorespiratory fitness to achieve a reduction of symptoms when performing ADLs;Long Term: Be able to perform more ADLs without symptoms or delay the onset of symptoms    Heart Failure Yes    Intervention Provide a combined exercise and nutrition program that is supplemented with education, support and counseling about heart failure. Directed toward relieving symptoms such as shortness of breath, decreased exercise tolerance, and extremity edema.    Expected Outcomes Improve functional capacity of life;Short term: Attendance in program 2-3 days a week with increased exercise capacity. Reported lower sodium intake.  Reported increased fruit and vegetable intake. Reports medication compliance.;Short term: Daily weights obtained and reported for increase. Utilizing diuretic protocols set by physician.             Core Components/Risk Factors/Patient Goals Review:    Core Components/Risk Factors/Patient Goals at Discharge (Final Review):    ITP Comments: Dr. Rodman Pickle is Medical Director for Pulmonary Rehab at [facility].

## 2022-05-27 NOTE — Progress Notes (Signed)
Mario Proctor 82 y.o. male Pulmonary Rehab Orientation Note This patient who was referred to Pulmonary Rehab by Dr. Verlee Monte with the diagnosis of COPD stage 2 arrived today in Cardiac and Pulmonary Rehab. He arrived ambulatory with normal gait. He does carry portable oxygen. [employer] is the provider for their DME. Per patient, Mario Proctor uses oxygen at night. Color good, skin warm and dry. Patient is oriented to time and place. Patient's medical history, psychosocial health, and medications reviewed. Psychosocial assessment reveals patient lives with spouse. Mario Proctor is currently retired. Patient hobbies include fishing. Patient reports his stress level is low. Areas of stress/anxiety include N/A. Patient does not exhibit signs of depression. PHQ2/9 score 0/1. Mario Proctor shows good  coping skills with positive outlook on life. Offered emotional support and reassurance. Will continue to monitor. Physical assessment performed by Nurse pick: Mario Ores RN. Please see their orientation physical assessment note. Mario Proctor reports he  does take medications as prescribed. Patient states he  follows a regular  diet. The patient reports no specific efforts to gain or lose weight.. Patient's weight will be monitored closely. Demonstration and practice of PLB using pulse oximeter. Mario Proctor able to return demonstration satisfactorily. Safety and hand hygiene in the exercise area reviewed with patient. Mario Proctor voices understanding of the information reviewed. Department expectations discussed with patient and achievable goals were set. The patient shows enthusiasm about attending the program and we look forward to working with Mario Proctor completed a 6 min walk test today and is scheduled to begin exercise on 06/02/22 at 10:15 am.   [phone] Mario Plumber, MS, ACSM-CEP

## 2022-05-27 NOTE — Progress Notes (Signed)
Mario Proctor 82 y.o. male  Initial Psychosocial Assessment  Pt psychosocial assessment reveals pt lives with their spouse. Pt is currently retired. Pt hobbies include fishing. Pt reports his  stress level is low. Areas of stress/anxiety include N/A. Pt does not exhibit signs of depression. Pt shows good  coping skills with positive outlook . Offered emotional support and reassurance. Will continue to monitor for concerns.   05/27/2022 12:02 PM

## 2022-06-02 ENCOUNTER — Encounter (HOSPITAL_COMMUNITY)
Admission: RE | Admit: 2022-06-02 | Discharge: 2022-06-02 | Disposition: A | Discharge status: Home / Self Care | Payer: Medicare HMO | Source: Ambulatory Visit | Attending: Student | Admitting: Student

## 2022-06-02 DIAGNOSIS — J449 Chronic obstructive pulmonary disease, unspecified: Secondary | ICD-10-CM | POA: Diagnosis not present

## 2022-06-02 DIAGNOSIS — Z5189 Encounter for other specified aftercare: Secondary | ICD-10-CM | POA: Diagnosis not present

## 2022-06-02 NOTE — Progress Notes (Signed)
Daily Session Note  Patient Details  Name: Mario Proctor MRN: [id number] Date of Birth: [birth year] Referring Provider:   April Manson Pulmonary Rehab Walk Test from 05/27/2022 in [facility], V[facility], & [facility]  Referring Provider Verlee Monte       Encounter Date: 06/02/2022  Check In:  Session Check In - 06/02/22 1200       Check-In   Supervising physician immediately available to respond to emergencies [facility] MD immediately available    Physician(s) Eric Form, NP    Location [facility]    Staff Present Elmon Else, MS, ACSM-CEP, Exercise Physiologist;Other;Mary Margette Fast, RN, BSN;Niomi Valent BS, ACSM-CEP, Exercise Physiologist    Virtual Visit No    Medication changes reported     No    Fall or balance concerns reported    No    Tobacco Cessation No Change    Warm-up and Cool-down Performed as group-led instruction    Resistance Training Performed Yes    VAD Patient? No    PAD/SET Patient? No      Pain Assessment   Currently in Pain? No/denies    Multiple Pain Sites No             Capillary Blood Glucose: No results found for this or any previous visit (from the past 24 hour(s)).    Social History   Tobacco Use  Smoking Status Former   Packs/day: 0.50   Years: 50.00   Total pack years: 25.00   Types: Cigarettes   Quit date: 05/02/2021   Years since quitting: 1.0   Passive exposure: Past  Smokeless Tobacco Never  Tobacco Comments   currently smoking .5ppd as of 05/14/20    Goals Met:  Exercise tolerated well No report of concerns or symptoms today Strength training completed today  Goals Unmet:  Not Applicable  Comments: First day of exercise. Hypoxic on track with distance, requiring rest.  Service time is from 1017 to 1137.    Dr. Rodman Pickle is Medical Director for Pulmonary Rehab at [facility].

## 2022-06-03 ENCOUNTER — Encounter: Payer: Self-pay | Admitting: Student

## 2022-06-03 DIAGNOSIS — J449 Chronic obstructive pulmonary disease, unspecified: Secondary | ICD-10-CM | POA: Diagnosis not present

## 2022-06-03 DIAGNOSIS — N179 Acute kidney failure, unspecified: Secondary | ICD-10-CM | POA: Diagnosis not present

## 2022-06-03 DIAGNOSIS — I4891 Unspecified atrial fibrillation: Secondary | ICD-10-CM | POA: Diagnosis not present

## 2022-06-03 DIAGNOSIS — I5022 Chronic systolic (congestive) heart failure: Secondary | ICD-10-CM | POA: Diagnosis not present

## 2022-06-03 DIAGNOSIS — N281 Cyst of kidney, acquired: Secondary | ICD-10-CM | POA: Diagnosis not present

## 2022-06-04 ENCOUNTER — Encounter (HOSPITAL_COMMUNITY)
Admission: RE | Admit: 2022-06-04 | Discharge: 2022-06-04 | Disposition: A | Discharge status: Home / Self Care | Payer: Medicare HMO | Source: Ambulatory Visit | Attending: Student | Admitting: Student

## 2022-06-04 DIAGNOSIS — Z5189 Encounter for other specified aftercare: Secondary | ICD-10-CM | POA: Diagnosis not present

## 2022-06-04 DIAGNOSIS — J449 Chronic obstructive pulmonary disease, unspecified: Secondary | ICD-10-CM

## 2022-06-04 NOTE — Progress Notes (Signed)
Daily Session Note  Patient Details  Name: Mario Proctor MRN: [id number] Date of Birth: 04/26/1941 Referring Provider:   April Manson Pulmonary Rehab Walk Test from 05/27/2022 in [facility] for [facility], Vascular, & [facility]  Referring Provider [name]       Encounter Date: 06/04/2022  Check In:  Session Check In - 06/04/22 1017       Check-In   Supervising physician immediately available to respond to emergencies [facility] MD immediately available    Physician(s) Dr. Silas Sacramento    Location [facility]    Staff Present Janine Ores, RN, Quentin Ore, MS, ACSM-CEP, Exercise Physiologist;Randi Yevonne Pax, ACSM-CEP, Exercise Physiologist;Other    Virtual Visit No    Medication changes reported     No    Fall or balance concerns reported    No    Tobacco Cessation No Change    Warm-up and Cool-down Performed as group-led instruction    Resistance Training Performed Yes    VAD Patient? No    PAD/SET Patient? No      Pain Assessment   Currently in Pain? No/denies    Multiple Pain Sites No             Capillary Blood Glucose: No results found for this or any previous visit (from the past 24 hour(s)).    Social History   Tobacco Use  Smoking Status Former   Packs/day: 0.50   Years: 50.00   Total pack years: 25.00   Types: Cigarettes   Quit date: 05/02/2021   Years since quitting: 1.0   Passive exposure: Past  Smokeless Tobacco Never  Tobacco Comments   currently smoking .5ppd as of 05/14/20    Goals Met:  Proper associated with RPD/PD & O2 Sat Independence with exercise equipment Exercise tolerated well Strength training completed today  Goals Unmet:  Not Applicable  Comments: Service time is from 1015 to 1141.    Dr. Rodman Pickle is Medical Director for Pulmonary Rehab at [facility].

## 2022-06-07 DIAGNOSIS — R0602 Shortness of breath: Secondary | ICD-10-CM | POA: Diagnosis not present

## 2022-06-09 ENCOUNTER — Other Ambulatory Visit: Payer: Self-pay | Admitting: Nephrology

## 2022-06-09 ENCOUNTER — Encounter (HOSPITAL_COMMUNITY)
Admission: RE | Admit: 2022-06-09 | Discharge: 2022-06-09 | Disposition: A | Discharge status: Home / Self Care | Payer: Medicare HMO | Source: Ambulatory Visit | Attending: Student | Admitting: Student

## 2022-06-09 VITALS — Wt 173.3 lb

## 2022-06-09 DIAGNOSIS — J449 Chronic obstructive pulmonary disease, unspecified: Secondary | ICD-10-CM

## 2022-06-09 DIAGNOSIS — I5022 Chronic systolic (congestive) heart failure: Secondary | ICD-10-CM

## 2022-06-09 DIAGNOSIS — N281 Cyst of kidney, acquired: Secondary | ICD-10-CM

## 2022-06-09 DIAGNOSIS — N179 Acute kidney failure, unspecified: Secondary | ICD-10-CM

## 2022-06-09 DIAGNOSIS — Z5189 Encounter for other specified aftercare: Secondary | ICD-10-CM | POA: Diagnosis not present

## 2022-06-09 NOTE — Progress Notes (Signed)
Daily Session Note  Patient Details  Name: Mario Proctor MRN: [id number] Date of Birth: 1941-02-18 Referring Provider:   April Manson Pulmonary Rehab Walk Test from 05/27/2022 in [facility] for Heart, Vascular, & Lung [facility]  Referring Provider Verlee Monte       Encounter Date: 06/09/2022  Check In:  Session Check In - 06/09/22 1016       Check-In   Supervising physician immediately available to respond to emergencies [facility] immediately available    Physician(s) Mannam    [facility]    Staff Present Janine Ores, RN, Quentin Ore, MS, ACSM-CEP, Exercise Physiologist;Randi Olen Cordial BS, ACSM-CEP, Exercise Physiologist;Other    Virtual Visit No    Medication changes reported     No    Fall or balance concerns reported    No    Tobacco Cessation No Change    Warm-up and Cool-down Performed as group-led instruction    Resistance Training Performed Yes    VAD Patient? No    PAD/SET Patient? No      Pain Assessment   Currently in Pain? No/denies    Multiple Pain Sites No             Capillary Blood Glucose: No results found for this or any previous visit (from the past 24 hour(s)).   Exercise Prescription Changes - 06/09/22 1200       Response to Exercise   Blood Pressure (Admit) 126/70    Blood Pressure (Exercise) 130/78    Blood Pressure (Exit) 128/68    Heart Rate (Admit) 71 bpm    Heart Rate (Exercise) 83 bpm    Heart Rate (Exit) 76 bpm    Oxygen Saturation (Admit) 97 %    Oxygen Saturation (Exercise) 97 %    Oxygen Saturation (Exit) 98 %    Rating of Perceived Exertion (Exercise) 14    Perceived Dyspnea (Exercise) 3    Duration Continue with 30 min of aerobic exercise without signs/symptoms of physical distress.    Intensity THRR unchanged      Progression   Progression Continue to progress workloads to maintain intensity without signs/symptoms of physical distress.      Resistance Training   Training  Prescription Yes    Weight red bands    Reps 10-15    Time 10 Minutes      Oxygen   Oxygen Continuous    Liters 2      Arm Ergometer   Level 1    Watts 1    RPM 47    Minutes 15      Track   Minutes 15    METs 1.92      Oxygen   Maintain Oxygen Saturation 88% or higher             Social History   Tobacco Use  Smoking Status Former   Packs/day: 0.50   Years: 50.00   Total pack years: 25.00   Types: Cigarettes   Quit date: 05/02/2021   Years since quitting: 1.1   Passive exposure: Past  Smokeless Tobacco Never  Tobacco Comments   currently smoking .5ppd as of 05/14/20    Goals Met:  Improved SOB with ADL's Exercise tolerated well Queuing for purse lip breathing No report of concerns or symptoms today Strength training completed today  Goals Unmet:  Not Applicable  Comments: Service time is from 1017 to 1139    Dr. Rodman Pickle is Medical Director for  Pulmonary Rehab at [facility].

## 2022-06-11 ENCOUNTER — Encounter (HOSPITAL_COMMUNITY): Payer: Self-pay

## 2022-06-11 ENCOUNTER — Encounter (HOSPITAL_COMMUNITY)
Admission: RE | Admit: 2022-06-11 | Discharge: 2022-06-11 | Disposition: A | Discharge status: Home / Self Care | Payer: Medicare HMO | Source: Ambulatory Visit | Attending: Student | Admitting: Student

## 2022-06-11 DIAGNOSIS — J449 Chronic obstructive pulmonary disease, unspecified: Secondary | ICD-10-CM | POA: Insufficient documentation

## 2022-06-11 NOTE — Progress Notes (Signed)
Discussed with pt smoking cessation. He is currently smoking 5-10 cigarettes a day. He logically knows that smoking is harmful to him but is contemplative on actually quitting. He has made progress in cutting down. Gave pt resources, tips, reasons to quit and encouraged him to think about it. He is receptive. Encouraged him to ask questions and make a plan when he is ready. Yves Dill BS, ACSM-CEP 06/11/2022 4:18 PM

## 2022-06-11 NOTE — Progress Notes (Signed)
Daily Session Note  Patient Details  Name: Mario Proctor MRN: [id number] Date of Birth: July 08, 1940 Referring Provider:   April Manson Pulmonary Rehab Walk Test from 05/27/2022 in [facility] for Heart, Vascular, & [place]  Referring Provider [name]       Encounter Date: 06/11/2022  Check In:  Session Check In - 06/11/22 1021       Check-In   Supervising physician immediately available to respond to emergencies [facility] MD immediately available    Physician(s) Mannam    [facility]    Staff Present Janine Ores, RN, Quentin Ore, MS, ACSM-CEP, Exercise Physiologist;Randi Olen Cordial BS, ACSM-CEP, Exercise Physiologist;Other    Virtual Visit No    Medication changes reported     No    Fall or balance concerns reported    No    Tobacco Cessation No Change    Warm-up and Cool-down Performed as group-led instruction    Resistance Training Performed Yes    VAD Patient? No    PAD/SET Patient? No      Pain Assessment   Currently in Pain? No/denies    Multiple Pain Sites No             Capillary Blood Glucose: No results found for this or any previous visit (from the past 24 hour(s)).    Social History   Tobacco Use  Smoking Status Former   Packs/day: 0.50   Years: 50.00   Total pack years: 25.00   Types: Cigarettes   Quit date: 05/02/2021   Years since quitting: 1.1   Passive exposure: Past  Smokeless Tobacco Never  Tobacco Comments   currently smoking .5ppd as of 05/14/20    Goals Met:  Proper associated with RPD/PD & O2 Sat Independence with exercise equipment Exercise tolerated well No report of concerns or symptoms today Strength training completed today  Goals Unmet:  Not Applicable  Comments: Service time is from 1014 to 1140.    Dr. Rodman Pickle is Medical Director for Pulmonary Rehab at [facility].

## 2022-06-15 ENCOUNTER — Ambulatory Visit
Admission: RE | Admit: 2022-06-15 | Discharge: 2022-06-15 | Disposition: A | Discharge status: Home / Self Care | Payer: Medicare HMO | Source: Ambulatory Visit | Attending: Nephrology | Admitting: Nephrology

## 2022-06-15 DIAGNOSIS — I5022 Chronic systolic (congestive) heart failure: Secondary | ICD-10-CM

## 2022-06-15 DIAGNOSIS — N179 Acute kidney failure, unspecified: Secondary | ICD-10-CM | POA: Diagnosis not present

## 2022-06-15 DIAGNOSIS — J449 Chronic obstructive pulmonary disease, unspecified: Secondary | ICD-10-CM

## 2022-06-15 DIAGNOSIS — N281 Cyst of kidney, acquired: Secondary | ICD-10-CM

## 2022-06-16 ENCOUNTER — Encounter (HOSPITAL_COMMUNITY): Payer: Medicare HMO

## 2022-06-16 DIAGNOSIS — I10 Essential (primary) hypertension: Secondary | ICD-10-CM | POA: Diagnosis not present

## 2022-06-18 ENCOUNTER — Encounter (HOSPITAL_COMMUNITY)
Admission: RE | Admit: 2022-06-18 | Discharge: 2022-06-18 | Disposition: A | Discharge status: Home / Self Care | Payer: Medicare HMO | Source: Ambulatory Visit | Attending: Student | Admitting: Student

## 2022-06-18 DIAGNOSIS — J449 Chronic obstructive pulmonary disease, unspecified: Secondary | ICD-10-CM | POA: Diagnosis not present

## 2022-06-18 NOTE — Progress Notes (Signed)
Daily Session Note  Patient Details  Name: Mario Proctor MRN: [id number] Date of Birth: [birth year] Referring Provider:   April Manson Pulmonary Rehab Walk Test from 05/27/2022 in [facility] for Heart, Vascular, & Lung Health  Referring Provider [name]       Encounter Date: 06/18/2022  Check In:  Session Check In - 06/18/22 1202       Check-In   Supervising physician immediately available to respond to emergencies [facility] MD immediately available    Physician(s) Mannam    [facility]    Staff Present Janine Ores, RN, Quentin Ore, MS, ACSM-CEP, Exercise Physiologist;Other    Virtual Visit No    Medication changes reported     No    Fall or balance concerns reported    No    Tobacco Cessation No Change    Warm-up and Cool-down Performed as group-led instruction    Resistance Training Performed Yes    VAD Patient? No    PAD/SET Patient? No      Pain Assessment   Currently in Pain? No/denies    Multiple Pain Sites No             Capillary Blood Glucose: No results found for this or any previous visit (from the past 24 hour(s)).    Social History   Tobacco Use  Smoking Status Every Day   Packs/day: 0.25   Years: 50.00   Total pack years: 12.50   Types: Cigarettes   Passive exposure: Current  Smokeless Tobacco Never  Tobacco Comments   1/4-1/2 ppd currently    Goals Met:  Proper associated with RPD/PD & O2 Sat Independence with exercise equipment Exercise tolerated well No report of concerns or symptoms today Strength training completed today  Goals Unmet:  Not Applicable  Comments: Service time is from 1012 to 1140.    Dr. Rodman Pickle is Medical Director for Pulmonary Rehab at [facility].

## 2022-06-23 ENCOUNTER — Encounter (HOSPITAL_COMMUNITY)
Admission: RE | Admit: 2022-06-23 | Discharge: 2022-06-23 | Disposition: A | Discharge status: Home / Self Care | Payer: Medicare HMO | Source: Ambulatory Visit | Attending: Student | Admitting: Student

## 2022-06-23 VITALS — Wt 171.3 lb

## 2022-06-23 DIAGNOSIS — Z961 Presence of intraocular lens: Secondary | ICD-10-CM | POA: Diagnosis not present

## 2022-06-23 DIAGNOSIS — T1512XA Foreign body in conjunctival sac, left eye, initial encounter: Secondary | ICD-10-CM | POA: Diagnosis not present

## 2022-06-23 DIAGNOSIS — J449 Chronic obstructive pulmonary disease, unspecified: Secondary | ICD-10-CM | POA: Diagnosis not present

## 2022-06-23 NOTE — Progress Notes (Signed)
Daily Session Note  Patient Details  Name: Mario Proctor MRN: [id number] Date of Birth: 02-Dec-1940 Referring Provider:   April Manson Pulmonary Rehab Walk Test from 05/27/2022 in [facility] for Heart, Vascular, & [place]  Referring Provider [name]       Encounter Date: 06/23/2022  Check In:  Session Check In - 06/23/22 1522       Check-In   Supervising physician immediately available to respond to emergencies [facility] MD immediately available    Physician(s) Sharolyn Douglas    [facility]    Staff Present Janine Ores, RN, Quentin Ore, MS, ACSM-CEP, Exercise Physiologist;Carlette Wilber Oliphant, RN, BSN    Virtual Visit No    Medication changes reported     No    Fall or balance concerns reported    No    Tobacco Cessation No Change    Warm-up and Cool-down Performed as group-led instruction    Resistance Training Performed Yes    VAD Patient? No    PAD/SET Patient? No      Pain Assessment   Currently in Pain? No/denies    Multiple Pain Sites No             Capillary Blood Glucose: No results found for this or any previous visit (from the past 24 hour(s)).   Exercise Prescription Changes - 06/23/22 1500       Response to Exercise   Blood Pressure (Admit) 132/74    Blood Pressure (Exercise) 140/80    Blood Pressure (Exit) 118/64    Heart Rate (Admit) 77 bpm    Heart Rate (Exercise) 96 bpm    Heart Rate (Exit) 75 bpm    Oxygen Saturation (Admit) 94 %    Oxygen Saturation (Exercise) 93 %    Oxygen Saturation (Exit) 97 %    Rating of Perceived Exertion (Exercise) 13    Perceived Dyspnea (Exercise) 3    Duration Continue with 30 min of aerobic exercise without signs/symptoms of physical distress.    Intensity THRR unchanged      Progression   Progression Continue to progress workloads to maintain intensity without signs/symptoms of physical distress.      Resistance Training   Training Prescription Yes    Weight red  bands    Reps 10-15    Time 10 Minutes      Oxygen   Oxygen Continuous    Liters 2      Arm Ergometer   Level 2    Watts 6    Minutes 15      Track   Laps 9    Minutes 15    METs 2.39      Oxygen   Maintain Oxygen Saturation 88% or higher             Social History   Tobacco Use  Smoking Status Every Day   Packs/day: 0.25   Years: 50.00   Total pack years: 12.50   Types: Cigarettes   Passive exposure: Current  Smokeless Tobacco Never  Tobacco Comments   1/4-1/2 ppd currently    Goals Met:  Proper associated with RPD/PD & O2 Sat Exercise tolerated well No report of concerns or symptoms today Strength training completed today  Goals Unmet:  Not Applicable  Comments: Service time is from 1323 to 1450.    Dr. Rodman Pickle is Medical Director for Pulmonary Rehab at [facility].

## 2022-06-24 ENCOUNTER — Ambulatory Visit (INDEPENDENT_AMBULATORY_CARE_PROVIDER_SITE_OTHER): Payer: Medicare HMO | Admitting: Nurse Practitioner

## 2022-06-24 ENCOUNTER — Encounter: Payer: Self-pay | Admitting: Nurse Practitioner

## 2022-06-24 VITALS — BP 133/83 | HR 81 | Ht 71.0 in | Wt 171.4 lb

## 2022-06-24 DIAGNOSIS — I4819 Other persistent atrial fibrillation: Secondary | ICD-10-CM | POA: Diagnosis not present

## 2022-06-24 DIAGNOSIS — L209 Atopic dermatitis, unspecified: Secondary | ICD-10-CM | POA: Diagnosis not present

## 2022-06-24 DIAGNOSIS — J449 Chronic obstructive pulmonary disease, unspecified: Secondary | ICD-10-CM | POA: Diagnosis not present

## 2022-06-24 DIAGNOSIS — L308 Other specified dermatitis: Secondary | ICD-10-CM | POA: Insufficient documentation

## 2022-06-24 DIAGNOSIS — Z7689 Persons encountering health services in other specified circumstances: Secondary | ICD-10-CM

## 2022-06-24 DIAGNOSIS — I5042 Chronic combined systolic (congestive) and diastolic (congestive) heart failure: Secondary | ICD-10-CM

## 2022-06-24 MED ORDER — CLOBETASOL PROPIONATE 0.05 % EX CREA: 1.0000 | TOPICAL_CREAM | Freq: Two times a day (BID) | CUTANEOUS | 3 refills | Status: DC | PRN

## 2022-06-24 MED ORDER — HYDROXYZINE PAMOATE 25 MG PO CAPS: ORAL_CAPSULE | ORAL | 3 refills | Status: DC

## 2022-06-24 NOTE — Progress Notes (Signed)
Pulmonary Individual Treatment Plan  Patient Details  Name: Mario Proctor MRN: [id number] Date of Birth: 04-24-1941 Referring Provider:   April Manson Pulmonary Rehab Walk Test from 05/27/2022 in [facility] [place]  Referring Provider [name]       Initial Encounter Date:  Flowsheet Row Pulmonary Rehab Walk Test from 05/27/2022 in [facility]  Date 05/27/22       Visit Diagnosis: Stage 2 moderate COPD by GOLD classification ([place])  Patient's Home Medications on Admission:   Current Outpatient Medications:    albuterol (ACCUNEB) 1.25 MG/3ML nebulizer solution, Take 3 mLs (1.25 mg total) by nebulization every 4 (four) hours as needed for wheezing or shortness of breath., Disp: 75 mL, Rfl: 3   albuterol (VENTOLIN HFA) 108 (90 Base) MCG/ACT inhaler, TAKE 2 PUFFS BY MOUTH EVERY 6 HOURS AS NEEDED FOR WHEEZE OR SHORTNESS OF BREATH, Disp: 18 each, Rfl: 5   amiodarone (PACERONE) 100 MG tablet, Take 100 mg by mouth daily. (Patient not taking: Reported on 06/24/2022), Disp: , Rfl:    diphenhydrAMINE (BENADRYL) 25 MG tablet, Take 25 mg by mouth every 6 (six) hours as needed. (Patient not taking: Reported on 06/24/2022), Disp: , Rfl:    doxycycline (VIBRA-TABS) 100 MG tablet, Take 1 tablet (100 mg total) by mouth 2 (two) times daily. (Patient not taking: Reported on 06/24/2022), Disp: 14 tablet, Rfl: 0   Fluticasone-Umeclidin-Vilant (TRELEGY ELLIPTA) 100-62.5-25 MCG/ACT AEPB, INHALE 1 PUFF BY MOUTH EVERY DAY, Disp: 60 each, Rfl: 5   Fluticasone-Umeclidin-Vilant (TRELEGY ELLIPTA) 100-62.5-25 MCG/ACT AEPB, Inhale 1 puff into the lungs daily., Disp: 2 each, Rfl: 0   lisinopril (ZESTRIL) 10 MG tablet, TAKE 1 TABLET BY MOUTH EVERYDAY AT BEDTIME, Disp: 90 tablet, Rfl: 1   metoprolol succinate (TOPROL-XL) 25 MG 24 hr tablet, Take 1 tablet (25 mg total) by mouth daily., Disp: 90 tablet, Rfl: 0    rosuvastatin (CRESTOR) 5 MG tablet, TAKE 1 TABLET BY MOUTH EVERY DAY, Disp: 90 tablet, Rfl: 3   Spacer/Aero-Holding Chambers DEVI, Use with inhaler, Disp: 1 each, Rfl: 2   triamcinolone cream (KENALOG) 0.1 %, Apply 1 application topically 2 (two) times daily., Disp: , Rfl:    XARELTO 20 MG TABS tablet, TAKE 1 TABLET BY MOUTH EVERY DAY WITH SUPPER, Disp: 90 tablet, Rfl: 3  Current Facility-Administered Medications:    ipratropium-albuterol (DUONEB) 0.5-2.5 (3) MG/3ML nebulizer solution 3 mL, 3 mL, Nebulization, Once, Martyn Ehrich, NP  Past Medical History: Past Medical History:  Diagnosis Date   A-fib [facility])    Allergy    CHF (congestive heart failure) ([place])    Dupuytren contracture    right sm finger   Myocardial infarct ([place]) 2019   Myocardial infarction ([place])    Small bowel obstruction (HCC)     Tobacco Use: Social History   Tobacco Use  Smoking Status Every Day   Packs/day: 0.25   Years: 50.00   Total pack years: 12.50   Types: Cigarettes   Passive exposure: Current  Smokeless Tobacco Never  Tobacco Comments   1/4-1/2 ppd currently    Labs: Review Flowsheet       Latest Ref Rng & Units 09/01/2018 06/12/2020  Labs for ITP Cardiac and Pulmonary Rehab  Cholestrol 100 - 199 mg/dL 140  139   LDL (calc) 0 - 99 mg/dL 67  60   HDL-C >39 mg/dL 62  66   Trlycerides 0 -  149 mg/dL 54  61     Capillary Blood Glucose: No results found for: "GLUCAP"   Pulmonary Assessment Scores:  Pulmonary Assessment Scores     Row Name 05/27/22 1050         ADL UCSD   ADL Phase Entry     SOB Score total 49       CAT Score   CAT Score 15       mMRC Score   mMRC Score 4             UCSD: Self-administered rating of dyspnea associated with activities of daily living (ADLs) 6-point scale (0 = "not at all" to 5 = "maximal or unable to do because of breathlessness")  Scoring Scores range from 0 to 120.  Minimally important difference is 5 units  CAT: CAT can identify  the health impairment of COPD patients and is better correlated with disease progression.  CAT has a scoring range of zero to 40. The CAT score is classified into four groups of low (less than 10), medium (10 - 20), high (21-30) and very high (31-40) based on the impact level of disease on health status. A CAT score over 10 suggests significant symptoms.  A worsening CAT score could be explained by an exacerbation, poor medication adherence, poor inhaler technique, or progression of COPD or comorbid conditions.  CAT MCID is 2 points  mMRC: mMRC (Modified Medical Research Council) Dyspnea Scale is used to assess the degree of baseline functional disability in patients of respiratory disease due to dyspnea. No minimal important difference is established. A decrease in score of 1 point or greater is considered a positive change.   Pulmonary Function Assessment:  Pulmonary Function Assessment - 05/27/22 1015       Breath   Bilateral Breath Sounds Wheezes;Expiratory    Shortness of Breath Yes;Limiting activity             Exercise Target Goals: Exercise Program Goal: Individual exercise prescription set using results from initial 6 min walk test and THRR while considering  patient's activity barriers and safety.   Exercise Prescription Goal: Initial exercise prescription builds to 30-45 minutes a day of aerobic activity, 2-3 days per week.  Home exercise guidelines will be given to patient during program as part of exercise prescription that the participant will acknowledge.  Activity Barriers & Risk Stratification:  Activity Barriers & Cardiac Risk Stratification - 05/27/22 1046       Activity Barriers & Cardiac Risk Stratification   Activity Barriers Arthritis;Deconditioning;Muscular Weakness;Shortness of Breath;History of Falls             6 Minute Walk:  6 Minute Walk     Row Name 05/27/22 1148         6 Minute Walk   Phase Initial     Distance 860 feet     Walk  Time 6 minutes     # of Rest Breaks 2  1:45-2:38, 3:24-4:45     MPH 1.63     METS 1.95     RPE 13     Perceived Dyspnea  1     VO2 Peak 6.83     Symptoms No     Resting HR 79 bpm     Resting BP 158/90     Resting Oxygen Saturation  96 %     Exercise Oxygen Saturation  during 6 min walk 85 %     Max Ex. HR 87 bpm  Max Ex. BP 164/84     2 Minute Post BP 140/80       Interval HR   1 Minute HR 84     2 Minute HR 84     3 Minute HR 86     4 Minute HR 8     5 Minute HR 85     6 Minute HR 80     2 Minute Post HR 78     Interval Heart Rate? Yes       Interval Oxygen   Interval Oxygen? Yes     Baseline Oxygen Saturation % 96 %     1 Minute Oxygen Saturation % 95 %     1 Minute Liters of Oxygen 0 L     2 Minute Oxygen Saturation % 88 %     2 Minute Liters of Oxygen 0 L     3 Minute Oxygen Saturation % 89 %     3 Minute Liters of Oxygen 0 L     4 Minute Oxygen Saturation % 88 %     4 Minute Liters of Oxygen 0 L     5 Minute Oxygen Saturation % 89 %  85 @ 5:47     5 Minute Liters of Oxygen 0 L  Increased to 1L     6 Minute Oxygen Saturation % 95 %     6 Minute Liters of Oxygen 0 L     2 Minute Post Oxygen Saturation % 96 %     2 Minute Post Liters of Oxygen 0 L              Oxygen Initial Assessment:  Oxygen Initial Assessment - 05/27/22 1047       Home Oxygen   Home Oxygen Device Home Concentrator;E-Tanks    Sleep Oxygen Prescription Continuous    Liters per minute 2    Home Exercise Oxygen Prescription None    Home Resting Oxygen Prescription None    Compliance with Home Oxygen Use No      Initial 6 min Walk   Oxygen Used None      Program Oxygen Prescription   Program Oxygen Prescription None      Intervention   Short Term Goals To learn and understand importance of maintaining oxygen saturations>88%;To learn and demonstrate proper use of respiratory medications;To learn and understand importance of monitoring SPO2 with pulse oximeter and demonstrate  accurate use of the pulse oximeter.;To learn and demonstrate proper pursed lip breathing techniques or other breathing techniques. ;To learn and exhibit compliance with exercise, home and travel O2 prescription    Long  Term Goals Exhibits compliance with exercise, home  and travel O2 prescription;Verbalizes importance of monitoring SPO2 with pulse oximeter and return demonstration;Maintenance of O2 saturations>88%;Exhibits proper breathing techniques, such as pursed lip breathing or other method taught during program session;Compliance with respiratory medication;Demonstrates proper use of MDI's             Oxygen Re-Evaluation:  Oxygen Re-Evaluation     Row Name 06/17/22 0905             Program Oxygen Prescription   Program Oxygen Prescription Continuous       Liters per minute 2       Comments O2 sat 90-96% on 2L         Home Oxygen   Home Oxygen Device Home Concentrator;E-Tanks       Sleep Oxygen Prescription Continuous  Liters per minute 2       Home Exercise Oxygen Prescription None       Home Resting Oxygen Prescription None       Compliance with Home Oxygen Use No         Goals/Expected Outcomes   Short Term Goals To learn and understand importance of maintaining oxygen saturations>88%;To learn and demonstrate proper use of respiratory medications;To learn and understand importance of monitoring SPO2 with pulse oximeter and demonstrate accurate use of the pulse oximeter.;To learn and demonstrate proper pursed lip breathing techniques or other breathing techniques. ;To learn and exhibit compliance with exercise, home and travel O2 prescription       Long  Term Goals Exhibits compliance with exercise, home  and travel O2 prescription;Verbalizes importance of monitoring SPO2 with pulse oximeter and return demonstration;Maintenance of O2 saturations>88%;Exhibits proper breathing techniques, such as pursed lip breathing or other method taught during program  session;Compliance with respiratory medication;Demonstrates proper use of MDI's       Goals/Expected Outcomes Compliance and understanding of oxygen saturation monitoring and breathing techniques to decrease shortness of breath.                Oxygen Discharge (Final Oxygen Re-Evaluation):  Oxygen Re-Evaluation - 06/17/22 0905       Program Oxygen Prescription   Program Oxygen Prescription Continuous    Liters per minute 2    Comments O2 sat 90-96% on 2L      Home Oxygen   Home Oxygen Device Home Concentrator;E-Tanks    Sleep Oxygen Prescription Continuous    Liters per minute 2    Home Exercise Oxygen Prescription None    Home Resting Oxygen Prescription None    Compliance with Home Oxygen Use No      Goals/Expected Outcomes   Short Term Goals To learn and understand importance of maintaining oxygen saturations>88%;To learn and demonstrate proper use of respiratory medications;To learn and understand importance of monitoring SPO2 with pulse oximeter and demonstrate accurate use of the pulse oximeter.;To learn and demonstrate proper pursed lip breathing techniques or other breathing techniques. ;To learn and exhibit compliance with exercise, home and travel O2 prescription    Long  Term Goals Exhibits compliance with exercise, home  and travel O2 prescription;Verbalizes importance of monitoring SPO2 with pulse oximeter and return demonstration;Maintenance of O2 saturations>88%;Exhibits proper breathing techniques, such as pursed lip breathing or other method taught during program session;Compliance with respiratory medication;Demonstrates proper use of MDI's    Goals/Expected Outcomes Compliance and understanding of oxygen saturation monitoring and breathing techniques to decrease shortness of breath.             Initial Exercise Prescription:  Initial Exercise Prescription - 05/27/22 1100       Date of Initial Exercise RX and Referring Provider   Date 05/27/22    Referring  Provider [name]    Expected Discharge Date 07/30/22      Oxygen   Oxygen Continuous    Liters 1    Maintain Oxygen Saturation 88% or higher      Arm Ergometer   Level 1    Watts 5    RPM 20    Minutes 15      Track   Minutes 15    METs 1.95      Prescription Details   Frequency (times per week) 2    Duration Progress to 30 minutes of continuous aerobic without signs/symptoms of physical distress      Intensity  THRR 40-80% of Max Heartrate 56-112    Ratings of Perceived Exertion 11-13    Perceived Dyspnea 0-4      Progression   Progression Continue progressive overload as per policy without signs/symptoms or physical distress.      Resistance Training   Training Prescription Yes    Weight red bands    Reps 10-15             Perform Capillary Blood Glucose checks as needed.  Exercise Prescription Changes:   Exercise Prescription Changes     Row Name 06/09/22 1200 06/23/22 1500           Response to Exercise   Blood Pressure (Admit) 126/70 132/74      Blood Pressure (Exercise) 130/78 140/80      Blood Pressure (Exit) 128/68 118/64      Heart Rate (Admit) 71 bpm 77 bpm      Heart Rate (Exercise) 83 bpm 96 bpm      Heart Rate (Exit) 76 bpm 75 bpm      Oxygen Saturation (Admit) 97 % 94 %      Oxygen Saturation (Exercise) 97 % 93 %      Oxygen Saturation (Exit) 98 % 97 %      Rating of Perceived Exertion (Exercise) 14 13      Perceived Dyspnea (Exercise) 3 3      Duration Continue with 30 min of aerobic exercise without signs/symptoms of physical distress. Continue with 30 min of aerobic exercise without signs/symptoms of physical distress.      Intensity THRR unchanged THRR unchanged        Progression   Progression Continue to progress workloads to maintain intensity without signs/symptoms of physical distress. Continue to progress workloads to maintain intensity without signs/symptoms of physical distress.        Resistance Training   Training  Prescription Yes Yes      Weight red bands red bands      Reps 10-15 10-15      Time 10 Minutes 10 Minutes        Oxygen   Oxygen Continuous Continuous      Liters 2 2        Arm Ergometer   Level 1 2      Watts 1 6      RPM 47 --      Minutes 15 15        Track   Laps -- 9      Minutes 15 15      METs 1.92 2.39        Oxygen   Maintain Oxygen Saturation 88% or higher 88% or higher               Exercise Comments:   Exercise Comments     Row Name 06/02/22 1207           Exercise Comments Pt completed first day of exercise. He exercised on the arm ergometer at level 1 for 15 min, 1 watts, RPM 44. He then walked the track for 17.5 min for 6 laps, METs 1.69. Pt had significant SOB and hypoxia on 1L. Will watch SpO2 continuously next session. He was able to perform warm up and cooldown with verbal cues. Performed squats. Pt fatigued, tolerated exercise fair today. Discussed METs/RPMs. Will monitor for progression.                Exercise Goals and Review:   Exercise Goals  [place] Name 05/27/22 1053 06/17/22 0849           Exercise Goals   Increase Physical Activity Yes Yes      Intervention Provide advice, education, support and counseling about physical activity/exercise needs.;Develop an individualized exercise prescription for aerobic and resistive training based on initial evaluation findings, risk stratification, comorbidities and participant's personal goals. Provide advice, education, support and counseling about physical activity/exercise needs.;Develop an individualized exercise prescription for aerobic and resistive training based on initial evaluation findings, risk stratification, comorbidities and participant's personal goals.      Expected Outcomes Short Term: Attend rehab on a regular basis to increase amount of physical activity.;Long Term: Add in home exercise to make exercise part of routine and to increase amount of physical activity.;Long  Term: Exercising regularly at least 3-5 days a week. Short Term: Attend rehab on a regular basis to increase amount of physical activity.;Long Term: Add in home exercise to make exercise part of routine and to increase amount of physical activity.;Long Term: Exercising regularly at least 3-5 days a week.      Increase Strength and Stamina Yes Yes      Intervention Provide advice, education, support and counseling about physical activity/exercise needs.;Develop an individualized exercise prescription for aerobic and resistive training based on initial evaluation findings, risk stratification, comorbidities and participant's personal goals. Provide advice, education, support and counseling about physical activity/exercise needs.;Develop an individualized exercise prescription for aerobic and resistive training based on initial evaluation findings, risk stratification, comorbidities and participant's personal goals.      Expected Outcomes Short Term: Increase workloads from initial exercise prescription for resistance, speed, and METs.;Short Term: Perform resistance training exercises routinely during rehab and add in resistance training at home;Long Term: Improve cardiorespiratory fitness, muscular endurance and strength as measured by increased METs and functional capacity (6MWT) Short Term: Increase workloads from initial exercise prescription for resistance, speed, and METs.;Short Term: Perform resistance training exercises routinely during rehab and add in resistance training at home;Long Term: Improve cardiorespiratory fitness, muscular endurance and strength as measured by increased METs and functional capacity (6MWT)      Able to understand and use rate of perceived exertion (RPE) scale Yes Yes      Intervention Provide education and explanation on how to use RPE scale Provide education and explanation on how to use RPE scale      Expected Outcomes Short Term: Able to use RPE daily in rehab to express  subjective intensity level;Long Term:  Able to use RPE to guide intensity level when exercising independently Short Term: Able to use RPE daily in rehab to express subjective intensity level;Long Term:  Able to use RPE to guide intensity level when exercising independently      Able to understand and use Dyspnea scale Yes Yes      Intervention Provide education and explanation on how to use Dyspnea scale Provide education and explanation on how to use Dyspnea scale      Expected Outcomes Short Term: Able to use Dyspnea scale daily in rehab to express subjective sense of shortness of breath during exertion;Long Term: Able to use Dyspnea scale to guide intensity level when exercising independently Short Term: Able to use Dyspnea scale daily in rehab to express subjective sense of shortness of breath during exertion;Long Term: Able to use Dyspnea scale to guide intensity level when exercising independently      Knowledge and understanding of Target Heart Rate Range (THRR) Yes Yes      Intervention  Provide education and explanation of THRR including how the numbers were predicted and where they are located for reference Provide education and explanation of THRR including how the numbers were predicted and where they are located for reference      Expected Outcomes Short Term: Able to state/look up THRR;Long Term: Able to use THRR to govern intensity when exercising independently;Short Term: Able to use daily as guideline for intensity in rehab Short Term: Able to state/look up THRR;Long Term: Able to use THRR to govern intensity when exercising independently;Short Term: Able to use daily as guideline for intensity in rehab      Understanding of Exercise Prescription Yes Yes      Intervention Provide education, explanation, and written materials on patient's individual exercise prescription Provide education, explanation, and written materials on patient's individual exercise prescription      Expected Outcomes  Short Term: Able to explain program exercise prescription;Long Term: Able to explain home exercise prescription to exercise independently Short Term: Able to explain program exercise prescription;Long Term: Able to explain home exercise prescription to exercise independently               Exercise Goals Re-Evaluation :  Exercise Goals Re-Evaluation     Row Name 06/17/22 0849             Exercise Goal Re-Evaluation   Exercise Goals Review Increase Physical Activity;Able to understand and use Dyspnea scale;Understanding of Exercise Prescription;Increase Strength and Stamina;Knowledge and understanding of Target Heart Rate Range (THRR);Able to understand and use rate of perceived exertion (RPE) scale       Comments Gwynn has completed 4 exercise sessions. He exercises for 15 min on arm erogmeter and track. Gwynn averages 50 rpms at level 1.5 on the arm ergometer and 2.08 METs on the track. He performs the warmup and cooldown stnading without limitations. Gwynn has increased her workload on the arm ergometer. His track METs have remained relatively the same. It is too soon to notate any discernable progressions. Will continue to monitor and progress as able.       Expected Outcomes Through exercise at rehab and home, the patient will decrease shortness of breath with daily activities and feel confident in carrying out an exercise regimen at home.                Discharge Exercise Prescription (Final Exercise Prescription Changes):  Exercise Prescription Changes - 06/23/22 1500       Response to Exercise   Blood Pressure (Admit) 132/74    Blood Pressure (Exercise) 140/80    Blood Pressure (Exit) 118/64    Heart Rate (Admit) 77 bpm    Heart Rate (Exercise) 96 bpm    Heart Rate (Exit) 75 bpm    Oxygen Saturation (Admit) 94 %    Oxygen Saturation (Exercise) 93 %    Oxygen Saturation (Exit) 97 %    Rating of Perceived Exertion (Exercise) 13    Perceived Dyspnea (Exercise) 3     Duration Continue with 30 min of aerobic exercise without signs/symptoms of physical distress.    Intensity THRR unchanged      Progression   Progression Continue to progress workloads to maintain intensity without signs/symptoms of physical distress.      Resistance Training   Training Prescription Yes    Weight red bands    Reps 10-15    Time 10 Minutes      Oxygen   Oxygen Continuous    Liters 2  Arm Ergometer   Level 2    Watts 6    Minutes 15      Track   Laps 9    Minutes 15    METs 2.39      Oxygen   Maintain Oxygen Saturation 88% or higher             Nutrition:  Target Goals: Understanding of nutrition guidelines, daily intake of sodium <1533m, cholesterol <2052m calories 30% from fat and 7% or less from saturated fats, daily to have 5 or more servings of fruits and vegetables.  Biometrics:  Pre Biometrics - 05/27/22 1029       Pre Biometrics   Grip Strength 18 kg              Nutrition Therapy Plan and Nutrition Goals:  Nutrition Therapy & Goals - 06/02/22 1602       Nutrition Therapy   Diet Heart Healthy Diet    Drug/Food Interactions Statins/Certain Fruits      Personal Nutrition Goals   Nutrition Goal Patient to improve diet quality by using the plate method as a guide for meal planning to include lean protein/plant protein, fruits, vegetables, whole grains, and low fat dairy as part of a well balanced diet    Personal Goal #2 Patient to limit sodium to <150016mer day    Comments Gwynn reports regular eating patters and normal appetite. He eats 2-3 meals daily and enjoys a wide variety of foods. His wife is a good support. Gwynn will benefit from participation in pulmonary rehab for nutrition, exercise, and lifestyle modification.      Intervention Plan   Intervention Prescribe, educate and counsel regarding individualized specific dietary modifications aiming towards targeted core components such as weight, hypertension, lipid  management, diabetes, heart failure and other comorbidities.;Nutrition handout(s) given to patient.    Expected Outcomes Short Term Goal: Understand basic principles of dietary content, such as calories, fat, sodium, cholesterol and nutrients.;Long Term Goal: Adherence to prescribed nutrition plan.             Nutrition Assessments:  Nutrition Assessments - 06/03/22 1017       Rate Your Plate Scores   Pre Score 55            MEDIFICTS Score Key: ?70 Need to make dietary changes  40-70 Heart Healthy Diet ? 40 Therapeutic Level Cholesterol Diet  Flowsheet Row PULMONARY REHAB CHRONIC OBSTRUCTIVE PULMONARY DISEASE [facility]  Picture Your Plate Total Score on Admission 55      Picture Your Plate Scores: <[id number] dietary pattern with much room for improvement. 41-50 Dietary pattern unlikely to meet recommendations for good health and room for improvement. 51-60 More healthful dietary pattern, with some room for improvement.  >60 Healthy dietary pattern, although there may be some specific behaviors that could be improved.    Nutrition Goals Re-Evaluation:  Nutrition Goals Re-Evaluation     [place] 06/02/22 1602             Goals   Current Weight 174 lb 6.1 oz (79.1 kg)       Comment GFR 46.4- he was referred to nephrology in June 2023; lipids WNL       Expected Outcome Gwynn reports regular eating patters and normal appetite. He eats 2-3 meals daily and enjoys a wide variety of foods. His wife is a good support. Gwynn will benefit from participation  in pulmonary rehab for nutrition, exercise, and lifestyle modification.                Nutrition Goals Discharge (Final Nutrition Goals Re-Evaluation):  Nutrition Goals Re-Evaluation - 06/02/22 1602       Goals   Current Weight 174 lb 6.1 oz (79.1 kg)    Comment GFR 46.4- he was referred to nephrology in June 2023; lipids WNL     Expected Outcome Gwynn reports regular eating patters and normal appetite. He eats 2-3 meals daily and enjoys a wide variety of foods. His wife is a good support. Gwynn will benefit from participation in pulmonary rehab for nutrition, exercise, and lifestyle modification.             Psychosocial: Target Goals: Acknowledge presence or absence of significant depression and/or stress, maximize coping skills, provide positive support system. Participant is able to verbalize types and ability to use techniques and skills needed for reducing stress and depression.  Initial Review & Psychosocial Screening:  Initial Psych Review & Screening - 05/27/22 1040       Initial Review   Current issues with None Identified      Family Dynamics   Good Support System? Yes    Comments wife      Barriers   Psychosocial barriers to participate in program There are no identifiable barriers or psychosocial needs.      Screening Interventions   Interventions Encouraged to exercise             Quality of Life Scores:  Scores of 19 and below usually indicate a poorer quality of life in these areas.  A difference of  2-3 points is a clinically meaningful difference.  A difference of 2-3 points in the total score of the Quality of Life Index has been associated with significant improvement in overall quality of life, self-image, physical symptoms, and general health in studies assessing change in quality of life.  PHQ-9: Review Flowsheet       05/27/2022  Depression screen PHQ 2/9  Decreased Interest 0  Down, Depressed, Hopeless 0  PHQ - 2 Score 0  Altered sleeping 0  Tired, decreased energy 1  Change in appetite 0  Feeling bad or failure about yourself  0  Trouble concentrating 0  Moving slowly or fidgety/restless 0  Suicidal thoughts 0  PHQ-9 Score 1  Difficult doing work/chores Not difficult at all   Interpretation of Total Score  Total Score Depression Severity:  1-4 = Minimal  depression, 5-9 = Mild depression, 10-14 = Moderate depression, 15-19 = Moderately severe depression, 20-27 = Severe depression   Psychosocial Evaluation and Intervention:  Psychosocial Evaluation - 05/27/22 1042       Psychosocial Evaluation & Interventions   Interventions Stress management education;Relaxation education;Encouraged to exercise with the program and follow exercise prescription    Comments Pt denies psychosocial concerns.    Expected Outcomes For pt to participate in rehab free of psychosocial concerns    Continue Psychosocial Services  No Follow up required             Psychosocial Re-Evaluation:  Psychosocial Re-Evaluation     [place] Name 06/19/22 1000             Psychosocial Re-Evaluation   Current issues with None Identified       Comments Gwynn currently denies any psychosocial barriers or concerns. We have discussed with Gwynn the benefits of quitting smoking, but he is not ready to fully commit.  We look forward to helping Gwynn achieve his goals and will continue to assess him for any psychosocial needs that may arise.       Expected Outcomes For Gwynn to continue to attend PR without any psychosocial barriers or concerns.       Interventions Encouraged to attend Pulmonary Rehabilitation for the exercise       Continue Psychosocial Services  No Follow up required                Psychosocial Discharge (Final Psychosocial Re-Evaluation):  Psychosocial Re-Evaluation - 06/19/22 1000       Psychosocial Re-Evaluation   Current issues with None Identified    Comments Gwynn currently denies any psychosocial barriers or concerns. We have discussed with Gwynn the benefits of quitting smoking, but he is not ready to fully commit. We look forward to helping Gwynn achieve his goals and will continue to assess him for any psychosocial needs that may arise.    Expected Outcomes For Gwynn to continue to attend PR without any psychosocial barriers or concerns.     Interventions Encouraged to attend Pulmonary Rehabilitation for the exercise    Continue Psychosocial Services  No Follow up required             Education: Education Goals: Education classes will be provided on a weekly basis, covering required topics. Participant will state understanding/return demonstration of topics presented.  Learning Barriers/Preferences:  Learning Barriers/Preferences - 05/27/22 1044       Learning Barriers/Preferences   Learning Barriers Hearing   had hearing aids   Learning Preferences None             Education Topics: Introduction to Pulmonary Rehab Group instruction provided by PowerPoint, verbal discussion, and written material to support subject matter. Instructor reviews what Pulmonary Rehab is, the purpose of the program, and how patients are referred.     Know Your Numbers Group instruction that is supported by a PowerPoint presentation. Instructor discusses importance of knowing and understanding resting, exercise, and post-exercise oxygen saturation, heart rate, and blood pressure. Oxygen saturation, heart rate, blood pressure, rating of perceived exertion, and dyspnea are reviewed along with a normal range for these values.    Exercise for the Pulmonary Patient Group instruction that is supported by a PowerPoint presentation. Instructor discusses benefits of exercise, core components of exercise, frequency, duration, and intensity of an exercise routine, importance of utilizing pulse oximetry during exercise, safety while exercising, and options of places to exercise outside of rehab.    MET Level  Group instruction provided by PowerPoint, verbal discussion, and written material to support subject matter. Instructor reviews what METs are and how to increase METs.    Pulmonary Medications Verbally interactive group education provided by instructor with focus on inhaled medications and proper administration.   Anatomy and Physiology  of the Respiratory System Group instruction provided by PowerPoint, verbal discussion, and written material to support subject matter. Instructor reviews respiratory cycle and anatomical components of the respiratory system and their functions. Instructor also reviews differences in obstructive and restrictive respiratory diseases with examples of each.    Oxygen Safety Group instruction provided by PowerPoint, verbal discussion, and written material to support subject matter. There is an overview of "What is Oxygen" and "Why do we need it".  Instructor also reviews how to create a safe environment for oxygen use, the importance of using oxygen as prescribed, and the risks of noncompliance. There is a brief discussion on traveling with oxygen  and resources the patient may utilize. Flowsheet Row PULMONARY REHAB CHRONIC OBSTRUCTIVE PULMONARY DISEASE from 06/04/2022 in [facility] for Heart, Vascular, & Lung Health  Date 06/04/22  Educator EP  Instruction Review Code 1- Verbalizes Understanding       Oxygen Use Group instruction provided by PowerPoint, verbal discussion, and written material to discuss how supplemental oxygen is prescribed and different types of oxygen supply systems. Resources for more information are provided.  Flowsheet Row PULMONARY REHAB CHRONIC OBSTRUCTIVE PULMONARY DISEASE from 06/11/2022 in [facility] for Heart, Vascular, & Lung Health  Date 06/11/22  Educator EP  Instruction Review Code 1- Verbalizes Understanding       Breathing Techniques Group instruction that is supported by demonstration and informational handouts. Instructor discusses the benefits of pursed lip and diaphragmatic breathing and detailed demonstration on how to perform both.  Flowsheet Row PULMONARY REHAB CHRONIC OBSTRUCTIVE PULMONARY DISEASE from 06/18/2022 in [facility] for Heart, Vascular, & Lung Health  Date 06/18/22   Educator EP  Instruction Review Code 1- Verbalizes Understanding        Risk Factor Reduction Group instruction that is supported by a PowerPoint presentation. Instructor discusses the definition of a risk factor, different risk factors for pulmonary disease, and how the heart and lungs work together.   MD Day A group question and answer session with a medical doctor that allows participants to ask questions that relate to their pulmonary disease state.   Nutrition for the Pulmonary Patient Group instruction provided by PowerPoint slides, verbal discussion, and written materials to support subject matter. The instructor gives an explanation and review of healthy diet recommendations, which includes a discussion on weight management, recommendations for fruit and vegetable consumption, as well as protein, fluid, caffeine, fiber, sodium, sugar, and alcohol. Tips for eating when patients are short of breath are discussed.    Other Education Group or individual verbal, written, or video instructions that support the educational goals of the pulmonary rehab program. Flowsheet Row PULMONARY REHAB CHRONIC OBSTRUCTIVE PULMONARY DISEASE from 06/11/2022 in [facility] for Heart, Vascular, & [place]  Date 06/11/22  Educator EP  Instruction Review Code 1- Verbalizes Understanding        Knowledge Questionnaire Score:  Knowledge Questionnaire Score - 05/27/22 1205       Knowledge Questionnaire Score   Pre Score 14/18             Core Components/Risk Factors/Patient Goals at Admission:  Personal Goals and Risk Factors at Admission - 05/27/22 1044       Core Components/Risk Factors/Patient Goals on Admission   Improve shortness of breath with ADL's Yes    Intervention Provide education, individualized exercise plan and daily activity instruction to help decrease symptoms of SOB with activities of daily living.    Expected Outcomes Short Term: Improve  cardiorespiratory fitness to achieve a reduction of symptoms when performing ADLs;Long Term: Be able to perform more ADLs without symptoms or delay the onset of symptoms    Heart Failure Yes    Intervention Provide a combined exercise and nutrition program that is supplemented with education, support and counseling about heart failure. Directed toward relieving symptoms such as shortness of breath, decreased exercise tolerance, and extremity edema.    Expected Outcomes Improve functional capacity of life;Short term: Attendance in program 2-3 days a week with increased exercise capacity. Reported lower sodium intake. Reported increased fruit and vegetable intake. Reports medication compliance.;Short  term: Daily weights obtained and reported for increase. Utilizing diuretic protocols set by physician.             Core Components/Risk Factors/Patient Goals Review:   Goals and Risk Factor Review     [place] Name 06/19/22 1005             Core Components/Risk Factors/Patient Goals Review   Personal Goals Review Heart Failure;Improve shortness of breath with ADL's;Develop more efficient breathing techniques such as purse lipped breathing and diaphragmatic breathing and practicing self-pacing with activity.       Review Gwynn has attended only 5 sessions so far and we have already seen improvement with how far he can walk without becoming short of breath. Gwynn has also demonstrated how to use pursed lip breathing without any prompting. He is currently exercising by walking the track and on the arm ergometer. His heart failure has been stable without any complications. He is following a low sodium diet and weighing himself daily. Gwynn has attended the breathing techniques education class and is working hard at building up his endurance. Gwynn likes coming to class and states he feels a "whole lot better than I have in years".       Expected Outcomes See Admission Goals                Core  Components/Risk Factors/Patient Goals at Discharge (Final Review):   Goals and Risk Factor Review - 06/19/22 1005       Core Components/Risk Factors/Patient Goals Review   Personal Goals Review Heart Failure;Improve shortness of breath with ADL's;Develop more efficient breathing techniques such as purse lipped breathing and diaphragmatic breathing and practicing self-pacing with activity.    Review Gwynn has attended only 5 sessions so far and we have already seen improvement with how far he can walk without becoming short of breath. Gwynn has also demonstrated how to use pursed lip breathing without any prompting. He is currently exercising by walking the track and on the arm ergometer. His heart failure has been stable without any complications. He is following a low sodium diet and weighing himself daily. Gwynn has attended the breathing techniques education class and is working hard at building up his endurance. Gwynn likes coming to class and states he feels a "whole lot better than I have in years".    Expected Outcomes See Admission Goals             ITP Comments:   Comments: Dr. Rodman Pickle is Medical Director for Pulmonary Rehab at [facility].

## 2022-06-24 NOTE — Progress Notes (Signed)
New Patient Office Visit  Subjective    Patient ID: Mario Proctor, male    DOB: January 02, 1941  Age: 82 y.o. MRN: [id number]  CC:  Chief Complaint  Patient presents with   New Patient (Initial Visit)    HPI Mario Proctor presents to establish care History of COPD, CHF, and atrial fibrillation.  Followed per cardiology, pulmonology, and nephrology  -currently going to pulmonary rehab. Most recent visit was yesterday  -overdue to have medicare wellness visit.  Patient is c/o of itching, most severe on his back, arms, and neck  -this is most severe at night. Will keep him awake sometimes.  -has been prescribed triamcinolone cream 0.1% which helped some, but not very much. This was about a year ago.  -denies rash   Outpatient Encounter Medications as of 06/24/2022  Medication Sig   albuterol (ACCUNEB) 1.25 MG/3ML nebulizer solution Take 3 mLs (1.25 mg total) by nebulization every 4 (four) hours as needed for wheezing or shortness of breath.   albuterol (VENTOLIN HFA) 108 (90 Base) MCG/ACT inhaler TAKE 2 PUFFS BY MOUTH EVERY 6 HOURS AS NEEDED FOR WHEEZE OR SHORTNESS OF BREATH   clobetasol cream (TEMOVATE) [id number] % Apply 1 Application topically 2 (two) times daily as needed.   Fluticasone-Umeclidin-Vilant (TRELEGY ELLIPTA) 100-62.5-25 MCG/ACT AEPB INHALE 1 PUFF BY MOUTH EVERY DAY   Fluticasone-Umeclidin-Vilant (TRELEGY ELLIPTA) 100-62.5-25 MCG/ACT AEPB Inhale 1 puff into the lungs daily.   hydrOXYzine (VISTARIL) 25 MG capsule Take 1 capsule po QHS prn itching   lisinopril (ZESTRIL) 10 MG tablet TAKE 1 TABLET BY MOUTH EVERYDAY AT BEDTIME   metoprolol succinate (TOPROL-XL) 25 MG 24 hr tablet Take 1 tablet (25 mg total) by mouth daily.   rosuvastatin (CRESTOR) 5 MG tablet TAKE 1 TABLET BY MOUTH EVERY DAY   Spacer/Aero-Holding Chambers DEVI Use with inhaler   triamcinolone cream (KENALOG) 0.1 % Apply 1 application topically 2 (two) times daily.   XARELTO 20 MG TABS tablet TAKE 1 TABLET BY  MOUTH EVERY DAY WITH SUPPER   amiodarone (PACERONE) 100 MG tablet Take 100 mg by mouth daily. (Patient not taking: Reported on 06/24/2022)   doxycycline (VIBRA-TABS) 100 MG tablet Take 1 tablet (100 mg total) by mouth 2 (two) times daily. (Patient not taking: Reported on 06/24/2022)   [DISCONTINUED] diphenhydrAMINE (BENADRYL) 25 MG tablet Take 25 mg by mouth every 6 (six) hours as needed. (Patient not taking: Reported on 06/24/2022)   [DISCONTINUED] predniSONE (DELTASONE) 10 MG tablet Take 4 tabs for 2 days, then 3 tabs for 2 days, 2 tabs for 2 days, then 1 tab for 2 days, then stop.   Facility-Administered Encounter Medications as of 06/24/2022  Medication   ipratropium-albuterol (DUONEB) 0.5-2.5 (3) MG/3ML nebulizer solution 3 mL    Past Medical History:  Diagnosis Date   A-fib [facility])    Allergy    CHF (congestive heart failure) (HCC)    Dupuytren contracture    right sm finger   Myocardial infarct ([place]) 2019   Myocardial infarction ([place])    Small bowel obstruction [facility])     Past Surgical History:  Procedure Laterality Date   CARDIOVERSION N/A 07/30/2020   Procedure: CARDIOVERSION;  Surgeon: [name], MD;  Location: [facility];  Service: Cardiovascular;  Laterality: N/A;   COLON SURGERY     bowel blockage   DUPUYTREN CONTRACTURE RELEASE Left 2011   FASCIECTOMY Right 05/02/2015   Procedure: FASCIECTOMY RIGHT SMALL FINGER;  Surgeon: Daryll Brod, MD;  Location: [facility];  Service: Orthopedics;  Laterality: Right;  axillary block in preop   LEFT HEART CATH AND CORONARY ANGIOGRAPHY N/A 11/03/2016   Procedure: Left Heart Cath and Coronary Angiography;  Surgeon: [name], MD;  Location: [facility];  Service: Cardiovascular;  Laterality: N/A;   TONSILLECTOMY     TYMPANOPLASTY Right     Family History  Problem Relation Age of Onset   Cancer Brother        "in heart and lungs"     Social History   Socioeconomic History   Marital status: Married    Spouse  name: Not on file   Number of children: 3   Years of education: Not on file   Highest education level: Not on file  Occupational History   Not on file  Tobacco Use   Smoking status: Every Day    Packs/day: 0.25    Years: 50.00    Total pack years: 12.50    Types: Cigarettes    Passive exposure: Current   Smokeless tobacco: Never   Tobacco comments:    1/4-1/2 ppd currently  Vaping Use   Vaping Use: Never used  Substance and Sexual Activity   Alcohol use: Yes    Alcohol/week: 6.0 standard drinks of alcohol    Types: 6 Cans of beer per week    Comment: social   Drug use: No   Sexual activity: Not on file  Other Topics Concern   Not on file  Social History Narrative   ** Merged History Encounter **       Social Determinants of Health   Financial Resource Strain: Not on file  Food Insecurity: Not on file  Transportation Needs: Not on file  Physical Activity: Not on file  Stress: Not on file  Social Connections: Not on file  Intimate Partner Violence: Not on file    Review of Systems  Constitutional:  Positive for malaise/fatigue. Negative for chills and fever.  HENT:  Negative for congestion, sinus pain and sore throat.   Eyes: Negative.   Respiratory:  Positive for shortness of breath. Negative for cough and wheezing.   Cardiovascular:  Negative for chest pain, palpitations and leg swelling.  Gastrointestinal:  Negative for constipation, diarrhea, nausea and vomiting.  Genitourinary: Negative.   Musculoskeletal:  Negative for myalgias.  Skin:  Positive for itching.  Neurological:  Negative for dizziness and headaches.  Endo/Heme/Allergies:  Does not bruise/bleed easily.  Psychiatric/Behavioral:  Negative for depression. The patient is not nervous/anxious.         Objective    Today's Vitals   06/24/22 0816  BP: 133/83  Pulse: 81  SpO2: 96%  Weight: 171 lb 6.4 oz (77.7 kg)  Height: 5' 11"$  (1.803 m)   Body mass index is 23.91 kg/m.   Physical  Exam Vitals and nursing note reviewed.  Constitutional:      Appearance: Normal appearance. He is well-developed.  HENT:     Head: Normocephalic and atraumatic.  Eyes:     Pupils: Pupils are equal, round, and reactive to light.  Cardiovascular:     Rate and Rhythm: Normal rate and regular rhythm.     Pulses: Normal pulses.     Heart sounds: Normal heart sounds.  Pulmonary:     Effort: Accessory muscle usage present.     Breath sounds: Normal breath sounds.  Abdominal:     Palpations: Abdomen is soft.  Musculoskeletal:        General: Normal range of motion.  Cervical back: Normal range of motion and neck supple.  Lymphadenopathy:     Cervical: No cervical adenopathy.  Skin:    General: Skin is warm and dry.     Capillary Refill: Capillary refill takes less than 2 seconds.  Neurological:     General: No focal deficit present.     Mental Status: He is alert and oriented to person, place, and time.  Psychiatric:        Mood and Affect: Mood normal.        Behavior: Behavior normal.        Thought Content: Thought content normal.        Judgment: Judgment normal.     Assessment & Plan:  1. Atopic dermatitis, unspecified type Trial clobetasol emollient cream. Apply to effected areas BID prn  - clobetasol cream (TEMOVATE) 0.05 %; Apply 1 Application topically 2 (two) times daily as needed.  Dispense: 80 g; Refill: 3  2. Pruritic dermatitis D/c use of benadryl. Trial hydroxyzine 25 mg at bedtime as needed for itching.  - hydrOXYzine (VISTARIL) 25 MG capsule; Take 1 capsule po QHS prn itching  Dispense: 30 capsule; Refill: 3  3. Chronic combined systolic and diastolic CHF (congestive heart failure) ([place]) Continue regular visits with cardiology as scheduled   4. Persistent atrial fibrillation ([place]) Continue regular visits with cardiology as scheduled   5. COPD, group A, by GOLD 2017 classification [facility]) Continue regular visits with pulmonology as scheduled   6.  Encounter to establish care Appointment today to establish new primary care provider      Problem List Items Addressed This Visit       Cardiovascular and Mediastinum   Chronic combined systolic and diastolic CHF (congestive heart failure) (HCC)   A-fib (HCC)     Respiratory   COPD, group A, by GOLD 2017 classification ([place])     Musculoskeletal and Integument   Atopic dermatitis - Primary   Relevant Medications   clobetasol cream (TEMOVATE) 0.05 %   Pruritic dermatitis   Relevant Medications   hydrOXYzine (VISTARIL) 25 MG capsule   Other Visit Diagnoses     Encounter to establish care           Return in about 4 months (around 10/23/2022) for medicare wellness .   Ronnell Freshwater, NP

## 2022-06-25 ENCOUNTER — Encounter (HOSPITAL_COMMUNITY)
Admission: RE | Admit: 2022-06-25 | Discharge: 2022-06-25 | Disposition: A | Discharge status: Home / Self Care | Payer: Medicare HMO | Source: Ambulatory Visit | Attending: Student | Admitting: Student

## 2022-06-25 DIAGNOSIS — J449 Chronic obstructive pulmonary disease, unspecified: Secondary | ICD-10-CM | POA: Diagnosis not present

## 2022-06-25 NOTE — Progress Notes (Signed)
Daily Session Note  Patient Details  Name: Mario Proctor MRN: [id number] Date of Birth: 11-17-1940 Referring Provider:   April Manson Pulmonary Rehab Walk Test from 05/27/2022 in [facility] for Heart, Vascular, & Lung Health  Referring Provider [name]       Encounter Date: 06/25/2022  Check In:  Session Check In - 06/25/22 1039       Check-In   Supervising physician immediately available to respond to emergencies [facility] MD immediately available    Physician(s) Dr Nila Nephew    Location [facility]    Staff Present Janine Ores, RN, Quentin Ore, MS, ACSM-CEP, Exercise Physiologist;Randi Yevonne Pax, ACSM-CEP, Exercise Physiologist;Samantha [place], RD, LDN;Other    Virtual Visit No    Medication changes reported     No    Fall or balance concerns reported    No    Tobacco Cessation No Change    Warm-up and Cool-down Performed as group-led instruction    Resistance Training Performed Yes    VAD Patient? No    PAD/SET Patient? No      Pain Assessment   Currently in Pain? No/denies    Multiple Pain Sites No             Capillary Blood Glucose: No results found for this or any previous visit (from the past 24 hour(s)).    Social History   Tobacco Use  Smoking Status Every Day   Packs/day: 0.25   Years: 50.00   Total pack years: 12.50   Types: Cigarettes   Passive exposure: Current  Smokeless Tobacco Never  Tobacco Comments   1/4-1/2 ppd currently    Goals Met:  Proper associated with RPD/PD & O2 Sat Independence with exercise equipment Exercise tolerated well No report of concerns or symptoms today Strength training completed today  Goals Unmet:  Not Applicable  Comments: Service time is from 1009 to 1130.    Dr. Rodman Pickle is Medical Director for Pulmonary Rehab at [facility].

## 2022-06-26 ENCOUNTER — Other Ambulatory Visit: Payer: Self-pay | Admitting: Internal Medicine

## 2022-06-26 DIAGNOSIS — I482 Chronic atrial fibrillation, unspecified: Secondary | ICD-10-CM

## 2022-06-30 ENCOUNTER — Encounter (HOSPITAL_COMMUNITY)
Admission: RE | Admit: 2022-06-30 | Discharge: 2022-06-30 | Disposition: A | Discharge status: Home / Self Care | Payer: Medicare HMO | Source: Ambulatory Visit | Attending: Student | Admitting: Student

## 2022-06-30 ENCOUNTER — Other Ambulatory Visit: Payer: Self-pay | Admitting: Primary Care

## 2022-06-30 DIAGNOSIS — J449 Chronic obstructive pulmonary disease, unspecified: Secondary | ICD-10-CM | POA: Diagnosis not present

## 2022-06-30 NOTE — Progress Notes (Signed)
Daily Session Note  Patient Details  Name: Mario Proctor MRN: [id number] Date of Birth: 30-Jun-1940 Referring Provider:   April Manson Pulmonary Rehab Walk Test from 05/27/2022 in [facility], [facility], & [place]  Referring Provider [name]       Encounter Date: 06/30/2022  Check In:  Session Check In - 06/30/22 1153       Check-In   Supervising physician immediately available to respond to emergencies [facility] MD immediately available    Physician(s) Eric Form, NP    Location [facility]    Staff Present Janine Ores, RN, Quentin Ore, MS, ACSM-CEP, Exercise Physiologist;Randi Yevonne Pax, ACSM-CEP, Exercise Physiologist;Samantha [place], RD, LDN;Other    Virtual Visit No    Medication changes reported     No    Fall or balance concerns reported    No    Tobacco Cessation No Change    Warm-up and Cool-down Performed as group-led instruction    Resistance Training Performed Yes    VAD Patient? No    PAD/SET Patient? No      Pain Assessment   Currently in Pain? No/denies    Multiple Pain Sites No             Capillary Blood Glucose: No results found for this or any previous visit (from the past 24 hour(s)).    Social History   Tobacco Use  Smoking Status Every Day   Packs/day: 0.25   Years: 50.00   Total pack years: 12.50   Types: Cigarettes   Passive exposure: Current  Smokeless Tobacco Never  Tobacco Comments   1/4-1/2 ppd currently    Goals Met:  Independence with exercise equipment Improved SOB with ADL's Exercise tolerated well No report of concerns or symptoms today Strength training completed today  Goals Unmet:  Not Applicable  Comments: Service time is from 1020 to 1146    Dr. Rodman Pickle is Medical Director for Pulmonary Rehab at [facility].

## 2022-07-02 ENCOUNTER — Encounter (HOSPITAL_COMMUNITY)
Admission: RE | Admit: 2022-07-02 | Discharge: 2022-07-02 | Disposition: A | Discharge status: Home / Self Care | Payer: Medicare HMO | Source: Ambulatory Visit | Attending: Student | Admitting: Student

## 2022-07-02 DIAGNOSIS — J449 Chronic obstructive pulmonary disease, unspecified: Secondary | ICD-10-CM | POA: Diagnosis not present

## 2022-07-02 NOTE — Progress Notes (Signed)
Home Exercise Prescription I have reviewed a Home Exercise Prescription with Mario Proctor. Mario Proctor is not currently exercising at home. I encouraged him to start walking 1 non-rehab day/wk for 2x15 min or 30 min/day. He agreed with my recommendations. I also explained the difference between exercise and ADLs. Mario Proctor voiced understanding. He seems motivated to exercise and improve his functional capacity. We also discussed various places to walk. I mentioned to Mario Proctor that he can walk outside, at a fitness center, or around the grocery store. Mario Proctor will most likely walk around the grocery store. The patient stated that their goals were to maintain current health. We reviewed exercise guidelines, target heart rate during exercise, RPE Scale, weather conditions, endpoints for exercise, warmup and cool down. The patient is encouraged to come to me with any questions. I will continue to follow up with the patient to assist them with progression and safety.    Sheppard Plumber, MS, ACSM-CEP 07/02/2022 12:46 PM

## 2022-07-02 NOTE — Progress Notes (Signed)
Daily Session Note  Patient Details  Name: Mario Proctor MRN: [id number] Date of Birth: 09/03/1940 Referring Provider:   April Manson Pulmonary Rehab Walk Test from 05/27/2022 in [facility]  Referring Provider [name]       Encounter Date: 07/02/2022  Check In:  Session Check In - 07/02/22 1204       Check-In   Supervising physician immediately available to respond to emergencies [facility] MD immediately available    Physician(s) Dr Vaughan Browner    [facility]    Staff Present Janine Ores, RN, BSN;Randi Olen Cordial BS, ACSM-CEP, Exercise Physiologist;Samantha [place], RD, LDN;Other    Virtual Visit No    Medication changes reported     No    Fall or balance concerns reported    No    Tobacco Cessation No Change    Warm-up and Cool-down Performed as group-led instruction    Resistance Training Performed Yes    VAD Patient? No    PAD/SET Patient? No      Pain Assessment   Currently in Pain? No/denies    Multiple Pain Sites No             Capillary Blood Glucose: No results found for this or any previous visit (from the past 24 hour(s)).    Social History   Tobacco Use  Smoking Status Every Day   Packs/day: 0.25   Years: 50.00   Total pack years: 12.50   Types: Cigarettes   Passive exposure: Current  Smokeless Tobacco Never  Tobacco Comments   1/4-1/2 ppd currently    Goals Met:  Proper associated with RPD/PD & O2 Sat Independence with exercise equipment Exercise tolerated well No report of concerns or symptoms today Strength training completed today  Goals Unmet:  Not Applicable  Comments: Service time is from 1010 to 1144.    Dr. Rodman Pickle is Medical Director for Pulmonary Rehab at [facility].

## 2022-07-07 ENCOUNTER — Encounter (HOSPITAL_COMMUNITY)
Admission: RE | Admit: 2022-07-07 | Discharge: 2022-07-07 | Disposition: A | Discharge status: Home / Self Care | Payer: Medicare HMO | Source: Ambulatory Visit | Attending: Student | Admitting: Student

## 2022-07-07 VITALS — Wt 165.8 lb

## 2022-07-07 DIAGNOSIS — J449 Chronic obstructive pulmonary disease, unspecified: Secondary | ICD-10-CM

## 2022-07-07 NOTE — Progress Notes (Signed)
Daily Session Note  Patient Details  Name: Mario Proctor MRN: [id number] Date of Birth: [birth year] Referring Provider:   April Manson Pulmonary Rehab Walk Test from 05/27/2022 in [facility] for [facility], Vascular, & [place]  Referring Provider [name]       Encounter Date: 07/07/2022  Check In:  Session Check In - 07/07/22 1122       Check-In   Supervising physician immediately available to respond to emergencies [facility] MD immediately available    Physician(s) Melina Copa, PA    [facility]    Staff Present Janine Ores, RN, BSN;Kamree Wiens Olen Cordial BS, ACSM-CEP, Exercise Physiologist;Samantha [place], RD, LDN;Other;Bailey Pearline Cables, MS, Exercise Physiologist    Virtual Visit No    Medication changes reported     No    Fall or balance concerns reported    No    Tobacco Cessation No Change    Warm-up and Cool-down Performed as group-led instruction    Resistance Training Performed Yes    VAD Patient? No    PAD/SET Patient? No      Pain Assessment   Currently in Pain? No/denies    Multiple Pain Sites No             Capillary Blood Glucose: No results found for this or any previous visit (from the past 24 hour(s)).   Exercise Prescription Changes - 07/07/22 1200       Response to Exercise   Blood Pressure (Admit) 128/70    Blood Pressure (Exercise) 142/80    Blood Pressure (Exit) 124/64    Heart Rate (Admit) 73 bpm    Heart Rate (Exercise) 85 bpm    Heart Rate (Exit) 75 bpm    Oxygen Saturation (Admit) 97 %    Oxygen Saturation (Exercise) 96 %    Oxygen Saturation (Exit) 98 %    Rating of Perceived Exertion (Exercise) 13    Perceived Dyspnea (Exercise) 2    Duration Continue with 30 min of aerobic exercise without signs/symptoms of physical distress.    Intensity THRR unchanged      Progression   Progression Continue to progress workloads to maintain intensity without signs/symptoms of physical distress.      Resistance  Training   Training Prescription Yes    Weight --   blue bands   Reps 10-15    Time 10 Minutes      Oxygen   Oxygen Continuous    Liters 2      Arm Ergometer   Level 3    RPM 43    Minutes 15      Track   Laps 9    Minutes 15    METs 2.38      Oxygen   Maintain Oxygen Saturation 88% or higher             Social History   Tobacco Use  Smoking Status Every Day   Packs/day: 0.25   Years: 50.00   Total pack years: 12.50   Types: Cigarettes   Passive exposure: Current  Smokeless Tobacco Never  Tobacco Comments   1/4-1/2 ppd currently    Goals Met:  Independence with exercise equipment Exercise tolerated well Queuing for purse lip breathing No report of concerns or symptoms today Strength training completed today  Goals Unmet:  Not Applicable  Comments: Service time is from 1012 to 1150.    Dr. Rodman Pickle is Medical Director for Pulmonary Rehab at [facility].

## 2022-07-08 DIAGNOSIS — R0602 Shortness of breath: Secondary | ICD-10-CM | POA: Diagnosis not present

## 2022-07-09 ENCOUNTER — Encounter (HOSPITAL_COMMUNITY)
Admission: RE | Admit: 2022-07-09 | Discharge: 2022-07-09 | Disposition: A | Discharge status: Home / Self Care | Payer: Medicare HMO | Source: Ambulatory Visit | Attending: Student | Admitting: Student

## 2022-07-09 ENCOUNTER — Telehealth: Payer: Self-pay

## 2022-07-09 DIAGNOSIS — J449 Chronic obstructive pulmonary disease, unspecified: Secondary | ICD-10-CM

## 2022-07-09 NOTE — Addendum Note (Signed)
Addended by: Gaye Alken on: 07/09/2022 03:26 PM   Modules accepted: Orders, Level of Service

## 2022-07-09 NOTE — Progress Notes (Signed)
  Pulmonary rehab clinic assessment note   Mario Proctor [id number] DOB: [birth year] DOA: 07/09/2022  PCP: Ronnell Freshwater, NP    HPI:    Patient with past medical history of nonischemic cardiomyopathy with documented ejection fraction of 30 to 35%, atrial fibrillation and COPD presenting to pulmonary rehab class today reporting some increase in shortness of breath compared to his baseline to nursing upon arrival.  Patient reports that for the past several days he has been experiencing some increasing shortness of breath.  Patient reports his symptoms are mild and is unable to discern whether these symptoms get worse with exertion.  Patient is also been complaining of several day history of increasing abdominal girth over the past several days.  Patient denies any pillow orthopnea, paroxysmal nocturnal dyspnea or significant pedal edema over the span of time.  Patient denies associated chest pain, cough, fever.  Upon ambulation in the pulmonary clinic patient notably desaturated on room air with ox saturations at 87%.  Staff then notified me to come evaluate the patient at the bedside.  Upon my discussion with the patient he admits to "always eating lots of salt."  He even admits to eating too ham biscuits yesterday morning.  Patient states that he is not currently take a scheduled diuretic as it has been discontinued in the past  Physical Exam:  BP: 156/88, HR 76, O2 92% on RA at rest, 98% on 2 L of oxygen via nasal cannula, temp 97.9 oral   Constitutional: Awake alert and oriented x3, patient mildly tachypneic Respiratory: Notable expiratory wheezing particularly in the bilateral lower fields with prolonged expiratory phase.  Some increased respiratory effort without any evidence of accessory muscle use.  No significant rales.   Cardiovascular: Regular rate and rhythm, no murmurs / rubs / gallops.  Trace distal bilateral lower extremity pitting edema.  2+ pedal pulses. No carotid bruits.      Assessment and Plan:  Shortness of breath/dyspnea on exertion -patient's complaints of increasing shortness of breath in the past several days are rather mild.  But accompanied by an increased oxygen requirement with exertion.  Per history, patient reports increasing abdominal girth over the past several days as well as increase salt intake.  Patient states that he has a as needed regimen of 20 mg tab once of Lasix at home.  I feel patient is likely experiencing multifactorial symptoms due to some degree of mild cardiogenic volume overload due to dietary noncompliance in the setting of advanced COPD.  Options were discussed with the patient including follow-up with his cardiologist versus going to an urgent care clinic or emergency department.  Patient opted to go home and therefore was advised to take 40 mg of oral Lasix upon arrival home this afternoon in addition to an additional 40 mg of Lasix tomorrow morning.  Patient is to follow-up with his cardiologist tomorrow for close outpatient follow-up.  Patient has been advised to report to the nearest emergency department if his symptoms worsen.  Patient has voiced his understanding and agreement with the plan.     Vernelle Emerald MD [facility] Pager [phone]  If 7PM-7AM, please contact night-coverage [email or web address] Use universal [facility] password for that web site. If you do not have the password, please call the hospital operator.  07/09/2022, 2:30 PM

## 2022-07-09 NOTE — Progress Notes (Addendum)
Pt came to Pulmonary Rehab Class today c/o increasing SOB on exertion and increased work of breathing while walking. VS taken, BP: 156/88, HR 76, O2 92% on RA, ambulating 87-88%, temp 97.9 oral. RN performed assessment, noted no lower extremity edema. Lung sounds, diminished throughout with expiratory wheezing noted in the bilateral lower lobes. Abdomin firm, abnormal for patient with positive bowels sounds in all 4 quads. Pt stated he had a normal BM this AM. Pt stated that he ate a country biscuit for breakfast the last 2 days and for dinner "wieners and kraut".   Asked Dr. Ronne Binning (covering provider for the day) to see the patient. Dr. Jeannine Kitten recommended pt take '40mg'$  of lasix today and tomorrow and to follow up with cardiology or to go to urgent care. Pt declined urgent care. Pt understands and agrees to plan. Pt has own pulse ox and will check his oxygen when he feels short of breath.   RN will call cards to see if they can get him an appointment.

## 2022-07-09 NOTE — Telephone Encounter (Addendum)
A user error has taken place: encounter opened in error, closed for administrative reasons.            This encounter was created in error - please disregard.

## 2022-07-09 NOTE — Telephone Encounter (Addendum)
Stanton Kidney RN with Pulmonary rehab:  Patient is having more DOE, weight gain of 3lb since Tuesday. No diuretics in his chart, but patient reported Lasix '20mg'$  at home, but Dr. Inda Merlin, MD advised patient to double up today and tomorrow. Note will be faxed over.    Stanton Kidney, [place]

## 2022-07-13 ENCOUNTER — Ambulatory Visit: Payer: Medicare HMO | Admitting: Internal Medicine

## 2022-07-13 ENCOUNTER — Telehealth: Payer: Self-pay

## 2022-07-13 ENCOUNTER — Encounter: Payer: Self-pay | Admitting: Internal Medicine

## 2022-07-13 VITALS — BP 131/81 | HR 78 | Ht 71.0 in | Wt 173.8 lb

## 2022-07-13 DIAGNOSIS — I5042 Chronic combined systolic (congestive) and diastolic (congestive) heart failure: Secondary | ICD-10-CM

## 2022-07-13 DIAGNOSIS — I480 Paroxysmal atrial fibrillation: Secondary | ICD-10-CM | POA: Diagnosis not present

## 2022-07-13 DIAGNOSIS — I10 Essential (primary) hypertension: Secondary | ICD-10-CM | POA: Diagnosis not present

## 2022-07-13 NOTE — Progress Notes (Signed)
Primary Physician/Referring:  Ronnell Freshwater, NP  Patient ID: Mario Proctor, male    DOB: October 21, 1940, 82 y.o.   MRN: [id number]  Chief Complaint  Patient presents with   Atrial Fibrillation   Follow-up    HPI:    Mario Proctor  is a 82 y.o. Caucasian male patient  with nonischemic cardiomyopathy by coronary angiogram on 11/02/2016 revealing ejection fraction of 30-35%, ongoning tobacco use disorder, and A fib. He is on anticoagulation, Amiodarone, and Metoprolol.    Patient was admitted 05/03/2021 - 05/06/2021 with COVID-19 pneumonia and acute on chronic systolic heart failure.  Patient was treated with steroids and remdesivir and diuresed with IV Lasix, dyspnea significantly improved and patient was sent home with supplemental oxygen.  Notably echocardiogram during hospitalization showed mild improvement of LVEF from 35-40% to 40 5-50%.   Patient now presents for 67-monthfollow-up. He has been doing well from a cardiac standpoint since the last time he was here. He is tolerating his medications without issues. RPM data shows pretty well controlled blood pressures at home. No bleeding issues on Xarelto. Patient denies chest pain, palpitations, diaphoresis, syncope, edema, PND, orthopnea. He has chronic shortness of breath but he is working with pulmonary rehab and feeling a little better.   Past Medical History:  Diagnosis Date   A-fib ([facility]    Allergy    CHF (congestive heart failure) (HCC)    Dupuytren contracture    right sm finger   Myocardial infarct ([facility] 2019   Myocardial infarction ([facility]    Small bowel obstruction ([facility]    Past Surgical History:  Procedure Laterality Date   CARDIOVERSION N/A 07/30/2020   Procedure: CARDIOVERSION;  Surgeon: GAdrian Prows MD;  Location: [facility]  Service: Cardiovascular;  Laterality: N/A;   COLON SURGERY     bowel blockage   DUPUYTREN CONTRACTURE RELEASE Left 2011   FASCIECTOMY Right 05/02/2015   Procedure: FASCIECTOMY RIGHT  SMALL FINGER;  Surgeon: GDaryll Brod MD;  Location[place] [facility]  Service: Orthopedics;  Laterality: Right;  axillary block in preop   LEFT HEART CATH AND CORONARY ANGIOGRAPHY N/A 11/03/2016   Procedure: Left Heart Cath and Coronary Angiography;  Surgeon: GAdrian Prows MD;  [facility];  Service: Cardiovascular;  Laterality: N/A;   TONSILLECTOMY     TYMPANOPLASTY Right    Family History  Problem Relation Age of Onset   Cancer Brother        "in heart and lungs"     Social History   Tobacco Use   Smoking status: Every Day    Packs/day: 0.25    Years: 50.00    Total pack years: 12.50    Types: Cigarettes    Passive exposure: Current   Smokeless tobacco: Never   Tobacco comments:    1/4-1/2 ppd currently  Substance Use Topics   Alcohol use: Yes    Alcohol/week: 6.0 standard drinks of alcohol    Types: 6 Cans of beer per week    Comment: social   Marital Status: Married  ROS  Review of Systems  Cardiovascular:  Positive for dyspnea on exertion. Negative for chest pain, claudication, leg swelling, near-syncope, orthopnea, palpitations, paroxysmal nocturnal dyspnea and syncope.  Respiratory:  Positive for cough (chronic, stable) and shortness of breath.   Neurological:  Negative for dizziness.   Objective  Blood pressure 131/81, pulse 78, height '5\' 11"'$  (1.803 m), weight 173 lb 12.8 oz (78.8 kg), SpO2 95 %.  07/13/2022   11:46 AM 07/07/2022   12:31 PM 06/24/2022    8:16 AM  Vitals with BMI  Height '5\' 11"'$   '5\' 11"'$   Weight 173 lbs 13 oz 165 lbs 13 oz 171 lbs 6 oz  BMI 24.25 [id number]  Systolic [id number]  [id number]  Diastolic 81  83  Pulse 78  81     Physical Exam Vitals reviewed.  Constitutional:      Appearance: He is well-developed.  Neck:     Thyroid: No thyromegaly.     Vascular: No JVD.  Cardiovascular:     Rate and Rhythm: Normal rate and regular rhythm.     Pulses: Intact distal pulses.          Carotid pulses are 2+ on the right side and 2+  on the left side.      Radial pulses are 2+ on the right side and 2+ on the left side.       Femoral pulses are 2+ on the right side and 2+ on the left side.      Popliteal pulses are 2+ on the right side and 2+ on the left side.       Dorsalis pedis pulses are 1+ on the right side and 1+ on the left side.       Posterior tibial pulses are 1+ on the right side and 1+ on the left side.     Heart sounds: Heart sounds are distant. No murmur heard.    No gallop.     Comments: No JVD.  Pulmonary:     Effort: Pulmonary effort is normal. No accessory muscle usage or respiratory distress.     Breath sounds: No wheezing or rhonchi.  Musculoskeletal:     Right lower leg: No edema.     Left lower leg: No edema.   Physical exam unchanged compared to previous office visit.  Laboratory examination:   Recent Labs    08/29/21 1042 09/17/21 1110 01/23/22 1039  NA 138 136 136  K 5.4* 6.0* 4.7  CL 104 100 99  CO2 21 20 32  GLUCOSE 73 100* 95  BUN 29* 29* 19  CREATININE 2.03* 2.43* 1.43  CALCIUM 8.7 8.9 9.0   CrCl cannot be calculated (Patient's most recent lab result is older than the maximum 21 days allowed.).     Latest Ref Rng & Units 01/23/2022   10:39 AM 09/17/2021   11:10 AM 08/29/2021   10:42 AM  CMP  Glucose 70 - 99 mg/dL 95  100  73   BUN 6 - 23 mg/dL '19  29  29   '$ Creatinine 0.40 - 1.50 mg/dL 1.43  2.43  2.03   Sodium 135 - 145 mEq/L 136  136  138   Potassium 3.5 - 5.1 mEq/L 4.7  6.0  5.4   Chloride 96 - 112 mEq/L 99  100  104   CO2 19 - 32 mEq/L 32  20  21   Calcium 8.4 - 10.5 mg/dL 9.0  8.9  8.7       Latest Ref Rng & Units 04/24/2022   10:02 AM 05/26/2021   10:36 AM 05/14/2021   10:19 AM  CBC  WBC 4.0 - 10.5 K/uL 7.6  5.0  8.7   Hemoglobin 13.0 - 17.0 g/dL 14.4  13.3  12.7   Hematocrit 39.0 - 52.0 % 43.8  40.4  38.3   Platelets 150.0 - 400.0 K/uL 303.0  416.0  223    Lipid  Panel     Component Value Date/Time   CHOL 139 06/12/2020 0947   TRIG 61 06/12/2020 0947    HDL 66 06/12/2020 0947   LDLCALC 60 06/12/2020 0947   External labs: None  Allergies  No Known Allergies   Medications Prior to Visit:   Outpatient Medications Prior to Visit  Medication Sig Dispense Refill   albuterol (ACCUNEB) 1.25 MG/3ML nebulizer solution Take 3 mLs (1.25 mg total) by nebulization every 4 (four) hours as needed for wheezing or shortness of breath. 75 mL 3   albuterol (VENTOLIN HFA) 108 (90 Base) MCG/ACT inhaler TAKE 2 PUFFS BY MOUTH EVERY 6 HOURS AS NEEDED FOR WHEEZE OR SHORTNESS OF BREATH 18 each 5   clobetasol cream (TEMOVATE) [id number] % Apply 1 Application topically 2 (two) times daily as needed. 80 g 3   doxycycline (VIBRA-TABS) 100 MG tablet Take 1 tablet (100 mg total) by mouth 2 (two) times daily. 14 tablet 0   Fluticasone-Umeclidin-Vilant (TRELEGY ELLIPTA) 100-62.5-25 MCG/ACT AEPB Inhale 1 puff into the lungs daily. 2 each 0   hydrOXYzine (VISTARIL) 25 MG capsule Take 1 capsule po QHS prn itching 30 capsule 3   lisinopril (ZESTRIL) 10 MG tablet TAKE 1 TABLET BY MOUTH EVERYDAY AT BEDTIME 90 tablet 1   metoprolol succinate (TOPROL-XL) 25 MG 24 hr tablet TAKE 1 TABLET (25 MG TOTAL) BY MOUTH DAILY. 90 tablet 3   rosuvastatin (CRESTOR) 5 MG tablet TAKE 1 TABLET BY MOUTH EVERY DAY 90 tablet 3   Spacer/Aero-Holding Chambers DEVI Use with inhaler 1 each 2   TRELEGY ELLIPTA 100-62.5-25 MCG/ACT AEPB INHALE 1 PUFF BY MOUTH EVERY DAY 60 each 5   triamcinolone cream (KENALOG) 0.1 % Apply 1 application topically 2 (two) times daily.     XARELTO 20 MG TABS tablet TAKE 1 TABLET BY MOUTH EVERY DAY WITH SUPPER 90 tablet 3   amiodarone (PACERONE) 100 MG tablet Take 100 mg by mouth daily. (Patient not taking: Reported on 07/13/2022)     Facility-Administered Medications Prior to Visit  Medication Dose Route Frequency Provider Last Rate Last Admin   ipratropium-albuterol (DUONEB) 0.5-2.5 (3) MG/3ML nebulizer solution 3 mL  3 mL Nebulization Once Martyn Ehrich, NP       Final  Medications at End of Visit    Current Meds  Medication Sig   albuterol (ACCUNEB) 1.25 MG/3ML nebulizer solution Take 3 mLs (1.25 mg total) by nebulization every 4 (four) hours as needed for wheezing or shortness of breath.   albuterol (VENTOLIN HFA) 108 (90 Base) MCG/ACT inhaler TAKE 2 PUFFS BY MOUTH EVERY 6 HOURS AS NEEDED FOR WHEEZE OR SHORTNESS OF BREATH   clobetasol cream (TEMOVATE) [id number] % Apply 1 Application topically 2 (two) times daily as needed.   doxycycline (VIBRA-TABS) 100 MG tablet Take 1 tablet (100 mg total) by mouth 2 (two) times daily.   Fluticasone-Umeclidin-Vilant (TRELEGY ELLIPTA) 100-62.5-25 MCG/ACT AEPB Inhale 1 puff into the lungs daily.   hydrOXYzine (VISTARIL) 25 MG capsule Take 1 capsule po QHS prn itching   lisinopril (ZESTRIL) 10 MG tablet TAKE 1 TABLET BY MOUTH EVERYDAY AT BEDTIME   metoprolol succinate (TOPROL-XL) 25 MG 24 hr tablet TAKE 1 TABLET (25 MG TOTAL) BY MOUTH DAILY.   rosuvastatin (CRESTOR) 5 MG tablet TAKE 1 TABLET BY MOUTH EVERY DAY   Spacer/Aero-Holding Chambers DEVI Use with inhaler   TRELEGY ELLIPTA 100-62.5-25 MCG/ACT AEPB INHALE 1 PUFF BY MOUTH EVERY DAY   triamcinolone cream (KENALOG) 0.1 % Apply 1 application topically 2 (  two) times daily.   XARELTO 20 MG TABS tablet TAKE 1 TABLET BY MOUTH EVERY DAY WITH SUPPER   Current Facility-Administered Medications for the 07/13/22 encounter (Office Visit) with Floydene Flock, DO  Medication   ipratropium-albuterol (DUONEB) 0.5-2.5 (3) MG/3ML nebulizer solution 3 mL   Radiology:   Low-dose CT chest 08/16/2019: Coronary and aortic atherosclerosis and calcification.  Severe centrilobular emphysema.  2 benign looking nodules.  Benign right upper kidney mass.  Chest x-ray 08/29/2020: Cardiac shadow is within normal limits. Aortic calcifications are seen. The lungs are well aerated bilaterally. No bony abnormality is seen. IMPRESSION: No active disease.   CT head without contrast 08/29/2020: 1. Advanced  chronic ischemic microangiopathy without acute intracranial abnormality. 2. No acute fracture or static subluxation of the cervical spine. 3. Small right frontal scalp hematoma.  Cardiac Studies:   Lexiscan myoview stress test 09/18/2016: 1. The resting electrocardiogram demonstrated normal sinus rhythm, normal resting conduction, no resting arrhythmias and normal rest repolarization.  Stress EKG is non-diagnostic for ischemia as it a pharmacologic stress using Lexiscan. Stress symptoms included dyspnea. Occasional PVC noted. 2. The LV is dilated both at rest and stress images. The LV end diastolic volume was 0000000. SPECT images demonstrate Medium perfusion abnormality of moderate intensity in the basal inferior, mid inferior and apical inferior myocardial wall(s) on the stress images.  The defect remains relatively unchanged between rest and stress images and is a soft tissue attenuation artifact, however scar in this region without ischemia cannot be completely excluded. The left ventricular ejection fraction was calculated or visually estimated to be 25% with global hypokinesis. High risk study.  Abdominal aortic duplex 10/06/2016: Diffuse plaque noted in the proximal, mid and distal aorta.  No AAA observed.  Coronary angiogram 11/03/2016: Severe left ventricular systolic dysfunction. The left ventricular ejection fraction is 30-35% by visual estimate with inferior wall akinesis.There is no mitral valve regurgitation. Normal LVEDP. Normal coronary arteries.  Holter Monitor 48 hours 09/07/2017: Minimum heart rate 38 bpm at 12:13 AM maximum heart rate 154 bpm at 12:59 PM. PVCs consisted of 3600 beats, 1.5% burden. Occasional ventricular and triplets, 194 couplets.. Bigeminy and trigeminy. 2 episodes of 4 beat 3 beat NSVT. Predominant rhythm was atrial fibrillation.   Direct current cardioversion 07/30/20 : Indication symptomatic A. Fibrillation. Procedure: Using 80 mg of IV Propofol and 60 IV  Lidocaine (for reducing venous pain) for achieving deep sedation, synchronized direct current cardioversion performed. Patient was delivered with 150 Joules of electricity X 1 with success to NSR. Patient tolerated the procedure well. No immediate complication noted.   Echocardiogram 05/04/2021:  1. Left ventricular ejection fraction, by estimation, is 45 to 50%. The left ventricle has mildly decreased function. The left ventricle demonstrates global hypokinesis. There is mild left ventricular  hypertrophy. Left ventricular diastolic parameters were normal.   2. Right ventricular systolic function is normal. The right ventricular size is normal.   3. The mitral valve is normal in structure. Trivial mitral valve regurgitation. No evidence of mitral stenosis.   4. The aortic valve is normal in structure. Aortic valve regurgitation is not visualized. No aortic stenosis is present.   5. Compared to previous outpatient study in 10/2020, LVEF is marginally improved from 35-40%.  Echocardiogram 02/26/2022:  Left ventricle cavity is normal in size. Mild concentric hypertrophy of  the left ventricle. Severe global hypokinesis. LVEF 30-35%. Doppler  evidence of grade I (impaired) diastolic dysfunction, normal LAP.  Calculated EF 37%.  Structurally normal mitral valve.  Mild  to moderate mitral regurgitation.  No significant change compared to 10/2020.   EKG:  EKG 01/28/22: Sinus rhythm with first-degree AV block at a rate of 58 bpm.  Left axis, left complete bundle branch block.  Poor R wave progression, cannot exclude anteroseptal infarct old.  LVH. Compared to previous EKG, no significant change  07/13/2022: Atrial fibrillation with CVR, rate 71 bpm. Left bundle branch block.  Assessment     ICD-10-CM   1. Paroxysmal atrial fibrillation (HCC)  I48.0 EKG 12-Lead    2. Essential hypertension  I10     3. Chronic combined systolic and diastolic CHF (congestive heart failure) (HCC)  I50.42       No  orders of the defined types were placed in this encounter.   There are no discontinued medications. This patients CHA2DS2-VASc Score 3 (CHF, A) and yearly risk of stroke 3.2%.    Recommendations:   Mario Proctor  is a 82 y.o.  Caucasian male patient  with nonischemic cardiomyopathy by coronary angiogram on 11/02/2016 revealing ejection fraction of 30-35%, ongoning tobacco use disorder, and A fib. He is on anticoagulation, Amiodarone, and Metoprolol.     Paroxysmal atrial fibrillation (HCC) EKG shows afib with controlled ventricular response today Continue Xarelto as ordered.  Chronic combined systolic and diastolic CHF (congestive heart failure) (HCC) No evidence of acute heart failure exacerbation, continue current medications. Echo unchanged compared to prior He is working with pulmonary rehab and is feeling better  Primary hypertension Continue Losartan '25mg'$  daily. Continue in RPM program Recommend continued daily weights and low-sodium diet.  Follow-up in 6 months, sooner if needed.    Floydene Flock, DO, [facility] 07/13/2022, 12:18 PM Office: [phone]

## 2022-07-13 NOTE — Telephone Encounter (Signed)
Patient home blood pressure is fairly controlled on current antihypertensive regimen.   Systolic Blood Pressure mmHg -- 138.3 (123456 - [id number]) Diastolic Blood Pressure mmHg -- 86.2 (58.0 - 108.0) Heart Rate bpm -- 75.9 (62.0 - 84.0)  07/13/22 8:49 AM  121 / 75 mmHg 70 bpm  07/12/22 12:18 PM  120 / 78 mmHg 69 bpm  07/11/22 10:32 AM  100 / 58 mmHg 66 bpm  07/10/22 4:34 PM  109 / 67 mmHg 62 bpm  07/09/22 4:02 PM  165 / 108 mmHg 76 bpm  07/09/22 4:01 PM  165 / 108 mmHg 81 bpm  07/08/22 10:06 AM  145 / 85 mmHg 77 bpm  07/06/22 8:30 AM  150 / 95 mmHg 71 bpm  07/05/22 5:58 PM  152 / 98 mmHg 79 bpm

## 2022-07-14 ENCOUNTER — Encounter (HOSPITAL_COMMUNITY): Payer: Medicare HMO

## 2022-07-16 ENCOUNTER — Other Ambulatory Visit: Payer: Self-pay | Admitting: Nurse Practitioner

## 2022-07-16 ENCOUNTER — Encounter (HOSPITAL_COMMUNITY)
Admission: RE | Admit: 2022-07-16 | Discharge: 2022-07-16 | Disposition: A | Discharge status: Home / Self Care | Payer: Medicare HMO | Source: Ambulatory Visit | Attending: Student | Admitting: Student

## 2022-07-16 VITALS — Wt 172.4 lb

## 2022-07-16 DIAGNOSIS — J449 Chronic obstructive pulmonary disease, unspecified: Secondary | ICD-10-CM

## 2022-07-16 DIAGNOSIS — J441 Chronic obstructive pulmonary disease with (acute) exacerbation: Secondary | ICD-10-CM | POA: Diagnosis not present

## 2022-07-16 DIAGNOSIS — Z5189 Encounter for other specified aftercare: Secondary | ICD-10-CM | POA: Diagnosis not present

## 2022-07-16 DIAGNOSIS — I10 Essential (primary) hypertension: Secondary | ICD-10-CM | POA: Diagnosis not present

## 2022-07-16 DIAGNOSIS — L308 Other specified dermatitis: Secondary | ICD-10-CM

## 2022-07-16 NOTE — Progress Notes (Signed)
Daily Session Note  Patient Details  Name: Mario Proctor MRN: [id number] Date of Birth: 1940-09-30 Referring Provider:   April Manson Pulmonary Rehab Walk Test from 05/27/2022 in [facility]  Referring Provider [name]       Encounter Date: 07/16/2022  Check In:  Session Check In - 07/16/22 1159       Check-In   Supervising physician immediately available to respond to emergencies [facility] MD immediately available    Physician(s) Dr Vaughan Browner    Location [facility]    Staff Present Janine Ores, RN, Quentin Ore, MS, ACSM-CEP, Exercise Physiologist;Randi Yevonne Pax, ACSM-CEP, Exercise Physiologist;Samantha [place], RD, LDN;Other    Virtual Visit No    Medication changes reported     No    Fall or balance concerns reported    No    Tobacco Cessation No Change    Warm-up and Cool-down Performed as group-led instruction    Resistance Training Performed Yes    VAD Patient? No    PAD/SET Patient? No      Pain Assessment   Currently in Pain? No/denies    Multiple Pain Sites No             Capillary Blood Glucose: No results found for this or any previous visit (from the past 24 hour(s)).   Exercise Prescription Changes - 07/16/22 1200       Response to Exercise   Blood Pressure (Admit) 138/80    Blood Pressure (Exercise) 142/70    Blood Pressure (Exit) 130/70    Heart Rate (Admit) 69 bpm    Heart Rate (Exercise) 105 bpm    Heart Rate (Exit) 58 bpm    Oxygen Saturation (Admit) 99 %    Oxygen Saturation (Exercise) 94 %    Oxygen Saturation (Exit) 98 %    Rating of Perceived Exertion (Exercise) 11    Perceived Dyspnea (Exercise) 2    Duration Continue with 30 min of aerobic exercise without signs/symptoms of physical distress.    Intensity THRR unchanged      Progression   Progression Continue to progress workloads to maintain intensity without signs/symptoms of physical distress.       Resistance Training   Training Prescription Yes    Weight blue bands    Reps 10-15    Time 10 Minutes      Oxygen   Oxygen Continuous    Liters 2      Arm Ergometer   Level 3    Watts 8    Minutes 15      Track   Laps 7    Minutes 15    METs 2.08      Oxygen   Maintain Oxygen Saturation 88% or higher             Social History   Tobacco Use  Smoking Status Every Day   Packs/day: 0.25   Years: 50.00   Total pack years: 12.50   Types: Cigarettes   Passive exposure: Current  Smokeless Tobacco Never  Tobacco Comments   1/4-1/2 ppd currently    Goals Met:  Proper associated with RPD/PD & O2 Sat Independence with exercise equipment Exercise tolerated well No report of concerns or symptoms today Strength training completed today  Goals Unmet:  Not Applicable  Comments: Service time is from 1012 to 1143.    Dr. Rodman Pickle is Medical Director for Pulmonary Rehab at [facility].

## 2022-07-21 ENCOUNTER — Encounter (HOSPITAL_COMMUNITY)
Admission: RE | Admit: 2022-07-21 | Discharge: 2022-07-21 | Disposition: A | Discharge status: Home / Self Care | Payer: Medicare HMO | Source: Ambulatory Visit | Attending: Student | Admitting: Student

## 2022-07-21 DIAGNOSIS — Z5189 Encounter for other specified aftercare: Secondary | ICD-10-CM | POA: Diagnosis not present

## 2022-07-21 DIAGNOSIS — J449 Chronic obstructive pulmonary disease, unspecified: Secondary | ICD-10-CM

## 2022-07-21 DIAGNOSIS — J441 Chronic obstructive pulmonary disease with (acute) exacerbation: Secondary | ICD-10-CM | POA: Diagnosis not present

## 2022-07-22 NOTE — Progress Notes (Signed)
Pulmonary Individual Treatment Plan  Patient Details  Name: Mario Proctor MRN: [id number] Date of Birth: Dec 30, 1940 Referring Provider:   April Manson Pulmonary Rehab Walk Test from 05/27/2022 in [facility] [place] [facility]  Referring Provider [name]       Initial Encounter Date:  Flowsheet Row Pulmonary Rehab Walk Test from 05/27/2022 in [facility]  Date 05/27/22       Visit Diagnosis: Stage 2 moderate COPD by GOLD classification ([place])  Patient's Home Medications on Admission:   Current Outpatient Medications:    albuterol (ACCUNEB) 1.25 MG/3ML nebulizer solution, Take 3 mLs (1.25 mg total) by nebulization every 4 (four) hours as needed for wheezing or shortness of breath., Disp: 75 mL, Rfl: 3   albuterol (VENTOLIN HFA) 108 (90 Base) MCG/ACT inhaler, TAKE 2 PUFFS BY MOUTH EVERY 6 HOURS AS NEEDED FOR WHEEZE OR SHORTNESS OF BREATH, Disp: 18 each, Rfl: 5   amiodarone (PACERONE) 100 MG tablet, Take 100 mg by mouth daily. (Patient not taking: Reported on 07/13/2022), Disp: , Rfl:    clobetasol cream (TEMOVATE) [id number] %, Apply 1 Application topically 2 (two) times daily as needed., Disp: 80 g, Rfl: 3   doxycycline (VIBRA-TABS) 100 MG tablet, Take 1 tablet (100 mg total) by mouth 2 (two) times daily., Disp: 14 tablet, Rfl: 0   Fluticasone-Umeclidin-Vilant (TRELEGY ELLIPTA) 100-62.5-25 MCG/ACT AEPB, Inhale 1 puff into the lungs daily., Disp: 2 each, Rfl: 0   hydrOXYzine (VISTARIL) 25 MG capsule, TAKE 1 CAPSULE BY MOUTH AT BEDTIME AS NEEDED FOR ITCHING, Disp: 90 capsule, Rfl: 2   lisinopril (ZESTRIL) 10 MG tablet, TAKE 1 TABLET BY MOUTH EVERYDAY AT BEDTIME, Disp: 90 tablet, Rfl: 1   metoprolol succinate (TOPROL-XL) 25 MG 24 hr tablet, TAKE 1 TABLET (25 MG TOTAL) BY MOUTH DAILY., Disp: 90 tablet, Rfl: 3   rosuvastatin (CRESTOR) 5 MG tablet, TAKE 1 TABLET BY MOUTH EVERY DAY, Disp: 90 tablet, Rfl:  3   Spacer/Aero-Holding Chambers DEVI, Use with inhaler, Disp: 1 each, Rfl: 2   TRELEGY ELLIPTA 100-62.5-25 MCG/ACT AEPB, INHALE 1 PUFF BY MOUTH EVERY DAY, Disp: 60 each, Rfl: 5   triamcinolone cream (KENALOG) 0.1 %, Apply 1 application topically 2 (two) times daily., Disp: , Rfl:    XARELTO 20 MG TABS tablet, TAKE 1 TABLET BY MOUTH EVERY DAY WITH SUPPER, Disp: 90 tablet, Rfl: 3  Current Facility-Administered Medications:    ipratropium-albuterol (DUONEB) 0.5-2.5 (3) MG/3ML nebulizer solution 3 mL, 3 mL, Nebulization, Once, Martyn Ehrich, NP  Past Medical History: Past Medical History:  Diagnosis Date   A-fib [facility])    Allergy    CHF (congestive heart failure) ([place])    Dupuytren contracture    right sm finger   Myocardial infarct ([place]) 2019   Myocardial infarction ([place])    Small bowel obstruction ([facility])     Tobacco Use: Social History   Tobacco Use  Smoking Status Every Day   Packs/day: 0.25   Years: 50.00   Total pack years: 12.50   Types: Cigarettes   Passive exposure: Current  Smokeless Tobacco Never  Tobacco Comments   1/4-1/2 ppd currently    Labs: Review Flowsheet       Latest Ref Rng & Units 09/01/2018 06/12/2020  Labs for ITP Cardiac and Pulmonary Rehab  Cholestrol 100 - 199 mg/dL 140  139   LDL (calc) 0 - 99 mg/dL 67  60   HDL-C >  39 mg/dL 62  66   Trlycerides 0 - 149 mg/dL 54  61     Capillary Blood Glucose: No results found for: "GLUCAP"   Pulmonary Assessment Scores:  Pulmonary Assessment Scores     Row Name 05/27/22 1050         ADL UCSD   ADL Phase Entry     SOB Score total 49       CAT Score   CAT Score 15       mMRC Score   mMRC Score 4             UCSD: Self-administered rating of dyspnea associated with activities of daily living (ADLs) 6-point scale (0 = "not at all" to 5 = "maximal or unable to do because of breathlessness")  Scoring Scores range from 0 to 120.  Minimally important difference is 5 units  CAT: CAT can  identify the health impairment of COPD patients and is better correlated with disease progression.  CAT has a scoring range of zero to 40. The CAT score is classified into four groups of low (less than 10), medium (10 - 20), high (21-30) and very high (31-40) based on the impact level of disease on health status. A CAT score over 10 suggests significant symptoms.  A worsening CAT score could be explained by an exacerbation, poor medication adherence, poor inhaler technique, or progression of COPD or comorbid conditions.  CAT MCID is 2 points  mMRC: mMRC (Modified Medical Research Council) Dyspnea Scale is used to assess the degree of baseline functional disability in patients of respiratory disease due to dyspnea. No minimal important difference is established. A decrease in score of 1 point or greater is considered a positive change.   Pulmonary Function Assessment:  Pulmonary Function Assessment - 05/27/22 1015       Breath   Bilateral Breath Sounds Wheezes;Expiratory    Shortness of Breath Yes;Limiting activity             Exercise Target Goals: Exercise Program Goal: Individual exercise prescription set using results from initial 6 min walk test and THRR while considering  patient's activity barriers and safety.   Exercise Prescription Goal: Initial exercise prescription builds to 30-45 minutes a day of aerobic activity, 2-3 days per week.  Home exercise guidelines will be given to patient during program as part of exercise prescription that the participant will acknowledge.  Activity Barriers & Risk Stratification:  Activity Barriers & Cardiac Risk Stratification - 05/27/22 1046       Activity Barriers & Cardiac Risk Stratification   Activity Barriers Arthritis;Deconditioning;Muscular Weakness;Shortness of Breath;History of Falls             6 Minute Walk:  6 Minute Walk     Row Name 05/27/22 1148         6 Minute Walk   Phase Initial     Distance 860 feet      Walk Time 6 minutes     # of Rest Breaks 2  1:45-2:38, 3:24-4:45     MPH 1.63     METS 1.95     RPE 13     Perceived Dyspnea  1     VO2 Peak 6.83     Symptoms No     Resting HR 79 bpm     Resting BP 158/90     Resting Oxygen Saturation  96 %     Exercise Oxygen Saturation  during 6 min walk 85 %  Max Ex. HR 87 bpm     Max Ex. BP 164/84     2 Minute Post BP 140/80       Interval HR   1 Minute HR 84     2 Minute HR 84     3 Minute HR 86     4 Minute HR 8     5 Minute HR 85     6 Minute HR 80     2 Minute Post HR 78     Interval Heart Rate? Yes       Interval Oxygen   Interval Oxygen? Yes     Baseline Oxygen Saturation % 96 %     1 Minute Oxygen Saturation % 95 %     1 Minute Liters of Oxygen 0 L     2 Minute Oxygen Saturation % 88 %     2 Minute Liters of Oxygen 0 L     3 Minute Oxygen Saturation % 89 %     3 Minute Liters of Oxygen 0 L     4 Minute Oxygen Saturation % 88 %     4 Minute Liters of Oxygen 0 L     5 Minute Oxygen Saturation % 89 %  85 @ 5:47     5 Minute Liters of Oxygen 0 L  Increased to 1L     6 Minute Oxygen Saturation % 95 %     6 Minute Liters of Oxygen 0 L     2 Minute Post Oxygen Saturation % 96 %     2 Minute Post Liters of Oxygen 0 L              Oxygen Initial Assessment:  Oxygen Initial Assessment - 05/27/22 1047       Home Oxygen   Home Oxygen Device Home Concentrator;E-Tanks    Sleep Oxygen Prescription Continuous    Liters per minute 2    Home Exercise Oxygen Prescription None    Home Resting Oxygen Prescription None    Compliance with Home Oxygen Use No      Initial 6 min Walk   Oxygen Used None      Program Oxygen Prescription   Program Oxygen Prescription None      Intervention   Short Term Goals To learn and understand importance of maintaining oxygen saturations>88%;To learn and demonstrate proper use of respiratory medications;To learn and understand importance of monitoring SPO2 with pulse oximeter and  demonstrate accurate use of the pulse oximeter.;To learn and demonstrate proper pursed lip breathing techniques or other breathing techniques. ;To learn and exhibit compliance with exercise, home and travel O2 prescription    Long  Term Goals Exhibits compliance with exercise, home  and travel O2 prescription;Verbalizes importance of monitoring SPO2 with pulse oximeter and return demonstration;Maintenance of O2 saturations>88%;Exhibits proper breathing techniques, such as pursed lip breathing or other method taught during program session;Compliance with respiratory medication;Demonstrates proper use of MDI's             Oxygen Re-Evaluation:  Oxygen Re-Evaluation     Row Name 06/17/22 0905 07/17/22 1228           Program Oxygen Prescription   Program Oxygen Prescription Continuous Continuous      Liters per minute 2 2      Comments O2 sat 90-96% on 2L --        Home Oxygen   Home Oxygen Device Home Concentrator;E-Tanks Home Concentrator;E-Tanks  Sleep Oxygen Prescription Continuous Continuous      Liters per minute 2 2      Home Exercise Oxygen Prescription None None      Home Resting Oxygen Prescription None None      Compliance with Home Oxygen Use No No        Goals/Expected Outcomes   Short Term Goals To learn and understand importance of maintaining oxygen saturations>88%;To learn and demonstrate proper use of respiratory medications;To learn and understand importance of monitoring SPO2 with pulse oximeter and demonstrate accurate use of the pulse oximeter.;To learn and demonstrate proper pursed lip breathing techniques or other breathing techniques. ;To learn and exhibit compliance with exercise, home and travel O2 prescription To learn and understand importance of maintaining oxygen saturations>88%;To learn and demonstrate proper use of respiratory medications;To learn and understand importance of monitoring SPO2 with pulse oximeter and demonstrate accurate use of the pulse  oximeter.;To learn and demonstrate proper pursed lip breathing techniques or other breathing techniques. ;To learn and exhibit compliance with exercise, home and travel O2 prescription      Long  Term Goals Exhibits compliance with exercise, home  and travel O2 prescription;Verbalizes importance of monitoring SPO2 with pulse oximeter and return demonstration;Maintenance of O2 saturations>88%;Exhibits proper breathing techniques, such as pursed lip breathing or other method taught during program session;Compliance with respiratory medication;Demonstrates proper use of MDI's Exhibits compliance with exercise, home  and travel O2 prescription;Verbalizes importance of monitoring SPO2 with pulse oximeter and return demonstration;Maintenance of O2 saturations>88%;Exhibits proper breathing techniques, such as pursed lip breathing or other method taught during program session;Compliance with respiratory medication;Demonstrates proper use of MDI's      Goals/Expected Outcomes Compliance and understanding of oxygen saturation monitoring and breathing techniques to decrease shortness of breath. Compliance and understanding of oxygen saturation monitoring and breathing techniques to decrease shortness of breath.               Oxygen Discharge (Final Oxygen Re-Evaluation):  Oxygen Re-Evaluation - 07/17/22 1228       Program Oxygen Prescription   Program Oxygen Prescription Continuous    Liters per minute 2      Home Oxygen   Home Oxygen Device Home Concentrator;E-Tanks    Sleep Oxygen Prescription Continuous    Liters per minute 2    Home Exercise Oxygen Prescription None    Home Resting Oxygen Prescription None    Compliance with Home Oxygen Use No      Goals/Expected Outcomes   Short Term Goals To learn and understand importance of maintaining oxygen saturations>88%;To learn and demonstrate proper use of respiratory medications;To learn and understand importance of monitoring SPO2 with pulse oximeter  and demonstrate accurate use of the pulse oximeter.;To learn and demonstrate proper pursed lip breathing techniques or other breathing techniques. ;To learn and exhibit compliance with exercise, home and travel O2 prescription    Long  Term Goals Exhibits compliance with exercise, home  and travel O2 prescription;Verbalizes importance of monitoring SPO2 with pulse oximeter and return demonstration;Maintenance of O2 saturations>88%;Exhibits proper breathing techniques, such as pursed lip breathing or other method taught during program session;Compliance with respiratory medication;Demonstrates proper use of MDI's    Goals/Expected Outcomes Compliance and understanding of oxygen saturation monitoring and breathing techniques to decrease shortness of breath.             Initial Exercise Prescription:  Initial Exercise Prescription - 05/27/22 1100       Date of Initial Exercise RX and Referring Provider  Date 05/27/22    Referring Provider [name]    Expected Discharge Date 07/30/22      Oxygen   Oxygen Continuous    Liters 1    Maintain Oxygen Saturation 88% or higher      Arm Ergometer   Level 1    Watts 5    RPM 20    Minutes 15      Track   Minutes 15    METs 1.95      Prescription Details   Frequency (times per week) 2    Duration Progress to 30 minutes of continuous aerobic without signs/symptoms of physical distress      Intensity   THRR 40-80% of Max Heartrate 56-112    Ratings of Perceived Exertion 11-13    Perceived Dyspnea 0-4      Progression   Progression Continue progressive overload as per policy without signs/symptoms or physical distress.      Resistance Training   Training Prescription Yes    Weight red bands    Reps 10-15             Perform Capillary Blood Glucose checks as needed.  Exercise Prescription Changes:   Exercise Prescription Changes     Row Name 06/09/22 1200 06/23/22 1500 07/02/22 1200 07/07/22 1200 07/16/22 1200      Response to Exercise   Blood Pressure (Admit) 126/70 132/74 -- 128/70 138/80   Blood Pressure (Exercise) 130/78 140/80 -- 142/80 142/70   Blood Pressure (Exit) 128/68 118/64 -- 124/64 130/70   Heart Rate (Admit) 71 bpm 77 bpm -- 73 bpm 69 bpm   Heart Rate (Exercise) 83 bpm 96 bpm -- 85 bpm 105 bpm   Heart Rate (Exit) 76 bpm 75 bpm -- 75 bpm 58 bpm   Oxygen Saturation (Admit) 97 % 94 % -- 97 % 99 %   Oxygen Saturation (Exercise) 97 % 93 % -- 96 % 94 %   Oxygen Saturation (Exit) 98 % 97 % -- 98 % 98 %   Rating of Perceived Exertion (Exercise) 14 13 -- 13 11   Perceived Dyspnea (Exercise) 3 3 -- 2 2   Duration Continue with 30 min of aerobic exercise without signs/symptoms of physical distress. Continue with 30 min of aerobic exercise without signs/symptoms of physical distress. -- Continue with 30 min of aerobic exercise without signs/symptoms of physical distress. Continue with 30 min of aerobic exercise without signs/symptoms of physical distress.   Intensity THRR unchanged THRR unchanged -- THRR unchanged THRR unchanged     Progression   Progression Continue to progress workloads to maintain intensity without signs/symptoms of physical distress. Continue to progress workloads to maintain intensity without signs/symptoms of physical distress. -- Continue to progress workloads to maintain intensity without signs/symptoms of physical distress. Continue to progress workloads to maintain intensity without signs/symptoms of physical distress.     Resistance Training   Training Prescription Yes Yes -- Yes Yes   Weight red bands red bands -- --  blue bands blue bands   Reps 10-15 10-15 -- 10-15 10-15   Time 10 Minutes 10 Minutes -- 10 Minutes 10 Minutes     Oxygen   Oxygen Continuous Continuous -- Continuous Continuous   Liters 2 2 -- 2 2     Arm Ergometer   Level 1 2 -- 3 3   Watts 1 6 -- -- 8   RPM 47 -- -- 43 --   Minutes 15 15 -- 15 15  Track   Laps -- 9 -- 9 7   Minutes 15 15 --  15 15   METs 1.92 2.39 -- 2.38 2.08     Home Exercise Plan   Plans to continue exercise at -- -- Home (comment) -- --   Frequency -- -- Add 1 additional day to program exercise sessions. -- --   Initial Home Exercises Provided -- -- 07/02/22 -- --     Oxygen   Maintain Oxygen Saturation 88% or higher 88% or higher -- 88% or higher 88% or higher            Exercise Comments:   Exercise Comments     Row Name 06/02/22 1207 07/02/22 1238         Exercise Comments Pt completed first day of exercise. He exercised on the arm ergometer at level 1 for 15 min, 1 watts, RPM 44. He then walked the track for 17.5 min for 6 laps, METs 1.69. Pt had significant SOB and hypoxia on 1L. Will watch SpO2 continuously next session. He was able to perform warm up and cooldown with verbal cues. Performed squats. Pt fatigued, tolerated exercise fair today. Discussed METs/RPMs. Will monitor for progression. Completed home exercise plan. Mario Proctor is not currently exercising at home. I encouraged him to start walking 1 non-rehab day/wk for 2x15 min or 30 min/day. He agreed with my recommendations. I also explained the difference between exercise and ADLs. Mario Proctor voiced understanding. He seems motivated to exercise and improve his functional capacity. We also discussed various places to walk. I mentioned to Mario Proctor that he can walk outside, at a fitness center, or around the grocery store. Mario Proctor will most likely walk around the grocery store.               Exercise Goals and Review:   Exercise Goals     Row Name 05/27/22 1053 06/17/22 0849 07/17/22 1224         Exercise Goals   Increase Physical Activity Yes Yes Yes     Intervention Provide advice, education, support and counseling about physical activity/exercise needs.;Develop an individualized exercise prescription for aerobic and resistive training based on initial evaluation findings, risk stratification, comorbidities and participant's personal goals.  Provide advice, education, support and counseling about physical activity/exercise needs.;Develop an individualized exercise prescription for aerobic and resistive training based on initial evaluation findings, risk stratification, comorbidities and participant's personal goals. Provide advice, education, support and counseling about physical activity/exercise needs.;Develop an individualized exercise prescription for aerobic and resistive training based on initial evaluation findings, risk stratification, comorbidities and participant's personal goals.     Expected Outcomes Short Term: Attend rehab on a regular basis to increase amount of physical activity.;Long Term: Add in home exercise to make exercise part of routine and to increase amount of physical activity.;Long Term: Exercising regularly at least 3-5 days a week. Short Term: Attend rehab on a regular basis to increase amount of physical activity.;Long Term: Add in home exercise to make exercise part of routine and to increase amount of physical activity.;Long Term: Exercising regularly at least 3-5 days a week. Short Term: Attend rehab on a regular basis to increase amount of physical activity.;Long Term: Add in home exercise to make exercise part of routine and to increase amount of physical activity.;Long Term: Exercising regularly at least 3-5 days a week.     Increase Strength and Stamina Yes Yes Yes     Intervention Provide advice, education, support and counseling about physical activity/exercise needs.;Develop  an individualized exercise prescription for aerobic and resistive training based on initial evaluation findings, risk stratification, comorbidities and participant's personal goals. Provide advice, education, support and counseling about physical activity/exercise needs.;Develop an individualized exercise prescription for aerobic and resistive training based on initial evaluation findings, risk stratification, comorbidities and  participant's personal goals. Provide advice, education, support and counseling about physical activity/exercise needs.;Develop an individualized exercise prescription for aerobic and resistive training based on initial evaluation findings, risk stratification, comorbidities and participant's personal goals.     Expected Outcomes Short Term: Increase workloads from initial exercise prescription for resistance, speed, and METs.;Short Term: Perform resistance training exercises routinely during rehab and add in resistance training at home;Long Term: Improve cardiorespiratory fitness, muscular endurance and strength as measured by increased METs and functional capacity (6MWT) Short Term: Increase workloads from initial exercise prescription for resistance, speed, and METs.;Short Term: Perform resistance training exercises routinely during rehab and add in resistance training at home;Long Term: Improve cardiorespiratory fitness, muscular endurance and strength as measured by increased METs and functional capacity (6MWT) Short Term: Increase workloads from initial exercise prescription for resistance, speed, and METs.;Short Term: Perform resistance training exercises routinely during rehab and add in resistance training at home;Long Term: Improve cardiorespiratory fitness, muscular endurance and strength as measured by increased METs and functional capacity (6MWT)     Able to understand and use rate of perceived exertion (RPE) scale Yes Yes Yes     Intervention Provide education and explanation on how to use RPE scale Provide education and explanation on how to use RPE scale Provide education and explanation on how to use RPE scale     Expected Outcomes Short Term: Able to use RPE daily in rehab to express subjective intensity level;Long Term:  Able to use RPE to guide intensity level when exercising independently Short Term: Able to use RPE daily in rehab to express subjective intensity level;Long Term:  Able to use  RPE to guide intensity level when exercising independently Short Term: Able to use RPE daily in rehab to express subjective intensity level;Long Term:  Able to use RPE to guide intensity level when exercising independently     Able to understand and use Dyspnea scale Yes Yes Yes     Intervention Provide education and explanation on how to use Dyspnea scale Provide education and explanation on how to use Dyspnea scale Provide education and explanation on how to use Dyspnea scale     Expected Outcomes Short Term: Able to use Dyspnea scale daily in rehab to express subjective sense of shortness of breath during exertion;Long Term: Able to use Dyspnea scale to guide intensity level when exercising independently Short Term: Able to use Dyspnea scale daily in rehab to express subjective sense of shortness of breath during exertion;Long Term: Able to use Dyspnea scale to guide intensity level when exercising independently Short Term: Able to use Dyspnea scale daily in rehab to express subjective sense of shortness of breath during exertion;Long Term: Able to use Dyspnea scale to guide intensity level when exercising independently     Knowledge and understanding of Target Heart Rate Range (THRR) Yes Yes Yes     Intervention Provide education and explanation of THRR including how the numbers were predicted and where they are located for reference Provide education and explanation of THRR including how the numbers were predicted and where they are located for reference Provide education and explanation of THRR including how the numbers were predicted and where they are located for reference  Expected Outcomes Short Term: Able to state/look up THRR;Long Term: Able to use THRR to govern intensity when exercising independently;Short Term: Able to use daily as guideline for intensity in rehab Short Term: Able to state/look up THRR;Long Term: Able to use THRR to govern intensity when exercising independently;Short Term:  Able to use daily as guideline for intensity in rehab Short Term: Able to state/look up THRR;Long Term: Able to use THRR to govern intensity when exercising independently;Short Term: Able to use daily as guideline for intensity in rehab     Understanding of Exercise Prescription Yes Yes Yes     Intervention Provide education, explanation, and written materials on patient's individual exercise prescription Provide education, explanation, and written materials on patient's individual exercise prescription Provide education, explanation, and written materials on patient's individual exercise prescription     Expected Outcomes Short Term: Able to explain program exercise prescription;Long Term: Able to explain home exercise prescription to exercise independently Short Term: Able to explain program exercise prescription;Long Term: Able to explain home exercise prescription to exercise independently Short Term: Able to explain program exercise prescription;Long Term: Able to explain home exercise prescription to exercise independently              Exercise Goals Re-Evaluation :  Exercise Goals Re-Evaluation     Row Name 06/17/22 0849 07/17/22 1224           Exercise Goal Re-Evaluation   Exercise Goals Review Increase Physical Activity;Able to understand and use Dyspnea scale;Understanding of Exercise Prescription;Increase Strength and Stamina;Knowledge and understanding of Target Heart Rate Range (THRR);Able to understand and use rate of perceived exertion (RPE) scale Increase Physical Activity;Able to understand and use Dyspnea scale;Understanding of Exercise Prescription;Increase Strength and Stamina;Knowledge and understanding of Target Heart Rate Range (THRR);Able to understand and use rate of perceived exertion (RPE) scale      Comments Mario Proctor has completed 4 exercise sessions. He exercises for 15 min on arm erogmeter and track. Mario Proctor averages 50 rpms at level 1.5 on the arm ergometer and 2.08  METs on the track. He performs the warmup and cooldown stnading without limitations. Mario Proctor has increased her workload on the arm ergometer. His track METs have remained relatively the same. It is too soon to notate any discernable progressions. Will continue to monitor and progress as able. Mario Proctor has completed 12 exercise sessions. He exercises for 15 min on arm erogmeter and track. Mario Proctor averages 8 watts at level 3 on the arm ergometer and 2.08 METs on the track. He performs the warmup and cooldown standing without limitations. Mario Proctor has increased his workload on the arm ergometer. His track METs have remained relatively the same despite encouragement. We have also discussed posture while walking as Mario Proctor does not stand straight. Mario Proctor continues to need verbal cues on his posture. We have discussed home exercise. Mario Proctor does not exercise at home. I recommended trying to get some walking outside of rehab. Mario Proctor agreed with my recommendations. Will continue to monitor and progress as able.      Expected Outcomes Through exercise at rehab and home, the patient will decrease shortness of breath with daily activities and feel confident in carrying out an exercise regimen at home. Through exercise at rehab and home, the patient will decrease shortness of breath with daily activities and feel confident in carrying out an exercise regimen at home.               Discharge Exercise Prescription (Final Exercise Prescription Changes):  Exercise Prescription Changes -  07/16/22 1200       Response to Exercise   Blood Pressure (Admit) 138/80    Blood Pressure (Exercise) 142/70    Blood Pressure (Exit) 130/70    Heart Rate (Admit) 69 bpm    Heart Rate (Exercise) 105 bpm    Heart Rate (Exit) 58 bpm    Oxygen Saturation (Admit) 99 %    Oxygen Saturation (Exercise) 94 %    Oxygen Saturation (Exit) 98 %    Rating of Perceived Exertion (Exercise) 11    Perceived Dyspnea (Exercise) 2    Duration Continue with 30  min of aerobic exercise without signs/symptoms of physical distress.    Intensity THRR unchanged      Progression   Progression Continue to progress workloads to maintain intensity without signs/symptoms of physical distress.      Resistance Training   Training Prescription Yes    Weight blue bands    Reps 10-15    Time 10 Minutes      Oxygen   Oxygen Continuous    Liters 2      Arm Ergometer   Level 3    Watts 8    Minutes 15      Track   Laps 7    Minutes 15    METs 2.08      Oxygen   Maintain Oxygen Saturation 88% or higher             Nutrition:  Target Goals: Understanding of nutrition guidelines, daily intake of sodium '1500mg'$ , cholesterol '200mg'$ , calories 30% from fat and 7% or less from saturated fats, daily to have 5 or more servings of fruits and vegetables.  Biometrics:  Pre Biometrics - 05/27/22 1029       Pre Biometrics   Grip Strength 18 kg              Nutrition Therapy Plan and Nutrition Goals:  Nutrition Therapy & Goals - 07/02/22 1201       Nutrition Therapy   Diet Heart Healthy Diet    Drug/Food Interactions Statins/Certain Fruits      Personal Nutrition Goals   Nutrition Goal Patient to improve diet quality by using the plate method as a guide for meal planning to include lean protein/plant protein, fruits, vegetables, whole grains, and low fat dairy as part of a well balanced diet    Personal Goal #2 Patient to limit sodium to '1500mg'$  per day    Comments Reviewed goals; Mario Proctor has had some elevated entry blood pressures at pulmonary rehab and reports elevated blood pressures at home. He reports compliance with BP meds. We discussed reducing sodium in cooking, low sodium foods, etc. Patient verbalized understanding. Mario Proctor will continue to benefit from participation in pulmonary rehab for nutrition and exercise support.      Intervention Plan   Intervention Prescribe, educate and counsel regarding individualized specific dietary  modifications aiming towards targeted core components such as weight, hypertension, lipid management, diabetes, heart failure and other comorbidities.;Nutrition handout(s) given to patient.    Expected Outcomes Short Term Goal: Understand basic principles of dietary content, such as calories, fat, sodium, cholesterol and nutrients.;Long Term Goal: Adherence to prescribed nutrition plan.             Nutrition Assessments:  Nutrition Assessments - 06/03/22 1017       Rate Your Plate Scores   Pre Score 55            MEDIFICTS Score Key: ?70 Need to make  dietary changes  40-70 Heart Healthy Diet ? 40 Therapeutic Level Cholesterol Diet  Flowsheet Row PULMONARY REHAB CHRONIC OBSTRUCTIVE PULMONARY DISEASE from 06[facility]02/2022 in [facility]  Picture Your Plate Total Score on Admission 55      Picture Your Plate Scores: [id number] Unhealthy dietary pattern with much room for improvement. 41-50 Dietary pattern unlikely to meet recommendations for good health and room for improvement. 51-60 More healthful dietary pattern, with some room for improvement.  >60 Healthy dietary pattern, although there may be some specific behaviors that could be improved.    Nutrition Goals Re-Evaluation:  Nutrition Goals Re-Evaluation     [place] Name 06/02/22 1602 07/02/22 1201           Goals   Current Weight 174 lb 6.1 oz (79.1 kg) 172 lb 6.4 oz (78.2 kg)      Comment GFR 46.4- he was referred to nephrology in June 2023; lipids WNL No new labs at this time      Expected Outcome Mario Proctor reports regular eating patters and normal appetite. He eats 2-3 meals daily and enjoys a wide variety of foods. His wife is a good support. Mario Proctor will benefit from participation in pulmonary rehab for nutrition, exercise, and lifestyle modification. Reviewed goals; Mario Proctor has had some elevated entry blood pressures at pulmonary rehab and reports elevated blood pressures at  home. He reports compliance with BP meds. We discussed reducing sodium in cooking, low sodium foods, etc. He does continue standard eating patterns and reports eating a wide variety of foods. He has maintained his weight since starting with our program.  Patient verbalized understanding. Mario Proctor will continue to benefit from participation in pulmonary rehab for nutrition and exercise support.               Nutrition Goals Discharge (Final Nutrition Goals Re-Evaluation):  Nutrition Goals Re-Evaluation - 07/02/22 1201       Goals   Current Weight 172 lb 6.4 oz (78.2 kg)    Comment No new labs at this time    Expected Outcome Reviewed goals; Mario Proctor has had some elevated entry blood pressures at pulmonary rehab and reports elevated blood pressures at home. He reports compliance with BP meds. We discussed reducing sodium in cooking, low sodium foods, etc. He does continue standard eating patterns and reports eating a wide variety of foods. He has maintained his weight since starting with our program.  Patient verbalized understanding. Mario Proctor will continue to benefit from participation in pulmonary rehab for nutrition and exercise support.             Psychosocial: Target Goals: Acknowledge presence or absence of significant depression and/or stress, maximize coping skills, provide positive support system. Participant is able to verbalize types and ability to use techniques and skills needed for reducing stress and depression.  Initial Review & Psychosocial Screening:  Initial Psych Review & Screening - 05/27/22 1040       Initial Review   Current issues with None Identified      Family Dynamics   Good Support System? Yes    Comments wife      Barriers   Psychosocial barriers to participate in program There are no identifiable barriers or psychosocial needs.      Screening Interventions   Interventions Encouraged to exercise             Quality of Life Scores:  Scores of 19  and below usually indicate a poorer  quality of life in these areas.  A difference of  2-3 points is a clinically meaningful difference.  A difference of 2-3 points in the total score of the Quality of Life Index has been associated with significant improvement in overall quality of life, self-image, physical symptoms, and general health in studies assessing change in quality of life.  PHQ-9: Review Flowsheet       06/24/2022 05/27/2022  Depression screen PHQ 2/9  Decreased Interest 0 0  Down, Depressed, Hopeless 0 0  PHQ - 2 Score 0 0  Altered sleeping 0 0  Tired, decreased energy 1 1  Change in appetite 0 0  Feeling bad or failure about yourself  0 0  Trouble concentrating 0 0  Moving slowly or fidgety/restless 0 0  Suicidal thoughts 0 0  PHQ-9 Score 1 1  Difficult doing work/chores - Not difficult at all   Interpretation of Total Score  Total Score Depression Severity:  1-4 = Minimal depression, 5-9 = Mild depression, 10-14 = Moderate depression, 15-19 = Moderately severe depression, 20-27 = Severe depression   Psychosocial Evaluation and Intervention:  Psychosocial Evaluation - 05/27/22 1042       Psychosocial Evaluation & Interventions   Interventions Stress management education;Relaxation education;Encouraged to exercise with the program and follow exercise prescription    Comments Pt denies psychosocial concerns.    Expected Outcomes For pt to participate in rehab free of psychosocial concerns    Continue Psychosocial Services  No Follow up required             Psychosocial Re-Evaluation:  Psychosocial Re-Evaluation     [place] Name 06/19/22 1000 07/15/22 1354           Psychosocial Re-Evaluation   Current issues with None Identified None Identified      Comments Mario Proctor currently denies any psychosocial barriers or concerns. We have discussed with Mario Proctor the benefits of quitting smoking, but he is not ready to fully commit. We look forward to helping Mario Proctor achieve his  goals and will continue to assess him for any psychosocial needs that may arise. Mario Proctor currently denies any psychosocial barriers or concerns at this time.      Expected Outcomes For Mario Proctor to continue to attend PR without any psychosocial barriers or concerns. For Mario Proctor to continue to attend PR without any psychosocial barriers or concerns.      Interventions Encouraged to attend Pulmonary Rehabilitation for the exercise Encouraged to attend Pulmonary Rehabilitation for the exercise      Continue Psychosocial Services  No Follow up required No Follow up required               Psychosocial Discharge (Final Psychosocial Re-Evaluation):  Psychosocial Re-Evaluation - 07/15/22 1354       Psychosocial Re-Evaluation   Current issues with None Identified    Comments Mario Proctor currently denies any psychosocial barriers or concerns at this time.    Expected Outcomes For Mario Proctor to continue to attend PR without any psychosocial barriers or concerns.    Interventions Encouraged to attend Pulmonary Rehabilitation for the exercise    Continue Psychosocial Services  No Follow up required             Education: Education Goals: Education classes will be provided on a weekly basis, covering required topics. Participant will state understanding/return demonstration of topics presented.  Learning Barriers/Preferences:  Learning Barriers/Preferences - 05/27/22 1044       Learning Barriers/Preferences   Learning Barriers Hearing   had hearing  aids   Learning Preferences None             Education Topics: Introduction to Pulmonary Rehab Group instruction provided by PowerPoint, verbal discussion, and written material to support subject matter. Instructor reviews what Pulmonary Rehab is, the purpose of the program, and how patients are referred.     Know Your Numbers Group instruction that is supported by a PowerPoint presentation. Instructor discusses importance of knowing and understanding  resting, exercise, and post-exercise oxygen saturation, heart rate, and blood pressure. Oxygen saturation, heart rate, blood pressure, rating of perceived exertion, and dyspnea are reviewed along with a normal range for these values.    Exercise for the Pulmonary Patient Group instruction that is supported by a PowerPoint presentation. Instructor discusses benefits of exercise, core components of exercise, frequency, duration, and intensity of an exercise routine, importance of utilizing pulse oximetry during exercise, safety while exercising, and options of places to exercise outside of rehab.       MET Level  Group instruction provided by PowerPoint, verbal discussion, and written material to support subject matter. Instructor reviews what METs are and how to increase METs.    Pulmonary Medications Verbally interactive group education provided by instructor with focus on inhaled medications and proper administration.   Anatomy and Physiology of the Respiratory System Group instruction provided by PowerPoint, verbal discussion, and written material to support subject matter. Instructor reviews respiratory cycle and anatomical components of the respiratory system and their functions. Instructor also reviews differences in obstructive and restrictive respiratory diseases with examples of each.  Flowsheet Row PULMONARY REHAB CHRONIC OBSTRUCTIVE PULMONARY DISEASE from 07/16/2022 in [facility], [facility]  Date 07/16/22  Educator Ep  Instruction Review Code 1- Verbalizes Understanding       Oxygen Safety Group instruction provided by PowerPoint, verbal discussion, and written material to support subject matter. There is an overview of "What is Oxygen" and "Why do we need it".  Instructor also reviews how to create a safe environment for oxygen use, the importance of using oxygen as prescribed, and the risks of noncompliance. There is a brief  discussion on traveling with oxygen and resources the patient may utilize. Flowsheet Row PULMONARY REHAB CHRONIC OBSTRUCTIVE PULMONARY DISEASE from 06/04/2022 in [facility], [facility], [facility]  Date 06/04/22  Educator EP  Instruction Review Code 1- Verbalizes Understanding       Oxygen Use Group instruction provided by PowerPoint, verbal discussion, and written material to discuss how supplemental oxygen is prescribed and different types of oxygen supply systems. Resources for more information are provided.  Flowsheet Row PULMONARY REHAB CHRONIC OBSTRUCTIVE PULMONARY DISEASE from 06/11/2022 in [facility] for Heart, Vascular, & Lung Health  Date 06/11/22  Educator EP  Instruction Review Code 1- Verbalizes Understanding       Breathing Techniques Group instruction that is supported by demonstration and informational handouts. Instructor discusses the benefits of pursed lip and diaphragmatic breathing and detailed demonstration on how to perform both.  Flowsheet Row PULMONARY REHAB CHRONIC OBSTRUCTIVE PULMONARY DISEASE from 06/18/2022 in [facility], Vascular, & Lung [facility]  Date 06/18/22  Educator EP  Instruction Review Code 1- Verbalizes Understanding        Risk Factor Reduction Group instruction that is supported by a PowerPoint presentation. Instructor discusses the definition of a risk factor, different risk factors for pulmonary disease, and how the heart and  lungs work together.   MD Day A group question and answer session with a medical doctor that allows participants to ask questions that relate to their pulmonary disease state.   Nutrition for the Pulmonary Patient Group instruction provided by PowerPoint slides, verbal discussion, and written materials to support subject matter. The instructor gives an explanation and review of healthy diet recommendations, which includes a  discussion on weight management, recommendations for fruit and vegetable consumption, as well as protein, fluid, caffeine, fiber, sodium, sugar, and alcohol. Tips for eating when patients are short of breath are discussed.    Other Education Group or individual verbal, written, or video instructions that support the educational goals of the pulmonary rehab program. Flowsheet Row PULMONARY REHAB CHRONIC OBSTRUCTIVE PULMONARY DISEASE from 07/07/2022 in [facility] for [facility], Vascular, & [facility]  Date 07/07/22  Educator EP  Instruction Review Code 1- Verbalizes Understanding        Knowledge Questionnaire Score:  Knowledge Questionnaire Score - 05/27/22 1205       Knowledge Questionnaire Score   Pre Score 14/18             Core Components/Risk Factors/Patient Goals at Admission:  Personal Goals and Risk Factors at Admission - 05/27/22 1044       Core Components/Risk Factors/Patient Goals on Admission   Improve shortness of breath with ADL's Yes    Intervention Provide education, individualized exercise plan and daily activity instruction to help decrease symptoms of SOB with activities of daily living.    Expected Outcomes Short Term: Improve cardiorespiratory fitness to achieve a reduction of symptoms when performing ADLs;Long Term: Be able to perform more ADLs without symptoms or delay the onset of symptoms    Heart Failure Yes    Intervention Provide a combined exercise and nutrition program that is supplemented with education, support and counseling about heart failure. Directed toward relieving symptoms such as shortness of breath, decreased exercise tolerance, and extremity edema.    Expected Outcomes Improve functional capacity of life;Short term: Attendance in program 2-3 days a week with increased exercise capacity. Reported lower sodium intake. Reported increased fruit and vegetable intake. Reports medication compliance.;Short term: Daily weights  obtained and reported for increase. Utilizing diuretic protocols set by physician.             Core Components/Risk Factors/Patient Goals Review:   Goals and Risk Factor Review     Row Name 06/19/22 1005 07/15/22 1356           Core Components/Risk Factors/Patient Goals Review   Personal Goals Review Heart Failure;Improve shortness of breath with ADL's;Develop more efficient breathing techniques such as purse lipped breathing and diaphragmatic breathing and practicing self-pacing with activity. Improve shortness of breath with ADL's;Develop more efficient breathing techniques such as purse lipped breathing and diaphragmatic breathing and practicing self-pacing with activity.;Increase knowledge of respiratory medications and ability to use respiratory devices properly.      Review Mario Proctor has attended only 5 sessions so far and we have already seen improvement with how far he can walk without becoming short of breath. Mario Proctor has also demonstrated how to use pursed lip breathing without any prompting. He is currently exercising by walking the track and on the arm ergometer. His heart failure has been stable without any complications. He is following a low sodium diet and weighing himself daily. Mario Proctor has attended the breathing techniques education class and is working hard at building up his endurance. Mario Proctor likes coming to class  and states he feels a "whole lot better than I have in years". Mario Proctor has attended 10 sessions so far. He is currently exercising on the arm ergometer and the track. He has been able to increase his workload and METs on the arm ergometer and laps on the track. Mario Proctor has attended warning signs and symptoms and beating the sedentary lifestyle classes.  His heart failure has been stable without any complications. Mario Proctor has been practicing pursed lip breathing when SOB.  Mario Proctor likes coming to class and feels it is helping build his endurance.      Expected Outcomes See Admission  Goals See admission goals               Core Components/Risk Factors/Patient Goals at Discharge (Final Review):   Goals and Risk Factor Review - 07/15/22 1356       Core Components/Risk Factors/Patient Goals Review   Personal Goals Review Improve shortness of breath with ADL's;Develop more efficient breathing techniques such as purse lipped breathing and diaphragmatic breathing and practicing self-pacing with activity.;Increase knowledge of respiratory medications and ability to use respiratory devices properly.    Review Mario Proctor has attended 10 sessions so far. He is currently exercising on the arm ergometer and the track. He has been able to increase his workload and METs on the arm ergometer and laps on the track. Mario Proctor has attended warning signs and symptoms and beating the sedentary lifestyle classes.  His heart failure has been stable without any complications. Mario Proctor has been practicing pursed lip breathing when SOB.  Mario Proctor likes coming to class and feels it is helping build his endurance.    Expected Outcomes See admission goals             ITP Comments:   Comments: Dr. Rodman Pickle is Medical Director for Pulmonary Rehab at [facility].

## 2022-07-23 ENCOUNTER — Encounter (HOSPITAL_COMMUNITY)
Admission: RE | Admit: 2022-07-23 | Discharge: 2022-07-23 | Disposition: A | Discharge status: Home / Self Care | Payer: Medicare HMO | Source: Ambulatory Visit | Attending: Student | Admitting: Student

## 2022-07-23 DIAGNOSIS — J441 Chronic obstructive pulmonary disease with (acute) exacerbation: Secondary | ICD-10-CM | POA: Diagnosis not present

## 2022-07-23 DIAGNOSIS — Z5189 Encounter for other specified aftercare: Secondary | ICD-10-CM | POA: Diagnosis not present

## 2022-07-23 DIAGNOSIS — J449 Chronic obstructive pulmonary disease, unspecified: Secondary | ICD-10-CM

## 2022-07-23 NOTE — Progress Notes (Signed)
Daily Session Note  Patient Details  Name: Mario Proctor MRN: [id number] Date of Birth: [birth year] Referring Provider:   April Proctor Pulmonary Rehab Walk Test from 05/27/2022 in [facility], [facility]  Referring Provider Mario Proctor       Encounter Date: 07/23/2022  Check In:  Session Check In - 07/23/22 1203       Check-In   Supervising physician immediately available to respond to emergencies [facility] MD immediately available    Physician(s) Mario Proctor    Location [facility]    Staff Present Mario Ores, RN, Mario Ore, MS, ACSM-CEP, Exercise Physiologist;Mario Proctor, ACSM-CEP, Exercise Physiologist;Mario Proctor, RD, LDN;Other    Virtual Visit No    Medication changes reported     No    Fall or balance concerns reported    No    Tobacco Cessation No Change    Warm-up and Cool-down Performed as group-led instruction    Resistance Training Performed Yes    VAD Patient? No    PAD/SET Patient? No      Pain Assessment   Currently in Pain? No/denies    Multiple Pain Sites No             Capillary Blood Glucose: No results found for this or any previous visit (from the past 24 hour(s)).    Social History   Tobacco Use  Smoking Status Every Day   Packs/day: 0.25   Years: 50.00   Additional pack years: 0.00   Total pack years: 12.50   Types: Cigarettes   Passive exposure: Current  Smokeless Tobacco Never  Tobacco Comments   1/4-1/2 ppd currently    Goals Met:  Proper associated with RPD/PD & O2 Sat Exercise tolerated well No report of concerns or symptoms today Strength training completed today  Goals Unmet:  Not Applicable  Comments: Service time is from 1010 to 1149.    Mario Proctor is Medical Director for Pulmonary Rehab at [facility].

## 2022-07-28 ENCOUNTER — Other Ambulatory Visit: Payer: Self-pay | Admitting: Pulmonary Disease

## 2022-07-28 ENCOUNTER — Encounter (HOSPITAL_COMMUNITY)
Admission: RE | Admit: 2022-07-28 | Discharge: 2022-07-28 | Disposition: A | Discharge status: Home / Self Care | Payer: Medicare HMO | Source: Ambulatory Visit | Attending: Student | Admitting: Student

## 2022-07-28 VITALS — Wt 176.4 lb

## 2022-07-28 DIAGNOSIS — J441 Chronic obstructive pulmonary disease with (acute) exacerbation: Secondary | ICD-10-CM | POA: Diagnosis not present

## 2022-07-28 DIAGNOSIS — Z5189 Encounter for other specified aftercare: Secondary | ICD-10-CM | POA: Diagnosis not present

## 2022-07-28 DIAGNOSIS — J449 Chronic obstructive pulmonary disease, unspecified: Secondary | ICD-10-CM

## 2022-07-28 NOTE — Progress Notes (Signed)
Daily Session Note  Patient Details  Name: Mario Proctor MRN: [id number] Date of Birth: 06-14-1940 Referring Provider:   April Manson Pulmonary Rehab Walk Test from 05/27/2022 in [facility]  Referring Provider [name]       Encounter Date: 07/28/2022  Check In:  Session Check In - 07/28/22 1207       Check-In   Supervising physician immediately available to respond to emergencies [facility] MD immediately available    Physician(s) Eric Form, NP    Location [facility]    Staff Present Janine Ores, RN, Quentin Ore, MS, ACSM-CEP, Exercise Physiologist;Other    Virtual Visit No    Medication changes reported     No    Fall or balance concerns reported    No    Tobacco Cessation No Change    Warm-up and Cool-down Performed as group-led instruction    Resistance Training Performed Yes    VAD Patient? No    PAD/SET Patient? No      Pain Assessment   Currently in Pain? No/denies    Multiple Pain Sites No             Capillary Blood Glucose: No results found for this or any previous visit (from the past 24 hour(s)).   Exercise Prescription Changes - 07/28/22 1200       Response to Exercise   Blood Pressure (Admit) 134/74    Blood Pressure (Exercise) 148/70    Blood Pressure (Exit) 130/60    Heart Rate (Admit) 63 bpm    Heart Rate (Exercise) 93 bpm    Heart Rate (Exit) 76 bpm    Oxygen Saturation (Admit) 97 %    Oxygen Saturation (Exercise) 93 %    Oxygen Saturation (Exit) 95 %    Rating of Perceived Exertion (Exercise) 11    Perceived Dyspnea (Exercise) 2    Duration Continue with 30 min of aerobic exercise without signs/symptoms of physical distress.    Intensity THRR unchanged      Progression   Progression Continue to progress workloads to maintain intensity without signs/symptoms of physical distress.      Resistance Training   Training Prescription Yes    Weight blue bands     Reps 10-15    Time 10 Minutes      Oxygen   Oxygen Continuous    Liters 2      Arm Ergometer   Level 4    Watts 9    Minutes 15      Track   Laps 7    Minutes 15    METs 2.08      Oxygen   Maintain Oxygen Saturation 88% or higher             Social History   Tobacco Use  Smoking Status Every Day   Packs/day: 0.25   Years: 50.00   Additional pack years: 0.00   Total pack years: 12.50   Types: Cigarettes   Passive exposure: Current  Smokeless Tobacco Never  Tobacco Comments   1/4-1/2 ppd currently    Goals Met:  Independence with exercise equipment Exercise tolerated well No report of concerns or symptoms today Strength training completed today  Goals Unmet:  Not Applicable  Comments: Service time is from 1010 to 1143    Dr. Rodman Pickle is Medical Director for Pulmonary Rehab at [facility].

## 2022-07-29 ENCOUNTER — Ambulatory Visit: Payer: Medicare HMO | Admitting: Internal Medicine

## 2022-07-30 ENCOUNTER — Encounter (HOSPITAL_COMMUNITY)
Admission: RE | Admit: 2022-07-30 | Discharge: 2022-07-30 | Disposition: A | Discharge status: Home / Self Care | Payer: Medicare HMO | Source: Ambulatory Visit | Attending: Student | Admitting: Student

## 2022-07-30 DIAGNOSIS — J441 Chronic obstructive pulmonary disease with (acute) exacerbation: Secondary | ICD-10-CM | POA: Diagnosis not present

## 2022-07-30 DIAGNOSIS — Z5189 Encounter for other specified aftercare: Secondary | ICD-10-CM | POA: Diagnosis not present

## 2022-07-30 DIAGNOSIS — J449 Chronic obstructive pulmonary disease, unspecified: Secondary | ICD-10-CM

## 2022-07-30 NOTE — Progress Notes (Signed)
Daily Session Note  Patient Details  Name: Mario Proctor MRN: [id number] Date of Birth: 09/23/1940 Referring Provider:   April Manson Pulmonary Rehab Walk Test from 05/27/2022 in [facility]  Referring Provider [name]       Encounter Date: 07/30/2022  Check In:  Session Check In - 07/30/22 1219       Check-In   Supervising physician immediately available to respond to emergencies [facility] MD immediately available    Physician(s) Dr Marlyce Huge    Location [facility]    Staff Present Janine Ores, RN, Quentin Ore, MS, ACSM-CEP, Exercise Physiologist;Other;Samantha [place], [place], LDN    Virtual Visit No    Medication changes reported     No    Fall or balance concerns reported    No    Tobacco Cessation No Change    Warm-up and Cool-down Performed as group-led instruction    Resistance Training Performed Yes    VAD Patient? No    PAD/SET Patient? No      Pain Assessment   Currently in Pain? No/denies    Multiple Pain Sites No             Capillary Blood Glucose: No results found for this or any previous visit (from the past 24 hour(s)).    Social History   Tobacco Use  Smoking Status Every Day   Packs/day: 0.25   Years: 50.00   Additional pack years: 0.00   Total pack years: 12.50   Types: Cigarettes   Passive exposure: Current  Smokeless Tobacco Never  Tobacco Comments   1/4-1/2 ppd currently    Goals Met:  Proper associated with RPD/PD & O2 Sat Independence with exercise equipment Exercise tolerated well No report of concerns or symptoms today Strength training completed today  Goals Unmet:  Not Applicable  Comments: Service time is from 1006 to 1144.    Dr. Rodman Pickle is Medical Director for Pulmonary Rehab at [facility].

## 2022-07-31 NOTE — Progress Notes (Signed)
Discharge Progress Report  Patient Details  Name: Mario Proctor MRN: [id number] Date of Birth: 29-Jun-1940 Referring Provider:   April Manson Pulmonary Rehab Walk Test from 05/27/2022 in [facility] [place]  Referring Provider [name]        Number of Visits: 16  Reason for Discharge:  Patient reached a stable level of exercise. Patient independent in their exercise. Patient has met program and personal goals.  Smoking History:  Social History   Tobacco Use  Smoking Status Every Day   Packs/day: 0.25   Years: 50.00   Additional pack years: 0.00   Total pack years: 12.50   Types: Cigarettes   Passive exposure: Current  Smokeless Tobacco Never  Tobacco Comments   1/4-1/2 ppd currently    Diagnosis:  Stage 2 moderate COPD by GOLD classification ([place])  ADL [facility]:  Pulmonary Assessment Scores     Row Name 05/27/22 1050 07/23/22 1616 07/30/22 1558     ADL [facility]   ADL Phase Entry Exit --   SOB Score total 49 34 --     CAT Score   CAT Score 15 12 --     mMRC Score   mMRC Score 4 -- 4            Initial Exercise Prescription:  Initial Exercise Prescription - 05/27/22 1100       Date of Initial Exercise RX and Referring Provider   Date 05/27/22    Referring Provider [name]    Expected Discharge Date 07/30/22      Oxygen   Oxygen Continuous    Liters 1    Maintain Oxygen Saturation 88% or higher      Arm Ergometer   Level 1    Watts 5    RPM 20    Minutes 15      Track   Minutes 15    METs 1.95      Prescription Details   Frequency (times per week) 2    Duration Progress to 30 minutes of continuous aerobic without signs/symptoms of physical distress      Intensity   THRR 40-80% of Max Heartrate 56-112    Ratings of Perceived Exertion 11-13    Perceived Dyspnea 0-4      Progression   Progression Continue progressive overload as per policy without signs/symptoms or physical distress.       Resistance Training   Training Prescription Yes    Weight red bands    Reps 10-15             Discharge Exercise Prescription (Final Exercise Prescription Changes):  Exercise Prescription Changes - 07/28/22 1200       Response to Exercise   Blood Pressure (Admit) 134/74    Blood Pressure (Exercise) 148/70    Blood Pressure (Exit) 130/60    Heart Rate (Admit) 63 bpm    Heart Rate (Exercise) 93 bpm    Heart Rate (Exit) 76 bpm    Oxygen Saturation (Admit) 97 %    Oxygen Saturation (Exercise) 93 %    Oxygen Saturation (Exit) 95 %    Rating of Perceived Exertion (Exercise) 11    Perceived Dyspnea (Exercise) 2    Duration Continue with 30 min of aerobic exercise without signs/symptoms of physical distress.    Intensity THRR unchanged      Progression   Progression Continue to progress workloads to maintain intensity without signs/symptoms of physical distress.  Resistance Training   Training Prescription Yes    Weight blue bands    Reps 10-15    Time 10 Minutes      Oxygen   Oxygen Continuous    Liters 2      Arm Ergometer   Level 4    Watts 9    Minutes 15      Track   Laps 7    Minutes 15    METs 2.08      Oxygen   Maintain Oxygen Saturation 88% or higher             Functional Capacity:  6 Minute Walk     Row Name 05/27/22 1148 07/30/22 1554       6 Minute Walk   Phase Initial Discharge    Distance 860 feet 1110 feet    Distance % Change -- 29.07 %    Distance Feet Change -- 250 ft    Walk Time 6 minutes 6 minutes    # of Rest Breaks 2  1:45-2:38, 3:24-4:45 0    MPH 1.63 2.1    METS 1.95 2.55    RPE 13 13    Perceived Dyspnea  1 2    VO2 Peak 6.83 8.94    Symptoms No No    Resting HR 79 bpm 69 bpm    Resting BP 158/90 148/72    Resting Oxygen Saturation  96 % 97 %    Exercise Oxygen Saturation  during 6 min walk 85 % 92 %    Max Ex. HR 87 bpm 107 bpm    Max Ex. BP 164/84 156/70    2 Minute Post BP 140/80 142/62      Interval HR    1 Minute HR 84 82    2 Minute HR 84 85    3 Minute HR 86 95    4 Minute HR 8 101    5 Minute HR 85 107    6 Minute HR 80 107    2 Minute Post HR 78 75    Interval Heart Rate? Yes Yes      Interval Oxygen   Interval Oxygen? Yes Yes    Baseline Oxygen Saturation % 96 % 97 %    1 Minute Oxygen Saturation % 95 % 100 %    1 Minute Liters of Oxygen 0 L 2 L    2 Minute Oxygen Saturation % 88 % 98 %    2 Minute Liters of Oxygen 0 L 2 L    3 Minute Oxygen Saturation % 89 % 96 %    3 Minute Liters of Oxygen 0 L 2 L    4 Minute Oxygen Saturation % 88 % 94 %    4 Minute Liters of Oxygen 0 L 2 L    5 Minute Oxygen Saturation % 89 %  85 @ 5:47 93 %    5 Minute Liters of Oxygen 0 L  Increased to 1L 2 L    6 Minute Oxygen Saturation % 95 % 92 %    6 Minute Liters of Oxygen 0 L 2 L    2 Minute Post Oxygen Saturation % 96 % 99 %    2 Minute Post Liters of Oxygen 0 L 2 L             Psychological, QOL, Others - Outcomes: PHQ 2/9:    07/23/2022    4:21 PM 06/24/2022    9:11 AM 05/27/2022  11:50 AM  Depression screen PHQ 2/9  Decreased Interest 0 0 0  Down, Depressed, Hopeless 0 0 0  PHQ - 2 Score 0 0 0  Altered sleeping 0 0 0  Tired, decreased energy 0 1 1  Change in appetite 0 0 0  Feeling bad or failure about yourself  0 0 0  Trouble concentrating 0 0 0  Moving slowly or fidgety/restless 0 0 0  Suicidal thoughts 0 0 0  PHQ-9 Score 0 1 1  Difficult doing work/chores Not difficult at all  Not difficult at all    Quality of Life:   Personal Goals: Goals established at orientation with interventions provided to work toward goal.  Personal Goals and Risk Factors at Admission - 05/27/22 1044       Core Components/Risk Factors/Patient Goals on Admission   Improve shortness of breath with ADL's Yes    Intervention Provide education, individualized exercise plan and daily activity instruction to help decrease symptoms of SOB with activities of daily living.    Expected  Outcomes Short Term: Improve cardiorespiratory fitness to achieve a reduction of symptoms when performing ADLs;Long Term: Be able to perform more ADLs without symptoms or delay the onset of symptoms    Heart Failure Yes    Intervention Provide a combined exercise and nutrition program that is supplemented with education, support and counseling about heart failure. Directed toward relieving symptoms such as shortness of breath, decreased exercise tolerance, and extremity edema.    Expected Outcomes Improve functional capacity of life;Short term: Attendance in program 2-3 days a week with increased exercise capacity. Reported lower sodium intake. Reported increased fruit and vegetable intake. Reports medication compliance.;Short term: Daily weights obtained and reported for increase. Utilizing diuretic protocols set by physician.              Personal Goals Discharge:  Goals and Risk Factor Review     Row Name 06/19/22 1005 07/15/22 1356           Core Components/Risk Factors/Patient Goals Review   Personal Goals Review Heart Failure;Improve shortness of breath with ADL's;Develop more efficient breathing techniques such as purse lipped breathing and diaphragmatic breathing and practicing self-pacing with activity. Improve shortness of breath with ADL's;Develop more efficient breathing techniques such as purse lipped breathing and diaphragmatic breathing and practicing self-pacing with activity.;Increase knowledge of respiratory medications and ability to use respiratory devices properly.      Review Gwynn has attended only 5 sessions so far and we have already seen improvement with how far he can walk without becoming short of breath. Gwynn has also demonstrated how to use pursed lip breathing without any prompting. He is currently exercising by walking the track and on the arm ergometer. His heart failure has been stable without any complications. He is following a low sodium diet and weighing  himself daily. Gwynn has attended the breathing techniques education class and is working hard at building up his endurance. Gwynn likes coming to class and states he feels a "whole lot better than I have in years". Gwynn has attended 10 sessions so far. He is currently exercising on the arm ergometer and the track. He has been able to increase his workload and METs on the arm ergometer and laps on the track. Gwynn has attended warning signs and symptoms and beating the sedentary lifestyle classes.  His heart failure has been stable without any complications. Gwynn has been practicing pursed lip breathing when SOB.  Gwynn likes coming to class  and feels it is helping build his endurance.      Expected Outcomes See Admission Goals See admission goals               Exercise Goals and Review:  Exercise Goals     Row Name 05/27/22 1053 06/17/22 0849 07/17/22 1224         Exercise Goals   Increase Physical Activity Yes Yes Yes     Intervention Provide advice, education, support and counseling about physical activity/exercise needs.;Develop an individualized exercise prescription for aerobic and resistive training based on initial evaluation findings, risk stratification, comorbidities and participant's personal goals. Provide advice, education, support and counseling about physical activity/exercise needs.;Develop an individualized exercise prescription for aerobic and resistive training based on initial evaluation findings, risk stratification, comorbidities and participant's personal goals. Provide advice, education, support and counseling about physical activity/exercise needs.;Develop an individualized exercise prescription for aerobic and resistive training based on initial evaluation findings, risk stratification, comorbidities and participant's personal goals.     Expected Outcomes Short Term: Attend rehab on a regular basis to increase amount of physical activity.;Long Term: Add in home  exercise to make exercise part of routine and to increase amount of physical activity.;Long Term: Exercising regularly at least 3-5 days a week. Short Term: Attend rehab on a regular basis to increase amount of physical activity.;Long Term: Add in home exercise to make exercise part of routine and to increase amount of physical activity.;Long Term: Exercising regularly at least 3-5 days a week. Short Term: Attend rehab on a regular basis to increase amount of physical activity.;Long Term: Add in home exercise to make exercise part of routine and to increase amount of physical activity.;Long Term: Exercising regularly at least 3-5 days a week.     Increase Strength and Stamina Yes Yes Yes     Intervention Provide advice, education, support and counseling about physical activity/exercise needs.;Develop an individualized exercise prescription for aerobic and resistive training based on initial evaluation findings, risk stratification, comorbidities and participant's personal goals. Provide advice, education, support and counseling about physical activity/exercise needs.;Develop an individualized exercise prescription for aerobic and resistive training based on initial evaluation findings, risk stratification, comorbidities and participant's personal goals. Provide advice, education, support and counseling about physical activity/exercise needs.;Develop an individualized exercise prescription for aerobic and resistive training based on initial evaluation findings, risk stratification, comorbidities and participant's personal goals.     Expected Outcomes Short Term: Increase workloads from initial exercise prescription for resistance, speed, and METs.;Short Term: Perform resistance training exercises routinely during rehab and add in resistance training at home;Long Term: Improve cardiorespiratory fitness, muscular endurance and strength as measured by increased METs and functional capacity (6MWT) Short Term: Increase  workloads from initial exercise prescription for resistance, speed, and METs.;Short Term: Perform resistance training exercises routinely during rehab and add in resistance training at home;Long Term: Improve cardiorespiratory fitness, muscular endurance and strength as measured by increased METs and functional capacity (6MWT) Short Term: Increase workloads from initial exercise prescription for resistance, speed, and METs.;Short Term: Perform resistance training exercises routinely during rehab and add in resistance training at home;Long Term: Improve cardiorespiratory fitness, muscular endurance and strength as measured by increased METs and functional capacity (6MWT)     Able to understand and use rate of perceived exertion (RPE) scale Yes Yes Yes     Intervention Provide education and explanation on how to use RPE scale Provide education and explanation on how to use RPE scale Provide education and explanation on how  to use RPE scale     Expected Outcomes Short Term: Able to use RPE daily in rehab to express subjective intensity level;Long Term:  Able to use RPE to guide intensity level when exercising independently Short Term: Able to use RPE daily in rehab to express subjective intensity level;Long Term:  Able to use RPE to guide intensity level when exercising independently Short Term: Able to use RPE daily in rehab to express subjective intensity level;Long Term:  Able to use RPE to guide intensity level when exercising independently     Able to understand and use Dyspnea scale Yes Yes Yes     Intervention Provide education and explanation on how to use Dyspnea scale Provide education and explanation on how to use Dyspnea scale Provide education and explanation on how to use Dyspnea scale     Expected Outcomes Short Term: Able to use Dyspnea scale daily in rehab to express subjective sense of shortness of breath during exertion;Long Term: Able to use Dyspnea scale to guide intensity level when  exercising independently Short Term: Able to use Dyspnea scale daily in rehab to express subjective sense of shortness of breath during exertion;Long Term: Able to use Dyspnea scale to guide intensity level when exercising independently Short Term: Able to use Dyspnea scale daily in rehab to express subjective sense of shortness of breath during exertion;Long Term: Able to use Dyspnea scale to guide intensity level when exercising independently     Knowledge and understanding of Target Heart Rate Range (THRR) Yes Yes Yes     Intervention Provide education and explanation of THRR including how the numbers were predicted and where they are located for reference Provide education and explanation of THRR including how the numbers were predicted and where they are located for reference Provide education and explanation of THRR including how the numbers were predicted and where they are located for reference     Expected Outcomes Short Term: Able to state/look up THRR;Long Term: Able to use THRR to govern intensity when exercising independently;Short Term: Able to use daily as guideline for intensity in rehab Short Term: Able to state/look up THRR;Long Term: Able to use THRR to govern intensity when exercising independently;Short Term: Able to use daily as guideline for intensity in rehab Short Term: Able to state/look up THRR;Long Term: Able to use THRR to govern intensity when exercising independently;Short Term: Able to use daily as guideline for intensity in rehab     Understanding of Exercise Prescription Yes Yes Yes     Intervention Provide education, explanation, and written materials on patient's individual exercise prescription Provide education, explanation, and written materials on patient's individual exercise prescription Provide education, explanation, and written materials on patient's individual exercise prescription     Expected Outcomes Short Term: Able to explain program exercise prescription;Long  Term: Able to explain home exercise prescription to exercise independently Short Term: Able to explain program exercise prescription;Long Term: Able to explain home exercise prescription to exercise independently Short Term: Able to explain program exercise prescription;Long Term: Able to explain home exercise prescription to exercise independently              Exercise Goals Re-Evaluation:  Exercise Goals Re-Evaluation     Row Name 06/17/22 0849 07/17/22 1224           Exercise Goal Re-Evaluation   Exercise Goals Review Increase Physical Activity;Able to understand and use Dyspnea scale;Understanding of Exercise Prescription;Increase Strength and Stamina;Knowledge and understanding of Target Heart Rate Range (THRR);Able to understand  and use rate of perceived exertion (RPE) scale Increase Physical Activity;Able to understand and use Dyspnea scale;Understanding of Exercise Prescription;Increase Strength and Stamina;Knowledge and understanding of Target Heart Rate Range (THRR);Able to understand and use rate of perceived exertion (RPE) scale      Comments Gwynn has completed 4 exercise sessions. He exercises for 15 min on arm erogmeter and track. Gwynn averages 50 rpms at level 1.5 on the arm ergometer and 2.08 METs on the track. He performs the warmup and cooldown stnading without limitations. Gwynn has increased her workload on the arm ergometer. His track METs have remained relatively the same. It is too soon to notate any discernable progressions. Will continue to monitor and progress as able. Gwynn has completed 12 exercise sessions. He exercises for 15 min on arm erogmeter and track. Gwynn averages 8 watts at level 3 on the arm ergometer and 2.08 METs on the track. He performs the warmup and cooldown standing without limitations. Gwynn has increased his workload on the arm ergometer. His track METs have remained relatively the same despite encouragement. We have also discussed posture while  walking as Gwynn does not stand straight. Gwynn continues to need verbal cues on his posture. We have discussed home exercise. Gwynn does not exercise at home. I recommended trying to get some walking outside of rehab. Gwynn agreed with my recommendations. Will continue to monitor and progress as able.      Expected Outcomes Through exercise at rehab and home, the patient will decrease shortness of breath with daily activities and feel confident in carrying out an exercise regimen at home. Through exercise at rehab and home, the patient will decrease shortness of breath with daily activities and feel confident in carrying out an exercise regimen at home.               Nutrition & Weight - Outcomes:  Pre Biometrics - 05/27/22 1029       Pre Biometrics   Grip Strength 18 kg              Nutrition:  Nutrition Therapy & Goals - 07/02/22 1201       Nutrition Therapy   Diet Heart Healthy Diet    Drug/Food Interactions Statins/Certain Fruits      Personal Nutrition Goals   Nutrition Goal Patient to improve diet quality by using the plate method as a guide for meal planning to include lean protein/plant protein, fruits, vegetables, whole grains, and low fat dairy as part of a well balanced diet    Personal Goal #2 Patient to limit sodium to 1500mg  per day    Comments Reviewed goals; Gwynn has had some elevated entry blood pressures at pulmonary rehab and reports elevated blood pressures at home. He reports compliance with BP meds. We discussed reducing sodium in cooking, low sodium foods, etc. Patient verbalized understanding. Gwynn will continue to benefit from participation in pulmonary rehab for nutrition and exercise support.      Intervention Plan   Intervention Prescribe, educate and counsel regarding individualized specific dietary modifications aiming towards targeted core components such as weight, hypertension, lipid management, diabetes, heart failure and other  comorbidities.;Nutrition handout(s) given to patient.    Expected Outcomes Short Term Goal: Understand basic principles of dietary content, such as calories, fat, sodium, cholesterol and nutrients.;Long Term Goal: Adherence to prescribed nutrition plan.             Nutrition Discharge:  Nutrition Assessments - 07/23/22 1120  Rate Your Plate Scores   Post Score 46             Education Questionnaire Score:  Knowledge Questionnaire Score - 07/23/22 1620       Knowledge Questionnaire Score   Post Score 15/18             Goals reviewed with patient; copy given to patient.

## 2022-08-06 DIAGNOSIS — R0602 Shortness of breath: Secondary | ICD-10-CM | POA: Diagnosis not present

## 2022-08-07 ENCOUNTER — Other Ambulatory Visit: Payer: Self-pay | Admitting: Nurse Practitioner

## 2022-08-07 DIAGNOSIS — L308 Other specified dermatitis: Secondary | ICD-10-CM

## 2022-08-15 DIAGNOSIS — I10 Essential (primary) hypertension: Secondary | ICD-10-CM | POA: Diagnosis not present

## 2022-09-06 DIAGNOSIS — R0602 Shortness of breath: Secondary | ICD-10-CM | POA: Diagnosis not present

## 2022-09-07 NOTE — Progress Notes (Signed)
Averages January 2024 data Average Systolic BP Level 143.23 mmHg Lowest Systolic BP Level 119 mmHg Highest Systolic BP Level 159 mmHg  Feb 2024 Systolic Blood Pressure 140.3 (125.0 - 161.0) Diastolic Blood Pressure 87.3 (08.6 - 103.0) Heart Rate 77.3 (70.0 - 84.0)  Mar 2024 Systolic Blood Pressure 138.0 (100.0 - 165.0) Diastolic Blood Pressure 86.5 (57.8 - 110.0) Heart Rate  73.3 (59.0 - 83.0)  April 2024 Average BP 137/85 mmHg Average HR 66 bpm  Date Systolic Diastolic Units Heart Rate Units 06/11/22 5:42 PM 137 87 mmHg 76 bpm 06/12/22 11:08 AM 125 76 mmHg 84 bpm 06/13/22 4:09 PM 129 69 mmHg 80 bpm 06/14/22 11:59 AM 145 98 mmHg 79 bpm 06/15/22 10:43 AM 137 82 mmHg 73 bpm 06/16/22 2:32 PM 136 83 mmHg 76 bpm 06/17/22 10:05 AM 138 84 mmHg 79 bpm 06/18/22 9:04 AM 138 83 mmHg 76 bpm 06/20/22 9:12 AM 144 90 mmHg 75 bpm 06/21/22 12:02 PM 146 99 mmHg 74 bpm 06/23/22 5:00 PM 134 82 mmHg 84 bpm 06/24/22 11:22 AM 136 82 mmHg 81 bpm 06/26/22 11:03 AM 134 82 mmHg 74 bpm 06/28/22 4:59 PM 148 90 mmHg 77 bpm 06/29/22 11:26 AM 134 80 mmHg 82 bpm 07/01/22 12:20 PM 161 103 mmHg 79 bpm 07/02/22 9:16 AM 141 95 mmHg 74 bpm 07/03/22 12:38 PM 140 90 mmHg 80 bpm 07/04/22 11:46 AM 142 86 mmHg 70 bpm 07/05/22 5:58 PM 152 98 mmHg 79 bpm 07/06/22 8:30 AM 150 95 mmHg 71 bpm 07/08/22 10:06 AM 145 85 mmHg 77 bpm 07/09/22 4:01 PM 165 108 mmHg 81 bpm 07/09/22 4:02 PM 165 108 mmHg 76 bpm 07/10/22 4:34 PM 109 67 mmHg 62 bpm 07/11/22 10:32 AM 100 58 mmHg 66 bpm 07/12/22 12:18 PM 120 78 mmHg 69 bpm 07/13/22 8:49 AM 121 75 mmHg 70 bpm 07/14/22 8:02 AM 128 83 mmHg 66 bpm 07/15/22 11:32 AM 152 86 mmHg 73 bpm 07/16/22 9:04 AM 130 74 mmHg 66 bpm 07/17/22 9:55 AM 142 95 mmHg 67 bpm 07/18/22 12:02 PM 135 86 mmHg 59 bpm 07/19/22 7:52 AM 138 88 mmHg 68 bpm 07/20/22 11:05 AM 145 94 mmHg 62 bpm 07/21/22 6:59 PM 121 65 mmHg 74 bpm 07/22/22 11:04 AM 152 110 mmHg 78 bpm 07/22/22 11:07 AM 151 104 mmHg 75 bpm 07/23/22 2:28 PM 126 73 mmHg 83 bpm 07/24/22 9:13  AM 123 74 mmHg 70 bpm 07/25/22 11:13 AM 131 83 mmHg 66 bpm 07/26/22 12:42 PM 136 85 mmHg 68 bpm 07/27/22 11:29 AM 131 83 mmHg 62 bpm 07/30/22 8:23 AM 137 92 mmHg 64 bpm 07/31/22 9:39 AM 141 92 mmHg 64 bpm 08/01/22 10:23 AM 145 90 mmHg 79 bpm 08/02/22 12:53 PM 144 93 mmHg 72 bpm 08/03/22 10:44 AM 146 88 mmHg 76 bpm 08/04/22 5:16 PM 138 89 mmHg 67 bpm 08/06/22 11:15 AM 137 84 mmHg 82 bpm 08/07/22 12:20 PM 149 96 mmHg 88 bpm 08/10/22 10:07 AM 131 77 mmHg 70 bpm 08/10/22 5:45 PM 117 60 mmHg 80 bpm 08/11/22 12:56 PM 140 85 mmHg 75 bpm 08/13/22 10:52 AM 141 86 mmHg 61 bpm 08/14/22 11:16 AM 149 99 mmHg 70 bpm 08/15/22 11:31 AM 146 97 mmHg 69 bpm 08/16/22 12:50 PM 141 83 mmHg 66 bpm 08/17/22 12:22 PM 135 83 mmHg 64 bpm 08/18/22 1:13 PM 134 83 mmHg 66 bpm 08/19/22 11:11 AM 140 85 mmHg 62 bpm 08/20/22 10:53 AM 132 74 mmHg 70 bpm 08/21/22 10:48 AM 134 79 mmHg 69 bpm 08/22/22 8:34 AM 125 75 mmHg 62  bpm 08/23/22 2:44 PM 145 91 mmHg 75 bpm 08/25/22 11:36 AM 131 92 mmHg 64 bpm 08/26/22 12:15 PM 135 88 mmHg 53 bpm 08/27/22 11:36 AM 139 94 mmHg 59 bpm 08/28/22 6:23 PM 128 79 mmHg 69 bpm 08/29/22 12:28 PM 153 92 mmHg 64 bpm 08/31/22 5:01 PM 136 91 mmHg 74 bpm 09/01/22 9:07 AM 141 85 mmHg 64 bpm 09/03/22 8:34 AM 147 93 mmHg 54 bpm 09/04/22 10:52 AM 131 77 mmHg 63 bpm 09/05/22 11:02 AM 135 83 mmHg 70 bpm 09/06/22 11:53 AM 140 94 mmHg 63 bpm

## 2022-09-13 NOTE — Progress Notes (Unsigned)
Synopsis: Referred for dyspnea by Carlean Jews, NP  Subjective:   PATIENT ID: Mario Proctor GENDER: male DOB: [birth year], MRN: [id number]  No chief complaint on file.  82yM with history of AF, allergy, CHF, COPD, chronic hypoxic respiratory failure on O2 at night, smoking, covid-19 04/2021  Dyspnea over last 3-4 days worse. He has no change in cough really. No productive cough, hemoptysis. He has no orthopnea. He has gained 8 lb since last visit. He hasn't noticed any LE swelling. No fever.   He is still taking trelegy 1 puff once daily rinsing mouth. Hasn't been using nebulizer. Just hasn't thought to use it.   Interval HPI LUL ggo resolved on follow up imaging 12/12  Prednisone and doxy last ov.   Continues on trelegy 1 puff once daily   He says he feels well overall, fatigue and DOE to 150 ft. About the same maybe a little worse than at last visit. No bothersome cough currently.  ----------------- Doing pulm rehab, trelegy and flutter airway clearance  PFT not performed  Otherwise pertinent review of systems is negative.    Past Medical History:  Diagnosis Date   A-fib [facility])    Allergy    CHF (congestive heart failure) (HCC)    Dupuytren contracture    right sm finger   Myocardial infarct (HCC) 2019   Myocardial infarction (HCC)    Small bowel obstruction (HCC)      Family History  Problem Relation Age of Onset   Cancer Brother        "in heart and lungs"      Past Surgical History:  Procedure Laterality Date   CARDIOVERSION N/A 07/30/2020   Procedure: CARDIOVERSION;  Surgeon: Yates Decamp, MD;  Location: [facility] ENDOSCOPY;  Service: Cardiovascular;  Laterality: N/A;   COLON SURGERY     bowel blockage   DUPUYTREN CONTRACTURE RELEASE Left 2011   FASCIECTOMY Right 05/02/2015   Procedure: FASCIECTOMY RIGHT SMALL FINGER;  Surgeon: Cindee Salt, MD;  Location: [facility];  Service: Orthopedics;  Laterality: Right;  axillary block in preop   LEFT  HEART CATH AND CORONARY ANGIOGRAPHY N/A 11/03/2016   Procedure: Left Heart Cath and Coronary Angiography;  Surgeon: Yates Decamp, MD;  Location: [facility] INVASIVE CV LAB;  Service: Cardiovascular;  Laterality: N/A;   TONSILLECTOMY     TYMPANOPLASTY Right     Social History   Socioeconomic History   Marital status: Married    Spouse name: Not on file   Number of children: 3   Years of education: Not on file   Highest education level: Not on file  Occupational History   Not on file  Tobacco Use   Smoking status: Every Day    Packs/day: 0.25    Years: 50.00    Additional pack years: 0.00    Total pack years: 12.50    Types: Cigarettes    Passive exposure: Current   Smokeless tobacco: Never   Tobacco comments:    1/4-1/2 ppd currently  Vaping Use   Vaping Use: Never used  Substance and Sexual Activity   Alcohol use: Yes    Alcohol/week: 6.0 standard drinks of alcohol    Types: 6 Cans of beer per week    Comment: social   Drug use: No   Sexual activity: Not Currently  Other Topics Concern   Not on file  Social History Narrative   ** Merged History Encounter **       Social Determinants  of Health   Financial Resource Strain: Not on file  Food Insecurity: Not on file  Transportation Needs: Not on file  Physical Activity: Not on file  Stress: Not on file  Social Connections: Not on file  Intimate Partner Violence: Not on file     No Known Allergies   Outpatient Medications Prior to Visit  Medication Sig Dispense Refill   albuterol (ACCUNEB) 1.25 MG/3ML nebulizer solution Take 3 mLs (1.25 mg total) by nebulization every 4 (four) hours as needed for wheezing or shortness of breath. 75 mL 3   albuterol (VENTOLIN HFA) 108 (90 Base) MCG/ACT inhaler TAKE 2 PUFFS BY MOUTH EVERY 6 HOURS AS NEEDED FOR WHEEZE OR SHORTNESS OF BREATH 18 each 5   amiodarone (PACERONE) 100 MG tablet Take 100 mg by mouth daily. (Patient not taking: Reported on 07/13/2022)     clobetasol cream (TEMOVATE)  0.05 % Apply 1 Application topically 2 (two) times daily as needed. 80 g 3   doxycycline (VIBRA-TABS) 100 MG tablet Take 1 tablet (100 mg total) by mouth 2 (two) times daily. 14 tablet 0   Fluticasone-Umeclidin-Vilant (TRELEGY ELLIPTA) 100-62.5-25 MCG/ACT AEPB Inhale 1 puff into the lungs daily. 2 each 0   hydrOXYzine (VISTARIL) 25 MG capsule TAKE 1 CAPSULE BY MOUTH AT BEDTIME AS NEEDED FOR ITCHING 90 capsule 1   lisinopril (ZESTRIL) 10 MG tablet TAKE 1 TABLET BY MOUTH EVERYDAY AT BEDTIME 90 tablet 1   metoprolol succinate (TOPROL-XL) 25 MG 24 hr tablet TAKE 1 TABLET (25 MG TOTAL) BY MOUTH DAILY. 90 tablet 3   rosuvastatin (CRESTOR) 5 MG tablet TAKE 1 TABLET BY MOUTH EVERY DAY 90 tablet 3   Spacer/Aero-Holding Chambers DEVI Use with inhaler 1 each 2   TRELEGY ELLIPTA 100-62.5-25 MCG/ACT AEPB INHALE 1 PUFF BY MOUTH EVERY DAY 60 each 5   triamcinolone cream (KENALOG) 0.1 % Apply 1 application topically 2 (two) times daily.     XARELTO 20 MG TABS tablet TAKE 1 TABLET BY MOUTH EVERY DAY WITH SUPPER 90 tablet 3   Facility-Administered Medications Prior to Visit  Medication Dose Route Frequency Provider Last Rate Last Admin   ipratropium-albuterol (DUONEB) 0.5-2.5 (3) MG/3ML nebulizer solution 3 mL  3 mL Nebulization Once Glenford Bayley, NP           Objective:   Physical Exam:  General appearance: 82 y.o., male, NAD, conversant  Eyes: anicteric sclerae; PERRL, tracking appropriately HENT: NCAT; MMM Neck: Trachea midline; no lymphadenopathy, no JVD Lungs: Rhonchi, faint wheeze bl, with normal respiratory effort CV: RRR, no murmur  Abdomen: Soft, non-tender; non-distended, BS present  Extremities: No peripheral edema, warm Skin: Normal turgor and texture; no rash Psych: Appropriate affect Neuro: Alert and oriented to person and place, no focal deficit     There were no vitals filed for this visit.     on RA BMI Readings from Last 3 Encounters:  07/28/22 24.60 kg/m  07/16/22  24.04 kg/m  07/13/22 24.24 kg/m   Wt Readings from Last 3 Encounters:  07/28/22 176 lb 5.9 oz (80 kg)  07/16/22 172 lb 6.4 oz (78.2 kg)  07/13/22 173 lb 12.8 oz (78.8 kg)     CBC    Component Value Date/Time   WBC 7.6 04/24/2022 1002   RBC 4.69 04/24/2022 1002   HGB 14.4 04/24/2022 1002   HGB 12.7 (L) 05/14/2021 1019   HCT 43.8 04/24/2022 1002   HCT 38.3 05/14/2021 1019   PLT 303.0 04/24/2022 1002   PLT 223  05/14/2021 1019   MCV 93.3 04/24/2022 1002   MCV 91 05/14/2021 1019   MCH 30.3 05/14/2021 1019   MCH 31.4 05/06/2021 0607   MCHC 33.0 04/24/2022 1002   RDW 13.7 04/24/2022 1002   RDW 11.5 (L) 05/14/2021 1019   LYMPHSABS 1.8 04/24/2022 1002   LYMPHSABS 2.0 09/01/2018 0803   MONOABS 0.7 04/24/2022 1002   EOSABS 0.4 04/24/2022 1002   EOSABS 0.6 (H) 09/01/2018 0803   BASOSABS 0.1 04/24/2022 1002   BASOSABS 0.1 09/01/2018 0803    Eos 200-600 historically, most recently 04/24/22 400  Chest Imaging: CT Chest 01/22/22 reviewed by me with 2cm ggo LUL new since 10/15/21 CT Chest  CT Chest 04/21/22 LUL ggo resolved   Pulmonary Functions Testing Results:    Latest Ref Rng & Units 01/07/2017   12:37 PM  PFT Results  FVC-Pre L 3.63   FVC-Predicted Pre % 82   FVC-Post L 3.96   FVC-Predicted Post % 89   Pre FEV1/FVC % % 48   Post FEV1/FCV % % 47   FEV1-Pre L 1.74   FEV1-Predicted Pre % 54   FEV1-Post L 1.86   DLCO uncorrected ml/min/mmHg 15.81   DLCO UNC% % 46   DLCO corrected ml/min/mmHg 17.71   DLCO COR %Predicted % 52   DLVA Predicted % 55   TLC L 8.24   TLC % Predicted % 113   RV % Predicted % 152      Echocardiogram:   TTE 02/26/22: Left ventricle cavity is normal in size. Mild concentric hypertrophy of  the left ventricle. Severe global hypokinesis. LVEF 30-35%. Doppler  evidence of grade I (impaired) diastolic dysfunction, normal LAP.  Calculated EF 37%.  Structurally normal mitral valve.  Mild to moderate mitral regurgitation.  No significant  change compared to 10/2020.   TTE 04/2021:  1. Left ventricular ejection fraction, by estimation, is 45 to 50%. The  left ventricle has mildly decreased function. The left ventricle  demonstrates global hypokinesis. There is mild left ventricular  hypertrophy. Left ventricular diastolic parameters  were normal.   2. Right ventricular systolic function is normal. The right ventricular  size is normal.   3. The mitral valve is normal in structure. Trivial mitral valve  regurgitation. No evidence of mitral stenosis.   4. The aortic valve is normal in structure. Aortic valve regurgitation is  not visualized. No aortic stenosis is present.   5. Compared to previous outpatient study in 10/2020, LVEF is marginally  improved from 35-40%.      Assessment & Plan:   # DOE # ACOS  # Eosinophilic asthma   Plan: - consider dupixent next visit if unimproved  - trelegy 1 puff once daily, rinse mouth after - schedule breathing tests - try out pulmonary rehab afterward - flutter valve 10 slow but firm puffs after trelegy - see you in 3 months or sooner if need be      Omar Person, MD [facility] 09/13/2022 8:46 AM

## 2022-09-14 ENCOUNTER — Ambulatory Visit: Payer: Medicare HMO | Admitting: Student

## 2022-09-14 ENCOUNTER — Ambulatory Visit (INDEPENDENT_AMBULATORY_CARE_PROVIDER_SITE_OTHER): Payer: Medicare HMO | Admitting: Student

## 2022-09-14 ENCOUNTER — Encounter: Payer: Self-pay | Admitting: Student

## 2022-09-14 VITALS — BP 140/70 | HR 78 | Temp 97.5°F | Ht 70.0 in | Wt 178.0 lb

## 2022-09-14 DIAGNOSIS — J449 Chronic obstructive pulmonary disease, unspecified: Secondary | ICD-10-CM | POA: Diagnosis not present

## 2022-09-14 DIAGNOSIS — I10 Essential (primary) hypertension: Secondary | ICD-10-CM | POA: Diagnosis not present

## 2022-09-14 DIAGNOSIS — J4489 Other specified chronic obstructive pulmonary disease: Secondary | ICD-10-CM

## 2022-09-14 LAB — PULMONARY FUNCTION TEST
DL/VA % pred: 29 %
DL/VA: 1.17 ml/min/mmHg/L
DLCO cor % pred: 26 %
DLCO cor: 6.56 ml/min/mmHg
DLCO unc % pred: 26 %
DLCO unc: 6.56 ml/min/mmHg
FEF 25-75 Post: 0.5 L/sec
FEF 25-75 Pre: 0.44 L/sec
FEF2575-%Change-Post: 13 %
FEF2575-%Pred-Post: 25 %
FEF2575-%Pred-Pre: 22 %
FEV1-%Change-Post: 4 %
FEV1-%Pred-Post: 37 %
FEV1-%Pred-Pre: 35 %
FEV1-Post: 1.07 L
FEV1-Pre: 1.02 L
FEV1FVC-%Change-Post: -1 %
FEV1FVC-%Pred-Pre: 52 %
FEV6-%Change-Post: 6 %
FEV6-%Pred-Post: 72 %
FEV6-%Pred-Pre: 68 %
FEV6-Post: 2.73 L
FEV6-Pre: 2.57 L
FEV6FVC-%Change-Post: 0 %
FEV6FVC-%Pred-Post: 101 %
FEV6FVC-%Pred-Pre: 101 %
FVC-%Change-Post: 6 %
FVC-%Pred-Post: 71 %
FVC-%Pred-Pre: 67 %
FVC-Post: 2.88 L
FVC-Pre: 2.71 L
Post FEV1/FVC ratio: 37 %
Post FEV6/FVC ratio: 95 %
Pre FEV1/FVC ratio: 38 %
Pre FEV6/FVC Ratio: 95 %
RV % pred: 242 %
RV: 6.45 L
TLC % pred: 144 %
TLC: 10.18 L

## 2022-09-14 MED ORDER — BREZTRI AEROSPHERE 160-9-4.8 MCG/ACT IN AERO: 2.0000 | INHALATION_SPRAY | Freq: Two times a day (BID) | RESPIRATORY_TRACT | 0 refills | Status: DC

## 2022-09-14 NOTE — Patient Instructions (Signed)
Full PFT Performed Today  

## 2022-09-14 NOTE — Patient Instructions (Signed)
-   call or send message if you prefer breztri and we will see if we can fill it long term - use breztri 2 puffs twice daily and rinse mouth/brush tongue and teeth after each use.  - albuterol as needed - see you in 3 months or sooner if need be

## 2022-09-14 NOTE — Progress Notes (Signed)
Full PFT Performed Today  

## 2022-09-14 NOTE — Addendum Note (Signed)
Addended by: Delrae Rend on: 09/14/2022 01:16 PM   Modules accepted: Orders

## 2022-10-06 DIAGNOSIS — R0602 Shortness of breath: Secondary | ICD-10-CM | POA: Diagnosis not present

## 2022-10-14 ENCOUNTER — Other Ambulatory Visit: Payer: Self-pay | Admitting: Internal Medicine

## 2022-10-23 ENCOUNTER — Encounter: Payer: Medicare HMO | Admitting: Nurse Practitioner

## 2022-11-02 ENCOUNTER — Encounter: Payer: Medicare HMO | Admitting: Nurse Practitioner

## 2022-11-03 ENCOUNTER — Ambulatory Visit (INDEPENDENT_AMBULATORY_CARE_PROVIDER_SITE_OTHER): Payer: Medicare HMO

## 2022-11-03 VITALS — Ht 70.0 in | Wt 178.0 lb

## 2022-11-03 DIAGNOSIS — Z0000 Encounter for general adult medical examination without abnormal findings: Secondary | ICD-10-CM | POA: Diagnosis not present

## 2022-11-03 NOTE — Progress Notes (Signed)
Subjective:   Mario Proctor is a 82 y.o. male who presents for Medicare Annual/Subsequent preventive examination.  Visit Complete: Virtual  I connected with  Mario Proctor on 11/03/22 by a audio enabled telemedicine application and verified that I am speaking with the correct person using two identifiers.  Patient Location: Home  Provider Location: Home Office  I discussed the limitations of evaluation and management by telemedicine. The patient expressed understanding and agreed to proceed.  Patient Medicare AWV questionnaire was completed by the patient on 10/31/22; I have confirmed that all information answered by patient is correct and no changes since this date.  Review of Systems     Cardiac Risk Factors include: advanced age (>31men, >27 women);male gender;smoking/ tobacco exposure     Objective:    Today's Vitals   11/03/22 1035  Weight: 178 lb (80.7 kg)  Height: 5\' 10"  (1.778 m)   Body mass index is 25.54 kg/m.     11/03/2022   10:48 AM 05/27/2022   10:37 AM 05/02/2021    7:02 PM 08/30/2020    3:42 AM 08/29/2020   10:44 PM 07/30/2020   11:02 AM 11/03/2016    7:46 AM  Advanced Directives  Does Patient Have a Medical Advance Directive? Yes Yes No  No Yes Yes  Type of Estate agent of [place];Living will Healthcare Power of [place];Living will     Living will  Does patient want to make changes to medical advance directive?  No - Patient declined       Copy of Healthcare Power of Attorney in Chart? No - copy requested        Would patient like information on creating a medical advance directive?   No - Patient declined No - Patient declined       Current Medications (verified) Outpatient Encounter Medications as of 11/03/2022  Medication Sig   albuterol (ACCUNEB) 1.25 MG/3ML nebulizer solution Take 3 mLs (1.25 mg total) by nebulization every 4 (four) hours as needed for wheezing or shortness of breath.   albuterol (VENTOLIN HFA) 108 (90  Base) MCG/ACT inhaler TAKE 2 PUFFS BY MOUTH EVERY 6 HOURS AS NEEDED FOR WHEEZE OR SHORTNESS OF BREATH   amiodarone (PACERONE) 100 MG tablet Take 100 mg by mouth daily.   clobetasol cream (TEMOVATE) 0.05 % Apply 1 Application topically 2 (two) times daily as needed.   hydrOXYzine (VISTARIL) 25 MG capsule TAKE 1 CAPSULE BY MOUTH AT BEDTIME AS NEEDED FOR ITCHING   lisinopril (ZESTRIL) 10 MG tablet TAKE 1 TABLET BY MOUTH EVERYDAY AT BEDTIME   metoprolol succinate (TOPROL-XL) 25 MG 24 hr tablet TAKE 1 TABLET (25 MG TOTAL) BY MOUTH DAILY.   TRELEGY ELLIPTA 100-62.5-25 MCG/ACT AEPB INHALE 1 PUFF BY MOUTH EVERY DAY   XARELTO 20 MG TABS tablet TAKE 1 TABLET BY MOUTH EVERY DAY WITH SUPPER   [DISCONTINUED] Budeson-Glycopyrrol-Formoterol (BREZTRI AEROSPHERE) 160-9-4.8 MCG/ACT AERO Inhale 2 puffs into the lungs in the morning and at bedtime.   [DISCONTINUED] Fluticasone-Umeclidin-Vilant (TRELEGY ELLIPTA) 100-62.5-25 MCG/ACT AEPB Inhale 1 puff into the lungs daily.   [DISCONTINUED] rosuvastatin (CRESTOR) 5 MG tablet TAKE 1 TABLET BY MOUTH EVERY DAY   [DISCONTINUED] Spacer/Aero-Holding Chambers DEVI Use with inhaler   [DISCONTINUED] triamcinolone cream (KENALOG) 0.1 % Apply 1 application topically 2 (two) times daily.   Facility-Administered Encounter Medications as of 11/03/2022  Medication   ipratropium-albuterol (DUONEB) 0.5-2.5 (3) MG/3ML nebulizer solution 3 mL    Allergies (verified) Patient has no known allergies.  History: Past Medical History:  Diagnosis Date   A-fib [facility])    Allergy    CHF (congestive heart failure) (HCC)    Dupuytren contracture    right sm finger   Myocardial infarct (HCC) 2019   Myocardial infarction (HCC)    Small bowel obstruction [facility])    Past Surgical History:  Procedure Laterality Date   CARDIOVERSION N/A 07/30/2020   Procedure: CARDIOVERSION;  Surgeon: Yates Decamp, MD;  Location: [facility] ENDOSCOPY;  Service: Cardiovascular;  Laterality: N/A;   COLON SURGERY      bowel blockage   DUPUYTREN CONTRACTURE RELEASE Left 2011   FASCIECTOMY Right 05/02/2015   Procedure: FASCIECTOMY RIGHT SMALL FINGER;  Surgeon: Cindee Salt, MD;  Location: [facility];  Service: Orthopedics;  Laterality: Right;  axillary block in preop   LEFT HEART CATH AND CORONARY ANGIOGRAPHY N/A 11/03/2016   Procedure: Left Heart Cath and Coronary Angiography;  Surgeon: Yates Decamp, MD;  Location: [facility] INVASIVE CV LAB;  Service: Cardiovascular;  Laterality: N/A;   TONSILLECTOMY     TYMPANOPLASTY Right    Family History  Problem Relation Age of Onset   Cancer Brother        "in heart and lungs"    Social History   Socioeconomic History   Marital status: Married    Spouse name: Not on file   Number of children: 3   Years of education: Not on file   Highest education level: Not on file  Occupational History   Not on file  Tobacco Use   Smoking status: Every Day    Packs/day: 2.00    Years: 50.00    Additional pack years: 0.00    Total pack years: 100.00    Types: Cigarettes    Passive exposure: Current   Smokeless tobacco: Never   Tobacco comments:    Sneaks a cigarette a couple times per day (puts it out) currently.  Trying to quit.  HFB hfb  Vaping Use   Vaping Use: Never used  Substance and Sexual Activity   Alcohol use: Yes    Alcohol/week: 6.0 standard drinks of alcohol    Types: 6 Cans of beer per week    Comment: social   Drug use: No   Sexual activity: Not Currently  Other Topics Concern   Not on file  Social History Narrative   ** Merged History Encounter **       Social Determinants of Health   Financial Resource Strain: Low Risk  (11/03/2022)   Overall Financial Resource Strain (CARDIA)    Difficulty of Paying Living Expenses: Not hard at all  Food Insecurity: No Food Insecurity (11/03/2022)   Hunger Vital Sign    Worried About Running Out of Food in the Last Year: Never true    Ran Out of Food in the Last Year: Never true  Transportation  Needs: No Transportation Needs (11/03/2022)   PRAPARE - Administrator, Civil Service (Medical): No    Lack of Transportation (Non-Medical): No  Physical Activity: Inactive (11/03/2022)   Exercise Vital Sign    Days of Exercise per Week: 0 days    Minutes of Exercise per Session: 0 min  Stress: No Stress Concern Present (11/03/2022)   [employer] of Occupational Health - Occupational Stress Questionnaire    Feeling of Stress : Not at all  Social Connections: Socially Integrated (11/03/2022)   Social Connection and Isolation Panel [NHANES]    Frequency of Communication with Friends and Family: More  than three times a week    Frequency of Social Gatherings with Friends and Family: More than three times a week    Attends Religious Services: More than 4 times per year    Active Member of [employer] or Organizations: Yes    Attends Engineer, structural: More than 4 times per year    Marital Status: Married    Tobacco Counseling Ready to quit: Yes Counseling given: Yes Tobacco comments: Sneaks a cigarette a couple times per day (puts it out) currently.  Trying to quit.  HFB hfb   Clinical Intake:  Pre-visit preparation completed: Yes  Pain : No/denies pain     BMI - recorded: 25.54 Nutritional Status: BMI 25 -29 Overweight Nutritional Risks: None Diabetes: No  How often do you need to have someone help you when you read instructions, pamphlets, or other written materials from your doctor or pharmacy?: 1 - Never  Interpreter Needed?: No  Information entered by :: Theresa Mulligan LPN   Activities of Daily Living    11/03/2022   10:42 AM 10/31/2022    5:37 PM  In your present state of health, do you have any difficulty performing the following activities:  Hearing? 1 1  Comment Wears hearing aids   Vision? 0 0  Difficulty concentrating or making decisions? 0 0  Walking or climbing stairs? 0 1  Dressing or bathing? 0 1  Doing errands, shopping? 0 0   Preparing Food and eating ? N N  Using the Toilet? N N  In the past six months, have you accidently leaked urine? N N  Do you have problems with loss of bowel control? N N  Managing your Medications? N N  Managing your Finances? N N  Housekeeping or managing your Housekeeping? N Y    Patient Care Team: Carlean Jews, NP as PCP - General (Family Medicine) Aida Puffer, MD (Family Medicine)  Indicate any recent Medical Services you may have received from other than [facility] providers in the past year (date may be approximate).     Assessment:   This is a routine wellness examination for Madyx.  Hearing/Vision screen Hearing Screening - Comments:: Wears hearing aids Vision Screening - Comments::  up to date with routine eye exams with  Dr Cathey Endow  Dietary issues and exercise activities discussed:     Goals Addressed               This Visit's Progress     No current goals (pt-stated)         Depression Screen    11/03/2022   10:42 AM 07/23/2022    4:21 PM 06/24/2022    9:11 AM 05/27/2022   11:50 AM  PHQ 2/9 Scores  PHQ - 2 Score 0 0 0 0  PHQ- 9 Score  0 1 1    Fall Risk    11/03/2022   10:47 AM 10/31/2022    5:37 PM 07/30/2022   12:20 PM 07/23/2022   12:04 PM 07/21/2022   11:51 AM  Fall Risk   Falls in the past year? 0 1 0 0 0  Number falls in past yr: 0 0 0 0 0  Injury with Fall? 0 1 0 0 0  Risk for fall due to : No Fall Risks  No Fall Risks No Fall Risks No Fall Risks  Follow up Falls prevention discussed  Falls evaluation completed Falls evaluation completed Falls evaluation completed    MEDICARE RISK AT HOME:  Medicare  Risk at Home - 11/03/22 1052     Any stairs in or around the home? Yes    If so, are there any without handrails? No    Home free of loose throw rugs in walkways, pet beds, electrical cords, etc? Yes    Adequate lighting in your home to reduce risk of falls? Yes    Life alert? No    Use of a cane, walker or w/c? No    Grab bars in the  bathroom? Yes    Shower chair or bench in shower? Yes    Elevated toilet seat or a handicapped toilet? Yes             TIMED UP AND GO:  Was the test performed?  No    Cognitive Function:        11/03/2022   10:48 AM  6CIT Screen  What Year? 0 points  What month? 0 points  What time? 0 points  Count back from 20 0 points  Months in reverse 0 points  Repeat phrase 0 points  Total Score 0 points    Immunizations Immunization History  Administered Date(s) Administered   Fluad Quad(high Dose 65+) 02/23/2019, 02/08/2021   Influenza, High Dose Seasonal PF 02/15/2016, 04/16/2017, 04/04/2018, 03/11/2020   Influenza-Unspecified 03/11/2022   PFIZER(Purple Top)SARS-COV-2 Vaccination 06/15/2019, 07/10/2019, 04/02/2020   Pneumococcal-Unspecified 10/16/2010   Tdap 08/29/2020    TDAP status: Up to date  Flu Vaccine status: Up to date  Pneumococcal vaccine status: Due, Education has been provided regarding the importance of this vaccine. Advised may receive this vaccine at local pharmacy or Health Dept. Aware to provide a copy of the vaccination record if obtained from local pharmacy or Health Dept. Verbalized acceptance and understanding.  Covid-19 vaccine status: Completed vaccines  Qualifies for Shingles Vaccine? Yes   Zostavax completed No   Shingrix Completed?: No.    Education has been provided regarding the importance of this vaccine. Patient has been advised to call insurance company to determine out of pocket expense if they have not yet received this vaccine. Advised may also receive vaccine at local pharmacy or Health Dept. Verbalized acceptance and understanding.  Screening Tests Health Maintenance  Topic Date Due   Pneumonia Vaccine 37+ Years old (1 of 2 - PCV) 12/07/1946   Zoster Vaccines- Shingrix (1 of 2) Never done   COVID-19 Vaccine (4 - 2023-24 season) 11/19/2022 (Originally 01/09/2022)   INFLUENZA VACCINE  12/10/2022   Lung Cancer Screening  04/21/2023    Medicare Annual Wellness (AWV)  11/03/2023   DTaP/Tdap/Td (2 - Td or Tdap) 08/30/2030   HPV VACCINES  Aged Out    Health Maintenance  Health Maintenance Due  Topic Date Due   Pneumonia Vaccine 90+ Years old (1 of 2 - PCV) 12/07/1946   Zoster Vaccines- Shingrix (1 of 2) Never done    Colorectal cancer screening: No longer required.   Lung Cancer Screening: (Low Dose CT Chest recommended if Age 34-80 years, 20 pack-year currently smoking OR have quit w/in 15years.) does qualify.   Lung Cancer Screening Referral: Deferred  Additional Screening:  Hepatitis C Screening: does not qualify; Completed   Vision Screening: Recommended annual ophthalmology exams for early detection of glaucoma and other disorders of the eye. Is the patient up to date with their annual eye exam?  Yes  Who is the provider or what is the name of the office in which the patient attends annual eye exams? Dr Cathey Endow If pt is not  established with a provider, would they like to be referred to a provider to establish care? No .   Dental Screening: Recommended annual dental exams for proper oral hygiene   Community Resource Referral / Chronic Care Management:  CRR required this visit?  No   CCM required this visit?  No     Plan:     I have personally reviewed and noted the following in the patient's chart:   Medical and social history Use of alcohol, tobacco or illicit drugs  Current medications and supplements including opioid prescriptions. Patient is not currently taking opioid prescriptions. Functional ability and status Nutritional status Physical activity Advanced directives List of other physicians Hospitalizations, surgeries, and ER visits in previous 12 months Vitals Screenings to include cognitive, depression, and falls Referrals and appointments  In addition, I have reviewed and discussed with patient certain preventive protocols, quality metrics, and best practice recommendations. A  written personalized care plan for preventive services as well as general preventive health recommendations were provided to patient.     Tillie Rung, LPN   [id number]   After Visit Summary: (MyChart) Due to this being a telephonic visit, the after visit summary with patients personalized plan was offered to patient via MyChart   Nurse Notes: None

## 2022-11-03 NOTE — Patient Instructions (Addendum)
Mr. Mario Proctor , Thank you for taking time to come for your Medicare Wellness Visit. I appreciate your ongoing commitment to your health goals. Please review the following plan we discussed and let me know if I can assist you in the future.   These are the goals we discussed:  Goals       No current goals (pt-stated)        This is a list of the screening recommended for you and due dates:  Health Maintenance  Topic Date Due   Pneumonia Vaccine (1 of 2 - PCV) 12/07/1946   Zoster (Shingles) Vaccine (1 of 2) Never done   COVID-19 Vaccine (4 - 2023-24 season) 11/19/2022*   Flu Shot  12/10/2022   Screening for Lung Cancer  04/21/2023   Medicare Annual Wellness Visit  11/03/2023   DTaP/Tdap/Td vaccine (2 - Td or Tdap) 08/30/2030   HPV Vaccine  Aged Out  *Topic was postponed. The date shown is not the original due date.    Advanced directives: Please bring a copy of your health care power of attorney and living will to the office to be added to your chart at your convenience.   Conditions/risks identified: None  Next appointment: Follow up in one year for your annual wellness visit.   Preventive Care 81 Years and Older, Male  Preventive care refers to lifestyle choices and visits with your health care provider that can promote health and wellness. What does preventive care include? A yearly physical exam. This is also called an annual well check. Dental exams once or twice a year. Routine eye exams. Ask your health care provider how often you should have your eyes checked. Personal lifestyle choices, including: Daily care of your teeth and gums. Regular physical activity. Eating a healthy diet. Avoiding tobacco and drug use. Limiting alcohol use. Practicing safe sex. Taking low doses of aspirin every day. Taking vitamin and mineral supplements as recommended by your health care provider. What happens during an annual well check? The services and screenings done by your health  care provider during your annual well check will depend on your age, overall health, lifestyle risk factors, and family history of disease. Counseling  Your health care provider may ask you questions about your: Alcohol use. Tobacco use. Drug use. Emotional well-being. Home and relationship well-being. Sexual activity. Eating habits. History of falls. Memory and ability to understand (cognition). Work and work Astronomer. Screening  You may have the following tests or measurements: Height, weight, and BMI. Blood pressure. Lipid and cholesterol levels. These may be checked every 5 years, or more frequently if you are over 68 years old. Skin check. Lung cancer screening. You may have this screening every year starting at age 81 if you have a 30-pack-year history of smoking and currently smoke or have quit within the past 15 years. Fecal occult blood test (FOBT) of the stool. You may have this test every year starting at age 81. Flexible sigmoidoscopy or colonoscopy. You may have a sigmoidoscopy every 5 years or a colonoscopy every 10 years starting at age 51. Prostate cancer screening. Recommendations will vary depending on your family history and other risks. Hepatitis C blood test. Hepatitis B blood test. Sexually transmitted disease (STD) testing. Diabetes screening. This is done by checking your blood sugar (glucose) after you have not eaten for a while (fasting). You may have this done every 1-3 years. Abdominal aortic aneurysm (AAA) screening. You may need this if you are a current or former  smoker. Osteoporosis. You may be screened starting at age 81 if you are at high risk. Talk with your health care provider about your test results, treatment options, and if necessary, the need for more tests. Vaccines  Your health care provider may recommend certain vaccines, such as: Influenza vaccine. This is recommended every year. Tetanus, diphtheria, and acellular pertussis (Tdap, Td)  vaccine. You may need a Td booster every 10 years. Zoster vaccine. You may need this after age 81. Pneumococcal 13-valent conjugate (PCV13) vaccine. One dose is recommended after age 81. Pneumococcal polysaccharide (PPSV23) vaccine. One dose is recommended after age 69. Talk to your health care provider about which screenings and vaccines you need and how often you need them. This information is not intended to replace advice given to you by your health care provider. Make sure you discuss any questions you have with your health care provider. Document Released: 05/24/2015 Document Revised: 01/15/2016 Document Reviewed: 02/26/2015 Elsevier Interactive Patient Education  2017 [place] Prevention in the Home Falls can cause injuries. They can happen to people of all ages. There are many things you can do to make your home safe and to help prevent falls. What can I do on the outside of my home? Regularly fix the edges of walkways and driveways and fix any cracks. Remove anything that might make you trip as you walk through a door, such as a raised step or threshold. Trim any bushes or trees on the path to your home. Use bright outdoor lighting. Clear any walking paths of anything that might make someone trip, such as rocks or tools. Regularly check to see if handrails are loose or broken. Make sure that both sides of any steps have handrails. Any raised decks and porches should have guardrails on the edges. Have any leaves, snow, or ice cleared regularly. Use sand or salt on walking paths during winter. Clean up any spills in your garage right away. This includes oil or grease spills. What can I do in the bathroom? Use night lights. Install grab bars by the toilet and in the tub and shower. Do not use towel bars as grab bars. Use non-skid mats or decals in the tub or shower. If you need to sit down in the shower, use a plastic, non-slip stool. Keep the floor dry. Clean up any  water that spills on the floor as soon as it happens. Remove soap buildup in the tub or shower regularly. Attach bath mats securely with double-sided non-slip rug tape. Do not have throw rugs and other things on the floor that can make you trip. What can I do in the bedroom? Use night lights. Make sure that you have a light by your bed that is easy to reach. Do not use any sheets or blankets that are too big for your bed. They should not hang down onto the floor. Have a firm chair that has side arms. You can use this for support while you get dressed. Do not have throw rugs and other things on the floor that can make you trip. What can I do in the kitchen? Clean up any spills right away. Avoid walking on wet floors. Keep items that you use a lot in easy-to-reach places. If you need to reach something above you, use a strong step stool that has a grab bar. Keep electrical cords out of the way. Do not use floor polish or wax that makes floors slippery. If you must use wax, use non-skid  floor wax. Do not have throw rugs and other things on the floor that can make you trip. What can I do with my stairs? Do not leave any items on the stairs. Make sure that there are handrails on both sides of the stairs and use them. Fix handrails that are broken or loose. Make sure that handrails are as [place] as the stairways. Check any carpeting to make sure that it is firmly attached to the stairs. Fix any carpet that is loose or worn. Avoid having throw rugs at the top or bottom of the stairs. If you do have throw rugs, attach them to the floor with carpet tape. Make sure that you have a light switch at the top of the stairs and the bottom of the stairs. If you do not have them, ask someone to add them for you. What else can I do to help prevent falls? Wear shoes that: Do not have high heels. Have rubber bottoms. Are comfortable and fit you well. Are closed at the toe. Do not wear sandals. If you use a  stepladder: Make sure that it is fully opened. Do not climb a closed stepladder. Make sure that both sides of the stepladder are locked into place. Ask someone to hold it for you, if possible. Clearly mark and make sure that you can see: Any grab bars or handrails. First and last steps. Where the edge of each step is. Use tools that help you move around (mobility aids) if they are needed. These include: Canes. Walkers. Scooters. Crutches. Turn on the lights when you go into a dark area. Replace any light bulbs as soon as they burn out. Set up your furniture so you have a clear path. Avoid moving your furniture around. If any of your floors are uneven, fix them. If there are any pets around you, be aware of where they are. Review your medicines with your doctor. Some medicines can make you feel dizzy. This can increase your chance of falling. Ask your doctor what other things that you can do to help prevent falls. This information is not intended to replace advice given to you by your health care provider. Make sure you discuss any questions you have with your health care provider. Document Released: 02/21/2009 Document Revised: 10/03/2015 Document Reviewed: 06/01/2014 Elsevier Interactive Patient Education  2017 [employer].

## 2022-12-07 ENCOUNTER — Other Ambulatory Visit: Payer: Self-pay | Admitting: Pulmonary Disease

## 2022-12-07 ENCOUNTER — Telehealth (HOSPITAL_COMMUNITY): Payer: Self-pay

## 2022-12-07 ENCOUNTER — Other Ambulatory Visit: Payer: Self-pay | Admitting: Nurse Practitioner

## 2022-12-07 DIAGNOSIS — L308 Other specified dermatitis: Secondary | ICD-10-CM

## 2022-12-07 NOTE — Telephone Encounter (Signed)
Called pt to let them know about the Pulmonary Wellness program at [facility] Pt states they are not interested at this time.

## 2022-12-15 ENCOUNTER — Telehealth: Payer: Self-pay | Admitting: Primary Care

## 2022-12-15 ENCOUNTER — Other Ambulatory Visit (HOSPITAL_COMMUNITY): Payer: Self-pay

## 2022-12-15 ENCOUNTER — Telehealth: Payer: Self-pay | Admitting: Pharmacist

## 2022-12-15 ENCOUNTER — Encounter: Payer: Self-pay | Admitting: Primary Care

## 2022-12-15 ENCOUNTER — Ambulatory Visit: Payer: Medicare HMO | Admitting: Primary Care

## 2022-12-15 VITALS — BP 134/74 | HR 60 | Temp 98.0°F | Ht 71.0 in | Wt 173.0 lb

## 2022-12-15 DIAGNOSIS — I5042 Chronic combined systolic (congestive) and diastolic (congestive) heart failure: Secondary | ICD-10-CM | POA: Diagnosis not present

## 2022-12-15 DIAGNOSIS — J441 Chronic obstructive pulmonary disease with (acute) exacerbation: Secondary | ICD-10-CM | POA: Insufficient documentation

## 2022-12-15 DIAGNOSIS — I480 Paroxysmal atrial fibrillation: Secondary | ICD-10-CM | POA: Diagnosis not present

## 2022-12-15 DIAGNOSIS — J4489 Other specified chronic obstructive pulmonary disease: Secondary | ICD-10-CM

## 2022-12-15 DIAGNOSIS — F17200 Nicotine dependence, unspecified, uncomplicated: Secondary | ICD-10-CM | POA: Diagnosis not present

## 2022-12-15 DIAGNOSIS — J45901 Unspecified asthma with (acute) exacerbation: Secondary | ICD-10-CM

## 2022-12-15 MED ORDER — PREDNISONE 10 MG PO TABS: ORAL_TABLET | ORAL | 0 refills | Status: DC

## 2022-12-15 NOTE — Assessment & Plan Note (Addendum)
-   Current smoker, not ready to quit - Strongly encourage smoking cessation; advised he taper amount he is smoking and pick quit date

## 2022-12-15 NOTE — Telephone Encounter (Signed)
Received new start paperwork for Dupixent. Please start BIV  Dose: 600mg  SQ at Day 0 then 300mg  every 14 days  Chesley Mires, PharmD, MPH, BCPS, CPP Clinical Pharmacist (Rheumatology and Pulmonology)

## 2022-12-15 NOTE — Patient Instructions (Addendum)
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 Celine Mans or Dr. Dewald)/ former [name] patient 30 min visit    Dupilumab Injection What is this medication? DUPILUMAB (doo PIL ue mab) treats some types of skin conditions, such as eczema. It may also be used to treat conditions that cause inflammation in the sinuses and esophagus. It can be used to prevent the symptoms of asthma. It works by decreasing inflammation. Do not use it to treat a sudden asthma attack. This medicine may be used for other purposes; ask your health care provider or pharmacist if you have questions. COMMON BRAND NAME(S): DUPIXENT What should I tell my care team before I take this medication? They need to know if you have any of these conditions: Asthma Eye disease Parasite (worm) infection An unusual or allergic reaction to dupilumab, other medications, foods, dyes, or preservatives Pregnant or trying to get pregnant Breastfeeding How should I use this medication? This medication is injected under the skin. You will be taught how to prepare and give it. Take it as directed on the prescription label. Keep taking it unless your care team tells you to stop. If you use a pen, be sure to take off the outer needle cover before using the dose. It is important that you put your used needles and syringes in a special sharps container. Do not put them in a trash can. If you do not have a sharps container, call your pharmacist or care team to get one. This medication  comes with INSTRUCTIONS FOR USE. Ask your pharmacist for directions on how to use this medication. Read the information carefully. Talk to your care team if you have questions. Talk to your care team about the use of this medication in children. While this medication may be prescribed for children as young as 6 months for selected conditions, precautions do apply. Overdosage: If you think you have taken too much of this medicine contact a poison control center or emergency room at once. NOTE: This medicine is only for you. Do not share this medicine with others. What if I miss a dose? It is important not to miss any doses. Talk to your care team about what to do if you miss a dose. What may interact with this medication? Live virus vaccines This list may not describe all possible interactions. Give your health care provider a list of all the medicines, herbs, non-prescription drugs, or dietary supplements you use. Also tell them if you smoke, drink alcohol, or use illegal drugs. Some items may interact with your medicine. What should I watch for while using this medication? Visit your care team for regular checks on your progress. Tell your care team if your symptoms do not start to get better or if they get worse. This medication can decrease the response to a vaccine. If you need to get vaccinated, tell your care team if you have received this medication. Talk to your care team to see if a different vaccination schedule is needed. If you take this medication  for asthma, you and your care team should develop an Asthma Action Plan that is just for you. Be sure to know what to do if you are in the yellow (asthma is getting worse) or red (medical alert) zones. This medication is not used to treat sudden breathing problems. What side effects may I notice from receiving this medication? Side effects that you should report to your care team as soon as possible: Allergic reactions--skin rash, itching,  hives, swelling of the face, lips, tongue, or throat Eye pain, redness, irritation, or discharge with blurry or decreased vision Unusual weakness or fatigue, fever, headache, skin rash, muscle or joint pain, loss of appetite, pain, tingling, or numbness in the hands or feet Side effects that usually do not require medical attention (report these to your care team if they continue or are bothersome): Cold sores Dry eyes Joint pain Pain, redness, or irritation at injection site Sore throat Trouble sleeping This list may not describe all possible side effects. Call your doctor for medical advice about side effects. You may report side effects to FDA at 1-800-FDA-1088. Where should I keep my medication? Keep out of the reach of children and pets. Store in the refrigerator or at room temperature up to 25 degrees C (77 degrees F). Do not freeze. Keep this medication in the original packaging until you are ready to take it. Protect from light. Refrigeration (preferred): Get rid of any unused medication after the expiration date. Room temperature: This medication may be stored at room temperature for up to 14 days. Get rid of any unused medication after 14 days or after it expires, whichever is first. To get rid of medications that are no longer needed or have expired: Take the medication to a medication take-back program. Check with your pharmacy or law enforcement to find a location. If you cannot return the medication, ask your pharmacist or care team how to get rid of this medication safely. NOTE: This sheet is a summary. It may not cover all possible information. If you have questions about this medicine, talk to your doctor, pharmacist, or health care provider.  2024 Elsevier/Gold Standard (2022-01-26 00:00:00)

## 2022-12-15 NOTE — Progress Notes (Addendum)
@Patient  ID: Mario Proctor, male    DOB: Oct 15, 1940, 82 y.o.   MRN: [id number]  Chief Complaint  Patient presents with   Follow-up    SOB with exertion and wheeze x 4 days.    Referring provider: Carlean Jews, NP  HPI: 82 year old male, current someday smoker.  Past medical history significant for A-fib, cardiomyopathy, heart failure, asthma-COPD overlap, emphysema, tobacco use disorder.  Patient with Dr. Daphane Shepherd, last seen in office on 09/14/2022.   12/15/2022 Patient presents today for 3 month follow-up. Patient of Dr. Charleen Kirks, followed for COPD/ eosinophilic asthma. He was given a trial of [employer] in May. He did not noticed a significant change in his breathing with Trelegy and cost is about the same. He is able to afford  He resumed Trelegy one puff daily. He has baseline shortness of breath. Last few days he has noticed increased wheezing symptoms. He has some associated post nasal drip. No weight gain, cough or leg swelling. He is still smoking, not ready to quit.   Pulmonary function testing 09/14/22 PFT>> FVC 2.88 (71%), FEV1 1.07 (37%), ratio 37, DLCO 6.56 (26%) Interpretation-severe obstructive airway disease, hyperinflation/air trapping, severely reduced diffusion capacity  No Known Allergies  Immunization History  Administered Date(s) Administered   Fluad Quad(high Dose 65+) 02/23/2019, 02/08/2021   Influenza, High Dose Seasonal PF 02/15/2016, 04/16/2017, 04/04/2018, 03/11/2020   Influenza-Unspecified 03/11/2022   PFIZER(Purple Top)SARS-COV-2 Vaccination 06/15/2019, 07/10/2019, 04/02/2020   Pneumococcal-Unspecified 10/16/2010   Tdap 08/29/2020    Past Medical History:  Diagnosis Date   A-fib [facility])    Allergy    CHF (congestive heart failure) (HCC)    Dupuytren contracture    right sm finger   Myocardial infarct (HCC) 2019   Myocardial infarction [facility])    Small bowel obstruction (HCC)    Stage 4 very severe COPD by GOLD classification (HCC)  05/14/2020    Tobacco History: Social History   Tobacco Use  Smoking Status Some Days   Current packs/day: 2.00   Average packs/day: 2.0 packs/day for 50.0 years (100.0 ttl pk-yrs)   Types: Cigarettes   Passive exposure: Current  Smokeless Tobacco Never  Tobacco Comments   Sneaks a cigarette a couple times per day (puts it out) currently.  Trying to quit.  Updated 12/15/2022. am   Ready to quit: Not Answered Counseling given: Not Answered Tobacco comments: Sneaks a cigarette a couple times per day (puts it out) currently.  Trying to quit.  Updated 12/15/2022. am   Outpatient Medications Prior to Visit  Medication Sig Dispense Refill   albuterol (ACCUNEB) 1.25 MG/3ML nebulizer solution Take 3 mLs (1.25 mg total) by nebulization every 4 (four) hours as needed for wheezing or shortness of breath. 75 mL 3   albuterol (VENTOLIN HFA) 108 (90 Base) MCG/ACT inhaler INHALE 2 PUFFS EVERY 6 HOURS AS NEEDED FOR WHEEZE OR SHORTNESS OF BREATH 18 g 5   amiodarone (PACERONE) 100 MG tablet Take 100 mg by mouth daily.     clobetasol cream (TEMOVATE) 0.05 % Apply 1 Application topically 2 (two) times daily as needed. 80 g 3   hydrOXYzine (VISTARIL) 25 MG capsule TAKE 1 CAPSULE BY MOUTH AT BEDTIME AS NEEDED FOR ITCHING 90 capsule 1   lisinopril (ZESTRIL) 10 MG tablet TAKE 1 TABLET BY MOUTH EVERYDAY AT BEDTIME 90 tablet 1   metoprolol succinate (TOPROL-XL) 25 MG 24 hr tablet TAKE 1 TABLET (25 MG TOTAL) BY MOUTH DAILY. 90 tablet 3   TRELEGY ELLIPTA 100-62.5-25  MCG/ACT AEPB INHALE 1 PUFF BY MOUTH EVERY DAY 60 each 5   XARELTO 20 MG TABS tablet TAKE 1 TABLET BY MOUTH EVERY DAY WITH SUPPER 90 tablet 3   Facility-Administered Medications Prior to Visit  Medication Dose Route Frequency Provider Last Rate Last Admin   ipratropium-albuterol (DUONEB) 0.5-2.5 (3) MG/3ML nebulizer solution 3 mL  3 mL Nebulization Once Glenford Bayley, NP        Review of Systems  Review of Systems  Constitutional: Negative.    HENT:  Positive for postnasal drip.   Respiratory:  Positive for shortness of breath and wheezing.   Cardiovascular: Negative.  Negative for leg swelling.   Physical Exam  BP 134/74 (BP Location: Right Arm, Patient Position: Sitting, Cuff Size: Normal)   Pulse 60   Temp 98 F (36.7 C) (Oral)   Ht 5\' 11"  (1.803 m)   Wt 173 lb (78.5 kg)   SpO2 96%   BMI 24.13 kg/m  Physical Exam Constitutional:      General: He is not in acute distress.    Appearance: Normal appearance. He is not ill-appearing.  HENT:     Head: Normocephalic and atraumatic.     Right Ear: Tympanic membrane normal. There is impacted cerumen.     Left Ear: Tympanic membrane normal. There is no impacted cerumen.     Mouth/Throat:     Mouth: Mucous membranes are moist.     Pharynx: Oropharynx is clear.  Cardiovascular:     Rate and Rhythm: Normal rate and regular rhythm.     Comments: No leg swelling  Pulmonary:     Effort: Pulmonary effort is normal. No respiratory distress.     Breath sounds: Wheezing present.  Musculoskeletal:        General: Normal range of motion.  Skin:    General: Skin is warm and dry.  Neurological:     General: No focal deficit present.     Mental Status: He is alert and oriented to person, place, and time. Mental status is at baseline.  Psychiatric:        Mood and Affect: Mood normal.        Behavior: Behavior normal.        Thought Content: Thought content normal.        Judgment: Judgment normal.      Lab Results:  CBC    Component Value Date/Time   WBC 7.6 04/24/2022 1002   RBC 4.69 04/24/2022 1002   HGB 14.4 04/24/2022 1002   HGB 12.7 (L) 05/14/2021 1019   HCT 43.8 04/24/2022 1002   HCT 38.3 05/14/2021 1019   PLT 303.0 04/24/2022 1002   PLT 223 05/14/2021 1019   MCV 93.3 04/24/2022 1002   MCV 91 05/14/2021 1019   MCH 30.3 05/14/2021 1019   MCH 31.4 05/06/2021 0607   MCHC 33.0 04/24/2022 1002   RDW 13.7 04/24/2022 1002   RDW 11.5 (L) 05/14/2021 1019    LYMPHSABS 1.8 04/24/2022 1002   LYMPHSABS 2.0 09/01/2018 0803   MONOABS 0.7 04/24/2022 1002   EOSABS 0.4 04/24/2022 1002   EOSABS 0.6 (H) 09/01/2018 0803   BASOSABS 0.1 04/24/2022 1002   BASOSABS 0.1 09/01/2018 0803    BMET    Component Value Date/Time   NA 136 01/23/2022 1039   NA 136 09/17/2021 1110   K 4.7 01/23/2022 1039   CL 99 01/23/2022 1039   CO2 32 01/23/2022 1039   GLUCOSE 95 01/23/2022 1039   BUN 19 01/23/2022  1039   BUN 29 (H) 09/17/2021 1110   CREATININE 1.43 01/23/2022 1039   CALCIUM 9.0 01/23/2022 1039   GFRNONAA 52 (L) 05/06/2021 0607   GFRAA 45 (L) 07/04/2020 0920    BNP    Component Value Date/Time   BNP 292.4 (H) 05/06/2021 0607    ProBNP    Component Value Date/Time   PROBNP 4,146 (H) 01/23/2022 1039   PROBNP 271.0 (H) 06/03/2021 0944    Imaging: No results found.   Assessment & Plan:   Asthma exacerbation with COPD (chronic obstructive pulmonary disease) (HCC) - Patient has severe COPD with eosinophilic asthma. He reports increased wheezing symptoms x 4 days. ACT score 11. Patient saw no noticeable difference between  [facility] and Trelegy. Resumed Trelegy one puff daily, he is able to afford medication but is in the donut hole. We are checking on preferred inhaler with our pharmacy team. Starting paperwork for Dupixent injections. RX prednisone taper 40mg  x 2 days, 30mg  x 2 days, 20mg  x2 days, 10mg  x 2 days. FU in 6 weeks with new MD/former Dr. Thora Lance patient.   Chronic combined systolic and diastolic CHF (congestive heart failure) (HCC) - Appears euvolemic on exam - No weight gain or leg swelling   Paroxysmal atrial fibrillation (HCC) - Continue Xarelto - Following with Dr. Custovic/cardiology   Tobacco use disorder - Current smoker, not ready to quit - Strongly encourage smoking cessation; advised he taper amount he is smoking and pick quit date   Glenford Bayley, NP 12/15/2022

## 2022-12-15 NOTE — Assessment & Plan Note (Addendum)
-   Patient has severe COPD with eosinophilic asthma. He reports increased wheezing symptoms x 4 days. ACT score 11. Patient saw no noticeable difference between [facility] and Trelegy. Resumed Trelegy one puff daily, he is able to afford medication but is in the donut hole. We are checking on preferred inhaler with our pharmacy team. Starting paperwork for Dupixent injections. RX prednisone taper 40mg  x 2 days, 30mg  x 2 days, 20mg  x2 days, 10mg  x 2 days. FU in 6 weeks with new MD/former Dr. Thora Lance patient.

## 2022-12-15 NOTE — Telephone Encounter (Signed)
Starting patient on Dupixent for eosinophilic asthma/severe COPD. Form has been completed, will bring by office today

## 2022-12-15 NOTE — Assessment & Plan Note (Signed)
-   Appears euvolemic on exam - No weight gain or leg swelling

## 2022-12-15 NOTE — Assessment & Plan Note (Signed)
-   Continue Xarelto - Following with Dr. Custovic/cardiology

## 2022-12-15 NOTE — Telephone Encounter (Signed)
Submitted a Prior Authorization request to [facility] for DUPIXENT via CoverMyMeds. Will update once we receive a response.  Key: [id number]

## 2022-12-15 NOTE — Telephone Encounter (Signed)
What inhalers are formulary on patients plan for COPD. He is currently on Trelegy but in the donut hole.

## 2022-12-15 NOTE — Telephone Encounter (Signed)
Test claims show each alternative may require a PA. Patient co-pays may still be higher than normal while still in the Coverage Gap/Donut Hole phase.

## 2022-12-15 NOTE — Telephone Encounter (Signed)
Received DMW paperwork. Will start BIV  Chesley Mires, PharmD, MPH, BCPS, CPP Clinical Pharmacist (Rheumatology and Pulmonology)

## 2022-12-16 MED ORDER — BREZTRI AEROSPHERE 160-9-4.8 MCG/ACT IN AERO: 2.0000 | INHALATION_SPRAY | Freq: Two times a day (BID) | RESPIRATORY_TRACT | 5 refills | Status: DC

## 2022-12-16 NOTE — Telephone Encounter (Signed)
I'd like to try him on Breztri. Will likely need PA. He has severe COPD and is not effectively able to use Trelegy. I will send in RX and he can pick up two samples.

## 2022-12-17 ENCOUNTER — Other Ambulatory Visit (HOSPITAL_COMMUNITY): Payer: Self-pay

## 2022-12-17 NOTE — Telephone Encounter (Signed)
Received notification from [facility] regarding a prior authorization for DUPIXENT. Authorization has been APPROVED from 12/15/22 to 05/11/23. Approval letter sent to scan center.  Per test claim, copay for 28 days supply is $1204.14  Patient can fill through [facility]: [phone]   Submitted Patient Assistance Application to [employer] for DUPIXENT along with provider portion, pt portion, med list, insurance card copy, PA. Will update patient when we receive a response.  Phone #: [phone] Fax #: [phone]  Chesley Mires, PharmD, MPH, BCPS, CPP Clinical Pharmacist (Rheumatology and Pulmonology)

## 2022-12-18 NOTE — Progress Notes (Signed)
Reviewed and agree with assessment/plan.   Coralyn Helling, MD [facility] Pulmonary/Critical Care 12/18/2022, 3:49 PM Pager:  [phone]

## 2022-12-21 MED ORDER — DUPIXENT 300 MG/2ML ~~LOC~~ SOAJ: SUBCUTANEOUS | 0 refills | Status: DC

## 2022-12-21 NOTE — Telephone Encounter (Signed)
Called patient to advise him to call DMW to express financial hardship. Provided him with DMW phone and copay for 28 day supply. He will call to complete financial hardship verbal verification with his wife. Advised him if application processes to completion, we should receive determination with 72 hours. He verbalized understanding   Chesley Mires, PharmD, MPH, BCPS, CPP Clinical Pharmacist (Rheumatology and Pulmonology)

## 2022-12-21 NOTE — Telephone Encounter (Signed)
Rx for Dupixent sent to [facility] electronically. Will await determination for pt assistance program  Chesley Mires, PharmD, MPH, BCPS, CPP Clinical Pharmacist (Rheumatology and Pulmonology)

## 2022-12-23 ENCOUNTER — Telehealth: Payer: Self-pay | Admitting: Pharmacist

## 2022-12-23 ENCOUNTER — Ambulatory Visit: Payer: Medicare HMO | Admitting: Pharmacist

## 2022-12-23 DIAGNOSIS — Z7189 Other specified counseling: Secondary | ICD-10-CM

## 2022-12-23 DIAGNOSIS — J4489 Other specified chronic obstructive pulmonary disease: Secondary | ICD-10-CM

## 2022-12-23 MED ORDER — DUPIXENT 300 MG/2ML ~~LOC~~ SOAJ: 300.0000 mg | SUBCUTANEOUS | 1 refills | Status: DC

## 2022-12-23 NOTE — Patient Instructions (Signed)
Your next DUPIXENT dose is due on 01/06/23, 01/20/23, and every 14 days thereafter     CONTINUE Breztri 2 puffs twice daily  Your prescription will be shipped from [facility]. Their phone number is [phone] Please call them if you do not receive your shipment as scheduled. They do not do auto-refills  You will need to be seen by your provider in 3 to 4 months to assess how DUPIXENT is working for you. Please ensure you have a follow-up appointment scheduled in November or December 2024. Call our clinic if you need to make this appointment.  How to manage an injection site reaction: Remember the 5 C's: COUNTER - leave on the counter at least 30 minutes but up to overnight to bring medication to room temperature. This may help prevent stinging COLD - place something cold (like an ice gel pack or cold water bottle) on the injection site just before cleansing with alcohol. This may help reduce pain CLARITIN - use Claritin (generic name is loratadine) for the first two weeks of treatment or the day of, the day before, and the day after injecting. This will help to minimize injection site reactions CORTISONE CREAM - apply if injection site is irritated and itching CALL ME - if injection site reaction is bigger than the size of your fist, looks infected, blisters, or if you develop hives

## 2022-12-23 NOTE — Telephone Encounter (Signed)
Patient started Dupixent in clinic today and reported some difficulty with affording Breztri. AZ&ME PAP completed today in clinic by patient. Provider portion placed with CMA today.  Mario Proctor, PharmD, MPH, BCPS, CPP Clinical Pharmacist (Rheumatology and Pulmonology)

## 2022-12-23 NOTE — Telephone Encounter (Signed)
Received a fax from  [employer] regarding an approval for DUPIXENT patient assistance from 12/22/2022 to 05/11/2023. Approval letter sent to scan center. Pt contacted and is scheduled to receive med this Friday. Transferred to [facility] to schedule New Start Visit  Phone #: [phone] Fax #: [phone]

## 2022-12-23 NOTE — Telephone Encounter (Signed)
Patient scheduled for Dupixent new start on 12/23/22. He is scheduled to receive Dupixent medication at home on Friday, 12/25/22  Chesley Mires, PharmD, MPH, BCPS, CPP Clinical Pharmacist (Rheumatology and Pulmonology)

## 2022-12-23 NOTE — Telephone Encounter (Signed)
Submitted Patient Assistance Application to AZ&ME for  BREZTRI  along with provider portion, patient portion and med list. CMA team to f/u if needed. Patient provided with AZ&ME phone number today and advised to answer if program calls him directly.  Phone #: [phone] Fax #: [phone]  Chesley Mires, PharmD, MPH, BCPS, CPP Clinical Pharmacist (Rheumatology and Pulmonology)

## 2022-12-23 NOTE — Progress Notes (Signed)
HPI Patient presents today to [facility] to see pharmacy team for Dupixent new start for asthma-COPD overlap. PMH also significant for CHF, cardiomyopathy, atrial fibrillation, atopic dermatitis  Last seen by Buelah Manis, NP on 12/15/22. He has completed prednisone taper  He is naive to injectable medications  Respiratory Medications Current regimen: Breztri 160-9-4.8 mcg (2 puffs twice daily) Tried in past: Incruse, Pulmicort Patient reports adherence challenges. Cost of Breztri si around $158.  OBJECTIVE No Known Allergies  Outpatient Encounter Medications as of 12/23/2022  Medication Sig   albuterol (ACCUNEB) 1.25 MG/3ML nebulizer solution Take 3 mLs (1.25 mg total) by nebulization every 4 (four) hours as needed for wheezing or shortness of breath.   albuterol (VENTOLIN HFA) 108 (90 Base) MCG/ACT inhaler INHALE 2 PUFFS EVERY 6 HOURS AS NEEDED FOR WHEEZE OR SHORTNESS OF BREATH   amiodarone (PACERONE) 100 MG tablet Take 100 mg by mouth daily.   Budeson-Glycopyrrol-Formoterol (BREZTRI AEROSPHERE) 160-9-4.8 MCG/ACT AERO Inhale 2 puffs into the lungs in the morning and at bedtime.   clobetasol cream (TEMOVATE) 0.05 % Apply 1 Application topically 2 (two) times daily as needed.   Dupilumab (DUPIXENT) 300 MG/2ML SOPN Inject 600mg  into the skin on Day 0 then 300mg  into the skin every 14 days thereafter   hydrOXYzine (VISTARIL) 25 MG capsule TAKE 1 CAPSULE BY MOUTH AT BEDTIME AS NEEDED FOR ITCHING   lisinopril (ZESTRIL) 10 MG tablet TAKE 1 TABLET BY MOUTH EVERYDAY AT BEDTIME   metoprolol succinate (TOPROL-XL) 25 MG 24 hr tablet TAKE 1 TABLET (25 MG TOTAL) BY MOUTH DAILY.   predniSONE (DELTASONE) 10 MG tablet 4 tabs for 2 days, then 3 tabs for 2 days, 2 tabs for 2 days, then 1 tab for 2 days, then stop   XARELTO 20 MG TABS tablet TAKE 1 TABLET BY MOUTH EVERY DAY WITH SUPPER   No facility-administered encounter medications on file as of 12/23/2022.     Immunization History   Administered Date(s) Administered   Fluad Quad(high Dose 65+) 02/23/2019, 02/08/2021   Influenza, High Dose Seasonal PF 02/15/2016, 04/16/2017, 04/04/2018, 03/11/2020   Influenza-Unspecified 03/11/2022   PFIZER(Purple Top)SARS-COV-2 Vaccination 06/15/2019, 07/10/2019, 04/02/2020   Pneumococcal-Unspecified 10/16/2010   Tdap 08/29/2020     PFTs    Latest Ref Rng & Units 09/14/2022    9:47 AM 01/07/2017   12:37 PM  PFT Results  FVC-Pre L 2.71  3.63   FVC-Predicted Pre % 67  82   FVC-Post L 2.88  3.96   FVC-Predicted Post % 71  89   Pre FEV1/FVC % % 38  48   Post FEV1/FCV % % 37  47   FEV1-Pre L 1.02  1.74   FEV1-Predicted Pre % 35  54   FEV1-Post L 1.07  1.86   DLCO uncorrected ml/min/mmHg 6.56  15.81   DLCO UNC% % 26  46   DLCO corrected ml/min/mmHg 6.56  17.71   DLCO COR %Predicted % 26  52   DLVA Predicted % 29  55   TLC L 10.18  8.24   TLC % Predicted % 144  113   RV % Predicted % 242  152     Eosinophils Most recent blood eosinophil count was 300 cells/microL taken on 12/02/22.   IgE: 239 on 04/24/2022  Assessment   Biologics training for dupilumab (Dupixent)  Goals of therapy: Mechanism: human monoclonal IgG4 antibody that inhibits interleukin-4 and interleukin-13 cytokine-induced responses, including release of proinflammatory cytokines, chemokines, and IgE Reviewed that Dupixent is add-on  medication and patient must continue maintenance inhaler regimen. Reviewed BOREAS study results showing clinical benefit of Dupixent in COPD patients (without asthma overlap) Response to therapy: may take 4 months to determine efficacy. Discussed that patients generally feel improvement sooner than 4 months.  Side effects: injection site reaction (6-18%), antibody development (5-16%), ophthalmic conjunctivitis (2-16%), transient blood eosinophilia (1-2%)  Dose: 600mg  at Week 0 (administered today in clinic) followed by 300mg  every 14 days thereafter  Administration/Storage:   Reviewed administration sites of thigh or abdomen (at least 2-3 inches away from abdomen). Reviewed the upper arm is only appropriate if caregiver is administering injection  Do not shake pen/syringe as this could lead to product foaming or precipitation. Do not use if solution is discolored or contains particulate matter or if window on prefilled pen is yellow (indicates pen has been used).  Reviewed storage of medication in refrigerator. Reviewed that Dupixent can be stored at room temperature in unopened carton for up to 14 days.  Access: Approval of Dupixent through: patient assistance. He has already scheduled shipment of medication to his home on 12/25/22  Patient self-administered Dupixent 300mg /68ml x 2 (total dose 600mg ) in right lower abdomen and left lower abdomen using sample Dupixent 300mg /15mL autoinjector pen NDC: [phone] Lot: [id number] Expiration: 09/07/2024  Patient monitored for 30 minutes for adverse reaction.  Patient tolerated well. Injection site checked and no redness or swelling noted. Patient denies itchiness or irritation at injection site  Medication Reconciliation  A drug regimen assessment was performed, including review of allergies, interactions, disease-state management, dosing and immunization history. Medications were reviewed with the patient, including name, instructions, indication, goals of therapy, potential side effects, importance of adherence, and safe use.  Drug interaction(s): none noted  PLAN Continue Dupixent 300mg  every 14 days.  Next dose is due 01/06/23 and every 14 days thereafter. Rx sent to: [facility]: [phone].  Patient provided with pharmacy phone number. He has already scheduled shipment of medication to his home on 12/25/22 Continue maintenance asthma regimen of: Breztri 160-9-4.8 mcg (2 puffs twice daily) Breztri application for AZ&Me PAP completed today by patient. Provider portion placed with Buelah Manis, NP  All  questions encouraged and answered.  Instructed patient to reach out with any further questions or concerns.  Thank you for allowing pharmacy to participate in this patient's care.  This appointment required 45 minutes of patient care (this includes precharting, chart review, review of results, face-to-face care, etc.).   Chesley Mires, PharmD, MPH, BCPS, CPP Clinical Pharmacist (Rheumatology and Pulmonology)

## 2022-12-28 ENCOUNTER — Other Ambulatory Visit: Payer: Self-pay

## 2022-12-28 MED ORDER — RIVAROXABAN 20 MG PO TABS: 20.0000 mg | ORAL_TABLET | Freq: Every day | ORAL | 3 refills | Status: DC

## 2023-01-13 ENCOUNTER — Ambulatory Visit: Payer: Medicare HMO | Admitting: Cardiology

## 2023-01-13 NOTE — Telephone Encounter (Signed)
This encounter was created in error - please disregard.

## 2023-01-22 ENCOUNTER — Telehealth: Payer: Self-pay | Admitting: Pulmonary Disease

## 2023-01-22 MED ORDER — ALBUTEROL SULFATE 1.25 MG/3ML IN NEBU: 1.0000 | INHALATION_SOLUTION | RESPIRATORY_TRACT | 11 refills | Status: DC | PRN

## 2023-01-22 NOTE — Telephone Encounter (Signed)
Refill of pt's albuterol solution has been sent to preferred pharmacy. Called and spoke with pt letting him know this had been done and he verbalized understanding. Nothing further needed.

## 2023-01-22 NOTE — Telephone Encounter (Signed)
PT said Pharm has been unable to reach [place] to get approval for Albuterol solution for neb. [facility] [place]

## 2023-01-24 ENCOUNTER — Other Ambulatory Visit: Payer: Self-pay | Admitting: Nurse Practitioner

## 2023-01-24 DIAGNOSIS — L308 Other specified dermatitis: Secondary | ICD-10-CM

## 2023-01-28 NOTE — Telephone Encounter (Signed)
Error message

## 2023-02-08 DIAGNOSIS — I5020 Unspecified systolic (congestive) heart failure: Secondary | ICD-10-CM | POA: Insufficient documentation

## 2023-02-08 NOTE — Progress Notes (Unsigned)
Cardiology Office Note:  .   Date:  02/08/2023  ID:  Mario Proctor, DOB 07/02/1940, MRN [id number] PCP: No primary care provider on file.  [facility] Providers Cardiologist:  None { Click to update primary MD,subspecialty MD or APP then REFRESH:1}   History of Present Illness: .   Mario Proctor is a 82 y.o. Caucasian male patient  with nonischemic cardiomyopathy by coronary angiogram on 11/02/2016 revealing ejection fraction of 30-35%, ongoning tobacco use disorder, and A fib. He is on anticoagulation, Amiodarone, and Metoprolol with good rate control.   Patient now presents for 20-month follow-up. He has been doing well from a cardiac standpoint since the last time he was here. He is tolerating his medications without issues.  He has chronic dyspnea that has remained stable without PND or orthopnea.  No bleeding diathesis on Xarelto who is tolerating all his medications well.***  ROS  Risk Assessment/Calculations:    CHA2DS2-VASc Score = 6  {Click here to calculate score.  REFRESH note before signing. :1} This indicates a 9.7% annual risk of stroke. The patient's score is based upon: CHF History: 1 HTN History: 1 Diabetes History: 0 Stroke History: 2 Vascular Disease History: 0 Age Score: 2 Gender Score: 0  No BP recorded.  {Refresh Note OR Click here to enter BP  :1}***       Wt Readings from Last 3 Encounters:  12/15/22 173 lb (78.5 kg)  11/03/22 178 lb (80.7 kg)  09/14/22 178 lb (80.7 kg)    Lab Results  Component Value Date   CHOL 139 06/12/2020   HDL 66 06/12/2020   LDLCALC 60 06/12/2020   TRIG 61 06/12/2020   Lab Results  Component Value Date   NA 136 01/23/2022   K 4.7 01/23/2022   CO2 32 01/23/2022   GLUCOSE 95 01/23/2022   BUN 19 01/23/2022   CREATININE 1.43 01/23/2022   CALCIUM 9.0 01/23/2022   GFR 46.40 (L) 01/23/2022   EGFR 26 (L) 09/17/2021   GFRNONAA 52 (L) 05/06/2021   Lab Results  Component Value Date   WBC 7.6 04/24/2022   HGB  14.4 04/24/2022   HCT 43.8 04/24/2022   MCV 93.3 04/24/2022   PLT 303.0 04/24/2022    Physical Exam:   VS:  There were no vitals taken for this visit.   Wt Readings from Last 3 Encounters:  12/15/22 173 lb (78.5 kg)  11/03/22 178 lb (80.7 kg)  09/14/22 178 lb (80.7 kg)     Physical Exam   Studies Reviewed: Marland Kitchen        Coronary angiogram 11/03/2016: Severe left ventricular systolic dysfunction. The left ventricular ejection fraction is 30-35% by visual estimate with inferior wall akinesis.There is no mitral valve regurgitation. Normal LVEDP. Normal coronary arteries.   Direct current cardioversion 07/30/20 : Indication symptomatic A. Fibrillation. Procedure: Using 80 mg of IV Propofol and 60 IV Lidocaine (for reducing venous pain) for achieving deep sedation, synchronized direct current cardioversion performed. Patient was delivered with 150 Joules of electricity X 1 with success to NSR. Patient tolerated the procedure well. No immediate complication noted.   Echocardiogram 02/26/2022:  Left ventricle cavity is normal in size. Mild concentric hypertrophy of  the left ventricle. Severe global hypokinesis. LVEF 30-35%. Doppler  evidence of grade I (impaired) diastolic dysfunction, normal LAP.  Calculated EF 37%.  Structurally normal mitral valve.  Mild to moderate mitral regurgitation.  No significant change compared to 10/2020.   EKG:  EKG 01/28/22: Sinus rhythm  with first-degree AV block at a rate of 58 bpm.  Left axis, left complete bundle branch block.  Poor R wave progression, cannot exclude anteroseptal infarct old.  LVH. Compared to previous EKG, no significant change  ASSESSMENT AND PLAN: .      ICD-10-CM   1. Nonischemic cardiomyopathy (HCC)  I42.8     2. HFrEF (heart failure with reduced ejection fraction) (HCC)  I50.20     3. Permanent atrial fibrillation (HCC)  I48.21     4. Primary hypertension  I10       There are no diagnoses linked to this  encounter.  CHA2DS2-VASc Score = 6 [CHF History: 1, HTN History: 1, Diabetes History: 0, Stroke History: 2, Vascular Disease History: 0, Age Score: 2, Gender Score: 0].  Therefore, the patient's annual risk of stroke is 9.7 %.         {Are you ordering a CV Procedure (e.g. stress test, cath, DCCV, TEE, etc)?   Press F2        :[id number]}  Dispo: ***  Signed,  Yates Decamp, MD, [facility] 02/08/2023, 9:53 PM

## 2023-02-09 ENCOUNTER — Ambulatory Visit: Payer: Medicare HMO | Attending: Internal Medicine | Admitting: Cardiology

## 2023-02-09 ENCOUNTER — Encounter: Payer: Self-pay | Admitting: Cardiology

## 2023-02-09 VITALS — BP 150/78 | HR 54 | Resp 16 | Ht 71.0 in | Wt 176.0 lb

## 2023-02-09 DIAGNOSIS — I5020 Unspecified systolic (congestive) heart failure: Secondary | ICD-10-CM | POA: Diagnosis not present

## 2023-02-09 DIAGNOSIS — I10 Essential (primary) hypertension: Secondary | ICD-10-CM | POA: Diagnosis not present

## 2023-02-09 DIAGNOSIS — I428 Other cardiomyopathies: Secondary | ICD-10-CM

## 2023-02-09 DIAGNOSIS — I4821 Permanent atrial fibrillation: Secondary | ICD-10-CM | POA: Diagnosis not present

## 2023-02-09 MED ORDER — AMLODIPINE BESYLATE 5 MG PO TABS: 5.0000 mg | ORAL_TABLET | Freq: Every day | ORAL | 3 refills | Status: DC

## 2023-02-09 NOTE — Patient Instructions (Signed)
Medication Instructions:  Your physician has recommended you make the following change in your medication: Start amlodipine 5 mg by mouth daily   *If you need a refill on your cardiac medications before your next appointment, please call your pharmacy*   Lab Work: none If you have labs (blood work) drawn today and your tests are completely normal, you will receive your results only by: MyChart Message (if you have MyChart) OR A paper copy in the mail If you have any lab test that is abnormal or we need to change your treatment, we will call you to review the results.   Testing/Procedures: Your physician has requested that you have an echocardiogram. Echocardiography is a painless test that uses sound waves to create images of your heart. It provides your doctor with information about the size and shape of your heart and how well your heart's chambers and valves are working. This procedure takes approximately one hour. There are no restrictions for this procedure. Please do NOT wear cologne, perfume, aftershave, or lotions (deodorant is allowed). Please arrive 15 minutes prior to your appointment time.    Follow-Up: At [facility], you and your health needs are our priority.  As part of our continuing mission to provide you with exceptional heart care, we have created designated Provider Care Teams.  These Care Teams include your primary Cardiologist (physician) and Advanced Practice Providers (APPs -  Physician Assistants and Nurse Practitioners) who all work together to provide you with the care you need, when you need it.  We recommend signing up for the patient portal called "MyChart".  Sign up information is provided on this After Visit Summary.  MyChart is used to connect with patients for Virtual Visits (Telemedicine).  Patients are able to view lab/test results, encounter notes, upcoming appointments, etc.  Non-urgent messages can be sent to your provider as well.   To learn  more about what you can do with MyChart, go to [email or web address].    Your next appointment:   3 month(s)  Provider:   Dr Jacinto Halim     Other Instructions

## 2023-02-11 ENCOUNTER — Ambulatory Visit: Payer: Medicare HMO | Admitting: Internal Medicine

## 2023-02-25 ENCOUNTER — Ambulatory Visit (HOSPITAL_COMMUNITY): Payer: Medicare HMO | Attending: Cardiology

## 2023-02-25 DIAGNOSIS — I428 Other cardiomyopathies: Secondary | ICD-10-CM | POA: Diagnosis present

## 2023-02-25 DIAGNOSIS — I4821 Permanent atrial fibrillation: Secondary | ICD-10-CM | POA: Insufficient documentation

## 2023-02-25 DIAGNOSIS — I5020 Unspecified systolic (congestive) heart failure: Secondary | ICD-10-CM | POA: Insufficient documentation

## 2023-02-25 LAB — ECHOCARDIOGRAM COMPLETE: S' Lateral: 3.8 cm

## 2023-02-25 NOTE — Progress Notes (Signed)
Echocardiogram 02/25/2023:  1. Left ventricular ejection fraction, by estimation, is 25 to 30%. The left ventricle has severely decreased function. The left ventricle demonstrates global hypokinesis. There is mild left ventricular hypertrophy. Diastolic filling indeterminate due to afib, but filling pressures likely elevated.  2. Right ventricular systolic function is low normal. The right ventricular size is normal. Mildly increased right ventricular wall thickness.  3. Left atrial size was severely dilated.  4. Right atrial size was severely dilated.  5. The mitral valve is abnormal. Mild mitral valve regurgitation.  6. The tricuspid valve is abnormal with mild TR. RVSP 33 mm Hg.  7. The aortic valve is tricuspid. Aortic valve regurgitation is not visualized. 8. No significant change from 02/26/22 and 10/2020.

## 2023-02-26 NOTE — Progress Notes (Signed)
Echocardiogram 02/25/2023:  1. Left ventricular ejection fraction, by estimation, is 25 to 30%. The left ventricle has severely decreased function. The left ventricle demonstrates global hypokinesis. There is mild left ventricular hypertrophy. Diastolic filling indeterminate due to afib, but filling pressures likely elevated.  2. Right ventricular systolic function is low normal. The right ventricular size is normal. Mildly increased right ventricular wall thickness.  3. Left atrial size was severely dilated.  4. Right atrial size was severely dilated.  5. The mitral valve is normal. Mild mitral valve regurgitation.  6. The tricuspid valve is normal with mild TR. RVSP 33 mm Hg.  7. The aortic valve is tricuspid. Aortic valve regurgitation is not visualized. 8. No significant change from 02/26/22 and 10/2020.

## 2023-02-28 ENCOUNTER — Other Ambulatory Visit: Payer: Self-pay | Admitting: Nurse Practitioner

## 2023-02-28 DIAGNOSIS — L308 Other specified dermatitis: Secondary | ICD-10-CM

## 2023-03-09 ENCOUNTER — Encounter: Payer: Self-pay | Admitting: Family Medicine

## 2023-03-09 ENCOUNTER — Ambulatory Visit (INDEPENDENT_AMBULATORY_CARE_PROVIDER_SITE_OTHER): Payer: Medicare HMO | Admitting: Family Medicine

## 2023-03-09 VITALS — BP 127/55 | HR 71 | Ht 71.0 in | Wt 170.1 lb

## 2023-03-09 DIAGNOSIS — I4819 Other persistent atrial fibrillation: Secondary | ICD-10-CM

## 2023-03-09 DIAGNOSIS — L209 Atopic dermatitis, unspecified: Secondary | ICD-10-CM | POA: Diagnosis not present

## 2023-03-09 DIAGNOSIS — L308 Other specified dermatitis: Secondary | ICD-10-CM | POA: Diagnosis not present

## 2023-03-09 DIAGNOSIS — I10 Essential (primary) hypertension: Secondary | ICD-10-CM | POA: Diagnosis not present

## 2023-03-09 DIAGNOSIS — K120 Recurrent oral aphthae: Secondary | ICD-10-CM | POA: Insufficient documentation

## 2023-03-09 MED ORDER — CLOBETASOL PROPIONATE 0.05 % EX CREA: 1.0000 | TOPICAL_CREAM | Freq: Two times a day (BID) | CUTANEOUS | 3 refills | Status: AC | PRN

## 2023-03-09 MED ORDER — HYDROXYZINE PAMOATE 25 MG PO CAPS: ORAL_CAPSULE | ORAL | 1 refills | Status: DC

## 2023-03-09 NOTE — Patient Instructions (Addendum)
It was nice to see you today,  We addressed the following topics today: -For the mouth ulceration, you can use over-the-counter Chloraseptic spray.  It is a numbing spray.  Use this as needed. - I have refilled your prescription for the hydroxyzine and the cream. - Other creams you can use include over-the-counter anti-itch creams like Sarna or calamine lotion.  You can also put a hydrating skin lotion like Eucerin every day to keep your skin moisturized   Have a great day,  Frederic Jericho, MD

## 2023-03-09 NOTE — Progress Notes (Signed)
Established Patient Office Visit  Subjective   Patient ID: Mario Proctor, male    DOB: May 27, 1940  Age: 82 y.o. MRN: [id number]  Chief Complaint  Patient presents with   Medical Management of Chronic Issues    HPI Mouth ulceration -patient symptoms ongoing for the past week.  He wears dentures.  Has not noticed any bleeding or purulence.  Has no history of chewing tobacco use.  Does have a history of cigarette use.  Pruritus-patient has been experiencing brain is ongoing for the past few years.  Has been using a cream and hydroxyzine.  Feels like recently the symptoms are worsening.  Itchiness is upper body only.  No rash.  Hypertension-patient is taking amlodipine, Toprol and lisinopril.  Prescribed by cardiology.  COPD-patient is a smoker.  Currently using Trelegy.  Recently prescribed Dupixent for asthma component of his obstructive pulmonary disease.  Atrial fibrillation-patient taking amiodarone and Xarelto.   The ASCVD Risk score (Arnett DK, et al., 2019) failed to calculate for the following reasons:   The 2019 ASCVD risk score is only valid for ages 65 to 67   The patient has a prior MI or stroke diagnosis  Health Maintenance Due  Topic Date Due   Pneumonia Vaccine 32+ Years old (1 of 2 - PCV) 12/07/1946   Zoster Vaccines- Shingrix (1 of 2) Never done   COVID-19 Vaccine (4 - 2023-24 season) 01/10/2023      Objective:     BP (!) 127/55   Pulse 71   Ht 5\' 11"  (1.803 m)   Wt 170 lb 1.9 oz (77.2 kg)   SpO2 98%   BMI 23.73 kg/m    Physical Exam General: Alert, oriented HEENT: Patient has small ulceration on the front upper mucosal area near the gumline.  No evidence of infection or bleeding. Skin: No rashes noted on the torso anterior or posterior.     No results found for any visits on 03/09/23.      Assessment & Plan:   Persistent atrial fibrillation [facility]) Assessment & Plan: Patient continues to take amiodarone and Xarelto.  Follows up with  cardiology regularly.   Atopic dermatitis, unspecified type Assessment & Plan: Patient still complains of pruritus.  No rash.  Patient recently started Dupixent per pulmonology for asthma. - Refill hydroxyzine and clobetasol cream.  Advised to use clobetasol sparingly - Use over-the-counter emollients and anti-itch creams.  Orders: -     Clobetasol Propionate; Apply 1 Application topically 2 (two) times daily as needed.  Dispense: 80 g; Refill: 3  Pruritic dermatitis -     hydrOXYzine Pamoate; TAKE 1 CAPSULE BY MOUTH AT BEDTIME AS NEEDED FOR ITCHING  Dispense: 90 capsule; Refill: 1  Primary hypertension Assessment & Plan: Continue lisinopril, metoprolol and amlodipine.  Blood pressure well-controlled today.   Aphthous ulcer Assessment & Plan: Small ulceration in the upper lip.  Recommended symptomatic treatment with Chloraseptic spray.      Return in about 6 months (around 09/07/2023) for Chronic medical issues, itching.    Sandre Kitty, MD

## 2023-03-09 NOTE — Assessment & Plan Note (Signed)
Patient still complains of pruritus.  No rash.  Patient recently started Dupixent per pulmonology for asthma. - Refill hydroxyzine and clobetasol cream.  Advised to use clobetasol sparingly - Use over-the-counter emollients and anti-itch creams.

## 2023-03-09 NOTE — Assessment & Plan Note (Signed)
Small ulceration in the upper lip.  Recommended symptomatic treatment with Chloraseptic spray.

## 2023-03-09 NOTE — Assessment & Plan Note (Signed)
Patient continues to take amiodarone and Xarelto.  Follows up with cardiology regularly.

## 2023-03-09 NOTE — Assessment & Plan Note (Signed)
Continue lisinopril, metoprolol and amlodipine.  Blood pressure well-controlled today.

## 2023-03-16 ENCOUNTER — Other Ambulatory Visit: Payer: Self-pay | Admitting: Cardiology

## 2023-03-22 ENCOUNTER — Telehealth: Payer: Self-pay | Admitting: Internal Medicine

## 2023-03-22 DIAGNOSIS — J4489 Other specified chronic obstructive pulmonary disease: Secondary | ICD-10-CM

## 2023-03-22 NOTE — Telephone Encounter (Signed)
Needs a refill for Dupixent for 2025

## 2023-03-30 ENCOUNTER — Other Ambulatory Visit: Payer: Self-pay | Admitting: Internal Medicine

## 2023-03-30 DIAGNOSIS — I482 Chronic atrial fibrillation, unspecified: Secondary | ICD-10-CM

## 2023-04-06 MED ORDER — DUPIXENT 300 MG/2ML ~~LOC~~ SOAJ: 300.0000 mg | SUBCUTANEOUS | 1 refills | Status: DC

## 2023-04-06 NOTE — Telephone Encounter (Signed)
Refill for Dupixent sent to [facility] today. MyChart message sent  Next OV: 04/13/2023  Chesley Mires, PharmD, MPH, BCPS, CPP Clinical Pharmacist (Rheumatology and Pulmonology)

## 2023-04-13 ENCOUNTER — Encounter: Payer: Self-pay | Admitting: Internal Medicine

## 2023-04-13 ENCOUNTER — Ambulatory Visit: Payer: Medicare HMO | Admitting: Internal Medicine

## 2023-04-13 VITALS — BP 130/70 | HR 76 | Ht 71.0 in | Wt 177.6 lb

## 2023-04-13 DIAGNOSIS — J4489 Other specified chronic obstructive pulmonary disease: Secondary | ICD-10-CM

## 2023-04-13 DIAGNOSIS — J8283 Eosinophilic asthma: Secondary | ICD-10-CM

## 2023-04-13 DIAGNOSIS — D7219 Other eosinophilia: Secondary | ICD-10-CM

## 2023-04-13 DIAGNOSIS — F17200 Nicotine dependence, unspecified, uncomplicated: Secondary | ICD-10-CM

## 2023-04-13 NOTE — Patient Instructions (Addendum)
It was a pleasure to see you today!  Please schedule follow up scheduled with myself in 4 months.  If my schedule is not open yet, we will contact you with a reminder closer to that time. Please call [phone] if you haven't heard from [place] a month before, and always call us sooner if issues or concerns arise. You can also send [place] a message through MyChart, but but aware that this is not to be used for urgent issues and it may take up to 5-7 days to receive a reply. Please be aware that you will likely be able to view your results before I have a chance to respond to them. Please give [place] 5 business days to respond to any non-urgent results.   Glad your breathing is doing ok Continue the breztri 2 puffs twice daily, gargle after use Continue albuterol inhaler as needed.   Continue the dupixent injections every two weeks.   I will send the breztri and dupixent to the patient assistance.

## 2023-04-13 NOTE — Progress Notes (Signed)
Mario Proctor    [id number]    11/06/1940  Primary Care Physician:Olson, Mabeline Caras, MD Date of Appointment: 04/13/2023 Established Patient Visit  Chief complaint:   Chief Complaint  Patient presents with   Follow-up    COPD/Asthma F/U visit     HPI: Mario Proctor is a 82 y.o. man with Eosinophilic Asthma and COPD  FEV137% of predicted on dupixent October 2024. Additional medical history of HFrEF EF 30-35% with ongoing tobacco use disorder, atrial fibrillation on anticoagulation, amiodarone and metoprolol.   Interval Updates: Former patient of Dr. Thora Lance. Here to establish care with me today. Been on dupixent since October.  Doing well on breztri 2 puffs twice daily.   Last prednisone August 2024  Lives at home with wife. Independent with ADLs. Just came back from a week at the beach with his wife.   Using albuterol 2-3 times/day.   Still smoking 3-4/day. He has done pulmonary rehab before - back in 2023. He did well with this.   I have reviewed the patient's family social and past medical history and updated as appropriate.   Past Medical History:  Diagnosis Date   A-fib [facility])    Allergy    CHF (congestive heart failure) (HCC)    Dupuytren contracture    right sm finger   Myocardial infarct (HCC) 2019   Myocardial infarction [facility])    Small bowel obstruction (HCC)    Stage 4 very severe COPD by GOLD classification (HCC) 05/14/2020    Past Surgical History:  Procedure Laterality Date   CARDIOVERSION N/A 07/30/2020   Procedure: CARDIOVERSION;  Surgeon: Yates Decamp, MD;  Location: [facility] ENDOSCOPY;  Service: Cardiovascular;  Laterality: N/A;   COLON SURGERY     bowel blockage   DUPUYTREN CONTRACTURE RELEASE Left 2011   FASCIECTOMY Right 05/02/2015   Procedure: FASCIECTOMY RIGHT SMALL FINGER;  Surgeon: Cindee Salt, MD;  Location: [facility];  Service: Orthopedics;  Laterality: Right;  axillary block in preop   LEFT HEART CATH AND CORONARY  ANGIOGRAPHY N/A 11/03/2016   Procedure: Left Heart Cath and Coronary Angiography;  Surgeon: Yates Decamp, MD;  Location: [facility] INVASIVE CV LAB;  Service: Cardiovascular;  Laterality: N/A;   TONSILLECTOMY     TYMPANOPLASTY Right     Family History  Problem Relation Age of Onset   Dementia Sister    Cancer Brother        "in heart and lungs"     Social History   Occupational History   Not on file  Tobacco Use   Smoking status: Some Days    Current packs/day: 2.00    Average packs/day: 2.0 packs/day for 50.0 years (100.0 ttl pk-yrs)    Types: Cigarettes    Passive exposure: Current   Smokeless tobacco: Never   Tobacco comments:    Sneaks a cigarette a couple times per day (puts it out) currently.  Trying to quit.  Updated 02/09/2023  Vaping Use   Vaping status: Never Used  Substance and Sexual Activity   Alcohol use: Yes    Alcohol/week: 6.0 standard drinks of alcohol    Types: 6 Cans of beer per week    Comment: social   Drug use: No   Sexual activity: Not Currently     Physical Exam: Blood pressure 130/70, pulse 76, height 5\' 11"  (1.803 m), weight 177 lb 9.6 oz (80.6 kg), SpO2 92%.  Gen:      No acute distress  ENT:  no thrush Lungs:    diminished, rare  end expiratory wheeze CV:         RRR no mrg   Data Reviewed: Imaging: I have personally reviewed the CT Chest Dec 2023 severe emphysema with saber sheath trachea  PFTs:     Latest Ref Rng & Units 09/14/2022    9:47 AM 01/07/2017   12:37 PM  PFT Results  FVC-Pre L 2.71  3.63   FVC-Predicted Pre % 67  82   FVC-Post L 2.88  3.96   FVC-Predicted Post % 71  89   Pre FEV1/FVC % % 38  48   Post FEV1/FCV % % 37  47   FEV1-Pre L 1.02  1.74   FEV1-Predicted Pre % 35  54   FEV1-Post L 1.07  1.86   DLCO uncorrected ml/min/mmHg 6.56  15.81   DLCO UNC% % 26  46   DLCO corrected ml/min/mmHg 6.56  17.71   DLCO COR %Predicted % 26  52   DLVA Predicted % 29  55   TLC L 10.18  8.24   TLC % Predicted % 144  113   RV %  Predicted % 242  152    I have personally reviewed the patient's PFTs and severe COPD FEV1 35%  Labs:  Immunization status: Immunization History  Administered Date(s) Administered   Fluad Quad(high Dose 65+) 02/23/2019, 02/08/2021   Influenza, High Dose Seasonal PF 02/15/2016, 04/16/2017, 04/04/2018, 03/11/2020, 01/19/2023   Influenza-Unspecified 03/11/2022   PFIZER(Purple Top)SARS-COV-2 Vaccination 06/15/2019, 07/10/2019, 04/02/2020   Pneumococcal-Unspecified 10/16/2010   Tdap 08/29/2020    External Records Personally Reviewed: pulmonary  Assessment:  Severe Eosinophilic Asthma with COPD Overlap FEV1 35% Someday smoking - ongoing tobacco use disorder   Plan/Recommendations:  Glad your breathing is doing ok Continue the breztri 2 puffs twice daily, gargle after use Continue albuterol inhaler as needed.   Continue the dupixent injections every two weeks.   Outside the window for lung cancer screening   Return to Care: Return in about 4 months (around 08/12/2023).   Durel Salts, MD Pulmonary and Critical Care Medicine [facility] Office:[phone]

## 2023-04-15 MED ORDER — BREZTRI AEROSPHERE 160-9-4.8 MCG/ACT IN AERO: 2.0000 | INHALATION_SPRAY | Freq: Two times a day (BID) | RESPIRATORY_TRACT | 5 refills | Status: AC

## 2023-04-15 NOTE — Progress Notes (Signed)
Refill for [facility] sent to [facility] pharmacy. Refill for Dupixent sent to [facility] pharmacy on 04/06/23  Chesley Mires, PharmD, MPH, BCPS, CPP Clinical Pharmacist (Rheumatology and Pulmonology)

## 2023-04-15 NOTE — Addendum Note (Signed)
Addended by: Murrell Redden on: 04/15/2023 01:35 PM   Modules accepted: Orders

## 2023-04-26 ENCOUNTER — Telehealth: Payer: Self-pay | Admitting: Internal Medicine

## 2023-04-27 NOTE — Telephone Encounter (Signed)
December 23, 2022 Murrell Redden, RPH-CPP     12/23/22  3:27 PM Note Submitted Patient Assistance Application to [employer] for  BREZTRI  along with provider portion, patient portion and med list. CMA team to f/u if needed. Patient provided with [employer] phone number today and advised to answer if program calls him directly.   Phone #: [phone] Fax #: [phone]   Chesley Mires, PharmD, MPH, BCPS, CPP Clinical Pharmacist (Rheumatology and Pulmonology)    Provided Contact # for [employer] for patient to request refills on Breztri.

## 2023-05-03 ENCOUNTER — Other Ambulatory Visit: Payer: Self-pay | Admitting: Pulmonary Disease

## 2023-06-15 NOTE — Progress Notes (Signed)
 Cardiology Office Note:  .   Date:  06/16/2023  ID:  [name], DOB [birth year], MRN [id number] PCP: Chandra Toribio POUR, MD  [facility] Providers Cardiologist:  Gordy Bergamo, MD   History of Present Illness: .   Mario Proctor is a 83 y.o. Caucasian male patient  with nonischemic cardiomyopathy by coronary angiogram on 11/02/2016 revealing ejection fraction of 30-35%, ongoning tobacco use disorder with severe eosinophilic asthma and COPD overlap, hypertension, chronic stage III kidney disease and permanent A fib. He is on anticoagulation, Amiodarone , and Metoprolol  with good rate control.  His CHA2DS2-VASc rescore is 6 and risk of stroke 9.7% and hence on chronic anticoagulation with Xarelto .   Patient now presents for 64-month follow-up of hypertension and atrial fibrillation rate control.  Except for chronic dyspnea he has no specific complaints.  No leg edema, no PND or orthopnea.  Continues to remain active.  Discussed the use of AI scribe software for clinical note transcription with the patient, who gave verbal consent to proceed.  History of Present Illness   Mario Proctor is an 83 year old male with heart failure, hypertension, and atrial fibrillation who presents for a cardiology follow-up.  He has a history of heart failure with reduced ejection fraction, with heart function reported to be between 25-30% as per an echocardiogram done on September 24. He has been stable for many years without hospitalizations. He is not careful with his salt intake, although he has cut back recently. He is currently taking lisinopril  10 mg daily and metoprolol  25 mg daily, which he has been on for years without issues.  He has experienced shortness of breath over the last few days, which seems to have improved today. No leg swelling is noted. He continues to smoke, albeit minimally, and acknowledges the difficulty in quitting.  Hypertension is managed with amlodipine , maintaining good  blood pressure control, with a recent reading of 131/60 mmHg.  For atrial fibrillation, he is on Xarelto  20 mg daily. No bleeding issues, dark stools, or blood in the stool.  Socially, he has a place at [facility] for 25 years, where he used to fish but now goes to relax. He has one sister with dementia and a wife who is in good health. He has not had his cholesterol checked in two years and is not currently on cholesterol medication.      Labs   Lab Results  Component Value Date   CHOL 139 06/12/2020   HDL 66 06/12/2020   LDLCALC 60 06/12/2020   TRIG 61 06/12/2020   Lab Results  Component Value Date   NA 136 01/23/2022   K 4.7 01/23/2022   CO2 32 01/23/2022   GLUCOSE 95 01/23/2022   BUN 19 01/23/2022   CREATININE 1.43 01/23/2022   CALCIUM  9.0 01/23/2022   GFR 46.40 (L) 01/23/2022   EGFR 26 (L) 09/17/2021   GFRNONAA 52 (L) 05/06/2021      Latest Ref Rng & Units 01/23/2022   10:39 AM 09/17/2021   11:10 AM 08/29/2021   10:42 AM  BMP  Glucose 70 - 99 mg/dL 95  899  73   BUN 6 - 23 mg/dL 19  29  29    Creatinine 0.40 - 1.50 mg/dL 8.56  7.56  7.96   BUN/Creat Ratio 10 - 24  12  14    Sodium 135 - 145 mEq/L 136  136  138   Potassium 3.5 - 5.1 mEq/L 4.7  6.0  5.4  Chloride 96 - 112 mEq/L 99  100  104   CO2 19 - 32 mEq/L 32  20  21   Calcium  8.4 - 10.5 mg/dL 9.0  8.9  8.7       Latest Ref Rng & Units 04/24/2022   10:02 AM 05/26/2021   10:36 AM 05/14/2021   10:19 AM  CBC  WBC 4.0 - 10.5 K/uL 7.6  5.0  8.7   Hemoglobin 13.0 - 17.0 g/dL 85.5  86.6  87.2   Hematocrit 39.0 - 52.0 % 43.8  40.4  38.3   Platelets 150.0 - 400.0 K/uL 303.0  416.0  223    Lab Results  Component Value Date   TSH 1.990 09/01/2018    External Labs:  PCP Labs 12/02/2022:  Serum glucose 70 mg, BUN 21, creatinine 1.48, EGFR 48 mL, potassium 5.3, LFTs normal.  Hb 14.2/HCT 42.3, platelets 265.  Vitamin D 31.7.  Review of Systems  Cardiovascular:  Positive for dyspnea on exertion. Negative for  chest pain and leg swelling.    Physical Exam:   VS:  BP 130/60 (BP Location: Left Arm, Patient Position: Sitting, Cuff Size: Normal)   Pulse 66   Resp 16   Ht 5' 11 (1.803 m)   Wt 181 lb 3.2 oz (82.2 kg)   SpO2 90%   BMI 25.27 kg/m    Wt Readings from Last 3 Encounters:  06/16/23 181 lb 3.2 oz (82.2 kg)  04/13/23 177 lb 9.6 oz (80.6 kg)  03/09/23 170 lb 1.9 oz (77.2 kg)     Physical Exam Neck:     Vascular: No carotid bruit or JVD.  Cardiovascular:     Rate and Rhythm: Normal rate. Rhythm irregular.     Pulses: Intact distal pulses.          Popliteal pulses are 2+ on the right side and 2+ on the left side.       Dorsalis pedis pulses are 0 on the right side and 0 on the left side.       Posterior tibial pulses are 1+ on the right side and 1+ on the left side.     Heart sounds: No murmur heard. Pulmonary:     Effort: Pulmonary effort is normal.     Breath sounds: Normal breath sounds.  Abdominal:     General: Bowel sounds are normal.     Palpations: Abdomen is soft.  Musculoskeletal:     Right lower leg: No edema.     Left lower leg: No edema.  Skin:    Capillary Refill: Capillary refill takes less than 2 seconds.    Studies Reviewed: SABRA    ECHOCARDIOGRAM COMPLETE 02/25/2023  1. Left ventricular ejection fraction, by estimation, is 25 to 30%. The left ventricle has severely decreased function. The left ventricle demonstrates global hypokinesis. There is mild left ventricular hypertrophy. Diastolic filling indeterminate due to afib, but filling pressures likely elevated. 2. Right ventricular systolic function is low normal. The right ventricular size is normal. Mildly increased right ventricular wall thickness. 3. Left atrial size was severely dilated. 4. Right atrial size was severely dilated. 5. The mitral valve is normal in structure. Mild mitral valve regurgitation. 6. Compared to 02/26/2022, no significant change, prior EF 30 to 35%.  EKG:    EKG  Interpretation Date/Time:  Wednesday June 16 2023 08:08:53 EST Ventricular Rate:  66 PR Interval:    QRS Duration:  104 QT Interval:  438 QTC Calculation: 459 R Axis:  104  Text Interpretation: EKG 06/16/2023: Atrial fibrillation with controlled ventricular response at the rate of 66 bpm, normal axis, anterolateral infarct old.  No significant change from 02/09/2023. Confirmed by Laloni Rowton, Jagadeesh ([id number]) on 06/16/2023 8:17:54 AM    Medications and allergies    No Known Allergies   Current Outpatient Medications:    albuterol  (ACCUNEB ) 1.25 MG/3ML nebulizer solution, Take 3 mLs (1.25 mg total) by nebulization every 4 (four) hours as needed for wheezing or shortness of breath., Disp: 75 mL, Rfl: 11   albuterol  (VENTOLIN  HFA) 108 (90 Base) MCG/ACT inhaler, INHALE 2 PUFFS EVERY 6 HOURS AS NEEDED FOR WHEEZE OR SHORTNESS OF BREATH, Disp: 18 each, Rfl: 11   amiodarone  (PACERONE ) 100 MG tablet, Take 100 mg by mouth daily., Disp: , Rfl:    amLODipine  (NORVASC ) 5 MG tablet, Take 1 tablet (5 mg total) by mouth daily., Disp: 90 tablet, Rfl: 3   atorvastatin  (LIPITOR) 10 MG tablet, Take 1 tablet (10 mg total) by mouth daily., Disp: 90 tablet, Rfl: 3   Budeson-Glycopyrrol-Formoterol  (BREZTRI  AEROSPHERE) 160-9-4.8 MCG/ACT AERO, Inhale 2 puffs into the lungs in the morning and at bedtime., Disp: 10.7 g, Rfl: 5   clobetasol  cream (TEMOVATE ) 0.05 %, Apply 1 Application topically 2 (two) times daily as needed., Disp: 80 g, Rfl: 3   Dupilumab  (DUPIXENT ) 300 MG/2ML SOAJ, Inject 300 mg into the skin every 14 (fourteen) days., Disp: 12 mL, Rfl: 1   hydrOXYzine  (VISTARIL ) 25 MG capsule, TAKE 1 CAPSULE BY MOUTH AT BEDTIME AS NEEDED FOR ITCHING, Disp: 90 capsule, Rfl: 1   lisinopril  (ZESTRIL ) 10 MG tablet, TAKE 1 TABLET BY MOUTH EVERYDAY AT BEDTIME, Disp: 90 tablet, Rfl: 3   metoprolol  succinate (TOPROL -XL) 25 MG 24 hr tablet, TAKE 1 TABLET (25 MG TOTAL) BY MOUTH DAILY., Disp: 90 tablet, Rfl: 3   rivaroxaban   (XARELTO ) 20 MG TABS tablet, Take 1 tablet (20 mg total) by mouth daily with supper., Disp: 90 tablet, Rfl: 3   ASSESSMENT AND PLAN: .      ICD-10-CM   1. HFrEF (heart failure with reduced ejection fraction) (HCC)  I50.20 EKG 12-Lead    CBC    Comp Met (CMET)    Pro b natriuretic peptide    2. Permanent atrial fibrillation (HCC)  I48.21 CBC    Comp Met (CMET)    3. Primary hypertension  I10 Comp Met (CMET)    4. Stage 3a chronic kidney disease (HCC)  N18.31 Comp Met (CMET)    5. Aortic atherosclerosis (HCC)  I70.0 atorvastatin  (LIPITOR) 10 MG tablet    CBC    Comp Met (CMET)    Lipid Profile      Assessment and Plan    Heart Failure with Reduced Ejection Fraction Chronic heart failure with an ejection fraction of 25-30%, well-compensated and adherent to current medication regimen. Discussed the importance of monitoring salt intake and smoking cessation. - Continue lisinopril  10 mg daily - Continue metoprolol  succinate 25 mg daily - Advise to monitor salt intake, especially when traveling - Encourage smoking cessation  Permanent Atrial Fibrillation Chronic atrial fibrillation managed with Xarelto  20 mg daily. No recent bleeding issues or symptoms of concern. Hemoglobin stable. - Continue Xarelto  20 mg daily  Hypertension Well-controlled hypertension with current blood pressure reading of 130/60 mmHg. Adherent to amlodipine . - Continue amlodipine   Hyperlipidemia Cholesterol not checked in two years. Minor plaque buildup noted in heart arteries and aorta on recent CT scan. Discussed starting statin therapy for vascular protection. - Order  lipid panel - Start statin therapy (send prescription to [facility])  General Health Maintenance Routine health maintenance discussed. Blood work due for routine monitoring. - Order CBC, CMP, lipids, and BNP  Goals of Care Discussed preferences regarding advanced directives. Prefers not to be on a breathing machine unless  temporary. - Document preferences regarding CPR and mechanical ventilation  Follow-up - Schedule follow-up appointment in one year.      Patient not on optimal medical therapy due to kidney function.  In spite of being on suboptimal medical therapy including 10 mg of lisinopril  and 25 mg of metoprolol  succinate, he is not on any other medications including SGLT2 inhibitors or Entresto  or Kerendia he has not had any decompensated heart failure or hospital admissions.  He is full code, but does state that he does not want to be hooked onto the machine if necessary.  Signed,  Gordy Bergamo, MD, [facility] 06/16/2023, 8:45 AM [facility] [address] [place], [place] [address] Phone: [phone]. Fax:  [phone]

## 2023-06-16 ENCOUNTER — Ambulatory Visit: Payer: Medicare HMO | Attending: Cardiology | Admitting: Cardiology

## 2023-06-16 ENCOUNTER — Encounter: Payer: Self-pay | Admitting: Cardiology

## 2023-06-16 VITALS — BP 130/60 | HR 66 | Resp 16 | Ht 71.0 in | Wt 181.2 lb

## 2023-06-16 DIAGNOSIS — I10 Essential (primary) hypertension: Secondary | ICD-10-CM

## 2023-06-16 DIAGNOSIS — I4821 Permanent atrial fibrillation: Secondary | ICD-10-CM | POA: Diagnosis not present

## 2023-06-16 DIAGNOSIS — I5020 Unspecified systolic (congestive) heart failure: Secondary | ICD-10-CM

## 2023-06-16 DIAGNOSIS — I700 Atherosclerosis of aorta: Secondary | ICD-10-CM

## 2023-06-16 DIAGNOSIS — N1831 Chronic kidney disease, stage 3a: Secondary | ICD-10-CM

## 2023-06-16 LAB — CBC

## 2023-06-16 MED ORDER — ATORVASTATIN CALCIUM 10 MG PO TABS: 10.0000 mg | ORAL_TABLET | Freq: Every day | ORAL | 3 refills | Status: DC

## 2023-06-16 NOTE — Patient Instructions (Signed)
 Medication Instructions:  Your physician has recommended you make the following change in your medication: Start atorvastatin  10 mg by mouth daily  *If you need a refill on your cardiac medications before your next appointment, please call your pharmacy*   Lab Work: Have lab work done at [facility] on the first floor today--CBC, CMP, Lipids, BNP If you have labs (blood work) drawn today and your tests are completely normal, you will receive your results only by: MyChart Message (if you have MyChart) OR A paper copy in the mail If you have any lab test that is abnormal or we need to change your treatment, we will call you to review the results.   Testing/Procedures: none   Follow-Up: At [facility], you and your health needs are our priority.  As part of our continuing mission to provide you with exceptional heart care, we have created designated Provider Care Teams.  These Care Teams include your primary Cardiologist (physician) and Advanced Practice Providers (APPs -  Physician Assistants and Nurse Practitioners) who all work together to provide you with the care you need, when you need it.  We recommend signing up for the patient portal called MyChart.  Sign up information is provided on this After Visit Summary.  MyChart is used to connect with patients for Virtual Visits (Telemedicine).  Patients are able to view lab/test results, encounter notes, upcoming appointments, etc.  Non-urgent messages can be sent to your provider as well.   To learn more about what you can do with MyChart, go to [email or web address].    Your next appointment:   12 month(s)  Provider:   Gordy Bergamo, MD     Other Instructions

## 2023-06-17 ENCOUNTER — Encounter: Payer: Self-pay | Admitting: Cardiology

## 2023-06-17 LAB — LIPID PANEL
Chol/HDL Ratio: 2.8 {ratio} (ref 0.0–5.0)
Cholesterol, Total: 132 mg/dL (ref 100–199)
HDL: 48 mg/dL (ref >39)
LDL Chol Calc (NIH): 70 mg/dL (ref 0–99)
Triglycerides: 65 mg/dL (ref 0–149)
VLDL Cholesterol Cal: 14 mg/dL (ref 5–40)

## 2023-06-17 LAB — COMPREHENSIVE METABOLIC PANEL
ALT: 13 [IU]/L (ref 0–44)
AST: 17 [IU]/L (ref 0–40)
Albumin: 4 g/dL (ref 3.7–4.7)
Alkaline Phosphatase: 104 [IU]/L (ref 44–121)
BUN/Creatinine Ratio: 16 (ref 10–24)
BUN: 21 mg/dL (ref 8–27)
Bilirubin Total: 0.6 mg/dL (ref 0.0–1.2)
CO2: 27 mmol/L (ref 20–29)
Calcium: 8.9 mg/dL (ref 8.6–10.2)
Chloride: 100 mmol/L (ref 96–106)
Creatinine, Ser: 1.34 mg/dL — ABNORMAL HIGH (ref 0.76–1.27)
Globulin, Total: 3 g/dL (ref 1.5–4.5)
Glucose: 89 mg/dL (ref 70–99)
Potassium: 5.1 mmol/L (ref 3.5–5.2)
Sodium: 138 mmol/L (ref 134–144)
Total Protein: 7 g/dL (ref 6.0–8.5)
eGFR: 53 mL/min/{1.73_m2} — ABNORMAL LOW (ref >59)

## 2023-06-17 LAB — PRO B NATRIURETIC PEPTIDE: NT-Pro BNP: 3308 pg/mL — ABNORMAL HIGH (ref 0–486)

## 2023-06-17 LAB — CBC
Hematocrit: 38.5 % (ref 37.5–51.0)
Hemoglobin: 12.6 g/dL — ABNORMAL LOW (ref 13.0–17.7)
MCH: 30.1 pg (ref 26.6–33.0)
MCHC: 32.7 g/dL (ref 31.5–35.7)
MCV: 92 fL (ref 79–97)
Platelets: 287 10*3/uL (ref 150–450)
RBC: 4.19 x10E6/uL (ref 4.14–5.80)
RDW: 12.1 % (ref 11.6–15.4)
WBC: 8.5 10*3/uL (ref 3.4–10.8)

## 2023-06-17 NOTE — Progress Notes (Signed)
 Dr. Chandra, patient is seeing you in April and may need to check CBC and iron levels given anticoagulation.  His BNP is high but completely asymptomatic ans suspect his baseline high. You can also repeat this on his visit.   Team, let patient know to be careful with salt, he stated that he eats salty, and does all things he wants and remains asymptomatic. Blood count slightly lower and will be rechecked in 2 months. Lipids at goal

## 2023-08-02 ENCOUNTER — Telehealth: Payer: Self-pay | Admitting: *Deleted

## 2023-08-02 NOTE — Telephone Encounter (Signed)
 Signed prescription faxed to [employer] for [facility], received confirmation fax that it was sent successfully.

## 2023-09-07 ENCOUNTER — Ambulatory Visit: Payer: Medicare HMO | Admitting: Family Medicine

## 2023-09-16 IMAGING — CT CT CHEST W/O CM
2 of 3 series · 15 of 36 positions shown, 18 images · non-contrast
Comparison: Chest radiographs dated 05/02/2021. CT chest dated
10/14/2020.

CLINICAL DATA: Respiratory illness, abnormal chest radiograph,
hypoxia

EXAM:
CT CHEST WITHOUT CONTRAST
TECHNIQUE: Multidetector CT imaging of the chest was performed following the
standard protocol without IV contrast.

[Series 3: chest w/o 2mm st · axial · non-contrast · 0.84mm/px · z∈[+1015,+1349]mm · 12 of 197 slices shown, 15 images]
[im 15/197  mediastinal]
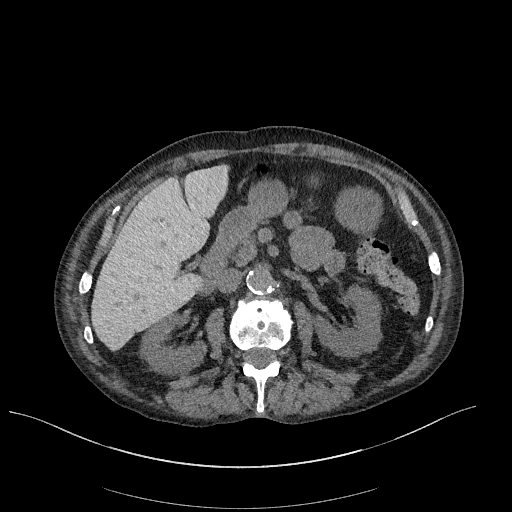
[im 15/197  lung]
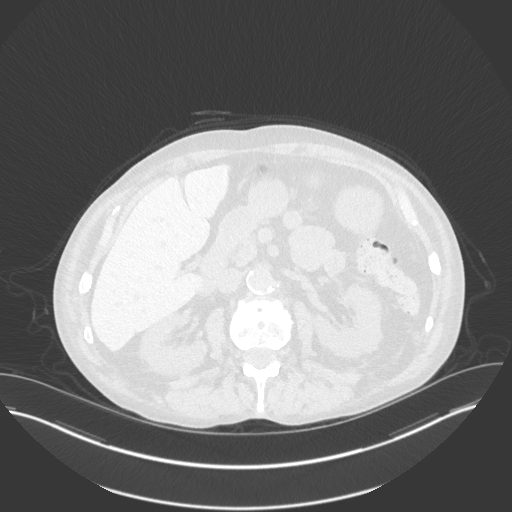
[im 30/197  lung]
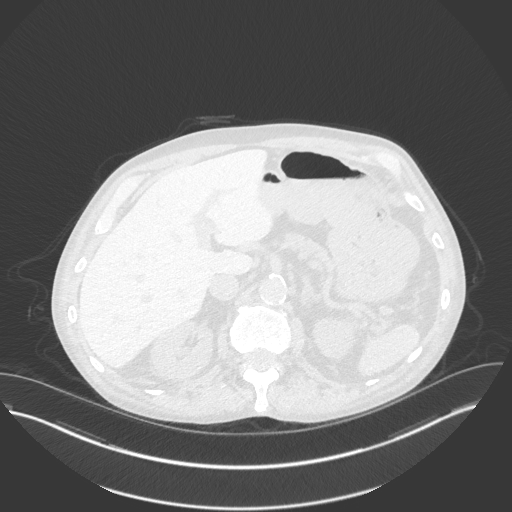
[im 44/197  lung]
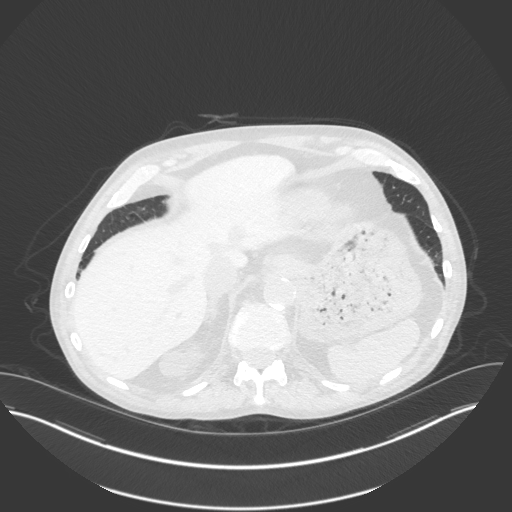
[im 59/197  lung]
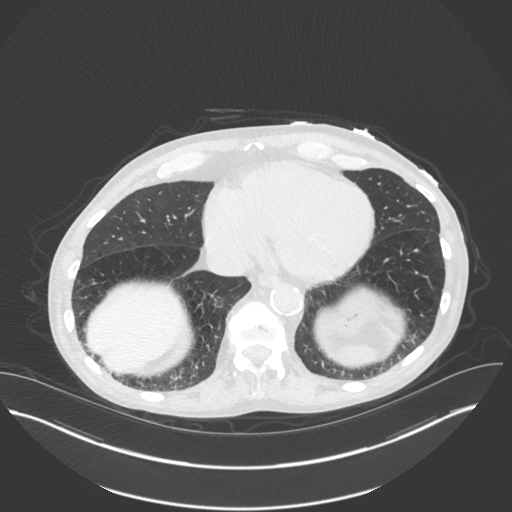
[im 73/197  mediastinal]
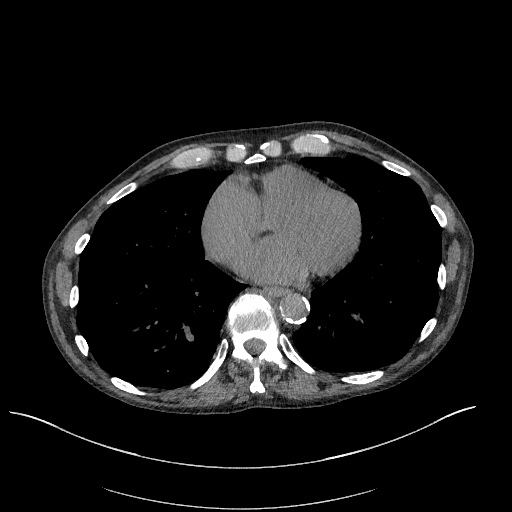
[im 73/197  lung]
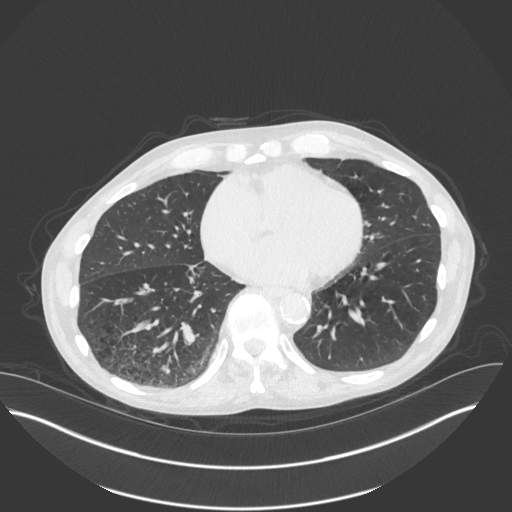
[im 88/197  lung]
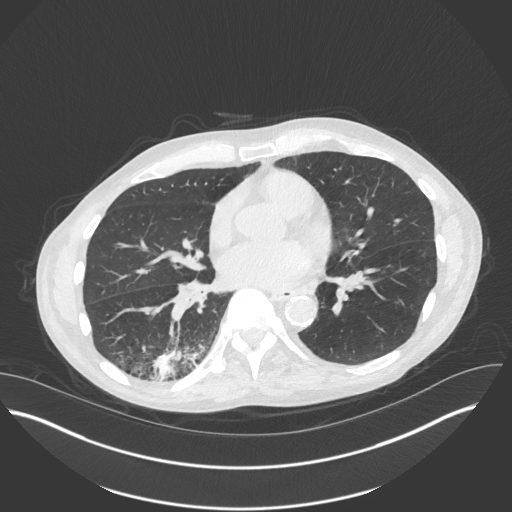
[im 109/197  lung]
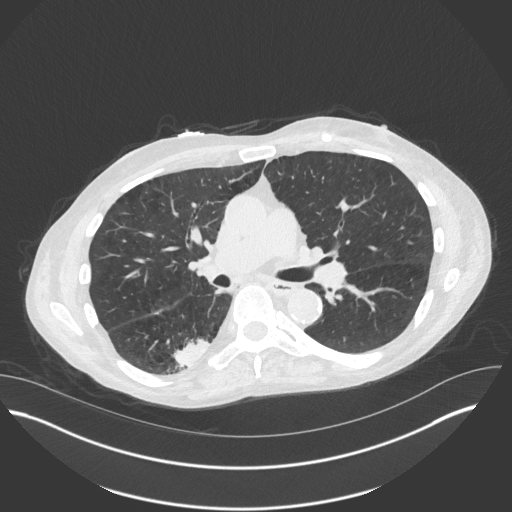
[im 124/197  lung]
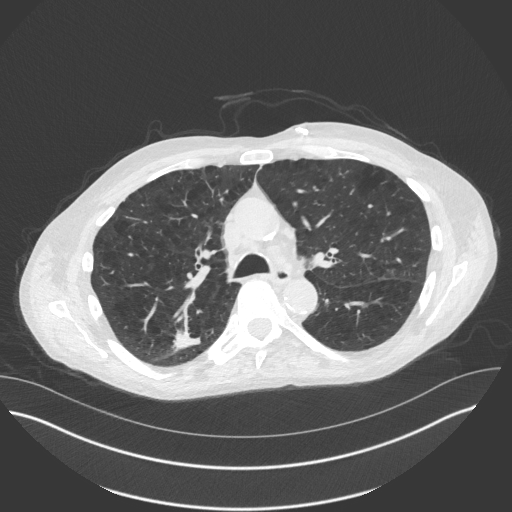
[im 138/197  mediastinal]
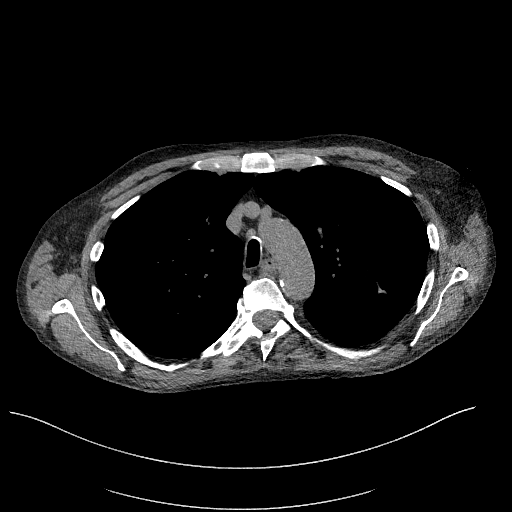
[im 138/197  lung]
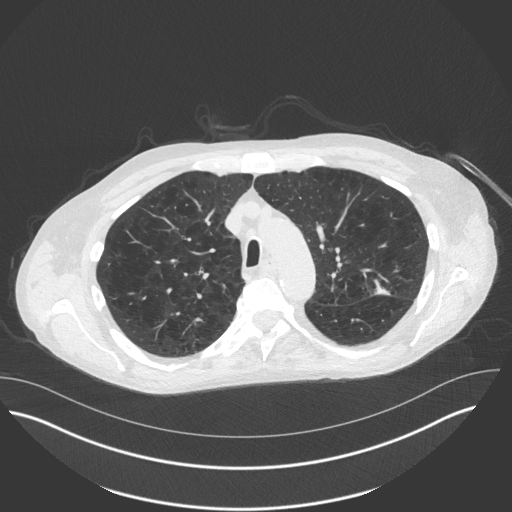
[im 153/197  lung]
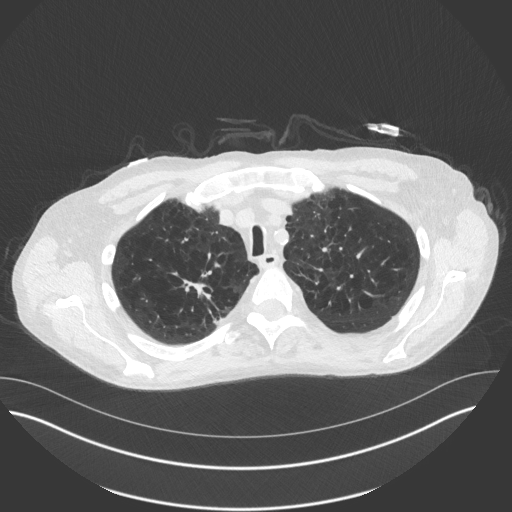
[im 167/197  lung]
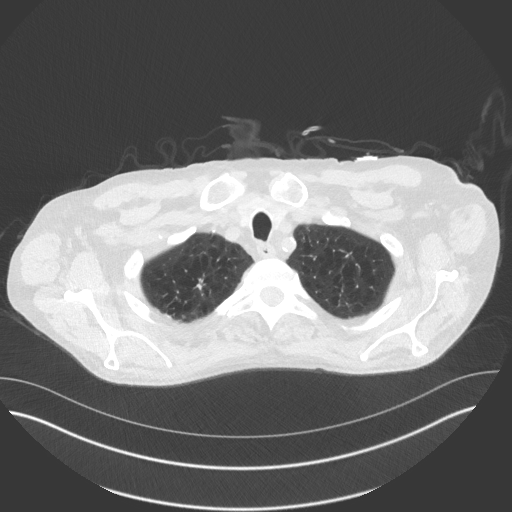
[im 182/197  lung]
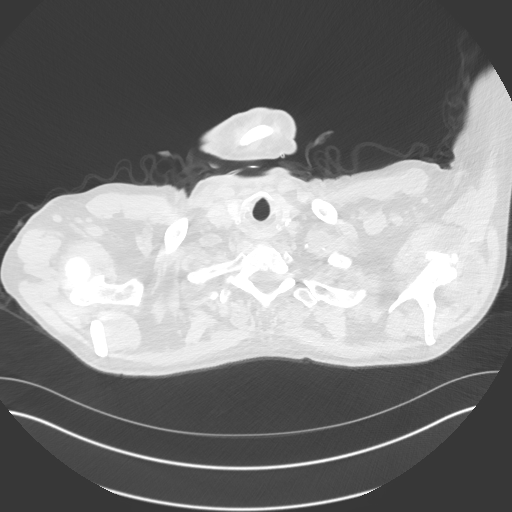

[Series 5: chest w/o 2mm st cor · coronal · non-contrast · 0.77mm/px · 3 of 150 slices shown]
[im 30/150  lung]
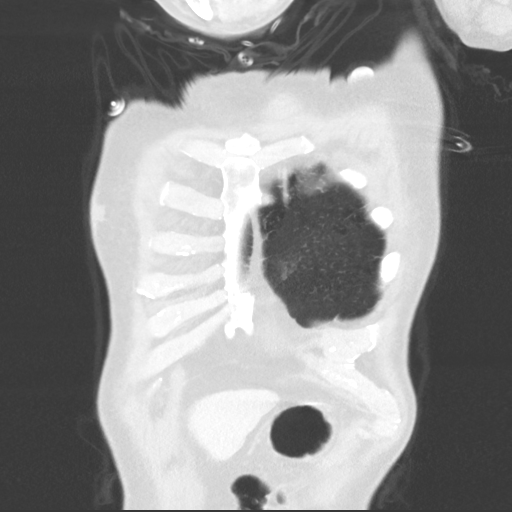
[im 60/150  lung]
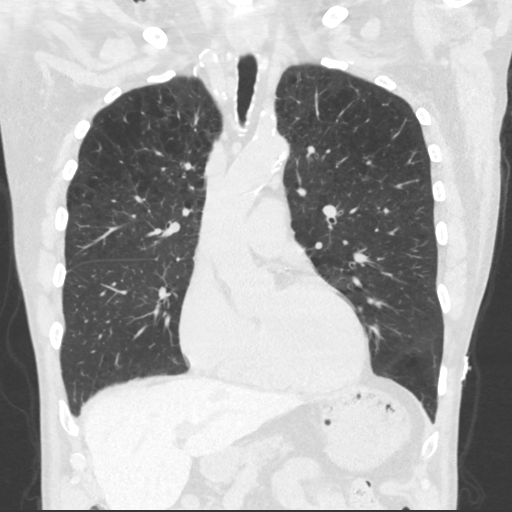
[im 90/150  lung]
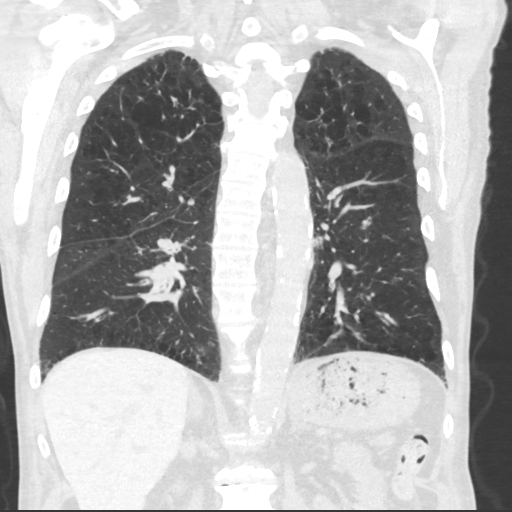

[15 of 36 positions shown; findings below may reference images not displayed]

FINDINGS: Cardiovascular: Heart is normal in size.  No pericardial effusion.

No evidence of thoracic aortic aneurysm. Atherosclerotic
calcifications of the arch.

Mild coronary atherosclerosis of the LAD and right coronary artery.

Mediastinum/Nodes: Small mediastinal lymph nodes, including a 9 mm
short axis AP window node (series 3/image 32), likely reactive.

Visualized thyroid is unremarkable.

Lungs/Pleura: 2.7 x 4.2 cm irregular/spiculated masslike opacity in
the posterior right lower lobe (series 4/image 101). Mild associated
hyperdensity within the lesion (series 3/image 102). Adjacent debris
within right lower lobe bronchi (series 4/image 101).

Additional 1.4 x 2.6 cm irregular nodular opacity in the posterior
right upper lobe (series 4/image 35) and 7 x 12 mm irregular nodular
opacity in the posterior left upper lobe (series 4/image 61).

Additional mild irregular linear/nodular opacity in the right upper
lobe (series 4/image 45).

Despite the masslike appearance of the dominant lesion, the overall
appearance, distribution, and ancillary findings favor multifocal
infection/pneumonia, possibly on the basis of aspiration. However,
this does warrant follow-up.

Moderate centrilobular and paraseptal emphysematous changes, upper
lung predominant.

No pleural effusion or pneumothorax.

Upper Abdomen: Visualized upper abdomen is notable for vascular
calcifications and a 2.4 cm right upper pole renal cyst.

Musculoskeletal: Mild superior endplate compression fracture
deformity at L1, new from the prior.
IMPRESSION: Multifocal patchy opacities including a 4.2 cm irregular/spiculated
masslike opacity in the posterior right lower lobe. Overall
appearance/distribution favors multifocal infection/pneumonia,
possibly on the basis of aspiration. However, follow-up CT chest is
suggested in 6-12 weeks (after appropriate antimicrobial therapy) to
document improvement/resolution.

Small mediastinal lymph nodes, likely reactive.

Mild superior endplate compression fracture deformity at L1, new
from the prior.

Aortic Atherosclerosis (JUQ5I-RS6.6) and Emphysema (JUQ5I-K6J.P).

## 2023-09-16 IMAGING — DX DG CHEST 2V
2 series · 2 of 2 positions shown · non-contrast
Comparison: Chest CT 10/14/2020.  Chest radiographs 05/14/2020

CLINICAL DATA: Provided history: Shortness of breath for 1 week,
new hypoxia

EXAM:
CHEST - 2 VIEW

[chest pa]
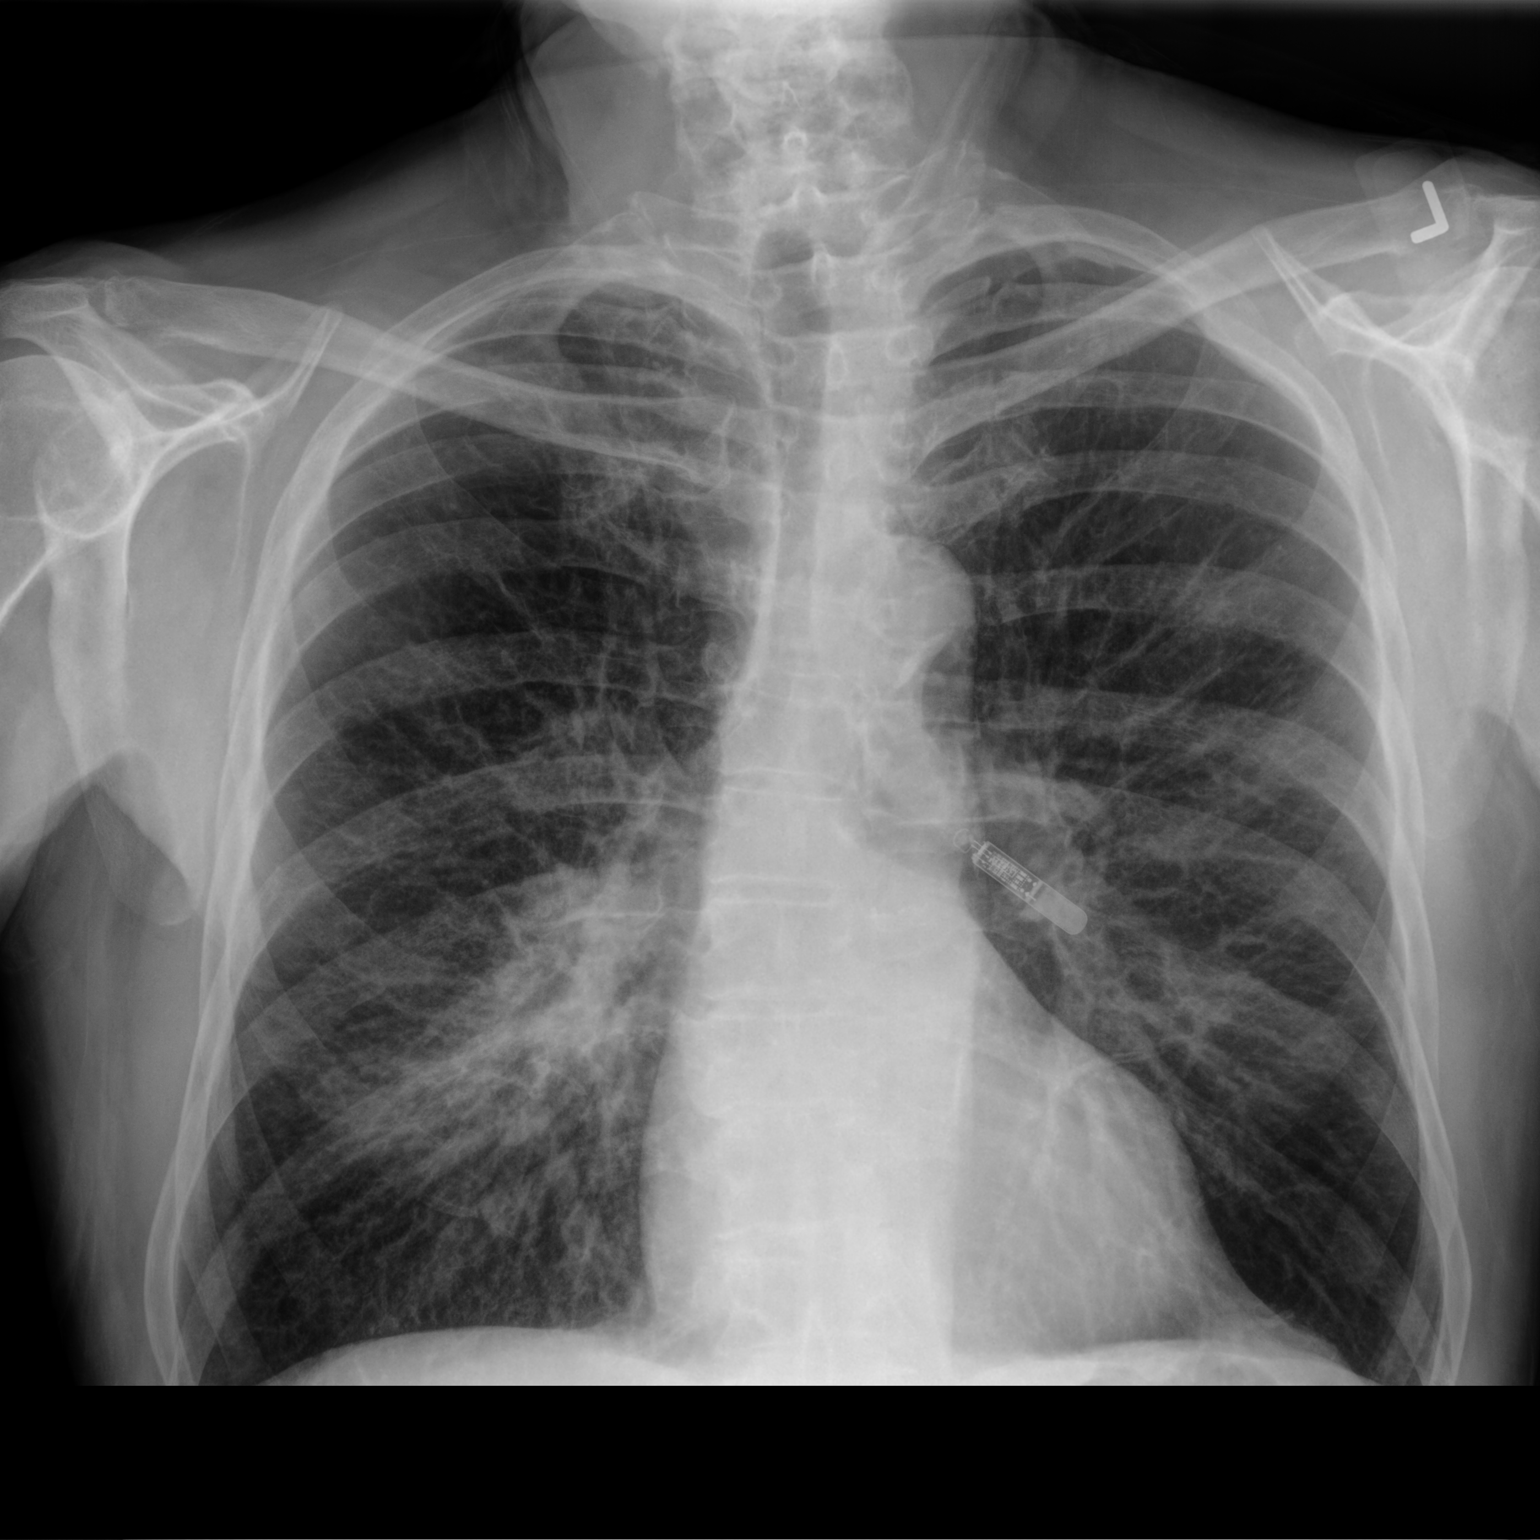

[chest lat]
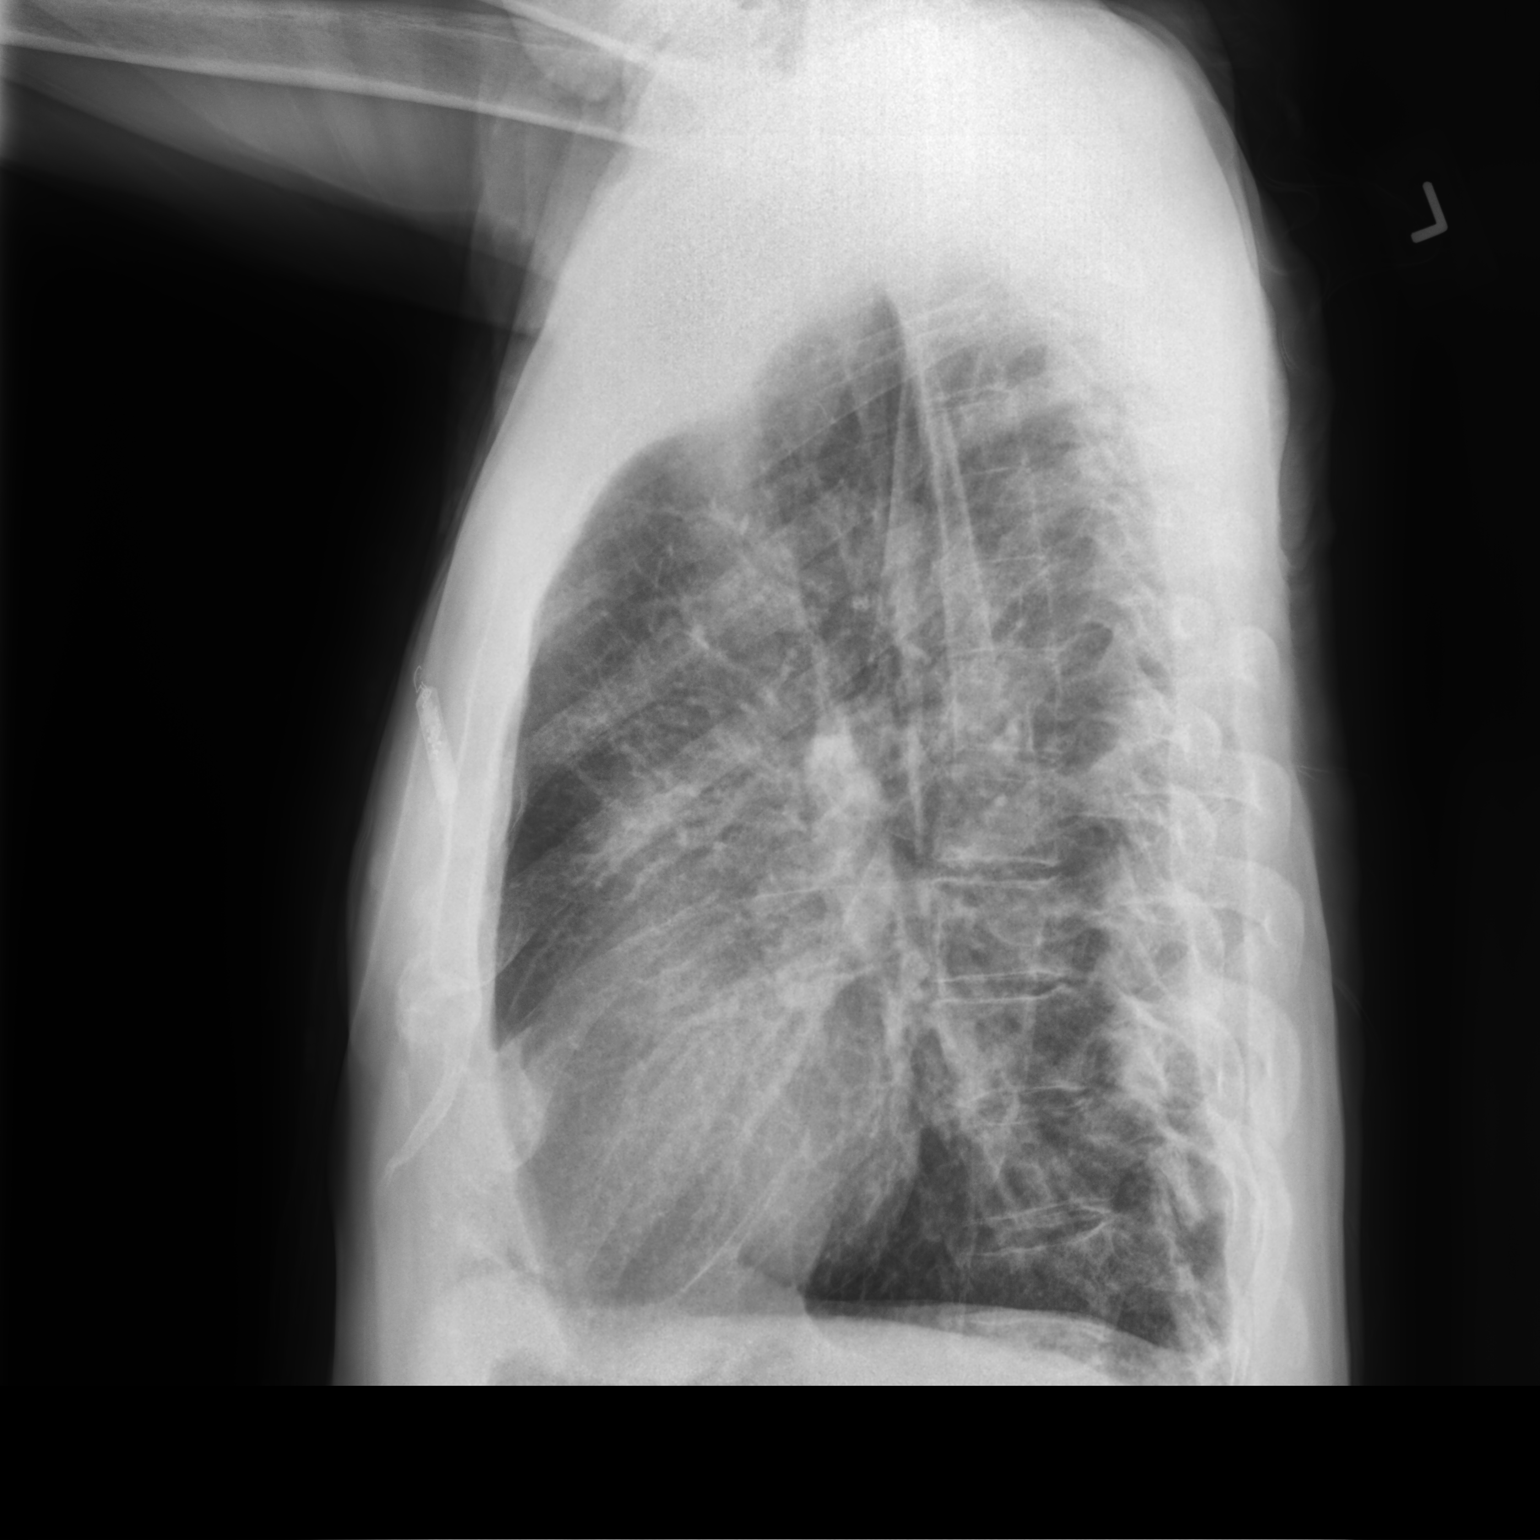

[2 of 2 positions shown; findings below may reference images not displayed]

FINDINGS: An ovoid electronic device projects over the left chest. This may
reflect a loop recorder or leadless pacer device. Heart size within
normal limits. Aortic atherosclerosis. Moderate-sized airspace
opacity within the right lower lobe and right infrahilar region.
Additionally, there is suggestion of a subtle nodular airspace
opacity within the left upper lobe. No evidence of pleural effusion
or pneumothorax. No acute bony abnormality identified.
IMPRESSION: Moderate-sized airspace opacity within the right lower lobe and
right infrahilar region, compatible with pneumonia in the
appropriate clinical setting. Followup PA and lateral chest
radiographs are recommended in 3-4 weeks following trial of
antibiotic therapy to ensure resolution and exclude underlying
malignancy.

Suggestion of a subtle nodular airspace opacity within the left
upper lobe. This may also be infectious/inflammatory in etiology.
However, if this finding persists at time of radiographic follow-up,
a chest CT is recommended for further evaluation.

Aortic Atherosclerosis ([id number]-MMU.U).

## 2023-09-17 IMAGING — DX DG CHEST 1V PORT
1 series · 2 of 2 positions shown · non-contrast
Comparison: Chest CT 0960 hours today and earlier.

CLINICAL DATA: 80-year-old male with shortness of breath. Abnormal
chest CT.

EXAM:
PORTABLE CHEST 1 VIEW

[Series 1: chest · 0.14mm/px · 2 of 2 slices shown]
[im 1/2]
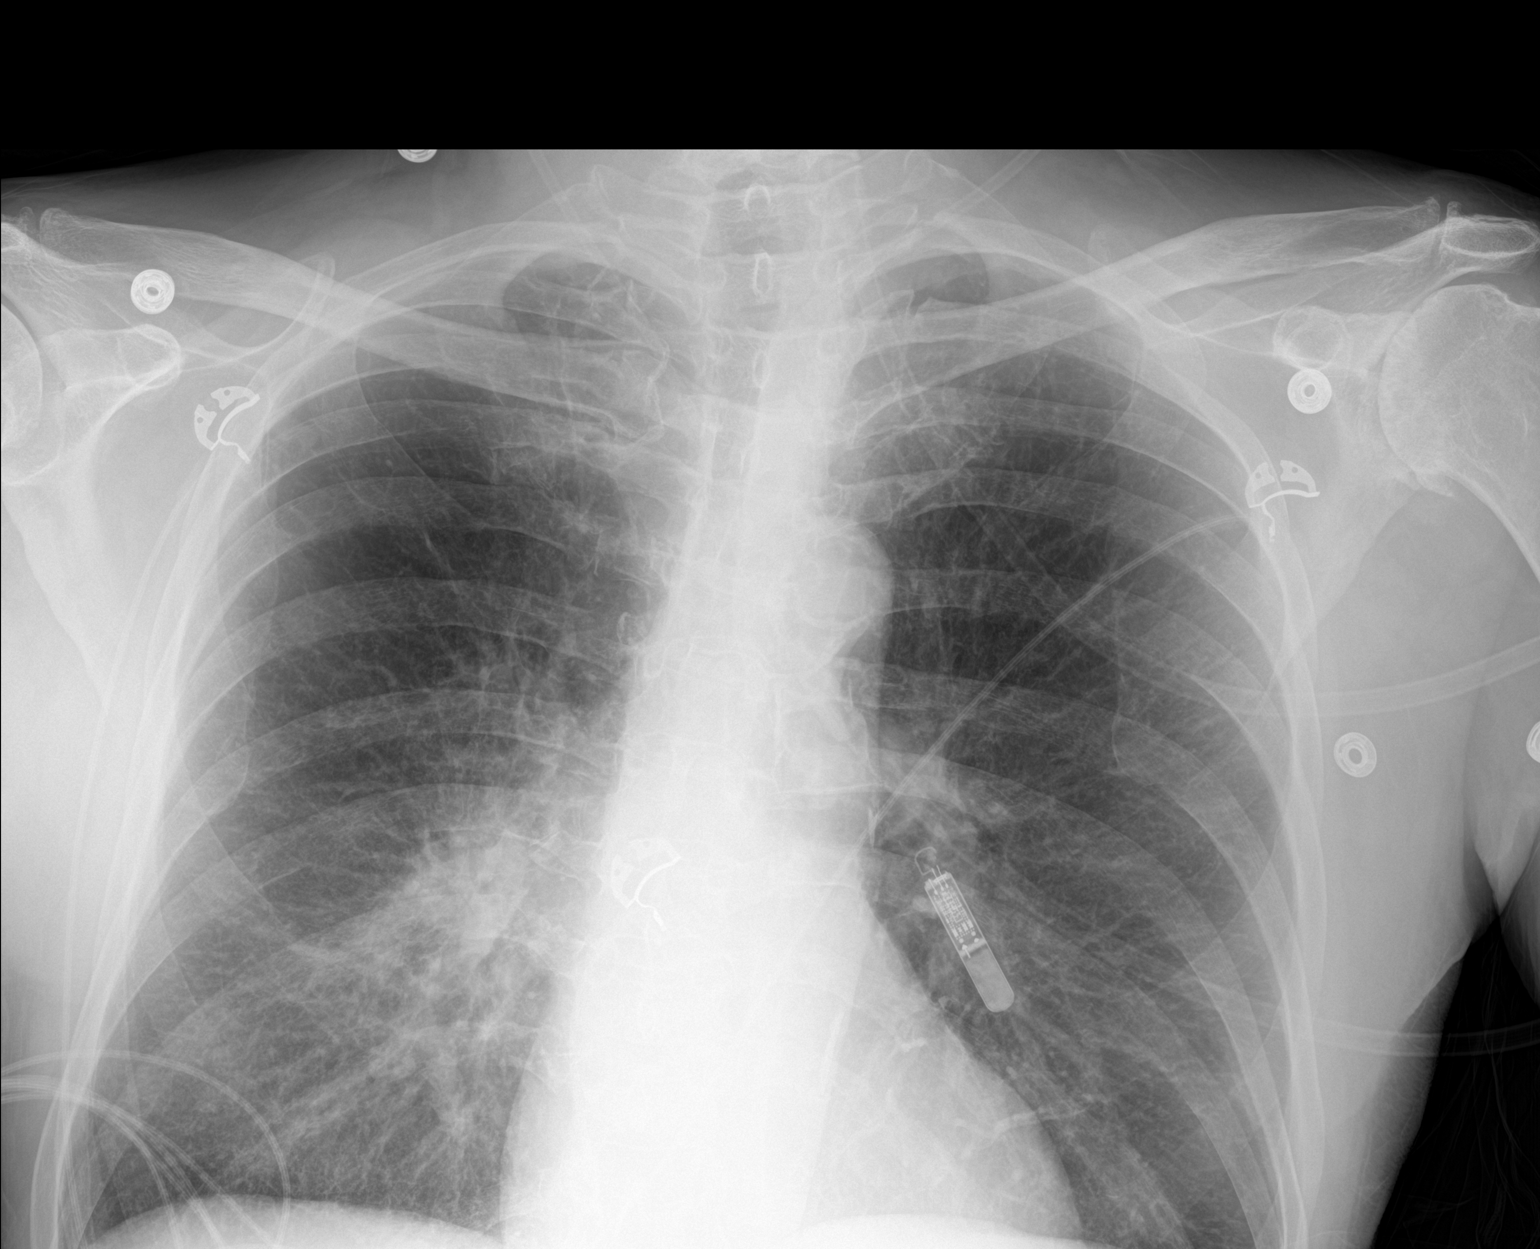
[im 2/2]
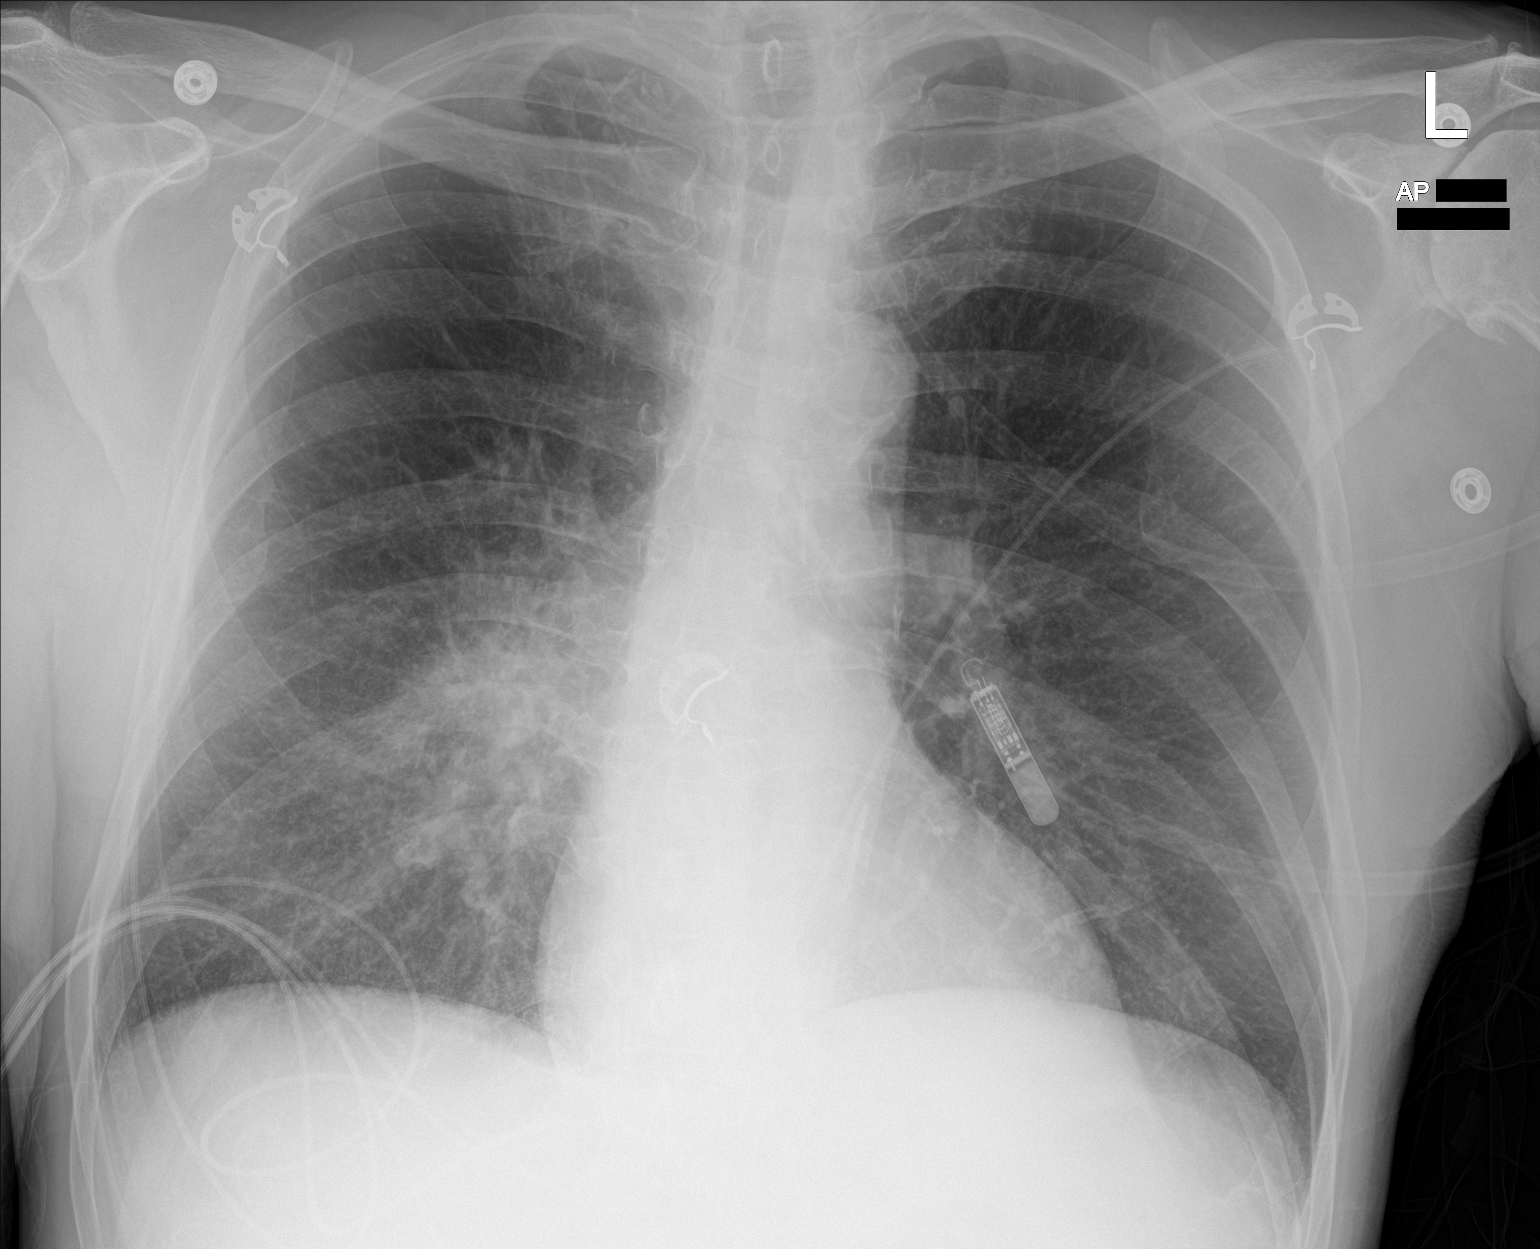

[2 of 2 positions shown; findings below may reference images not displayed]

FINDINGS: Portable AP upright view at 7577 hours. Spiculated density
projecting over the right inferior hilum corresponding to the
dominant abnormality on CT. Otherwise normal cardiac size and
mediastinal contours. Cardiac loop recorder or lead less ICD at the
left chest. Visualized tracheal air column is within normal limits.
Stable ventilation. No pneumothorax or pleural effusion.

No acute osseous abnormality identified.
IMPRESSION: 1. Unchanged from the CT at 0960 hours today with dominant
spiculated opacity projecting over the right hilum.
2. No new cardiopulmonary abnormality.

## 2023-10-14 ENCOUNTER — Other Ambulatory Visit: Payer: Self-pay | Admitting: Family Medicine

## 2023-10-14 DIAGNOSIS — L308 Other specified dermatitis: Secondary | ICD-10-CM

## 2023-10-26 ENCOUNTER — Telehealth: Payer: Self-pay | Admitting: Pharmacist

## 2023-10-26 DIAGNOSIS — J4489 Other specified chronic obstructive pulmonary disease: Secondary | ICD-10-CM

## 2023-10-26 MED ORDER — DUPIXENT 300 MG/2ML ~~LOC~~ SOAJ: 300.0000 mg | SUBCUTANEOUS | 0 refills | Status: DC

## 2023-10-26 NOTE — Telephone Encounter (Signed)
 Refill sent for DUPIXENT  to [facility]: [phone]  Dose: 300mg  subcut eery 14 days  Last OV: 04/13/23 Provider: Dr. Dione Franks  Next OV: due in April 2025 (not scheduled)  Routing to scheduling team for follow-up on appt scheduling  Geraldene Kleine, PharmD, MPH, BCPS Clinical Pharmacist (Rheumatology and Pulmonology)

## 2023-10-27 ENCOUNTER — Ambulatory Visit (INDEPENDENT_AMBULATORY_CARE_PROVIDER_SITE_OTHER): Admitting: Family Medicine

## 2023-10-27 ENCOUNTER — Encounter: Payer: Self-pay | Admitting: Family Medicine

## 2023-10-27 VITALS — BP 157/79 | HR 75 | Ht 71.0 in | Wt 169.1 lb

## 2023-10-27 DIAGNOSIS — K1370 Unspecified lesions of oral mucosa: Secondary | ICD-10-CM | POA: Diagnosis not present

## 2023-10-27 DIAGNOSIS — G8929 Other chronic pain: Secondary | ICD-10-CM

## 2023-10-27 DIAGNOSIS — M25512 Pain in left shoulder: Secondary | ICD-10-CM

## 2023-10-27 DIAGNOSIS — I482 Chronic atrial fibrillation, unspecified: Secondary | ICD-10-CM

## 2023-10-27 MED ORDER — METOPROLOL SUCCINATE ER 25 MG PO TB24: 25.0000 mg | ORAL_TABLET | Freq: Every day | ORAL | 3 refills | Status: AC

## 2023-10-27 NOTE — Progress Notes (Unsigned)
   Established Patient Office Visit  Subjective   Patient ID: Mario Proctor, male    DOB: Jan 31, 1941  Age: 83 y.o. MRN: [id number]  Chief Complaint  Patient presents with   Medical Management of Chronic Issues    HPI  Shoulder   HTN  itching     The ASCVD Risk score (Arnett DK, et al., 2019) failed to calculate for the following reasons:   The 2019 ASCVD risk score is only valid for ages 55 to 1   Risk score cannot be calculated because patient has a medical history suggesting prior/existing ASCVD  Health Maintenance Due  Topic Date Due   Pneumococcal Vaccine: 50+ Years (1 of 2 - PCV) 12/06/1960   Zoster Vaccines- Shingrix (1 of 2) Never done   COVID-19 Vaccine (4 - 2024-25 season) 01/10/2023   Medicare Annual Wellness (AWV)  11/03/2023      Objective:     There were no vitals taken for this visit. {Vitals History (Optional):23777}  Physical Exam   No results found for any visits on 10/27/23.      Assessment & Plan:   There are no diagnoses linked to this encounter.   No follow-ups on file.    Laneta Pintos, MD

## 2023-10-27 NOTE — Patient Instructions (Addendum)
 It was nice to see you today,  We addressed the following topics today: - if your shoulder pain starts to get worse, try using tylenol , topical pain relievers and topical heat.   If it is not improving I can send in a referral to physical therapy. - I am sending in your metoprolol  again.  You should be taking this from what I can tell in your chart review..  I will send a message to Dr. Berry Bristol of this and the amiodarone .  Have a great day,  Etha Henle, MD

## 2023-10-28 ENCOUNTER — Other Ambulatory Visit: Payer: Self-pay | Admitting: *Deleted

## 2023-10-28 DIAGNOSIS — I10 Essential (primary) hypertension: Secondary | ICD-10-CM

## 2023-10-29 ENCOUNTER — Other Ambulatory Visit: Payer: Self-pay | Admitting: Oral Surgery

## 2023-11-02 ENCOUNTER — Other Ambulatory Visit

## 2023-11-02 DIAGNOSIS — I10 Essential (primary) hypertension: Secondary | ICD-10-CM

## 2023-11-02 LAB — SURGICAL PATHOLOGY

## 2023-11-03 LAB — CBC WITH DIFFERENTIAL/PLATELET
Basophils Absolute: 0.1 10*3/uL (ref 0.0–0.2)
Basos: 1 %
EOS (ABSOLUTE): 0.6 10*3/uL — ABNORMAL HIGH (ref 0.0–0.4)
Eos: 6 %
Hematocrit: 39.9 % (ref 37.5–51.0)
Hemoglobin: 13 g/dL (ref 13.0–17.7)
Immature Grans (Abs): 0 10*3/uL (ref 0.0–0.1)
Immature Granulocytes: 0 %
Lymphocytes Absolute: 2.5 10*3/uL (ref 0.7–3.1)
Lymphs: 25 %
MCH: 30.4 pg (ref 26.6–33.0)
MCHC: 32.6 g/dL (ref 31.5–35.7)
MCV: 93 fL (ref 79–97)
Monocytes Absolute: 0.8 10*3/uL (ref 0.1–0.9)
Monocytes: 8 %
Neutrophils Absolute: 5.9 10*3/uL (ref 1.4–7.0)
Neutrophils: 60 %
Platelets: 280 10*3/uL (ref 150–450)
RBC: 4.28 x10E6/uL (ref 4.14–5.80)
RDW: 12.1 % (ref 11.6–15.4)
WBC: 9.9 10*3/uL (ref 3.4–10.8)

## 2023-11-03 LAB — COMPREHENSIVE METABOLIC PANEL WITH GFR
ALT: 10 IU/L (ref 0–44)
AST: 17 IU/L (ref 0–40)
Albumin: 4.2 g/dL (ref 3.7–4.7)
Alkaline Phosphatase: 104 IU/L (ref 44–121)
BUN/Creatinine Ratio: 16 (ref 10–24)
BUN: 29 mg/dL — ABNORMAL HIGH (ref 8–27)
Bilirubin Total: 0.5 mg/dL (ref 0.0–1.2)
CO2: 24 mmol/L (ref 20–29)
Calcium: 8.8 mg/dL (ref 8.6–10.2)
Chloride: 100 mmol/L (ref 96–106)
Creatinine, Ser: 1.85 mg/dL — ABNORMAL HIGH (ref 0.76–1.27)
Globulin, Total: 2.6 g/dL (ref 1.5–4.5)
Glucose: 74 mg/dL (ref 70–99)
Potassium: 5.6 mmol/L — ABNORMAL HIGH (ref 3.5–5.2)
Sodium: 138 mmol/L (ref 134–144)
Total Protein: 6.8 g/dL (ref 6.0–8.5)
eGFR: 36 mL/min/{1.73_m2} — ABNORMAL LOW (ref >59)

## 2023-11-04 DIAGNOSIS — K1370 Unspecified lesions of oral mucosa: Secondary | ICD-10-CM | POA: Insufficient documentation

## 2023-11-04 DIAGNOSIS — G8929 Other chronic pain: Secondary | ICD-10-CM | POA: Insufficient documentation

## 2023-11-04 NOTE — Assessment & Plan Note (Signed)
 From what I can tell in the pts chart review, pt is still supposed to be taking his metoprolol , but has not recently, so we refilled this (last refill in February). It's less clear if he should be taking his amiodarone  still, but it looks like he has not had that refilled in 6 months or more, so I did not refill this.  Will message his cardiologist regarding the amiodarone .

## 2023-11-04 NOTE — Assessment & Plan Note (Signed)
-   Small lesion on roof of mouth    - Scheduled for removal by oral surgeon on 10/30/2023    - Local anesthesia planned

## 2023-11-04 NOTE — Assessment & Plan Note (Signed)
-   History of bone-on-bone changes on x-ray 10 years ago    - First flare in 1-2+ years, now improving    - Pain with certain movements, especially external rotation    - Conservative management recommended given comorbidities    - Tylenol  for pain as needed, nsaids contraindicated due to anticoagulation    - Advised topical treatments, heating pad    - Will consider physical therapy if symptoms recur or worsen

## 2023-11-08 ENCOUNTER — Telehealth: Payer: Self-pay | Admitting: *Deleted

## 2023-11-08 NOTE — Telephone Encounter (Signed)
 Left message to call office

## 2023-11-08 NOTE — Telephone Encounter (Signed)
 Patient notified

## 2023-11-08 NOTE — Telephone Encounter (Signed)
-----   Message from Gordy Bergamo sent at 11/04/2023  6:52 PM EDT ----- Regarding: Patient not taking amiodarone  100 mg daily  Dan thank you so much.  If he is not taking the medication that is fine not refill it and appreciate you refilling the metoprolol .  We will follow-up on this.  Pat let the patient know that he does not need to refill amiodarone . ----- Message ----- From: Chandra Toribio POUR, MD Sent: 11/04/2023   8:00 AM EDT To: Gordy Bergamo, MD  [name] has not been taking his metoprolol  or amiodarone  recently.  I refilled his metoprolol , but was unsure if he was still supposed to be on amiodarone  so I did not fill this and advised him I would message you regarding the amiodarone .

## 2024-01-11 ENCOUNTER — Telehealth: Payer: Self-pay

## 2024-01-11 ENCOUNTER — Ambulatory Visit (INDEPENDENT_AMBULATORY_CARE_PROVIDER_SITE_OTHER)

## 2024-01-11 ENCOUNTER — Ambulatory Visit: Payer: Self-pay | Admitting: Nurse Practitioner

## 2024-01-11 ENCOUNTER — Ambulatory Visit: Admitting: Nurse Practitioner

## 2024-01-11 ENCOUNTER — Encounter: Payer: Self-pay | Admitting: Nurse Practitioner

## 2024-01-11 VITALS — BP 132/58 | HR 83 | Ht 71.0 in | Wt 171.8 lb

## 2024-01-11 DIAGNOSIS — J4489 Other specified chronic obstructive pulmonary disease: Secondary | ICD-10-CM

## 2024-01-11 DIAGNOSIS — J069 Acute upper respiratory infection, unspecified: Secondary | ICD-10-CM | POA: Diagnosis not present

## 2024-01-11 MED ORDER — METHYLPREDNISOLONE ACETATE 80 MG/ML IJ SUSP
80.0000 mg | Freq: Once | INTRAMUSCULAR | Status: AC
Start: 2024-01-11 — End: 2024-01-11
  Administered 2024-01-11: 80 mg via INTRAMUSCULAR

## 2024-01-11 MED ORDER — PREDNISONE 10 MG PO TABS: ORAL_TABLET | ORAL | 0 refills | Status: DC

## 2024-01-11 MED ORDER — AZITHROMYCIN 250 MG PO TABS: ORAL_TABLET | ORAL | 0 refills | Status: DC

## 2024-01-11 MED ORDER — DUPIXENT 300 MG/2ML ~~LOC~~ SOAJ: 300.0000 mg | SUBCUTANEOUS | 1 refills | Status: AC

## 2024-01-11 MED ORDER — FLUTICASONE PROPIONATE 50 MCG/ACT NA SUSP: 2.0000 | Freq: Every day | NASAL | 2 refills | Status: DC

## 2024-01-11 NOTE — Progress Notes (Signed)
 CXR with changes of acute bronchitis. He was started on z pack and prednisone  during our OV, which will treat this. Continue plan as discussed. Thanks.

## 2024-01-11 NOTE — Patient Instructions (Addendum)
 Continue Albuterol  inhaler 2 puffs or 3 mL neb every 6 hours as needed for shortness of breath or wheezing. Notify if symptoms persist despite rescue inhaler/neb use. Use nebs 2-3 times a day until symptoms improve then as needed  Continue Breztri  2 puffs Twice daily. Brush tongue and rinse mouth afterwards Continue dupixent  injections every 2 weeks Continue supplemental oxygen  2  lpm as needed with activity and at night for goal >88-90%  Prednisone  taper. 4 tabs for 2 days, then 3 tabs for 2 days, 2 tabs for 2 days, then 1 tab for 2 days, then stop. Take in AM with food. Start tomorrow Azithromycin  (z pack) - take 2 tablets on day one then 1 tablet daily for four additional days. Take with food  Guaifenesin  or guaifenesin  DM (Mucinex  or Mucinex  DM; store brand version is fine) over the counter for cough/congestion Flonase  nasal spray 2 sprays each nostril daily for nasal congestion/drainage   Chest x ray   Follow up in 2-3 weeks with Dr. Meade or Katie Cherylin Waguespack,NP. If symptoms do not improve or worsen, please contact office for sooner follow up or seek emergency care.

## 2024-01-11 NOTE — Telephone Encounter (Signed)
 Refill sent for DUPIXENT  to [facility]: [phone]  Dose: 300mg  [place] every 14 days   Last OV: 04/13/2023 Provider: Dr. Meade HARPER today: 01/11/24 with Izetta Rouleau, NP   Aleck Puls, PharmD, BCPS Clinical Pharmacist  [facility]

## 2024-01-11 NOTE — Telephone Encounter (Signed)
 Sari with the [facility] pharmacy  called stating the Pt was Rx Azithromycin  but this also has a drug interaction with hydroxyzine  and can cause arhythmia. I asked Dr Neysa since Izetta Rouleau, NP is out of office this afternoon and he stated that as long as the pt is not having any Cardiology issues actively and he is doing okay then it would be safe for him to take Azithromycin . Sari informed. NFN

## 2024-01-11 NOTE — Progress Notes (Signed)
 @Patient  ID: Mario Proctor, male    DOB: 10/23/1940, 83 y.o.   MRN: [id number]  Chief Complaint  Patient presents with   Medical Management of Chronic Issues    PT states SOB x 3 weeks     Referring provider: Chandra Toribio POUR, MD  HPI: 83 year old male, former smoker (57 pack year history) followed for severe asthma/COPD on biologic. He is a patient of Dr. Correne and last seen in office 04/13/2023. Past medical history significant for a fib, cardiomyopathy, chronic combined systolic and diastolic HF. He is also followed by the lung cancer screening program.  TEST/EVENTS:  01/07/2017 PFTs: FVC 3.96 (89%), FEV1 1.86 (58), ratio 47, TLC 113%, DLCO uncorrected 17.71 (52%).  Moderate obstructive airways disease 10/14/2020 CT chest without contrast: Atherosclerosis.  Scattered small mediastinal lymph nodes evident.  Centrilobular and paraseptal emphysema.  Previously identified nodular area in RLL resolved; some very subtle groundglass attenuation.  No new suspicious pulmonary nodule or mass.  Tiny nodule left apex unchanged.  Stable 2 cm exophytic lesion upper pole right kidney.  Recommended return for lung cancer screening. 03/15/2020 echocardiogram: EF 20 to 25%.  LV moderately dilated.  Dilated cardiomyopathy.  Mild LVH.  G1 DD.  Mild to moderate mitral regurgitation.  Mild to moderate tricuspid regurgitation.  Moderate pulmonary hypertension.  IVC dilated with poor inspiration collapse consistent with elevated right atrial pressure. 11/06/2020 echocardiogram: LVEF 30 to 35%.  Severe global hypokinesis.  G1 DD.  LA severely dilated.  Mild mitral regurgitation.  Mild tricuspid regurgitation.  Improvement in PASP from 44 to 20 mmHg.  05/03/2019 CT chest without contrast: Atherosclerosis.  Small mediastinal lymph nodes, including a 9 mm short axis AP window node, likely reactive.  2.7 x 4.2 cm irregular/spiculated masslike opacity in the posterior RLL.  Mild associated hyperdensity within the lesion.   Adjacent debris's within RLL bronchi.  1.4 x 2.6 cm irregular nodular opacity in the posterior RUL and 7x12 mm irregular nodular opacity in the posterior LUL. Additional mild irregular linear/nodular opacity in RUL. Moderate centrilobular and paraseptal emphysematous changes. Mild superior endplate compression fx at L1. 05/04/2021 echocardiogram: LVEF 45 to 50%.  Mild LVH.  Diastolic parameters normal.  Trivial MVR.  No evidence of pulmonary hypertension 04/20/2022 CT chest: atherosclerosis. Severe emphysema. Changes of chronic bronchitis. Indistinct 4 mm LUL nodule, stable and considered benign. 2.1 cm ground glass LUL resolved. Simple kidney cyst. Chronic mild T12 fx.   04/13/2023: OV with Dr. Meade. Former pt of Dr. Gladis. Here to establish. Been on Dupixent  since October. Doing well on this and Breztri . Last prednisone  August 2024. Independent with ADLs. Using albuterol  2-3 x/day. Still smoking 3-4/day. Completed pulm rehab 2023.   01/11/2024: Today - follow up Discussed the use of AI scribe software for clinical note transcription with the patient, who gave verbal consent to proceed.  History of Present Illness Mario Proctor is an 83 year old male who presents with increased shortness of breath. His wife is with him and helps tor provide history.   He has experienced increased shortness of breath over the past couple of weeks. Symptoms started with a cough and a lot of mucus. He still has a cough with some phlegm, which is less severe than before, but he has been experiencing postnasal drainage causing him to feel 'choked up' during sleep the last two nights. He has not been using saline rinses or nasal sprays. He's not sure what color the mucus is.  He uses his albuterol  rescue inhaler more frequently, multiple times a day. He also uses nebulizer treatments twice daily, consistent with his usual routine. No missed doses of Xarelto  have occurred, and he had his last Dupixent  injection last  Wednesday. He is awaiting the next shipment of Dupixent  due to pending paperwork.  No fever, hemoptysis, or significant changes in sputum color are present. The patient reports that wheezing is noticed when he gets up and moves. He has not used antibiotics recently. No sore throat, ear pain, facial tenderness, CP, leg swelling.     No Known Allergies  Immunization History  Administered Date(s) Administered   Fluad Quad(high Dose 65+) 02/23/2019, 02/08/2021   INFLUENZA, HIGH DOSE SEASONAL PF 02/15/2016, 04/16/2017, 04/04/2018, 03/11/2020, 01/19/2023   Influenza-Unspecified 03/11/2022   PFIZER(Purple Top)SARS-COV-2 Vaccination 06/15/2019, 07/10/2019, 04/02/2020   Pneumococcal-Unspecified 10/16/2010   Tdap 08/29/2020    Past Medical History:  Diagnosis Date   A-fib [facility])    Allergy    CHF (congestive heart failure) (HCC)    Dupuytren contracture    right sm finger   Myocardial infarct (HCC) 2019   Myocardial infarction [facility])    Small bowel obstruction (HCC)    Stage 4 very severe COPD by GOLD classification (HCC) 05/14/2020    Tobacco History: Social History   Tobacco Use  Smoking Status Some Days   Current packs/day: 2.00   Average packs/day: 2.0 packs/day for 50.0 years (100.0 ttl pk-yrs)   Types: Cigarettes   Passive exposure: Current  Smokeless Tobacco Never  Tobacco Comments   Sneaks a cigarette a couple times per day (puts it out) currently.  Trying to quit.  Updated 02/09/2023   Ready to quit: Not Answered Counseling given: Not Answered Tobacco comments: Sneaks a cigarette a couple times per day (puts it out) currently.  Trying to quit.  Updated 02/09/2023   Outpatient Medications Prior to Visit  Medication Sig Dispense Refill   albuterol  (ACCUNEB ) 1.25 MG/3ML nebulizer solution Take 3 mLs (1.25 mg total) by nebulization every 4 (four) hours as needed for wheezing or shortness of breath. 75 mL 11   albuterol  (VENTOLIN  HFA) 108 (90 Base) MCG/ACT inhaler INHALE 2  PUFFS EVERY 6 HOURS AS NEEDED FOR WHEEZE OR SHORTNESS OF BREATH 18 each 11   amLODipine  (NORVASC ) 5 MG tablet Take 1 tablet (5 mg total) by mouth daily. 90 tablet 3   atorvastatin  (LIPITOR) 10 MG tablet Take 1 tablet (10 mg total) by mouth daily. 90 tablet 3   Budeson-Glycopyrrol-Formoterol  (BREZTRI  AEROSPHERE) 160-9-4.8 MCG/ACT AERO Inhale 2 puffs into the lungs in the morning and at bedtime. 10.7 g 5   clobetasol  cream (TEMOVATE ) 0.05 % Apply 1 Application topically 2 (two) times daily as needed. 80 g 3   Dupilumab  (DUPIXENT ) 300 MG/2ML SOAJ Inject 300 mg into the skin every 14 (fourteen) days. 12 mL 0   hydrOXYzine  (VISTARIL ) 25 MG capsule TAKE 1 CAPSULE BY MOUTH AT BEDTIME AS NEEDED FOR ITCHING 90 capsule 1   lisinopril  (ZESTRIL ) 10 MG tablet TAKE 1 TABLET BY MOUTH EVERYDAY AT BEDTIME 90 tablet 3   metoprolol  succinate (TOPROL -XL) 25 MG 24 hr tablet Take 1 tablet (25 mg total) by mouth daily. 90 tablet 3   rivaroxaban  (XARELTO ) 20 MG TABS tablet Take 1 tablet (20 mg total) by mouth daily with supper. 90 tablet 3   No facility-administered medications prior to visit.     Review of Systems:   Constitutional: No weight loss or gain, night sweats, fevers, chills, fatigue,  or lassitude.  HEENT: No headaches, difficulty swallowing, tooth/dental problems, or sore throat. No sneezing, itching, ear ache + nasal congestion, post nasal drip CV:  No chest pain, orthopnea, PND, swelling in lower extremities, anasarca, dizziness, palpitations, syncope Resp: +shortness of breath with exertion; productive cough; wheezing. No hemoptysis. No chest wall deformity GI:  No heartburn, indigestion, abdominal pain, nausea, vomiting, diarrhea, change in bowel habits, loss of appetite, bloody stools.  GU: No dysuria, change in color of urine, urgency or frequency.  No flank pain, no hematuria  Skin: No rash, lesions, ulcerations MSK:  No joint pain or swelling.   Neuro: No dizziness or lightheadedness.  Psych:  No depression or anxiety. Mood stable.     Physical Exam:  BP (!) 132/58   Pulse 83   Ht 5' 11 (1.803 m)   Wt 171 lb 12.8 oz (77.9 kg)   SpO2 91%   BMI 23.96 kg/m   GEN: Pleasant, interactive, chronically-ill appearing; in no acute distress. HEENT:  Normocephalic and atraumatic. EACs patent bilaterally. TM pearly gray with present light reflex bilaterally. PERRLA. Sclera white. Nasal turbinates erythematous, moist and patent bilaterally. No rhinorrhea present. Oropharynx pink and moist, without exudate or edema. No lesions, ulcerations, or postnasal drip.  NECK:  Supple w/ fair ROM. No JVD present. No lymphadenopathy.   CV: Irregular rhythm, rate controlled, no m/r/g, no peripheral edema. Pulses intact, +2 bilaterally. No cyanosis, pallor or clubbing. PULMONARY:  Unlabored, regular breathing. Scattered wheeze bilaterally; decreased airflow b/l. No accessory muscle use. No dullness to percussion.  GI: BS present and normoactive. Soft, non-tender to palpation. No organomegaly or masses detected. MSK: No erythema, warmth or tenderness. Cap refil <2 sec all extrem. Muscle wasting  Neuro: A/Ox3. No focal deficits noted.   Skin: Warm, no lesions or rashe Psych: Normal affect and behavior. Judgement and thought content appropriate.     Lab Results:  CBC    Component Value Date/Time   WBC 9.9 11/02/2023 1017   WBC 7.6 04/24/2022 1002   RBC 4.28 11/02/2023 1017   RBC 4.69 04/24/2022 1002   HGB 13.0 11/02/2023 1017   HCT 39.9 11/02/2023 1017   PLT 280 11/02/2023 1017   MCV 93 11/02/2023 1017   MCH 30.4 11/02/2023 1017   MCH 31.4 05/06/2021 0607   MCHC 32.6 11/02/2023 1017   MCHC 33.0 04/24/2022 1002   RDW 12.1 11/02/2023 1017   LYMPHSABS 2.5 11/02/2023 1017   MONOABS 0.7 04/24/2022 1002   EOSABS 0.6 (H) 11/02/2023 1017   BASOSABS 0.1 11/02/2023 1017    BMET    Component Value Date/Time   NA 138 11/02/2023 1017   K 5.6 (H) 11/02/2023 1017   CL 100 11/02/2023 1017   CO2  24 11/02/2023 1017   GLUCOSE 74 11/02/2023 1017   GLUCOSE 95 01/23/2022 1039   BUN 29 (H) 11/02/2023 1017   CREATININE 1.85 (H) 11/02/2023 1017   CALCIUM  8.8 11/02/2023 1017   GFRNONAA 52 (L) 05/06/2021 0607   GFRAA 45 (L) 07/04/2020 0920    BNP    Component Value Date/Time   BNP 292.4 (H) 05/06/2021 0607     Imaging:  No results found.  Administration History     None          Latest Ref Rng & Units 09/14/2022    9:47 AM 01/07/2017   12:37 PM  PFT Results  FVC-Pre L 2.71  3.63   FVC-Predicted Pre % 67  82   FVC-Post L 2.88  3.96   FVC-Predicted Post % 71  89   Pre FEV1/FVC % % 38  48   Post FEV1/FCV % % 37  47   FEV1-Pre L 1.02  1.74   FEV1-Predicted Pre % 35  54   FEV1-Post L 1.07  1.86   DLCO uncorrected ml/min/mmHg 6.56  15.81   DLCO UNC% % 26  46   DLCO corrected ml/min/mmHg 6.56  17.71   DLCO COR %Predicted % 26  52   DLVA Predicted % 29  55   TLC L 10.18  8.24   TLC % Predicted % 144  113   RV % Predicted % 242  152     No results found for: NITRICOXIDE      Assessment & Plan:   Assessment & Plan Chronic obstructive pulmonary disease/asthma with acute exacerbation Acute exacerbation of asthma/COPD characterized by increased dyspnea, cough, wheezing, and increased use of rescue inhaler, likely triggered by a viral respiratory infection and postnasal drainage. Oxygen  saturation was borderline low at 91% in the office. No fever or hemoptysis reported. CXR to rule out superimposed infection. Depo 80 mg inj x 1 in office. Treat with empiric azithromycin  and prednisone  taper. Mucociliary clearance therapies and target postnasal drainage. Emphasized the importance of avoiding hypoxia and potential complications. Action plan in place. ED/return precautions reviewed.  - Prescribe azithromycin  (Z-Pak): 2 tablets on the first day, then 1 tablet daily for 4 days. - Prescribe prednisone : 4 tablets for 2 days, 3 tablets for 2 days, 2 tablets for 2 days, 1  tablet for 2 days. - Advise use of Mucinex  DM to help control cough and thin mucus. - Order chest x-ray to rule out pneumonia. - Instruct to monitor oxygen  levels at home and use supplemental oxygen  if saturation is 88% or below. - Advise to seek immediate medical attention if experiencing low oxygen  levels, hemoptysis, or worsening symptoms. - Utilize nebs 2-3 times a day until symptoms improve - Continue Breztri  Twice daily  - Continue dupixent  injections every 2 weeks   URI URI with rhinitis contributing to postnasal drainage and cough, particularly at night. No facial pain or pressure reported. - Prescribe Flonase  nasal spray: 2 sprays in each nostril daily. - Recommend saline nasal sprays to help flush out nasal passages.  Chronic hypoxic respiratory failure Stable without increased O2 requirement. Goal >88-90%. See above - Continue supplemental oxygen  2 lpm as needed with activity for goal >88-90% and at night      Comer LULLA Rouleau, NP 01/11/2024  Pt aware and understands NP's role.

## 2024-01-13 ENCOUNTER — Ambulatory Visit

## 2024-01-19 ENCOUNTER — Other Ambulatory Visit: Payer: Self-pay | Admitting: Cardiology

## 2024-01-25 ENCOUNTER — Ambulatory Visit: Admitting: Nurse Practitioner

## 2024-01-25 ENCOUNTER — Telehealth: Payer: Self-pay

## 2024-01-25 ENCOUNTER — Encounter: Payer: Self-pay | Admitting: Nurse Practitioner

## 2024-01-25 VITALS — BP 128/58 | HR 60 | Temp 97.9°F

## 2024-01-25 DIAGNOSIS — J9611 Chronic respiratory failure with hypoxia: Secondary | ICD-10-CM

## 2024-01-25 DIAGNOSIS — Z9981 Dependence on supplemental oxygen: Secondary | ICD-10-CM | POA: Diagnosis not present

## 2024-01-25 DIAGNOSIS — J4489 Other specified chronic obstructive pulmonary disease: Secondary | ICD-10-CM

## 2024-01-25 MED ORDER — ALBUTEROL SULFATE 1.25 MG/3ML IN NEBU: 1.0000 | INHALATION_SOLUTION | RESPIRATORY_TRACT | 5 refills | Status: AC | PRN

## 2024-01-25 NOTE — Telephone Encounter (Signed)
 New start Ohtuvayre  paperwork started and placed in pharmacy bin.

## 2024-01-25 NOTE — Patient Instructions (Addendum)
 Continue Albuterol  inhaler 2 puffs or 3 mL neb every 6 hours as needed for shortness of breath or wheezing. Notify if symptoms persist despite rescue inhaler/neb use. Use nebs 2-3 times a day until symptoms improve then as needed  Continue Breztri  2 puffs Twice daily. Brush tongue and rinse mouth afterwards Continue dupixent  injections every 2 weeks Continue supplemental oxygen  2  lpm as needed with activity and at night for goal >88-90% Continue Guaifenesin  or guaifenesin  DM (Mucinex  or Mucinex  DM; store brand version is fine) over the counter for cough/congestion Continue flonase  nasal spray 2 sprays each nostril daily for nasal congestion/drainage   Start ohtuvayre  2.5 mL neb Twice daily. This is a new maintenance treatment that you will use regardless of how you feel. Notify and stop of any mood changes  Follow up in 6-8 weeks with Dr. Meade or Izetta Wenceslaus Gist,NP. If symptoms do not improve or worsen, please contact office for sooner follow up or seek emergency care.

## 2024-01-25 NOTE — Progress Notes (Signed)
 @Patient  ID: Mario Proctor, male    DOB: May 30, 1940, 83 y.o.   MRN: [id number]  Chief Complaint  Patient presents with   COPD    Productive coughing. Pts wife states he feels good then suddenly feels bad    Referring provider: Chandra Toribio POUR, MD  HPI: 83 year old male, former smoker (57 pack year history) followed for severe asthma/COPD on biologic. He is a patient of Dr. Correne and last seen in office 01/11/2024 by [facility] NP. Past medical history significant for a fib, cardiomyopathy, chronic combined systolic and diastolic HF. He is also followed by the lung cancer screening program.  TEST/EVENTS:  01/07/2017 PFTs: FVC 3.96 (89%), FEV1 1.86 (58), ratio 47, TLC 113%, DLCO uncorrected 17.71 (52%).  Moderate obstructive airways disease 10/14/2020 CT chest without contrast: Atherosclerosis.  Scattered small mediastinal lymph nodes evident.  Centrilobular and paraseptal emphysema.  Previously identified nodular area in RLL resolved; some very subtle groundglass attenuation.  No new suspicious pulmonary nodule or mass.  Tiny nodule left apex unchanged.  Stable 2 cm exophytic lesion upper pole right kidney.  Recommended return for lung cancer screening. 03/15/2020 echocardiogram: EF 20 to 25%.  LV moderately dilated.  Dilated cardiomyopathy.  Mild LVH.  G1 DD.  Mild to moderate mitral regurgitation.  Mild to moderate tricuspid regurgitation.  Moderate pulmonary hypertension.  IVC dilated with poor inspiration collapse consistent with elevated right atrial pressure. 11/06/2020 echocardiogram: LVEF 30 to 35%.  Severe global hypokinesis.  G1 DD.  LA severely dilated.  Mild mitral regurgitation.  Mild tricuspid regurgitation.  Improvement in PASP from 44 to 20 mmHg.  05/03/2019 CT chest without contrast: Atherosclerosis.  Small mediastinal lymph nodes, including a 9 mm short axis AP window node, likely reactive.  2.7 x 4.2 cm irregular/spiculated masslike opacity in the posterior RLL.  Mild associated  hyperdensity within the lesion.  Adjacent debris's within RLL bronchi.  1.4 x 2.6 cm irregular nodular opacity in the posterior RUL and 7x12 mm irregular nodular opacity in the posterior LUL. Additional mild irregular linear/nodular opacity in RUL. Moderate centrilobular and paraseptal emphysematous changes. Mild superior endplate compression fx at L1. 05/04/2021 echocardiogram: LVEF 45 to 50%.  Mild LVH.  Diastolic parameters normal.  Trivial MVR.  No evidence of pulmonary hypertension 04/20/2022 CT chest: atherosclerosis. Severe emphysema. Changes of chronic bronchitis. Indistinct 4 mm LUL nodule, stable and considered benign. 2.1 cm ground glass LUL resolved. Simple kidney cyst. Chronic mild T12 fx.  01/11/2024 CXR: increased chronic interstitial changes with possible central peribronchial thickening suggestive of bronchitis   04/13/2023: OV with Dr. Meade. Former pt of Dr. Gladis. Here to establish. Been on Dupixent  since October. Doing well on this and Breztri . Last prednisone  August 2024. Independent with ADLs. Using albuterol  2-3 x/day. Still smoking 3-4/day. Completed pulm rehab 2023.   01/11/2024: Mario Proctor with Kermit Arnette NP Mario Proctor is an 83 year old male who presents with increased shortness of breath. His wife is with him and helps tor provide history.  He has experienced increased shortness of breath over the past couple of weeks. Symptoms started with a cough and a lot of mucus. He still has a cough with some phlegm, which is less severe than before, but he has been experiencing postnasal drainage causing him to feel 'choked up' during sleep the last two nights. He has not been using saline rinses or nasal sprays. He's not sure what color the mucus is.  He uses his albuterol  rescue inhaler more frequently,  multiple times a day. He also uses nebulizer treatments twice daily, consistent with his usual routine. No missed doses of Xarelto  have occurred, and he had his last Dupixent  injection last  Wednesday. He is awaiting the next shipment of Dupixent  due to pending paperwork. No fever, hemoptysis, or significant changes in sputum color are present. The patient reports that wheezing is noticed when he gets up and moves. He has not used antibiotics recently. No sore throat, ear pain, facial tenderness, CP, leg swelling  01/25/2024: Today - follow up Discussed the use of AI scribe software for clinical note transcription with the patient, who gave verbal consent to proceed.  History of Present Illness   Mario Proctor is an 83 year old male with severe lung disease who presents for follow-up after flare up.   He has experienced improvement in his breathing and a reduction in coughing since his last visit two weeks ago, although he does not feel completely back to his normal state. He was previously treated for bronchitis with z pack and prednisone .   He is currently off prednisone . He is on Dupixent  for maintenance therapy and uses Breztri  as a maintenance therapy and albuterol  for emergency purposes. No increased oxygen  requirements.   He does not recall the color of his productive cough. No significant amount of phlegm to spit out and did not feel worse after stopping prednisone . No fevers, hemoptysis, wheezing, chest congestion.     No Known Allergies  Immunization History  Administered Date(s) Administered   Fluad Quad(high Dose 65+) 02/23/2019, 02/08/2021   INFLUENZA, HIGH DOSE SEASONAL PF 02/15/2016, 04/16/2017, 04/04/2018, 03/11/2020, 01/19/2023   Influenza-Unspecified 03/11/2022   PFIZER(Purple Top)SARS-COV-2 Vaccination 06/15/2019, 07/10/2019, 04/02/2020   Pneumococcal-Unspecified 10/16/2010   Tdap 08/29/2020    Past Medical History:  Diagnosis Date   A-fib [facility])    Allergy    CHF (congestive heart failure) (HCC)    Dupuytren contracture    right sm finger   Myocardial infarct (HCC) 2019   Myocardial infarction [facility])    Small bowel obstruction (HCC)     Stage 4 very severe COPD by GOLD classification (HCC) 05/14/2020    Tobacco History: Social History   Tobacco Use  Smoking Status Some Days   Current packs/day: 2.00   Average packs/day: 2.0 packs/day for 50.0 years (100.0 ttl pk-yrs)   Types: Cigarettes   Passive exposure: Current  Smokeless Tobacco Never  Tobacco Comments   Sneaks a cigarette a couple times per day (puts it out) currently.  Trying to quit.  Updated 01/25/24   Ready to quit: Not Answered Counseling given: Not Answered Tobacco comments: Sneaks a cigarette a couple times per day (puts it out) currently.  Trying to quit.  Updated 01/25/24   Outpatient Medications Prior to Visit  Medication Sig Dispense Refill   albuterol  (VENTOLIN  HFA) 108 (90 Base) MCG/ACT inhaler INHALE 2 PUFFS EVERY 6 HOURS AS NEEDED FOR WHEEZE OR SHORTNESS OF BREATH 18 each 11   atorvastatin  (LIPITOR) 10 MG tablet Take 1 tablet (10 mg total) by mouth daily. 90 tablet 3   Budeson-Glycopyrrol-Formoterol  (BREZTRI  AEROSPHERE) 160-9-4.8 MCG/ACT AERO Inhale 2 puffs into the lungs in the morning and at bedtime. 10.7 g 5   clobetasol  cream (TEMOVATE ) 0.05 % Apply 1 Application topically 2 (two) times daily as needed. 80 g 3   Dupilumab  (DUPIXENT ) 300 MG/2ML SOAJ Inject 300 mg into the skin every 14 (fourteen) days. 12 mL 1   fluticasone  (FLONASE ) 50 MCG/ACT nasal spray Place  2 sprays into both nostrils daily. 18.2 mL 2   hydrOXYzine  (VISTARIL ) 25 MG capsule TAKE 1 CAPSULE BY MOUTH AT BEDTIME AS NEEDED FOR ITCHING 90 capsule 1   lisinopril  (ZESTRIL ) 10 MG tablet TAKE 1 TABLET BY MOUTH EVERYDAY AT BEDTIME 90 tablet 3   metoprolol  succinate (TOPROL -XL) 25 MG 24 hr tablet Take 1 tablet (25 mg total) by mouth daily. 90 tablet 3   XARELTO  20 MG TABS tablet TAKE 1 TABLET BY MOUTH DAILY WITH SUPPER. 90 tablet 3   albuterol  (ACCUNEB ) 1.25 MG/3ML nebulizer solution Take 3 mLs (1.25 mg total) by nebulization every 4 (four) hours as needed for wheezing or shortness of  breath. 75 mL 11   amLODipine  (NORVASC ) 5 MG tablet Take 1 tablet (5 mg total) by mouth daily. (Patient not taking: Reported on 01/25/2024) 90 tablet 3   azithromycin  (ZITHROMAX ) 250 MG tablet Take 2 tablets on day one then take 1 tablet daily for four additional days (Patient not taking: Reported on 01/25/2024) 6 tablet 0   predniSONE  (DELTASONE ) 10 MG tablet 4 tabs for 2 days, then 3 tabs for 2 days, 2 tabs for 2 days, then 1 tab for 2 days, then stop (Patient not taking: Reported on 01/25/2024) 20 tablet 0   No facility-administered medications prior to visit.     Review of Systems: As above    Physical Exam:  BP (!) 128/58   Pulse 60   Temp 97.9 F (36.6 C)   SpO2 91%   GEN: Pleasant, interactive, chronically-ill appearing; in no acute distress. HEENT:  Normocephalic and atraumatic.  PERRLA. Sclera white. Nasal turbinates pink, moist and patent bilaterally. No rhinorrhea present. Oropharynx pink and moist, without exudate or edema. No lesions, ulcerations, or postnasal drip.  NECK:  Supple w/ fair ROM. No JVD present. No lymphadenopathy.   CV: Irregular rhythm, rate controlled, no m/r/g, no peripheral edema. Pulses intact, +2 bilaterally. No cyanosis, pallor or clubbing. PULMONARY:  Unlabored, regular breathing. Decreased airflow b/l without wheezes/rales/rhonchi. No accessory muscle use. No dullness to percussion.  GI: BS present and normoactive. Soft, non-tender to palpation. No organomegaly or masses detected. MSK: No erythema, warmth or tenderness. Cap refil <2 sec all extrem. Muscle wasting  Neuro: A/Ox3. No focal deficits noted.   Skin: Warm, no lesions or rashe Psych: Normal affect and behavior. Judgement and thought content appropriate.     Lab Results:  CBC    Component Value Date/Time   WBC 9.9 11/02/2023 1017   WBC 7.6 04/24/2022 1002   RBC 4.28 11/02/2023 1017   RBC 4.69 04/24/2022 1002   HGB 13.0 11/02/2023 1017   HCT 39.9 11/02/2023 1017   PLT 280  11/02/2023 1017   MCV 93 11/02/2023 1017   MCH 30.4 11/02/2023 1017   MCH 31.4 05/06/2021 0607   MCHC 32.6 11/02/2023 1017   MCHC 33.0 04/24/2022 1002   RDW 12.1 11/02/2023 1017   LYMPHSABS 2.5 11/02/2023 1017   MONOABS 0.7 04/24/2022 1002   EOSABS 0.6 (H) 11/02/2023 1017   BASOSABS 0.1 11/02/2023 1017    BMET    Component Value Date/Time   NA 138 11/02/2023 1017   K 5.6 (H) 11/02/2023 1017   CL 100 11/02/2023 1017   CO2 24 11/02/2023 1017   GLUCOSE 74 11/02/2023 1017   GLUCOSE 95 01/23/2022 1039   BUN 29 (H) 11/02/2023 1017   CREATININE 1.85 (H) 11/02/2023 1017   CALCIUM  8.8 11/02/2023 1017   GFRNONAA 52 (L) 05/06/2021 9392  GFRAA 45 (L) 07/04/2020 0920    BNP    Component Value Date/Time   BNP 292.4 (H) 05/06/2021 0607     Imaging:  DG Chest 2 View Result Date: 01/11/2024 CLINICAL DATA:  COPD acute exacerbation EXAM: CHEST - 2 VIEW COMPARISON:  January 24, 2024 chest radiograph in additional prior studies FINDINGS: Extensive chronic interstitial changes bilaterally again identified. Possible mildly increased central peribronchial thickening. No definite focal consolidation. No pleural effusions. No pneumothorax. Unchanged loop recorder. Unchanged cardiomediastinal silhouette. No acute osseous findings. IMPRESSION: Increased chronic interstitial changes with possible central peribronchial thickening suggestive of bronchitis. No definite focal consolidations. Electronically Signed   By: Michaeline Blanch M.D.   On: 01/11/2024 10:47    methylPREDNISolone  acetate (DEPO-MEDROL ) injection 80 mg     Date Action Dose Route User   01/11/2024 0926 Given 80 mg Intramuscular (Right Upper Outer Quadrant) Armand Burnard SAUNDERS, CMA          Latest Ref Rng & Units 09/14/2022    9:47 AM 01/07/2017   12:37 PM  PFT Results  FVC-Pre L 2.71  3.63   FVC-Predicted Pre % 67  82   FVC-Post L 2.88  3.96   FVC-Predicted Post % 71  89   Pre FEV1/FVC % % 38  48   Post FEV1/FCV % % 37   47   FEV1-Pre L 1.02  1.74   FEV1-Predicted Pre % 35  54   FEV1-Post L 1.07  1.86   DLCO uncorrected ml/min/mmHg 6.56  15.81   DLCO UNC% % 26  46   DLCO corrected ml/min/mmHg 6.56  17.71   DLCO COR %Predicted % 26  52   DLVA Predicted % 29  55   TLC L 10.18  8.24   TLC % Predicted % 144  113   RV % Predicted % 242  152     No results found for: NITRICOXIDE      Assessment & Plan:   Assessment & Plan  Chronic obstructive pulmonary disease (COPD) with recent exacerbation Breathing has improved since last visit with reduced coughing. Slowly improving and lung exam improved today. Advised on strict return precautions. High symptom burden at baseline. Will add on inhaler phosphodiesterase inhibitor with Ohtuvayre  - enrollment completed today. Side effect profile reviewed. Continue aggressive maintenance regimen. Action plan in place.  - Add Ohtuvayre  as a maintenance therapy, use twice daily. - Monitor for side effects such as GI issues, and mood changes. - Continue Breztri   - Continue dupixent  injections - Continue albuterol  PRN  Acute bronchitis, recently treated Chest X-ray showed no pneumonia but evidence of bronchitis. Treatment has improved symptoms, with less productive cough and no significant phlegm production.  Chronic respiratory failure Stable without increased O2 requirement. Goal >88-90% - Continue supplemental oxygen  2 lpm    I spent 35 minutes of dedicated to the care of this patient on the date of this encounter to include pre-visit review of records, face-to-face time with the patient discussing conditions above, post visit ordering of testing, clinical documentation with the electronic health record, making appropriate referrals as documented, and communicating necessary findings to members of the patients care team.   Comer LULLA Rouleau, NP 01/25/2024  Pt aware and understands NP's role.

## 2024-01-27 ENCOUNTER — Telehealth: Payer: Self-pay

## 2024-01-27 ENCOUNTER — Other Ambulatory Visit (HOSPITAL_COMMUNITY): Payer: Self-pay

## 2024-01-27 NOTE — Telephone Encounter (Signed)
 Received Ohtuvayre  new start paperwork. Completed form and faxed with clinicals and insurance card copy to [facility]   Phone#: [phone] Fax#: [phone]

## 2024-01-27 NOTE — Telephone Encounter (Signed)
 Received fax from [employer] confirming receipt of Ohtuvayre  enrollment form  Patient ID: [id number]

## 2024-01-31 NOTE — Telephone Encounter (Signed)
 Received fax from [employer] with summary of benefits. Referral form for Ohtuvayre  received. Rx will be triaged to [facility].. Once benefits investigation completed, pharmacy will reach out the patient to schedule shipment. If medication is unaffordable, patient will need to express financial hardship to be referred back to [place] [facility] for patient assistance program pre-screening.   Patient ID: [id number] Pharmacy phone: [phone] [facility] Phone#: [phone]

## 2024-02-01 NOTE — Telephone Encounter (Signed)
 Copied from CRM #[id number]. Topic: Clinical - Medication Question >> Feb 01, 2024 11:09 AM Benton KIDD wrote: Reason for CRM: patient is calling because he needs to speak with comer rouleau or her nurse concerning a prescription . Patient has questions . Its a new one ohtuvayre  2.5 mL neb Twice daily.please give patient a call back concerning his new prescription  [phone]  Please advise pharmacy.

## 2024-02-01 NOTE — Telephone Encounter (Signed)
 Called patient. He reports copay >$500. He is unsure who he spoke with or what next steps he was given.   Provided [employer] number and patient ID. Encouraged patient to contact [facility] for screening for patient assistance.   Aleck Puls, PharmD, BCPS Clinical Pharmacist  [facility]

## 2024-02-21 ENCOUNTER — Ambulatory Visit: Payer: Self-pay | Admitting: Nurse Practitioner

## 2024-02-21 ENCOUNTER — Ambulatory Visit: Admitting: Nurse Practitioner

## 2024-02-21 ENCOUNTER — Encounter: Payer: Self-pay | Admitting: Nurse Practitioner

## 2024-02-21 VITALS — BP 128/76 | HR 55 | Temp 97.6°F | Ht 71.0 in | Wt 165.4 lb

## 2024-02-21 DIAGNOSIS — J441 Chronic obstructive pulmonary disease with (acute) exacerbation: Secondary | ICD-10-CM | POA: Diagnosis not present

## 2024-02-21 DIAGNOSIS — J9611 Chronic respiratory failure with hypoxia: Secondary | ICD-10-CM

## 2024-02-21 DIAGNOSIS — J302 Other seasonal allergic rhinitis: Secondary | ICD-10-CM

## 2024-02-21 MED ORDER — PREDNISONE 10 MG PO TABS: ORAL_TABLET | ORAL | 0 refills | Status: DC

## 2024-02-21 NOTE — Patient Instructions (Addendum)
 Continue Albuterol  inhaler 2 puffs or 3 mL neb every 6 hours as needed for shortness of breath or wheezing. Notify if symptoms persist despite rescue inhaler/neb use. Use nebs 2-3 times a day until symptoms improve then as needed  Continue Breztri  2 puffs Twice daily. Brush tongue and rinse mouth afterwards Continue dupixent  injections every 2 weeks Continue supplemental oxygen  2  lpm as needed with activity and at night for goal >88-90% Continue flonase  nasal spray 2 sprays each nostril daily for nasal congestion/drainage    Restart Guaifenesin  or guaifenesin  DM (Mucinex  or Mucinex  DM; store brand version is fine) over the counter for cough/congestion Prednisone  taper. 4 tabs for 3 days, then 3 tabs for 3 days, 2 tabs for 3 days, then 1 tab for 3 days, then stop. Take in AM with food   Start ohtuvayre  2.5 mL neb Twice daily once able to get patient assistance. This is a new maintenance treatment that you will use regardless of how you feel. Notify and stop of any mood changes Call the [facility] once you've hit your $2000 deductible: [phone]    Follow up in 3-4 weeks with Dr. Meade or Izetta Semira Stoltzfus,NP. If symptoms do not improve or worsen, please contact office for sooner follow up or seek emergency care.

## 2024-02-21 NOTE — Telephone Encounter (Signed)
 Appointment made for today 02/21/2024 with Comer Rouleau NP at 10:00am  FYI Only or Action Required?: FYI only for provider.  Patient is followed in Pulmonology for COPD, last seen on 01/25/2024 by Rouleau Comer GAILS, NP.  Called Nurse Triage reporting Cough and Shortness of Breath.  Symptoms began 3 days ago.  Interventions attempted: Prescription medications: Albuterol ,. Breztri , Rescue inhaler, Maintenance inhaler, Nebulizer treatments, and Home oxygen  use.  Symptoms are: gradually worsening.  Triage Disposition: See HCP Within 4 Hours (Or PCP Triage)  Patient/caregiver understands and will follow disposition?: Yes           Copied from CRM #[id number]. Topic: Clinical - Red Word Triage >> Feb 21, 2024  8:07 AM Russell PARAS wrote: Red Word that prompted transfer to Nurse Triage:   Seen by Cobb on 9/16 for bronchitis, believes symptoms are returning. SOB and wheezing, more than normal Phlegm is clear Productive cough  Pt of Dr. Rouleau Reason for Disposition . [1] MILD difficulty breathing (e.g., minimal/no SOB at rest, SOB with walking, pulse < 100) AND [2] still present when not coughing  Answer Assessment - Initial Assessment Questions Patient seen a month ago for bronchitis Got a little better then started feeling bad again about 3 days ago  E2C2 Pulmonary Triage - Initial Assessment Questions  Have you tested for COVID or Flu? Note: If not, ask patient if a home test can be taken. If so, instruct patient to call back for positive results. No  MEDICINES:   Have you used any OTC meds to help with symptoms? No If yes, ask What medications? N/a  Have you used your inhalers/maintenance medication? Yes If yes, What medications? Albuterol  Neb--Take 3 mLs (1.25 mg total) by nebulization every 4 (four) hours as needed for wheezing or shortness of breath.  Albuterol  inhaler--INHALE 2 PUFFS EVERY 6 HOURS AS NEEDED FOR WHEEZE OR SHORTNESS OF BREATH  Breztri --Inhale  2 puffs into the lungs in the morning and at bedtime.   If inhaler, ask How many puffs and how often? Note: Review instructions on medication in the chart. Albuterol  Neb--Take 3 mLs (1.25 mg total) by nebulization every 4 (four) hours as needed for wheezing or shortness of breath.  Albuterol  inhaler--INHALE 2 PUFFS EVERY 6 HOURS AS NEEDED FOR WHEEZE OR SHORTNESS OF BREATH  Breztri --Inhale 2 puffs into the lungs in the morning and at bedtime.   OXYGEN : Do you wear supplemental oxygen ? Yes If yes, How many liters are you supposed to use? 3 liters --- patient states he used to wear it just to sleep and when he started feeling bad three weeks ago he started wearing it more  Do you monitor your oxygen  levels? Yes If yes, What is your reading (oxygen  level) today? 98% on oxygen   ---patient states that if he takes his oxygen  off he will be around 85%  What is your usual oxygen  saturation reading?  (Note: Pulmonary O2 sats should be 90% or greater) ------   1. ONSET: When did the cough begin?      3 days ago 2. SEVERITY: How bad is the cough today?      ---- 3. SPUTUM: Describe the color of your sputum (e.g., none, dry cough; clear, white, yellow, green)     clear 4. HEMOPTYSIS: Are you coughing up any blood? If Yes, ask: How much? (e.g., flecks, streaks, tablespoons, etc.)     no 5. DIFFICULTY BREATHING: Are you having difficulty breathing? If Yes, ask: How bad is it? (e.g., mild, moderate,  severe)      A little worse than normal 6. FEVER: Do you have a fever? If Yes, ask: What is your temperature, how was it measured, and when did it start?     no 7. CARDIAC HISTORY: Do you have any history of heart disease? (e.g., heart attack, congestive heart failure)      -------- 8. LUNG HISTORY: Do you have any history of lung disease?  (e.g., pulmonary embolus, asthma, emphysema)     COPD 9. PE RISK FACTORS: Do you have a history of blood clots? (or: recent  major surgery, recent prolonged travel, bedridden)     No 10. OTHER SYMPTOMS: Do you have any other symptoms? (e.g., runny nose, wheezing, chest pain)       Productive cough, shortness of breath worse than normal  Protocols used: Cough - Acute Productive-A-AH

## 2024-02-21 NOTE — Progress Notes (Signed)
 @Patient  ID: Mario Proctor, male    DOB: 1940-11-27, 83 y.o.   MRN: [id number]  Chief Complaint  Patient presents with   Acute Visit    Productive cough with clear phlegm and increased chest congestion and SOB over the last 3-4 days. Cough gets worse at night     Referring provider: Chandra Toribio POUR, MD  HPI: 83 year old male, former smoker (57 pack year history) followed for severe asthma/COPD on biologic. He is a patient of Dr. Correne and last seen in office 01/25/2024 by [facility] NP. Past medical history significant for a fib, cardiomyopathy, chronic combined systolic and diastolic HF. He is also followed by the lung cancer screening program.  TEST/EVENTS:  01/07/2017 PFTs: FVC 3.96 (89%), FEV1 1.86 (58), ratio 47, TLC 113%, DLCO uncorrected 17.71 (52%).  Moderate obstructive airways disease 10/14/2020 CT chest without contrast: Atherosclerosis.  Scattered small mediastinal lymph nodes evident.  Centrilobular and paraseptal emphysema.  Previously identified nodular area in RLL resolved; some very subtle groundglass attenuation.  No new suspicious pulmonary nodule or mass.  Tiny nodule left apex unchanged.  Stable 2 cm exophytic lesion upper pole right kidney.  Recommended return for lung cancer screening. 03/15/2020 echocardiogram: EF 20 to 25%.  LV moderately dilated.  Dilated cardiomyopathy.  Mild LVH.  G1 DD.  Mild to moderate mitral regurgitation.  Mild to moderate tricuspid regurgitation.  Moderate pulmonary hypertension.  IVC dilated with poor inspiration collapse consistent with elevated right atrial pressure. 11/06/2020 echocardiogram: LVEF 30 to 35%.  Severe global hypokinesis.  G1 DD.  LA severely dilated.  Mild mitral regurgitation.  Mild tricuspid regurgitation.  Improvement in PASP from 44 to 20 mmHg.  05/03/2019 CT chest without contrast: Atherosclerosis.  Small mediastinal lymph nodes, including a 9 mm short axis AP window node, likely reactive.  2.7 x 4.2 cm irregular/spiculated  masslike opacity in the posterior RLL.  Mild associated hyperdensity within the lesion.  Adjacent debris's within RLL bronchi.  1.4 x 2.6 cm irregular nodular opacity in the posterior RUL and 7x12 mm irregular nodular opacity in the posterior LUL. Additional mild irregular linear/nodular opacity in RUL. Moderate centrilobular and paraseptal emphysematous changes. Mild superior endplate compression fx at L1. 05/04/2021 echocardiogram: LVEF 45 to 50%.  Mild LVH.  Diastolic parameters normal.  Trivial MVR.  No evidence of pulmonary hypertension 04/20/2022 CT chest: atherosclerosis. Severe emphysema. Changes of chronic bronchitis. Indistinct 4 mm LUL nodule, stable and considered benign. 2.1 cm ground glass LUL resolved. Simple kidney cyst. Chronic mild T12 fx.  01/11/2024 CXR: increased chronic interstitial changes with possible central peribronchial thickening suggestive of bronchitis   04/13/2023: OV with Dr. Meade. Former pt of Dr. Gladis. Here to establish. Been on Dupixent  since October. Doing well on this and Breztri . Last prednisone  August 2024. Independent with ADLs. Using albuterol  2-3 x/day. Still smoking 3-4/day. Completed pulm rehab 2023.   01/11/2024: SHERLEAN with Mario Nissan NP Tarry Fountain is an 83 year old male who presents with increased shortness of breath. His wife is with him and helps tor provide history.  He has experienced increased shortness of breath over the past couple of weeks. Symptoms started with a cough and a lot of mucus. He still has a cough with some phlegm, which is less severe than before, but he has been experiencing postnasal drainage causing him to feel 'choked up' during sleep the last two nights. He has not been using saline rinses or nasal sprays. He's not sure what color the  mucus is.  He uses his albuterol  rescue inhaler more frequently, multiple times a day. He also uses nebulizer treatments twice daily, consistent with his usual routine. No missed doses of Xarelto  have  occurred, and he had his last Dupixent  injection last Wednesday. He is awaiting the next shipment of Dupixent  due to pending paperwork. No fever, hemoptysis, or significant changes in sputum color are present. The patient reports that wheezing is noticed when he gets up and moves. He has not used antibiotics recently. No sore throat, ear pain, facial tenderness, CP, leg swelling  01/25/2024: OV with [place] Pechacek NP. Mario Proctor is an 83 year old male with severe lung disease who presents for follow-up after flare up.  He has experienced improvement in his breathing and a reduction in coughing since his last visit two weeks ago, although he does not feel completely back to his normal state. He was previously treated for bronchitis with z pack and prednisone .  He is currently off prednisone . He is on Dupixent  for maintenance therapy and uses Breztri  as a maintenance therapy and albuterol  for emergency purposes. No increased oxygen  requirements.  He does not recall the color of his productive cough. No significant amount of phlegm to spit out and did not feel worse after stopping prednisone . No fevers, hemoptysis, wheezing, chest congestion.   02/21/2024: Today - acute Discussed the use of AI scribe software for clinical note transcription with the patient, who gave verbal consent to proceed.  History of Present Illness Mario Proctor is an 83 year old male with asthma and COPD who presents with increased cough and chest congestion.  He has experienced an increased cough and chest congestion for the past three to four days. Breathing feels a little more short. The cough is productive with clear sputum, and he has rhinorrhea with clear drainage. No headaches, fever, or hemoptysis are present. No increased wheezing that they've noticed. No leg swelling, CP, weight gain. No known sick exposures.   He is currently using Breztri  twice a day and has been using his nebulizer once a day. He  previously used Mucinex  but has not continued it after finishing the last course. He is also using Flonase  nasal spray.  No increased oxygen  requirements.   He has not yet obtained the new nebulized medication, Ohtuvayre , due to cost issues, as it requires a $2,000 copay which he has not yet met. He thinks he is only about $100 away from this.     No Known Allergies  Immunization History  Administered Date(s) Administered   Fluad Quad(high Dose 65+) 02/23/2019, 02/08/2021   INFLUENZA, HIGH DOSE SEASONAL PF 02/15/2016, 04/16/2017, 04/04/2018, 03/11/2020, 01/19/2023   Influenza-Unspecified 03/11/2022   PFIZER(Purple Top)SARS-COV-2 Vaccination 06/15/2019, 07/10/2019, 04/02/2020   Pneumococcal-Unspecified 10/16/2010   Tdap 08/29/2020    Past Medical History:  Diagnosis Date   A-fib [facility])    Allergy    CHF (congestive heart failure) (HCC)    Dupuytren contracture    right sm finger   Myocardial infarct (HCC) 2019   Myocardial infarction [facility])    Small bowel obstruction (HCC)    Stage 4 very severe COPD by GOLD classification (HCC) 05/14/2020    Tobacco History: Social History   Tobacco Use  Smoking Status Some Days   Current packs/day: 2.00   Average packs/day: 2.0 packs/day for 50.0 years (100.0 ttl pk-yrs)   Types: Cigarettes   Passive exposure: Current  Smokeless Tobacco Never  Tobacco Comments   Sneaks a cigarette once  in a while Updated 01/25/24   Ready to quit: Not Answered Counseling given: Not Answered Tobacco comments: Sneaks a cigarette once in a while Updated 01/25/24   Outpatient Medications Prior to Visit  Medication Sig Dispense Refill   albuterol  (ACCUNEB ) 1.25 MG/3ML nebulizer solution Take 3 mLs (1.25 mg total) by nebulization every 4 (four) hours as needed for wheezing or shortness of breath. 75 mL 5   albuterol  (VENTOLIN  HFA) 108 (90 Base) MCG/ACT inhaler INHALE 2 PUFFS EVERY 6 HOURS AS NEEDED FOR WHEEZE OR SHORTNESS OF BREATH 18 each 11    atorvastatin  (LIPITOR) 10 MG tablet Take 1 tablet (10 mg total) by mouth daily. 90 tablet 3   Budeson-Glycopyrrol-Formoterol  (BREZTRI  AEROSPHERE) 160-9-4.8 MCG/ACT AERO Inhale 2 puffs into the lungs in the morning and at bedtime. 10.7 g 5   clobetasol  cream (TEMOVATE ) 0.05 % Apply 1 Application topically 2 (two) times daily as needed. 80 g 3   Dupilumab  (DUPIXENT ) 300 MG/2ML SOAJ Inject 300 mg into the skin every 14 (fourteen) days. 12 mL 1   fluticasone  (FLONASE ) 50 MCG/ACT nasal spray Place 2 sprays into both nostrils daily. 18.2 mL 2   hydrOXYzine  (VISTARIL ) 25 MG capsule TAKE 1 CAPSULE BY MOUTH AT BEDTIME AS NEEDED FOR ITCHING 90 capsule 1   lisinopril  (ZESTRIL ) 10 MG tablet TAKE 1 TABLET BY MOUTH EVERYDAY AT BEDTIME 90 tablet 3   metoprolol  succinate (TOPROL -XL) 25 MG 24 hr tablet Take 1 tablet (25 mg total) by mouth daily. 90 tablet 3   XARELTO  20 MG TABS tablet TAKE 1 TABLET BY MOUTH DAILY WITH SUPPER. 90 tablet 3   amLODipine  (NORVASC ) 5 MG tablet Take 1 tablet (5 mg total) by mouth daily. (Patient not taking: Reported on 02/21/2024) 90 tablet 3   azithromycin  (ZITHROMAX ) 250 MG tablet Take 2 tablets on day one then take 1 tablet daily for four additional days (Patient not taking: Reported on 02/21/2024) 6 tablet 0   predniSONE  (DELTASONE ) 10 MG tablet 4 tabs for 2 days, then 3 tabs for 2 days, 2 tabs for 2 days, then 1 tab for 2 days, then stop (Patient not taking: Reported on 02/21/2024) 20 tablet 0   No facility-administered medications prior to visit.     Review of Systems: As above    Physical Exam:  BP 128/76   Pulse (!) 55   Temp 97.6 F (36.4 C)   Ht 5' 11 (1.803 m)   Wt 165 lb 6.4 oz (75 kg)   SpO2 93% Comment: 2L cont. o2  BMI 23.07 kg/m   GEN: Pleasant, interactive, chronically-ill appearing; in no acute distress. HEENT:  Normocephalic and atraumatic.  PERRLA. Sclera white. Nasal turbinates pink, moist and patent bilaterally. No rhinorrhea present. Oropharynx  pink and moist, without exudate or edema. No lesions, ulcerations, or postnasal drip.  NECK:  Supple w/ fair ROM. No JVD present. No lymphadenopathy.   CV: Irregular rhythm, rate controlled, no m/r/g, no peripheral edema. Pulses intact, +2 bilaterally. No cyanosis, pallor or clubbing. PULMONARY:  Unlabored, regular breathing. Decreased airflow b/l with mild wheeze b/l lower lobes. No accessory muscle use. No dullness to percussion.  GI: BS present and normoactive. Soft, non-tender to palpation.  MSK: No erythema, warmth or tenderness. Cap refil <2 sec all extrem. Muscle wasting  Neuro: A/Ox3. No focal deficits noted.   Skin: Warm, no lesions or rashe Psych: Normal affect and behavior. Judgement and thought content appropriate.     Lab Results:  CBC    Component  Value Date/Time   WBC 9.9 11/02/2023 1017   WBC 7.6 04/24/2022 1002   RBC 4.28 11/02/2023 1017   RBC 4.69 04/24/2022 1002   HGB 13.0 11/02/2023 1017   HCT 39.9 11/02/2023 1017   PLT 280 11/02/2023 1017   MCV 93 11/02/2023 1017   MCH 30.4 11/02/2023 1017   MCH 31.4 05/06/2021 0607   MCHC 32.6 11/02/2023 1017   MCHC 33.0 04/24/2022 1002   RDW 12.1 11/02/2023 1017   LYMPHSABS 2.5 11/02/2023 1017   MONOABS 0.7 04/24/2022 1002   EOSABS 0.6 (H) 11/02/2023 1017   BASOSABS 0.1 11/02/2023 1017    BMET    Component Value Date/Time   NA 138 11/02/2023 1017   K 5.6 (H) 11/02/2023 1017   CL 100 11/02/2023 1017   CO2 24 11/02/2023 1017   GLUCOSE 74 11/02/2023 1017   GLUCOSE 95 01/23/2022 1039   BUN 29 (H) 11/02/2023 1017   CREATININE 1.85 (H) 11/02/2023 1017   CALCIUM  8.8 11/02/2023 1017   GFRNONAA 52 (L) 05/06/2021 0607   GFRAA 45 (L) 07/04/2020 0920    BNP    Component Value Date/Time   BNP 292.4 (H) 05/06/2021 9392     Imaging:  No results found.   methylPREDNISolone  acetate (DEPO-MEDROL ) injection 80 mg     Date Action Dose Route User   01/11/2024 0926 Given 80 mg Intramuscular (Right Upper Outer  Quadrant) Armand Burnard SAUNDERS, CMA          Latest Ref Rng & Units 09/14/2022    9:47 AM 01/07/2017   12:37 PM  PFT Results  FVC-Pre L 2.71  3.63   FVC-Predicted Pre % 67  82   FVC-Post L 2.88  3.96   FVC-Predicted Post % 71  89   Pre FEV1/FVC % % 38  48   Post FEV1/FCV % % 37  47   FEV1-Pre L 1.02  1.74   FEV1-Predicted Pre % 35  54   FEV1-Post L 1.07  1.86   DLCO uncorrected ml/min/mmHg 6.56  15.81   DLCO UNC% % 26  46   DLCO corrected ml/min/mmHg 6.56  17.71   DLCO COR %Predicted % 26  52   DLVA Predicted % 29  55   TLC L 10.18  8.24   TLC % Predicted % 144  113   RV % Predicted % 242  152     No results found for: NITRICOXIDE      Assessment & Plan:   Assessment & Plan  Chronic obstructive pulmonary disease/asthma with acute exacerbation COPD exacerbation with increased cough and chest congestion for 3-4 days. No fever, hemoptysis, or increased oxygen  needs. No infectious symptoms, increased consistency of sputum or purulent phlegm so will hold off on further abx at this time. If no improvement, will initiate abx therapy and obtain repeat imaging. COVID test negative. Continue inhaler regimen. Action plan in place.  - Increase nebulizer treatments to 2-3 times a day until symptoms improve - Start steroid taper  - Monitor for changes in sputum color and consistency; if it becomes thick and discolored, consider antibiotics - Continue Breztri  twice daily - Continue Flonase  nasal spray - Restart Mucinex  over-the-counter - Continue prn albuterol   - Continue dupxient injections - Start Ohtuvayre  once deductible is met   Chronic respiratory failure requiring supplemental oxygen  Chronic respiratory failure managed with supplemental oxygen . No increased requirement. Goal >88-90% - Continue supplemental oxygen  2-3 lpm   Allergic rhinitis Allergic rhinitis managed with Flonase  nasal spray.  Symptoms include mild sinus drainage and runny nose. - Continue Flonase   nasal spray - Guaifenesin  OTC   I spent 35 minutes of dedicated to the care of this patient on the date of this encounter to include pre-visit review of records, face-to-face time with the patient discussing conditions above, post visit ordering of testing, clinical documentation with the electronic health record, making appropriate referrals as documented, and communicating necessary findings to members of the patients care team.   Comer LULLA Rouleau, NP 02/21/2024  Pt aware and understands NP's role.

## 2024-02-26 ENCOUNTER — Telehealth: Payer: Self-pay | Admitting: Pulmonary Disease

## 2024-02-26 DIAGNOSIS — J441 Chronic obstructive pulmonary disease with (acute) exacerbation: Secondary | ICD-10-CM

## 2024-02-26 MED ORDER — PREDNISONE 10 MG PO TABS: 10.0000 mg | ORAL_TABLET | Freq: Every day | ORAL | 0 refills | Status: DC

## 2024-02-26 NOTE — Telephone Encounter (Signed)
 Weekend Call Note:  Prednisone  prescription did not have enough tablets for taper. Script for 10mg  tablets x 10 sent to [facility] in [place], [place] as patient was out of town.  Dorn Chill, MD [facility] Office: [phone]   See Amion for personal pager PCCM on call pager [phone] until 7pm. Please call Elink 7p-7a. [phone]

## 2024-02-28 ENCOUNTER — Ambulatory Visit: Admitting: Family Medicine

## 2024-03-06 ENCOUNTER — Telehealth: Payer: Self-pay | Admitting: *Deleted

## 2024-03-06 ENCOUNTER — Other Ambulatory Visit: Payer: Self-pay | Admitting: Pulmonary Disease

## 2024-03-06 ENCOUNTER — Other Ambulatory Visit: Payer: Self-pay | Admitting: *Deleted

## 2024-03-06 MED ORDER — BREZTRI AEROSPHERE 160-9-4.8 MCG/ACT IN AERO: 2.0000 | INHALATION_SPRAY | Freq: Two times a day (BID) | RESPIRATORY_TRACT | 4 refills | Status: DC

## 2024-03-06 NOTE — Telephone Encounter (Signed)
 Received a fax from [facility] requesting a new prescription for Breztri .  Script for Breztri  printed off and had Almarie Ferrari NP sign it as Dr. Meade is out of the office until November.  Form signed and faxed to [facility] [phone].  Received fax confirmation that it was faxed successfully.  Nothing further needed.

## 2024-03-09 ENCOUNTER — Ambulatory Visit (INDEPENDENT_AMBULATORY_CARE_PROVIDER_SITE_OTHER): Admitting: Family Medicine

## 2024-03-09 ENCOUNTER — Encounter: Payer: Self-pay | Admitting: Family Medicine

## 2024-03-09 ENCOUNTER — Other Ambulatory Visit: Payer: Self-pay | Admitting: Cardiology

## 2024-03-09 VITALS — BP 125/75 | HR 68 | Ht 71.0 in | Wt 166.1 lb

## 2024-03-09 DIAGNOSIS — N1832 Chronic kidney disease, stage 3b: Secondary | ICD-10-CM | POA: Insufficient documentation

## 2024-03-09 DIAGNOSIS — L308 Other specified dermatitis: Secondary | ICD-10-CM | POA: Diagnosis not present

## 2024-03-09 DIAGNOSIS — I10 Essential (primary) hypertension: Secondary | ICD-10-CM | POA: Diagnosis not present

## 2024-03-09 DIAGNOSIS — E782 Mixed hyperlipidemia: Secondary | ICD-10-CM

## 2024-03-09 DIAGNOSIS — E785 Hyperlipidemia, unspecified: Secondary | ICD-10-CM | POA: Insufficient documentation

## 2024-03-09 DIAGNOSIS — J449 Chronic obstructive pulmonary disease, unspecified: Secondary | ICD-10-CM

## 2024-03-09 DIAGNOSIS — Z23 Encounter for immunization: Secondary | ICD-10-CM | POA: Diagnosis not present

## 2024-03-09 DIAGNOSIS — I5020 Unspecified systolic (congestive) heart failure: Secondary | ICD-10-CM

## 2024-03-09 DIAGNOSIS — I4819 Other persistent atrial fibrillation: Secondary | ICD-10-CM

## 2024-03-09 DIAGNOSIS — G629 Polyneuropathy, unspecified: Secondary | ICD-10-CM | POA: Diagnosis not present

## 2024-03-09 NOTE — Assessment & Plan Note (Signed)
-   Reports taking hydroxyzine  once daily with improvement in symptoms. - Plan to wean off hydroxyzine  to assess for continued need.

## 2024-03-09 NOTE — Assessment & Plan Note (Signed)
 Last gfr in June was 36.  Will recheck today.  May restart acei/arb in place of amlodipine  pending lab results

## 2024-03-09 NOTE — Assessment & Plan Note (Signed)
-   Reports new tingling sensation in all toes. Denies numbness. Not bothersome at this time. - Will order labs to investigate underlying causes, including B12, CBC, CMP. - If labs are negative, will treat symptoms if they become bothersome. Discussed non-pharmacological options including topical analgesics (e.g., Icy Hot) and a vibrating foot plate. - Discussed pharmacological options including gabapentin or pregabalin, which can be initiated if symptoms worsen and other measures are ineffective. Deferred for now as symptoms are not bothersome.

## 2024-03-09 NOTE — Patient Instructions (Addendum)
 It was nice to see you today,  We addressed the following topics today: - Try taking your hydroxyzine  every other day for a couple of weeks, then try stopping it. If the itching comes back, you can restart it for now. - If the tingling in your feet starts to bother you, you can try over-the-counter topical creams like Icy Hot or a vibrating foot plate which you can buy online. We can discuss prescription medication if these do not help. - your blood pressure was better.  We will not change your medications at this time - Please have your wife check her records to see if you are supposed to be taking lisinopril  or amlodipine . I will also call the pharmacy. - I will see you back in six months, but please wait here until we check your blood pressure again.  Have a great day,  Rolan Slain, MD

## 2024-03-09 NOTE — Progress Notes (Signed)
 Established Patient Office Visit  Subjective   Patient ID: Mario Proctor, male    DOB: [birth year]  Age: 83 y.o. MRN: [id number]  Chief Complaint  Patient presents with   Medical Management of Chronic Issues    HPI  Subjective - Follow-up for blood pressure and cholesterol. - New complaint of tingling in all ten toes. Onset is unclear (a while). Denies numbness. Reports it does not bother him. - Reports recent history of bronchitis, now resolved. Saw pulmonologist this month.  Medications Current medications discussed include Breztri  daily inhaler, albuterol  inhaler (used 3-4 times today), Dupixent  injection, Flonase  nasal spray (recently started), Vistaril  (hydroxyzine ) 1x/day for itching, metoprolol , Lipitor, and Xarelto . Reports using prednisone  for a recent flare. History of taking amlodipine  and amiodarone . Believes he was taken off amlodipine  and put on metoprolol . Last filled lisinopril  in 2024, likely not taking. Uses oxygen  at home, mostly at night but more during the day recently with exertion since his COPD exacerbation.  PMH, PSH, FH, Social Hx PMHx: Hypertension, hyperlipidemia, peripheral neuropathy, bronchitis (resolved), COPD, history of itching. Followed by pulmonology (Dr. Ladona, last seen several months ago, f/u in January). Wife helps manage medications.  ROS Neurologic: Positive for tingling in toes. Denies numbness. Respiratory: Reports using rescue inhaler 3-4 times today. Uses home O2 with exertion and at night. Dermatologic: Reports history of itching, which is now less severe. Takes hydroxyzine  for this.   The ASCVD Risk score (Arnett DK, et al., 2019) failed to calculate for the following reasons:   The 2019 ASCVD risk score is only valid for ages 63 to 25   Risk score cannot be calculated because patient has a medical history suggesting prior/existing ASCVD  Health Maintenance Due  Topic Date Due   Pneumococcal Vaccine: 50+ Years (1 of 2 - PCV)  [birth year]/06/1959   Zoster Vaccines- Shingrix (1 of 2) Never done   Medicare Annual Wellness (AWV)  11/03/2023   COVID-19 Vaccine (4 - 2025-26 season) 01/10/2024      Objective:     BP 125/75   Pulse 68   Ht 5' 11 (1.803 m)   Wt 166 lb 1.9 oz (75.4 kg)   SpO2 93%   BMI 23.17 kg/m    Physical Exam Gen: alert, oriented Pulm: no respiratory distress.  Expiratory wheeze b/l on exam.   Psych: pleasant affect   No results found for any visits on 03/09/24.      Assessment & Plan:   Neuropathy Assessment & Plan: - Reports new tingling sensation in all toes. Denies numbness. Not bothersome at this time. - Will order labs to investigate underlying causes, including B12, CBC, CMP. - If labs are negative, will treat symptoms if they become bothersome. Discussed non-pharmacological options including topical analgesics (e.g., Icy Hot) and a vibrating foot plate. - Discussed pharmacological options including gabapentin or pregabalin, which can be initiated if symptoms worsen and other measures are ineffective. Deferred for now as symptoms are not bothersome.  Orders: -     CBC with Differential/Platelet -     Iron, TIBC and Ferritin Panel -     Protein electrophoresis, serum -     TSH -     Comprehensive metabolic panel with GFR -     Hemoglobin A1c -     B12 and Folate Panel  Persistent atrial fibrillation (HCC) -     CBC with Differential/Platelet -     Iron, TIBC and Ferritin Panel  HFrEF (heart failure with reduced ejection fraction) (HCC) -  Brain natriuretic peptide  Mixed hyperlipidemia Assessment & Plan: - Last LDL was 70 in February of last year. - Continue Lipitor. - Will recheck lipid panel with other labs.  Orders: -     Lipid panel  Stage 4 very severe COPD by GOLD classification [facility]) Assessment & Plan: - Seen by pulmonology this month for bronchitis. Uses Breztri , Dupixent , and albuterol  as needed. Increased daytime O2 use with exertion since recent  exacerbation. - Continue current regimen. - Follow up in 6 months.   Pruritic dermatitis Assessment & Plan: - Reports taking hydroxyzine  once daily with improvement in symptoms. - Plan to wean off hydroxyzine  to assess for continued need.   Primary hypertension Assessment & Plan: - Unclear medication regimen. May have stopped amlodipine  and/or lisinopril . Currently on metoprolol .  - repeat bp was in normal range.  No changes to medications made today.  - [facility] [place] and [facility] show amlodipine  90 day rx was filled on 8/4 and lsinopril 90 day rx was last filled on 4/25.  Appears he is not on lisinopril  but may be still taking amlodipine .    Chronic kidney disease, stage 3b [facility]) Assessment & Plan: Last gfr in June was 36.  Will recheck today.  May restart acei/arb in place of amlodipine  pending lab results   Other orders -     Flu vaccine HIGH DOSE PF(Fluzone Trivalent)   I spent 35 min in the mgmt of this patient.   Return in about 6 months (around 09/07/2024) for HTN, hld.    Toribio MARLA Slain, MD

## 2024-03-09 NOTE — Assessment & Plan Note (Signed)
-   Last LDL was 70 in February of last year. - Continue Lipitor. - Will recheck lipid panel with other labs.

## 2024-03-09 NOTE — Assessment & Plan Note (Signed)
-   Seen by pulmonology this month for bronchitis. Uses Breztri , Dupixent , and albuterol  as needed. Increased daytime O2 use with exertion since recent exacerbation. - Continue current regimen. - Follow up in 6 months.

## 2024-03-09 NOTE — Assessment & Plan Note (Addendum)
-   Unclear medication regimen. May have stopped amlodipine  and/or lisinopril . Currently on metoprolol .  - repeat bp was in normal range.  No changes to medications made today.  - [facility] [place] and [facility] show amlodipine  90 day rx was filled on 8/4 and lsinopril 90 day rx was last filled on 4/25.  Appears he is not on lisinopril  but may be still taking amlodipine .

## 2024-03-10 ENCOUNTER — Ambulatory Visit: Payer: Self-pay | Admitting: Family Medicine

## 2024-03-13 LAB — PROTEIN ELECTROPHORESIS, SERUM
A/G Ratio: 1.1 (ref 0.7–1.7)
Albumin ELP: 3.6 g/dL (ref 2.9–4.4)
Alpha 1: 0.3 g/dL (ref 0.0–0.4)
Alpha 2: 0.7 g/dL (ref 0.4–1.0)
Beta: 1.3 g/dL (ref 0.7–1.3)
Gamma Globulin: 1.2 g/dL (ref 0.4–1.8)
Globulin, Total: 3.4 g/dL (ref 2.2–3.9)

## 2024-03-13 LAB — B12 AND FOLATE PANEL
Folate: 10.2 ng/mL (ref >3.0)
Vitamin B-12: 333 pg/mL (ref 232–1245)

## 2024-03-13 LAB — COMPREHENSIVE METABOLIC PANEL WITH GFR
ALT: 13 IU/L (ref 0–44)
AST: 17 IU/L (ref 0–40)
Albumin: 4 g/dL (ref 3.7–4.7)
Alkaline Phosphatase: 105 IU/L (ref 48–129)
BUN/Creatinine Ratio: 16 (ref 10–24)
BUN: 18 mg/dL (ref 8–27)
Bilirubin Total: 0.6 mg/dL (ref 0.0–1.2)
CO2: 30 mmol/L — ABNORMAL HIGH (ref 20–29)
Calcium: 9 mg/dL (ref 8.6–10.2)
Chloride: 97 mmol/L (ref 96–106)
Creatinine, Ser: 1.11 mg/dL (ref 0.76–1.27)
Globulin, Total: 3 g/dL (ref 1.5–4.5)
Glucose: 83 mg/dL (ref 70–99)
Potassium: 4.8 mmol/L (ref 3.5–5.2)
Sodium: 141 mmol/L (ref 134–144)
Total Protein: 7 g/dL (ref 6.0–8.5)
eGFR: 66 mL/min/1.73 (ref >59)

## 2024-03-13 LAB — BRAIN NATRIURETIC PEPTIDE: BNP: 428.6 pg/mL — AB (ref 0.0–100.0)

## 2024-03-13 LAB — CBC WITH DIFFERENTIAL/PLATELET
Basophils Absolute: 0.1 x10E3/uL (ref 0.0–0.2)
Basos: 1 %
EOS (ABSOLUTE): 0.4 x10E3/uL (ref 0.0–0.4)
Eos: 4 %
Hematocrit: 45.3 % (ref 37.5–51.0)
Hemoglobin: 14 g/dL (ref 13.0–17.7)
Immature Grans (Abs): 0.1 x10E3/uL (ref 0.0–0.1)
Immature Granulocytes: 1 %
Lymphocytes Absolute: 2.1 x10E3/uL (ref 0.7–3.1)
Lymphs: 22 %
MCH: 29.5 pg (ref 26.6–33.0)
MCHC: 30.9 g/dL — ABNORMAL LOW (ref 31.5–35.7)
MCV: 95 fL (ref 79–97)
Monocytes Absolute: 0.9 x10E3/uL (ref 0.1–0.9)
Monocytes: 9 %
Neutrophils Absolute: 6.1 x10E3/uL (ref 1.4–7.0)
Neutrophils: 63 %
Platelets: 266 x10E3/uL (ref 150–450)
RBC: 4.75 x10E6/uL (ref 4.14–5.80)
RDW: 11.7 % (ref 11.6–15.4)
WBC: 9.5 x10E3/uL (ref 3.4–10.8)

## 2024-03-13 LAB — LIPID PANEL
Chol/HDL Ratio: 2.3 ratio (ref 0.0–5.0)
Cholesterol, Total: 134 mg/dL (ref 100–199)
HDL: 59 mg/dL (ref >39)
LDL Chol Calc (NIH): 61 mg/dL (ref 0–99)
Triglycerides: 69 mg/dL (ref 0–149)
VLDL Cholesterol Cal: 14 mg/dL (ref 5–40)

## 2024-03-13 LAB — IRON,TIBC AND FERRITIN PANEL
Ferritin: 63 ng/mL (ref 30–400)
Iron Saturation: 22 % (ref 15–55)
Iron: 78 ug/dL (ref 38–169)
Total Iron Binding Capacity: 354 ug/dL (ref 250–450)
UIBC: 276 ug/dL (ref 111–343)

## 2024-03-13 LAB — HEMOGLOBIN A1C
Est. average glucose Bld gHb Est-mCnc: 123 mg/dL
Hgb A1c MFr Bld: 5.9 % — ABNORMAL HIGH (ref 4.8–5.6)

## 2024-03-13 LAB — TSH: TSH: 1.51 u[IU]/mL (ref 0.450–4.500)

## 2024-03-21 ENCOUNTER — Encounter: Payer: Self-pay | Admitting: Nurse Practitioner

## 2024-03-21 ENCOUNTER — Ambulatory Visit: Admitting: Nurse Practitioner

## 2024-03-21 VITALS — BP 148/76 | HR 55 | Temp 97.4°F | Ht 71.0 in | Wt 166.6 lb

## 2024-03-21 DIAGNOSIS — J449 Chronic obstructive pulmonary disease, unspecified: Secondary | ICD-10-CM

## 2024-03-21 DIAGNOSIS — J310 Chronic rhinitis: Secondary | ICD-10-CM

## 2024-03-21 DIAGNOSIS — J9611 Chronic respiratory failure with hypoxia: Secondary | ICD-10-CM | POA: Diagnosis not present

## 2024-03-21 DIAGNOSIS — J309 Allergic rhinitis, unspecified: Secondary | ICD-10-CM | POA: Diagnosis not present

## 2024-03-21 MED ORDER — IPRATROPIUM BROMIDE 0.06 % NA SOLN
2.0000 | Freq: Three times a day (TID) | NASAL | 5 refills | Status: DC | PRN
Start: 2024-03-21 — End: 2024-03-27

## 2024-03-21 NOTE — Patient Instructions (Addendum)
 Continue Albuterol  inhaler 2 puffs or 3 mL neb every 6 hours as needed for shortness of breath or wheezing. Notify if symptoms persist despite rescue inhaler/neb use.  Continue Breztri  2 puffs Twice daily. Brush tongue and rinse mouth afterwards Continue dupixent  injections every 2 weeks Continue supplemental oxygen  2  lpm as needed with activity and at night for goal >88-90% Continue flonase  nasal spray 2 sprays each nostril daily for nasal congestion/drainage  Continue Guaifenesin  or guaifenesin  DM (Mucinex  or Mucinex  DM; store brand version is fine) over the counter for cough/congestion   Start ohtuvayre  2.5 mL neb Twice daily once able to get patient assistance. This is a new maintenance treatment that you will use regardless of how you feel. Notify and stop of any mood changes Call the [facility] once you've hit your $2000 deductible: [phone]   Ipratropium nasal spray Three times a day as needed for nasal congestion/drainage  Try a wedge pillow at night to help elevate the head of your bed    Follow up in 6-8 weeks with Dr. Meade or Izetta Jaklyn Alen,NP. If symptoms do not improve or worsen, please contact office for sooner follow up or seek emergency care.

## 2024-03-21 NOTE — Progress Notes (Signed)
 @Patient  ID: Mario Proctor, male    DOB: 09/12/1940, 83 y.o.   MRN: [id number]  Chief Complaint  Patient presents with   Medical Management of Chronic Issues   COPD    Breathing has improved some since the last visit. He has occ cough with clear sputum.     Referring provider: Chandra Toribio POUR, MD  HPI: 83 year old male, former smoker (57 pack year history) followed for severe asthma/COPD on biologic. He is a patient of Mario Proctor and last seen in office 02/21/2024 by [facility] NP. Past medical history significant for a fib, cardiomyopathy, chronic combined systolic and diastolic HF. He is also followed by the lung cancer screening program.  TEST/EVENTS:  01/07/2017 PFTs: FVC 3.96 (89%), FEV1 1.86 (58), ratio 47, TLC 113%, DLCO uncorrected 17.71 (52%).  Moderate obstructive airways disease 10/14/2020 CT chest without contrast: Atherosclerosis.  Scattered small mediastinal lymph nodes evident.  Centrilobular and paraseptal emphysema.  Previously identified nodular area in RLL resolved; some very subtle groundglass attenuation.  No new suspicious pulmonary nodule or mass.  Tiny nodule left apex unchanged.  Stable 2 cm exophytic lesion upper pole right kidney.  Recommended return for lung cancer screening. 03/15/2020 echocardiogram: EF 20 to 25%.  LV moderately dilated.  Dilated cardiomyopathy.  Mild LVH.  G1 DD.  Mild to moderate mitral regurgitation.  Mild to moderate tricuspid regurgitation.  Moderate pulmonary hypertension.  IVC dilated with poor inspiration collapse consistent with elevated right atrial pressure. 11/06/2020 echocardiogram: LVEF 30 to 35%.  Severe global hypokinesis.  G1 DD.  LA severely dilated.  Mild mitral regurgitation.  Mild tricuspid regurgitation.  Improvement in PASP from 44 to 20 mmHg.  05/03/2019 CT chest without contrast: Atherosclerosis.  Small mediastinal lymph nodes, including a 9 mm short axis AP window node, likely reactive.  2.7 x 4.2 cm irregular/spiculated masslike  opacity in the posterior RLL.  Mild associated hyperdensity within the lesion.  Adjacent debris's within RLL bronchi.  1.4 x 2.6 cm irregular nodular opacity in the posterior RUL and 7x12 mm irregular nodular opacity in the posterior LUL. Additional mild irregular linear/nodular opacity in RUL. Moderate centrilobular and paraseptal emphysematous changes. Mild superior endplate compression fx at L1. 05/04/2021 echocardiogram: LVEF 45 to 50%.  Mild LVH.  Diastolic parameters normal.  Trivial MVR.  No evidence of pulmonary hypertension 04/20/2022 CT chest: atherosclerosis. Severe emphysema. Changes of chronic bronchitis. Indistinct 4 mm LUL nodule, stable and considered benign. 2.1 cm ground glass LUL resolved. Simple kidney cyst. Chronic mild T12 fx.  01/11/2024 CXR: increased chronic interstitial changes with possible central peribronchial thickening suggestive of bronchitis   04/13/2023: OV with Mario Proctor. Former pt of Mario Proctor. Here to establish. Been on Dupixent  since October. Doing well on this and Breztri . Last prednisone  August 2024. Independent with ADLs. Using albuterol  2-3 x/day. Still smoking 3-4/day. Completed pulm rehab 2023.   01/11/2024: Mario Proctor with Mario Fluckiger NP Mario Proctor is an 83 year old male who presents with increased shortness of breath. His wife is with him and helps tor provide history.  He has experienced increased shortness of breath over the past couple of weeks. Symptoms started with a cough and a lot of mucus. He still has a cough with some phlegm, which is less severe than before, but he has been experiencing postnasal drainage causing him to feel 'choked up' during sleep the last two nights. He has not been using saline rinses or nasal sprays. He's not sure what color the  mucus is.  He uses his albuterol  rescue inhaler more frequently, multiple times a day. He also uses nebulizer treatments twice daily, consistent with his usual routine. No missed doses of Xarelto  have occurred,  and he had his last Dupixent  injection last Wednesday. He is awaiting the next shipment of Dupixent  due to pending paperwork. No fever, hemoptysis, or significant changes in sputum color are present. The patient reports that wheezing is noticed when he gets up and moves. He has not used antibiotics recently. No sore throat, ear pain, facial tenderness, CP, leg swelling  01/25/2024: OV with Mario Case NP. Mario Proctor is an 83 year old male with severe lung disease who presents for follow-up after flare up.  He has experienced improvement in his breathing and a reduction in coughing since his last visit two weeks ago, although he does not feel completely back to his normal state. He was previously treated for bronchitis with z pack and prednisone .  He is currently off prednisone . He is on Dupixent  for maintenance therapy and uses Breztri  as a maintenance therapy and albuterol  for emergency purposes. No increased oxygen  requirements.  He does not recall the color of his productive cough. No significant amount of phlegm to spit out and did not feel worse after stopping prednisone . No fevers, hemoptysis, wheezing, chest congestion.   02/21/2024: OV with Mario Sebastiani NP Mario Proctor is an 83 year old male with asthma and COPD who presents with increased cough and chest congestion. He has experienced an increased cough and chest congestion for the past three to four days. Breathing feels a little more short. The cough is productive with clear sputum, and he has rhinorrhea with clear drainage. No headaches, fever, or hemoptysis are present. No increased wheezing that they've noticed. No leg swelling, CP, weight gain. No known sick exposures.  He is currently using Breztri  twice a day and has been using his nebulizer once a day. He previously used Mucinex  but has not continued it after finishing the last course. He is also using Flonase  nasal spray. No increased oxygen  requirements.  He has not yet  obtained the new nebulized medication, Ohtuvayre , due to cost issues, as it requires a $2,000 copay which he has not yet met. He thinks he is only about $100 away from this.   03/21/2024: Today - follow up Discussed the use of AI scribe software for clinical note transcription with the patient, who gave verbal consent to proceed. History of Present Illness  Mario Proctor is an 83 year old male with COPD who presents with difficulty sleeping due to sinus drainage.  He has experienced difficulty sleeping over the past week, attributing it to sinus drainage that worsens when he lies down. Elevating his head by sitting up slightly has helped. He suspects that the drainage is causing him to feel 'clogged up' at night. He uses Flonase  nasal spray once daily, typically in the morning, but has not tried using it at night.   Breathing feels better than his last visit. Symptoms are worse some days than others but overall, feeling stable. Rare cough related to the sinus drainage. No change in sputum production. Usually clear to white. No wheezing. No fevers, chills, hemoptysis. No orthopnea, PND. He has not yet started using Otuvaer due to financial constraints but is in the process of completing the necessary paperwork for assistance. He notices an increased heart rate when he removes his oxygen . He is mindful of keeping his oxygen  saturation above  88%. No palpitations, CP.     No Known Allergies  Immunization History  Administered Date(s) Administered   Fluad Quad(high Dose 65+) 02/23/2019, 02/08/2021   INFLUENZA, HIGH DOSE SEASONAL PF 02/15/2016, 04/16/2017, 04/04/2018, 03/11/2020, 01/19/2023, 03/09/2024   Influenza-Unspecified 03/11/2022   PFIZER(Purple Top)SARS-COV-2 Vaccination 06/15/2019, 07/10/2019, 04/02/2020   Pneumococcal-Unspecified 10/16/2010   Tdap 08/29/2020    Past Medical History:  Diagnosis Date   A-fib [facility])    Allergy    CHF (congestive heart failure) (HCC)     Dupuytren contracture    right sm finger   Myocardial infarct (HCC) 2019   Myocardial infarction [facility])    Small bowel obstruction (HCC)    Stage 4 very severe COPD by GOLD classification (HCC) 05/14/2020    Tobacco History: Social History   Tobacco Use  Smoking Status Some Days   Current packs/day: 2.00   Average packs/day: 2.0 packs/day for 50.0 years (100.0 ttl pk-yrs)   Types: Cigarettes   Passive exposure: Current  Smokeless Tobacco Never  Tobacco Comments   Sneaks a cigarette once in a while Updated 01/25/24   Ready to quit: Not Answered Counseling given: Not Answered Tobacco comments: Sneaks a cigarette once in a while Updated 01/25/24   Outpatient Medications Prior to Visit  Medication Sig Dispense Refill   albuterol  (ACCUNEB ) 1.25 MG/3ML nebulizer solution Take 3 mLs (1.25 mg total) by nebulization every 4 (four) hours as needed for wheezing or shortness of breath. 75 mL 5   albuterol  (VENTOLIN  HFA) 108 (90 Base) MCG/ACT inhaler INHALE 2 PUFFS EVERY 6 HOURS AS NEEDED FOR WHEEZE OR SHORTNESS OF BREATH 18 each 11   amLODipine  (NORVASC ) 5 MG tablet Take 1 tablet (5 mg total) by mouth daily. 90 tablet 0   atorvastatin  (LIPITOR) 10 MG tablet Take 1 tablet (10 mg total) by mouth daily. 90 tablet 3   Budeson-Glycopyrrol-Formoterol  (BREZTRI  AEROSPHERE) 160-9-4.8 MCG/ACT AERO Inhale 2 puffs into the lungs in the morning and at bedtime. 10.7 g 5   clobetasol  cream (TEMOVATE ) 0.05 % Apply 1 Application topically 2 (two) times daily as needed. 80 g 3   Dupilumab  (DUPIXENT ) 300 MG/2ML SOAJ Inject 300 mg into the skin every 14 (fourteen) days. 12 mL 1   fluticasone  (FLONASE ) 50 MCG/ACT nasal spray Place 2 sprays into both nostrils daily. 18.2 mL 2   hydrOXYzine  (VISTARIL ) 25 MG capsule TAKE 1 CAPSULE BY MOUTH AT BEDTIME AS NEEDED FOR ITCHING 90 capsule 1   lisinopril  (ZESTRIL ) 10 MG tablet TAKE 1 TABLET BY MOUTH EVERYDAY AT BEDTIME 90 tablet 3   metoprolol  succinate (TOPROL -XL) 25  MG 24 hr tablet Take 1 tablet (25 mg total) by mouth daily. 90 tablet 3   XARELTO  20 MG TABS tablet TAKE 1 TABLET BY MOUTH DAILY WITH SUPPER. 90 tablet 3   azithromycin  (ZITHROMAX ) 250 MG tablet Take 2 tablets on day one then take 1 tablet daily for four additional days (Patient not taking: Reported on 03/09/2024) 6 tablet 0   budesonide -glycopyrrolate -formoterol  (BREZTRI  AEROSPHERE) 160-9-4.8 MCG/ACT AERO inhaler Inhale 2 puffs into the lungs in the morning and at bedtime. 3 each 4   predniSONE  (DELTASONE ) 10 MG tablet 4 tabs for 3 days, then 3 tabs for 3 days, 2 tabs for 3 days, then 1 tab for 3 days, then stop 20 tablet 0   predniSONE  (DELTASONE ) 10 MG tablet Take 1 tablet (10 mg total) by mouth daily with breakfast. 10 tablet 0   No facility-administered medications prior to visit.  Review of Systems: As above    Physical Exam:  BP (!) 147/79   Pulse (!) 55   Temp (!) 97.4 F (36.3 C) (Oral)   Ht 5' 11 (1.803 m) Comment: per pt  Wt 166 lb 9.6 oz (75.6 kg)   SpO2 92% Comment: 2lpm cont o2  BMI 23.24 kg/m   GEN: Pleasant, interactive, chronically-ill appearing; in no acute distress. HEENT:  Normocephalic and atraumatic.  PERRLA. Sclera white. Nasal turbinates pink, moist and patent bilaterally. No rhinorrhea present. Oropharynx pink and moist, without exudate or edema. No lesions, ulcerations, or postnasal drip.  NECK:  Supple w/ fair ROM. No JVD present. No lymphadenopathy.   CV: Irregular rhythm, rate controlled, no m/r/g, no peripheral edema. Pulses intact, +2 bilaterally. No cyanosis, pallor or clubbing. PULMONARY:  Unlabored, regular breathing. Decreased airflow b/l without wheezes/rales/rhonchi. No accessory muscle use. No dullness to percussion.  GI: BS present and normoactive. Soft, non-tender to palpation.  MSK: No erythema, warmth or tenderness. Cap refil <2 sec all extrem. Muscle wasting  Neuro: A/Ox3. No focal deficits noted.   Skin: Warm, no lesions or  rashe Psych: Normal affect and behavior. Judgement and thought content appropriate.     Lab Results:  CBC    Component Value Date/Time   WBC 9.5 03/09/2024 1007   WBC 7.6 04/24/2022 1002   RBC 4.75 03/09/2024 1007   RBC 4.69 04/24/2022 1002   HGB 14.0 03/09/2024 1007   HCT 45.3 03/09/2024 1007   PLT 266 03/09/2024 1007   MCV 95 03/09/2024 1007   MCH 29.5 03/09/2024 1007   MCH 31.4 05/06/2021 0607   MCHC 30.9 (L) 03/09/2024 1007   MCHC 33.0 04/24/2022 1002   RDW 11.7 03/09/2024 1007   LYMPHSABS 2.1 03/09/2024 1007   MONOABS 0.7 04/24/2022 1002   EOSABS 0.4 03/09/2024 1007   BASOSABS 0.1 03/09/2024 1007    BMET    Component Value Date/Time   NA 141 03/09/2024 1007   K 4.8 03/09/2024 1007   CL 97 03/09/2024 1007   CO2 30 (H) 03/09/2024 1007   GLUCOSE 83 03/09/2024 1007   GLUCOSE 95 01/23/2022 1039   BUN 18 03/09/2024 1007   CREATININE 1.11 03/09/2024 1007   CALCIUM  9.0 03/09/2024 1007   GFRNONAA 52 (L) 05/06/2021 0607   GFRAA 45 (L) 07/04/2020 0920    BNP    Component Value Date/Time   BNP 428.6 (H) 03/09/2024 1007   BNP 292.4 (H) 05/06/2021 0607     Imaging:  No results found.   Administration History     None          Latest Ref Rng & Units 09/14/2022    9:47 AM 01/07/2017   12:37 PM  PFT Results  FVC-Pre L 2.71  3.63   FVC-Predicted Pre % 67  82   FVC-Post L 2.88  3.96   FVC-Predicted Post % 71  89   Pre FEV1/FVC % % 38  48   Post FEV1/FCV % % 37  47   FEV1-Pre L 1.02  1.74   FEV1-Predicted Pre % 35  54   FEV1-Post L 1.07  1.86   DLCO uncorrected ml/min/mmHg 6.56  15.81   DLCO UNC% % 26  46   DLCO corrected ml/min/mmHg 6.56  17.71   DLCO COR %Predicted % 26  52   DLVA Predicted % 29  55   TLC L 10.18  8.24   TLC % Predicted % 144  113   RV %  Predicted % 242  152     No results found for: NITRICOXIDE      Assessment & Plan:   Assessment & Plan Chronic obstructive pulmonary disease (COPD) with chronic respiratory failure  and hypoxia COPD with chronic respiratory failure and hypoxia. Improved following mild exacerbation. Encouraged to start Ohtuvayre  as soon as possible. Continue aggressive maintenance regimen. Oxygen  therapy is crucial to prevent increased cardiac workload and maintain adequate oxygenation. Action plan in place.  - Ensure oxygen  therapy is used to maintain oxygen  saturation >88-90% - Follow up on Ohtuvayre  application process and initiate therapy once approved. - Monitor oxygen  levels and heart rate when not using supplemental oxygen . - Continue Breztri  bid - Continue dupixent  injections - Continue albuterol  PRN - Continue guaifenesin  for mucociliary clearance   Allergic rhinitis with nocturnal postnasal drainage Contributing to sleep disturbances. Current use of Flonase  nasal spray in the morning. Will add on ipratropium nasal spray. Advised to obtain a wedge pillow as well to help facilitate drainage as postural therapy appears to be beneficial. - Prescribed ipratropium nasal spray for use up to three times a day  - Recommended using a wedge pillow to elevate head and facilitate drainage. - Continue flonase  nasal spray    I spent 35 minutes of dedicated to the care of this patient on the date of this encounter to include pre-visit review of records, face-to-face time with the patient discussing conditions above, post visit ordering of testing, clinical documentation with the electronic health record, making appropriate referrals as documented, and communicating necessary findings to members of the patients care team.   Comer LULLA Rouleau, NP 03/21/2024  Pt aware and understands NP's role.

## 2024-03-23 ENCOUNTER — Ambulatory Visit: Admitting: Nurse Practitioner

## 2024-03-24 ENCOUNTER — Encounter (HOSPITAL_COMMUNITY): Payer: Self-pay | Admitting: Emergency Medicine

## 2024-03-24 ENCOUNTER — Ambulatory Visit: Payer: Self-pay

## 2024-03-24 ENCOUNTER — Other Ambulatory Visit: Payer: Self-pay

## 2024-03-24 ENCOUNTER — Emergency Department (HOSPITAL_COMMUNITY)
Admission: EM | Admit: 2024-03-24 | Discharge: 2024-03-24 | Disposition: A | Discharge status: Home / Self Care | Attending: Emergency Medicine | Admitting: Emergency Medicine

## 2024-03-24 DIAGNOSIS — Z7901 Long term (current) use of anticoagulants: Secondary | ICD-10-CM | POA: Insufficient documentation

## 2024-03-24 DIAGNOSIS — Z7951 Long term (current) use of inhaled steroids: Secondary | ICD-10-CM | POA: Diagnosis not present

## 2024-03-24 DIAGNOSIS — J449 Chronic obstructive pulmonary disease, unspecified: Secondary | ICD-10-CM | POA: Diagnosis not present

## 2024-03-24 DIAGNOSIS — R040 Epistaxis: Secondary | ICD-10-CM | POA: Insufficient documentation

## 2024-03-24 LAB — CBC
HCT: 40.6 % (ref 39.0–52.0)
Hemoglobin: 12.5 g/dL — ABNORMAL LOW (ref 13.0–17.0)
MCH: 29.7 pg (ref 26.0–34.0)
MCHC: 30.8 g/dL (ref 30.0–36.0)
MCV: 96.4 fL (ref 80.0–100.0)
Platelets: 296 K/uL (ref 150–400)
RBC: 4.21 MIL/uL — ABNORMAL LOW (ref 4.22–5.81)
RDW: 12.9 % (ref 11.5–15.5)
WBC: 9.3 K/uL (ref 4.0–10.5)
nRBC: 0 % (ref 0.0–0.2)

## 2024-03-24 LAB — BASIC METABOLIC PANEL WITH GFR
Anion gap: 12 (ref 5–15)
BUN: 25 mg/dL — ABNORMAL HIGH (ref 8–23)
CO2: 29 mmol/L (ref 22–32)
Calcium: 8.8 mg/dL — ABNORMAL LOW (ref 8.9–10.3)
Chloride: 100 mmol/L (ref 98–111)
Creatinine, Ser: 1.31 mg/dL — ABNORMAL HIGH (ref 0.61–1.24)
GFR, Estimated: 54 mL/min — ABNORMAL LOW (ref >60)
Glucose, Bld: 95 mg/dL (ref 70–99)
Potassium: 5 mmol/L (ref 3.5–5.1)
Sodium: 141 mmol/L (ref 135–145)

## 2024-03-24 MED ORDER — IPRATROPIUM-ALBUTEROL 0.5-2.5 (3) MG/3ML IN SOLN
3.0000 mL | Freq: Once | RESPIRATORY_TRACT | Status: AC
  Administered 2024-03-24: 3 mL via RESPIRATORY_TRACT
  Filled 2024-03-24: qty 3

## 2024-03-24 MED ORDER — SILVER NITRATE-POT NITRATE 75-25 % EX MISC
1.0000 | Freq: Once | CUTANEOUS | Status: AC
  Administered 2024-03-24: 1 via TOPICAL
  Filled 2024-03-24: qty 1

## 2024-03-24 MED ORDER — TRANEXAMIC ACID FOR EPISTAXIS
500.0000 mg | Freq: Once | TOPICAL | Status: AC
  Administered 2024-03-24: 500 mg via TOPICAL
  Filled 2024-03-24 (×2): qty 10

## 2024-03-24 MED ORDER — LIDOCAINE-EPINEPHRINE 1 %-1:100000 IJ SOLN
10.0000 mL | Freq: Once | INTRAMUSCULAR | Status: AC
  Administered 2024-03-24: 10 mL via INTRADERMAL
  Filled 2024-03-24: qty 1

## 2024-03-24 MED ORDER — OXYMETAZOLINE HCL 0.05 % NA SOLN
1.0000 | Freq: Once | NASAL | Status: AC
  Administered 2024-03-24: 1 via NASAL
  Filled 2024-03-24: qty 30

## 2024-03-24 MED ORDER — LIDOCAINE HCL URETHRAL/MUCOSAL 2 % EX GEL
1.0000 | Freq: Once | CUTANEOUS | Status: AC
  Administered 2024-03-24: 1 via TOPICAL
  Filled 2024-03-24: qty 11

## 2024-03-24 NOTE — ED Notes (Signed)
 Nose clip applied. MD Trifan at bedside.

## 2024-03-24 NOTE — ED Triage Notes (Addendum)
 PT has had nosebleed intermittently since yesterday evening.  Pt is on Xarelto . Pt is on 2L as needed.  He is keeping [place] in mouth right now.  Sats got as low as 76 at home. SABRA  He has tried Afrin at home and wife tried a tampon which just made blood come out of other side.

## 2024-03-24 NOTE — Telephone Encounter (Signed)
 FYI Only or Action Required?: Action required by provider: request for appointment.  Patient was last seen in primary care on 03/09/2024 by Chandra Toribio POUR, MD.  Called Nurse Triage reporting Epistaxis.  Symptoms began yesterday.  Interventions attempted: Rest, hydration, or home remedies.  Symptoms are: gradually worsening.Pt. On Xarelto  and having 4-5 nose bleed x 24 hours. It keeps drippingWill call 911. Pt. Uses oxygen .  Triage Disposition: Call EMS 911 Now  Patient/caregiver understands and will follow disposition?: Yes     Copied from CRM #[id number]. Topic: Clinical - Red Word Triage >> Mar 24, 2024  8:13 AM Emylou G wrote: [facility] that prompted transfer to Nurse Triage: Wife call said pulm prescribed patient: ipratropium (ATROVENT) 0.06 % nasal spray - this was his 11/11 appt.. but his nose won't stop bleeding.  Struggling w/breathing.. Reason for Disposition  Sounds like a life-threatening emergency to the triager  Answer Assessment - Initial Assessment Questions 1. AMOUNT OF BLEEDING: How bad is the bleeding? How much blood was lost? Has the bleeding stopped?     Starts and stops 2. ONSET: When did the nosebleed start?      yesterday 3. FREQUENCY: How many nosebleeds have you had in the last 24 hours?      4-5 4. RECURRENT SYMPTOMS: Have there been other recent nosebleeds? If Yes, ask: How long did it take you to stop the bleeding? What worked best?      no 5. CAUSE: What do you think caused this nosebleed?     New nasal spray 6. LOCAL FACTORS: Do you have any cold symptoms?, Have you been rubbing or picking at your nose?     no 7. SYSTEMIC FACTORS: Do you have high blood pressure or any bleeding problems?     no 8. BLOOD THINNERS: Do you take any blood thinners? (e.g., aspirin , clopidogrel / Plavix, coumadin, heparin ). Notes: Other strong blood thinners include: Arixtra (fondaparinux), Eliquis (apixaban), Pradaxa (dabigatran), and Xarelto   (rivaroxaban ).     Xarelto  9. OTHER SYMPTOMS: Do you have any other symptoms? (e.g., lightheadedness)     SOB, saturation 95%, but got down to 75% 10. PREGNANCY: Is there any chance you are pregnant? When was your last menstrual period?       N/a  Protocols used: Nosebleed-A-AH

## 2024-03-24 NOTE — Discharge Instructions (Addendum)
 You will need to have your nose packing removed in 3 to 5 days.  Can schedule follow-up appoint with your doctors office to have this done.  In the meantime, I recommend that you hold your Xarelto  tonight only, and then resume it tomorrow.

## 2024-03-24 NOTE — ED Provider Notes (Signed)
 [place] [facility] AT [facility] Provider Note   CSN: [id number] Arrival date & time: 03/24/24  9058     Patient presents with: No chief complaint on file.   Mario Proctor is a 83 y.o. male presenting to the ED with complaint of nosebleed.  Patient is on Xarelto .  He took it last night.  He said he has been bleeding since last night mostly out of the right nose.  He says he has chronic COPD, is always wheezing and short of breath, wears 2 L oxygen  as needed.   HPI     Prior to Admission medications   Medication Sig Start Date End Date Taking? Authorizing Provider  albuterol  (ACCUNEB ) 1.25 MG/3ML nebulizer solution Take 3 mLs (1.25 mg total) by nebulization every 4 (four) hours as needed for wheezing or shortness of breath. 01/25/24   Cobb, Comer GAILS, NP  albuterol  (VENTOLIN  HFA) 108 (90 Base) MCG/ACT inhaler INHALE 2 PUFFS EVERY 6 HOURS AS NEEDED FOR WHEEZE OR SHORTNESS OF BREATH 03/06/24   Mannam, Praveen, MD  amLODipine  (NORVASC ) 5 MG tablet Take 1 tablet (5 mg total) by mouth daily. 03/09/24   Ladona Heinz, MD  atorvastatin  (LIPITOR) 10 MG tablet Take 1 tablet (10 mg total) by mouth daily. 06/16/23 03/21/24  Ladona Heinz, MD  Budeson-Glycopyrrol-Formoterol  (BREZTRI  AEROSPHERE) 160-9-4.8 MCG/ACT AERO Inhale 2 puffs into the lungs in the morning and at bedtime. 04/15/23   Desai, Nikita S, MD  clobetasol  cream (TEMOVATE ) 0.05 % Apply 1 Application topically 2 (two) times daily as needed. 03/09/23   Chandra Toribio POUR, MD  Dupilumab  (DUPIXENT ) 300 MG/2ML SOAJ Inject 300 mg into the skin every 14 (fourteen) days. 01/11/24   Meade Verdon RAMAN, MD  fluticasone  (FLONASE ) 50 MCG/ACT nasal spray Place 2 sprays into both nostrils daily. 01/11/24   Cobb, Comer GAILS, NP  hydrOXYzine  (VISTARIL ) 25 MG capsule TAKE 1 CAPSULE BY MOUTH AT BEDTIME AS NEEDED FOR ITCHING 10/14/23   Olson, Daniel K, MD  ipratropium (ATROVENT) 0.06 % nasal spray Place 2 sprays into both nostrils 3 (three) times  daily as needed for rhinitis (nasal congestion/drainage). 03/21/24   Cobb, Comer GAILS, NP  lisinopril  (ZESTRIL ) 10 MG tablet TAKE 1 TABLET BY MOUTH EVERYDAY AT BEDTIME 03/16/23   Ladona Heinz, MD  metoprolol  succinate (TOPROL -XL) 25 MG 24 hr tablet Take 1 tablet (25 mg total) by mouth daily. 10/27/23 10/21/24  Chandra Toribio POUR, MD  XARELTO  20 MG TABS tablet TAKE 1 TABLET BY MOUTH DAILY WITH SUPPER. 01/19/24   Ladona Heinz, MD    Allergies: Patient has no known allergies.    Review of Systems  Updated Vital Signs BP 129/76   Pulse 68   Temp 97.7 F (36.5 C)   Resp 16   SpO2 92%   Physical Exam Constitutional:      General: He is not in acute distress. HENT:     Head: Normocephalic and atraumatic.     Comments: Bleeding from right nares Eyes:     Conjunctiva/sclera: Conjunctivae normal.     Pupils: Pupils are equal, round, and reactive to light.  Cardiovascular:     Rate and Rhythm: Normal rate and regular rhythm.  Pulmonary:     Effort: Pulmonary effort is normal. No respiratory distress.     Comments: Wheezing on exam Abdominal:     General: There is no distension.     Tenderness: There is no abdominal tenderness.  Skin:    General: Skin is warm and  dry.  Neurological:     General: No focal deficit present.     Mental Status: He is alert. Mental status is at baseline.  Psychiatric:        Mood and Affect: Mood normal.        Behavior: Behavior normal.     (all labs ordered are listed, but only abnormal results are displayed) Labs Reviewed  BASIC METABOLIC PANEL WITH GFR - Abnormal; Notable for the following components:      Result Value   BUN 25 (*)    Creatinine, Ser 1.31 (*)    Calcium  8.8 (*)    GFR, Estimated 54 (*)    All other components within normal limits  CBC - Abnormal; Notable for the following components:   RBC 4.21 (*)    Hemoglobin 12.5 (*)    All other components within normal limits    EKG: None  Radiology: No results found.   Epistaxis  Management  Date/Time: 03/25/2024 6:29 AM  Performed by: Cottie Donnice PARAS, MD Authorized by: Cottie Donnice PARAS, MD   Consent:    Consent obtained:  Verbal   Consent given by:  Patient and spouse   Risks, benefits, and alternatives were discussed: yes     Risks discussed:  Bleeding, infection, nasal injury and pain Universal protocol:    Procedure explained and questions answered to patient or proxy's satisfaction: yes     Relevant documents present and verified: yes     Required blood products, implants, devices, and special equipment available: yes     Site/side marked: yes     Immediately prior to procedure, a time out was called: yes     Patient identity confirmed:  Arm band Anesthesia:    Anesthesia method:  Topical application   Topical anesthetic:  LET Procedure details:    Treatment site:  R anterior   Treatment method:  Anterior pack   Treatment complexity:  Limited   Treatment episode: initial   Post-procedure details:    Assessment:  Bleeding stopped   Procedure completion:  Tolerated well, no immediate complications    Medications Ordered in the ED  lidocaine -EPINEPHrine  (XYLOCAINE  W/EPI) 1 %-1:100000 (with pres) injection 10 mL (10 mLs Intradermal Given by Other 03/24/24 1038)  tranexamic acid (CYKLOKAPRON) 1000 MG/10ML topical solution 500 mg (500 mg Topical Given by Other 03/24/24 1038)  oxymetazoline  (AFRIN) 0.05 % nasal spray 1 spray (1 spray Each Nare Given 03/24/24 1029)  ipratropium-albuterol  (DUONEB) 0.5-2.5 (3) MG/3ML nebulizer solution 3 mL (3 mLs Nebulization Given 03/24/24 1030)  silver nitrate applicators applicator 1 Application (1 Application Topical Given by Other 03/24/24 1137)  lidocaine  (XYLOCAINE ) 2 % jelly 1 Application (1 Application Topical Given by Other 03/24/24 1137)    Clinical Course as of 03/25/24 0627  Fri Mar 24, 2024  1124 Initial packing was removed and the wound appears hemostatic, as the patient not having further bleeding.  I  was not able to visualize an obvious anterior septal bleed for cauterization.  It may be slightly more posterior.  We'll reapply another pledglet with pressure to ensure the bleeding is hemostatic [MT]  1206 Reassessed and bleeding stopped [MT]  1350 Large Rhino Rocket placed in the right naris, bleeding is hemostatic afterwards, patient is stable and ready to go [MT]    Clinical Course User Index [MT] Johathan Province, Donnice PARAS, MD  Medical Decision Making Amount and/or Complexity of Data Reviewed Labs: ordered.  Risk OTC drugs. Prescription drug management.   Patient is here with complaint of right-sided nosebleed.  This may be exacerbated by the fact that he wears oxygen  at home and dries out his nose and is also on Xarelto .  Blood pressure was high but the patient did not appear agitated on arrival.  Afrin was applied, as well as direct pressure to the nose.  Continues to have trickling bleed from the right nose.  I was unable to directly visualize anterior bleed source on initial exam due to amount of blood, but the nose is now packed with applied to light with TXA and lidocaine  with epinephrine .  I will reassess shortly.  Patient reports he is chronically wheezing, chronically short of breath for his COPD.  He does have audible wheezing on exam.  He might benefit from a DuoNeb, difficulty applying oxygen  with right nares packed, but no significant hypoxia at this time   Update - at the time of discharge, patient began rebleeding.  I consented him for nasal packing and a long rhinorocket was placed in right nares, with cessation of bleeding. Follow up instructions provided.     Final diagnoses:  Nosebleed    ED Discharge Orders     None          Cottie Donnice PARAS, MD 03/25/24 [phone]

## 2024-03-27 ENCOUNTER — Ambulatory Visit (INDEPENDENT_AMBULATORY_CARE_PROVIDER_SITE_OTHER): Admitting: Family Medicine

## 2024-03-27 ENCOUNTER — Telehealth: Payer: Self-pay

## 2024-03-27 ENCOUNTER — Encounter: Payer: Self-pay | Admitting: Family Medicine

## 2024-03-27 VITALS — BP 139/75 | HR 76 | Temp 98.0°F | Ht 71.0 in | Wt 153.1 lb

## 2024-03-27 DIAGNOSIS — J449 Chronic obstructive pulmonary disease, unspecified: Secondary | ICD-10-CM

## 2024-03-27 DIAGNOSIS — R040 Epistaxis: Secondary | ICD-10-CM | POA: Diagnosis not present

## 2024-03-27 NOTE — Telephone Encounter (Signed)
 Order placed and pts wife is aware. Nothing further needed.

## 2024-03-27 NOTE — Assessment & Plan Note (Signed)
 Presents for removal of nasal packing placed in the ED for severe epistaxis. History is significant for being on Xarelto . Upon removal of the packing in the office, active bleeding immediately recurred. - held pressure for 30 min w/o resolution of bleeding.  - pt was able to see [facility] today, so he was sent over for further mgmt.  We do not have the equipment here to replace his rhino rocket.

## 2024-03-27 NOTE — Telephone Encounter (Signed)
 Yes, please send order to add humidity

## 2024-03-27 NOTE — Patient Instructions (Signed)
 It was nice to see you today,  We addressed the following topics today: - please go to [facility] on [address].   Have a great day,  Rolan Slain, MD

## 2024-03-27 NOTE — Telephone Encounter (Signed)
 Copied from CRM #[id number]. Topic: Clinical - Order For Equipment >> Mar 27, 2024  2:03 PM Leila C wrote: Reason for CRM: Patient [phone] wants to speak NP, Cobb regarding humidifer for his oxygen  concentrator and [employer] [facility] needs a new prescription. Patient does not have [employer] [facility] phone number right now. Please advise and call back.  Called and spoke with the pts wife DPR. Pt had nose bleed last week and had to go to ED since it wouldn't stop and he is on blood thinner. Pts wife is concerned oxygen  is drying nose out. Pt wears 2L and/or 3L.  Izetta can you advise if we can place a DME order to add humidity to O2?

## 2024-03-27 NOTE — Progress Notes (Signed)
   Established Patient Office Visit  Subjective   Patient ID: Mario Proctor, male    DOB: 1940-12-22  Age: 83 y.o. MRN: [id number]  Chief Complaint  Patient presents with   Medical Management of Chronic Issues    HPI  Subjective - Follow up for removal of nasal packing for epistaxis. - Reports minimal bleeding since packing was placed, described as not bright red blood. - Denies attempting to remove packing at home. - Attended ED for epistaxis which started on Thursday, initially stopped, then restarted at 3 AM. Presented to hospital at 6 AM. - At home, attempted to control bleeding by packing nose and applying ice. Did not hold sustained pressure. - In hospital, pressure was held with clamps for approximately 15 minutes without success. - Reports packing has been annoying. - Post-removal, active bleeding from the nose recurred.  Medications Reports taking Xarelto . Instructed to hold for two days prior to procedure, but did not take last night's dose.   PMH, PSH, FH, Social Hx No history discussed.  ROS Pertinent positives: epistaxis. Pertinent negatives: denies significant drainage in posterior pharynx while packing was in place.  Objective     The ASCVD Risk score (Arnett DK, et al., 2019) failed to calculate for the following reasons:   The 2019 ASCVD risk score is only valid for ages 83 to 44   Risk score cannot be calculated because patient has a medical history suggesting prior/existing ASCVD  Health Maintenance Due  Topic Date Due   Pneumococcal Vaccine: 50+ Years (1 of 2 - PCV) [birth year]/06/1959   Zoster Vaccines- Shingrix (1 of 2) Never done   Medicare Annual Wellness (AWV)  11/03/2023   COVID-19 Vaccine (4 - 2025-26 season) 01/10/2024      Objective:     BP 139/75   Pulse 76   Temp 98 F (36.7 C) (Oral)   Ht 5' 11 (1.803 m)   Wt 153 lb 1.9 oz (69.5 kg)   SpO2 98%   BMI 21.36 kg/m    Physical Exam Gen: alert, oriented HEENT: Removal of nasal  packing from rightnostril. Upon removal, active bleeding noted from the nare. Some clots observed. Patient instructed to apply direct pressure by pinching the nose, bleeding did not resolve after approx 30 min of holding pressure. . Pulm: no respiratory distress Psych: pleasant affect   No results found for any visits on 03/27/24.      Assessment & Plan:   Recurrent epistaxis Assessment & Plan: Presents for removal of nasal packing placed in the ED for severe epistaxis. History is significant for being on Xarelto . Upon removal of the packing in the office, active bleeding immediately recurred. - held pressure for 30 min w/o resolution of bleeding.  - pt was able to see [facility] today, so he was sent over for further mgmt.  We do not have the equipment here to replace his rhino rocket.        Return if symptoms worsen or fail to improve.    Toribio MARLA Slain, MD

## 2024-04-06 ENCOUNTER — Other Ambulatory Visit: Payer: Self-pay | Admitting: Nurse Practitioner

## 2024-04-06 DIAGNOSIS — J069 Acute upper respiratory infection, unspecified: Secondary | ICD-10-CM

## 2024-04-10 NOTE — Telephone Encounter (Signed)
 Received fax from [employer] stating patient was enrolled in [employer] PAP and can received Ohtuvayre  at no cost through 05/10/24.   Ohtuvayre  Rx will be forwarded to [facility], which may contact our office to confirm Rx prior to shipment. Will await fax from [facility].

## 2024-04-10 NOTE — Telephone Encounter (Signed)
 Received fax from [employer] stating the pt was approved for patient assistance program through 05/10/24. He will receive Ohtuvayre  at no charge from [employer]. NFN

## 2024-04-14 ENCOUNTER — Other Ambulatory Visit: Payer: Self-pay | Admitting: Family Medicine

## 2024-04-14 DIAGNOSIS — L308 Other specified dermatitis: Secondary | ICD-10-CM

## 2024-05-10 ENCOUNTER — Telehealth: Payer: Self-pay

## 2024-05-10 NOTE — Telephone Encounter (Signed)
 Received fax from [employer]  [employer] stating pt was approved to continue to receive Dupixent  through PAP until 05/10/25. Letter has been sent to media tab for retention.

## 2024-05-12 ENCOUNTER — Ambulatory Visit: Admitting: Nurse Practitioner

## 2024-05-12 ENCOUNTER — Encounter: Payer: Self-pay | Admitting: Nurse Practitioner

## 2024-05-12 VITALS — BP 114/62 | HR 63 | Temp 98.1°F | Ht 71.0 in | Wt 170.0 lb

## 2024-05-12 DIAGNOSIS — J9610 Chronic respiratory failure, unspecified whether with hypoxia or hypercapnia: Secondary | ICD-10-CM | POA: Diagnosis not present

## 2024-05-12 DIAGNOSIS — J441 Chronic obstructive pulmonary disease with (acute) exacerbation: Secondary | ICD-10-CM

## 2024-05-12 DIAGNOSIS — Z87891 Personal history of nicotine dependence: Secondary | ICD-10-CM | POA: Diagnosis not present

## 2024-05-12 DIAGNOSIS — J9611 Chronic respiratory failure with hypoxia: Secondary | ICD-10-CM

## 2024-05-12 DIAGNOSIS — J4489 Other specified chronic obstructive pulmonary disease: Secondary | ICD-10-CM

## 2024-05-12 DIAGNOSIS — Z20828 Contact with and (suspected) exposure to other viral communicable diseases: Secondary | ICD-10-CM

## 2024-05-12 DIAGNOSIS — R040 Epistaxis: Secondary | ICD-10-CM

## 2024-05-12 MED ORDER — PREDNISONE 10 MG PO TABS: 10.0000 mg | ORAL_TABLET | Freq: Every day | ORAL | 2 refills | Status: AC

## 2024-05-12 MED ORDER — PREDNISONE 10 MG PO TABS: ORAL_TABLET | ORAL | 0 refills | Status: AC

## 2024-05-12 MED ORDER — OSELTAMIVIR PHOSPHATE 75 MG PO CAPS: 75.0000 mg | ORAL_CAPSULE | Freq: Two times a day (BID) | ORAL | 0 refills | Status: AC

## 2024-05-12 NOTE — Progress Notes (Addendum)
 "  @Patient  ID: Mario Proctor, male    DOB: [birth year], 84 y.o.   MRN: [id number]  Chief Complaint  Patient presents with   Medical Management of Chronic Issues    COPD f/u Pt states he is about the same since LOV, but is c/o frequent nose bleeds.     Referring provider: Chandra Toribio POUR, MD  HPI: 84 year old male, former smoker (57 pack year history) followed for severe asthma/COPD on biologic. He is a patient of Dr. Correne and last seen in office 03/21/2024 by Malachy NP. Past medical history significant for a fib, cardiomyopathy, chronic combined systolic and diastolic HF. He is also followed by the lung cancer screening program.  TEST/EVENTS:  01/07/2017 PFTs: FVC 3.96 (89%), FEV1 1.86 (58), ratio 47, TLC 113%, DLCO uncorrected 17.71 (52%).  Moderate obstructive airways disease 10/14/2020 CT chest without contrast: Atherosclerosis.  Scattered small mediastinal lymph nodes evident.  Centrilobular and paraseptal emphysema.  Previously identified nodular area in RLL resolved; some very subtle groundglass attenuation.  No new suspicious pulmonary nodule or mass.  Tiny nodule left apex unchanged.  Stable 2 cm exophytic lesion upper pole right kidney.  Recommended return for lung cancer screening. 03/15/2020 echocardiogram: EF 20 to 25%.  LV moderately dilated.  Dilated cardiomyopathy.  Mild LVH.  G1 DD.  Mild to moderate mitral regurgitation.  Mild to moderate tricuspid regurgitation.  Moderate pulmonary hypertension.  IVC dilated with poor inspiration collapse consistent with elevated right atrial pressure. 11/06/2020 echocardiogram: LVEF 30 to 35%.  Severe global hypokinesis.  G1 DD.  LA severely dilated.  Mild mitral regurgitation.  Mild tricuspid regurgitation.  Improvement in PASP from 44 to 20 mmHg.  05/03/2019 CT chest without contrast: Atherosclerosis.  Small mediastinal lymph nodes, including a 9 mm short axis AP window node, likely reactive.  2.7 x 4.2 cm irregular/spiculated masslike opacity  in the posterior RLL.  Mild associated hyperdensity within the lesion.  Adjacent debris's within RLL bronchi.  1.4 x 2.6 cm irregular nodular opacity in the posterior RUL and 7x12 mm irregular nodular opacity in the posterior LUL. Additional mild irregular linear/nodular opacity in RUL. Moderate centrilobular and paraseptal emphysematous changes. Mild superior endplate compression fx at L1. 05/04/2021 echocardiogram: LVEF 45 to 50%.  Mild LVH.  Diastolic parameters normal.  Trivial MVR.  No evidence of pulmonary hypertension 04/20/2022 CT chest: atherosclerosis. Severe emphysema. Changes of chronic bronchitis. Indistinct 4 mm LUL nodule, stable and considered benign. 2.1 cm ground glass LUL resolved. Simple kidney cyst. Chronic mild T12 fx.  01/11/2024 CXR: increased chronic interstitial changes with possible central peribronchial thickening suggestive of bronchitis   04/13/2023: OV with Dr. Meade. Former pt of Dr. Gladis. Here to establish. Been on Dupixent  since October. Doing well on this and Breztri . Last prednisone  August 2024. Independent with ADLs. Using albuterol  2-3 x/day. Still smoking 3-4/day. Completed pulm rehab 2023.   01/11/2024: Mario Proctor with Mario Proctor is an 84 year old male who presents with increased shortness of breath. His wife is with him and helps tor provide history.  He has experienced increased shortness of breath over the past couple of weeks. Symptoms started with a cough and a lot of mucus. He still has a cough with some phlegm, which is less severe than before, but he has been experiencing postnasal drainage causing him to feel 'choked up' during sleep the last two nights. He has not been using saline rinses or nasal sprays. He's not sure what color the  mucus is.  He uses his albuterol  rescue inhaler more frequently, multiple times a day. He also uses nebulizer treatments twice daily, consistent with his usual routine. No missed doses of Xarelto  have occurred, and he  had his last Dupixent  injection last Wednesday. He is awaiting the next shipment of Dupixent  due to pending paperwork. No fever, hemoptysis, or significant changes in sputum color are present. The patient reports that wheezing is noticed when he gets up and moves. He has not used antibiotics recently. No sore throat, ear pain, facial tenderness, CP, leg swelling  01/25/2024: OV with Jerzy Crotteau NP. Mario Proctor is an 84 year old male with severe lung disease who presents for follow-up after flare up.  He has experienced improvement in his breathing and a reduction in coughing since his last visit two weeks ago, although he does not feel completely back to his normal state. He was previously treated for bronchitis with z pack and prednisone .  He is currently off prednisone . He is on Dupixent  for maintenance therapy and uses Breztri  as a maintenance therapy and albuterol  for emergency purposes. No increased oxygen  requirements.  He does not recall the color of his productive cough. No significant amount of phlegm to spit out and did not feel worse after stopping prednisone . No fevers, hemoptysis, wheezing, chest congestion.   02/21/2024: OV with Aleigha Gilani NP Mario Proctor is an 84 year old male with asthma and COPD who presents with increased cough and chest congestion. He has experienced an increased cough and chest congestion for the past three to four days. Breathing feels a little more short. The cough is productive with clear sputum, and he has rhinorrhea with clear drainage. No headaches, fever, or hemoptysis are present. No increased wheezing that they've noticed. No leg swelling, CP, weight gain. No known sick exposures.  He is currently using Breztri  twice a day and has been using his nebulizer once a day. He previously used Mucinex  but has not continued it after finishing the last course. He is also using Flonase  nasal spray. No increased oxygen  requirements.  He has not yet obtained the  new nebulized medication, Ohtuvayre , due to cost issues, as it requires a $2,000 copay which he has not yet met. He thinks he is only about $100 away from this.   03/21/2024: OV with Kerrie Timm NP Mario Proctor is an 84 year old male with COPD who presents with difficulty sleeping due to sinus drainage. He has experienced difficulty sleeping over the past week, attributing it to sinus drainage that worsens when he lies down. Elevating his head by sitting up slightly has helped. He suspects that the drainage is causing him to feel 'clogged up' at night. He uses Flonase  nasal spray once daily, typically in the morning, but has not tried using it at night.  Breathing feels better than his last visit. Symptoms are worse some days than others but overall, feeling stable. Rare cough related to the sinus drainage. No change in sputum production. Usually clear to white. No wheezing. No fevers, chills, hemoptysis. No orthopnea, PND. He has not yet started using Otuvaer due to financial constraints but is in the process of completing the necessary paperwork for assistance. He notices an increased heart rate when he removes his oxygen . He is mindful of keeping his oxygen  saturation above 88%. No palpitations, CP.   05/12/2024: Today - follow up Discussed the use of AI scribe software for clinical note transcription with the patient, who gave  verbal consent to proceed. History of Present Illness  Mario Proctor is an 84 year old male who presents for intended follow up. His wife, who is usually with him, is not present today. She is home sick with the flu. They have been social distancing at home. He does not have any sick symptoms as of now per his report.   He does feel like he's been a little more short winded over the last several weeks. He's noticed more wheezing. Despite using Ohtuvayre  twice daily, he cannot determine its effectiveness. No fevers, chills, hemoptysis, body aches, sinus congestion,  headaches, chest pain, leg swelling.   Oxygen  therapy has been ongoing, and a humidifier was recently added to his oxygen  concentrator to address dryness. No increased oxygen  requirements.   Recurrent epistaxis has been a problem recently. He was seen in the ED a few days after our last visit with cauterization. He's been seeing ENT. His nose bleeds have improved but not entirely resolved. His last one was a few days ago. He has not used his flonase  or atrovent  nasal sprays since the nose bleeds. Afrin nasal spray has been used with some relief.     No Known Allergies  Immunization History  Administered Date(s) Administered   Fluad Quad(high Dose 65+) 02/23/2019, 02/08/2021   INFLUENZA, HIGH DOSE SEASONAL PF 02/15/2016, 04/16/2017, 04/04/2018, 03/11/2020, 01/19/2023, 03/09/2024   Influenza-Unspecified 03/11/2022   PFIZER(Purple Top)SARS-COV-2 Vaccination 06/15/2019, 07/10/2019, 04/02/2020   Pneumococcal-Unspecified 10/16/2010   Tdap 08/29/2020    Past Medical History:  Diagnosis Date   A-fib [facility])    Allergy    CHF (congestive heart failure) (HCC)    Dupuytren contracture    right sm finger   Myocardial infarct (HCC) 2019   Myocardial infarction [facility])    Small bowel obstruction (HCC)    Stage 4 very severe COPD by GOLD classification (HCC) 05/14/2020    Tobacco History: Social History   Tobacco Use  Smoking Status Some Days   Current packs/day: 2.00   Average packs/day: 2.0 packs/day for 50.0 years (100.0 ttl pk-yrs)   Types: Cigarettes   Passive exposure: Current  Smokeless Tobacco Never  Tobacco Comments   Sneaks a cigarette once in a while Updated 01/25/24   Ready to quit: Not Answered Counseling given: Not Answered Tobacco comments: Sneaks a cigarette once in a while Updated 01/25/24   Outpatient Medications Prior to Visit  Medication Sig Dispense Refill   albuterol  (ACCUNEB ) 1.25 MG/3ML nebulizer solution Take 3 mLs (1.25 mg total) by nebulization every 4  (four) hours as needed for wheezing or shortness of breath. 75 mL 5   albuterol  (VENTOLIN  HFA) 108 (90 Base) MCG/ACT inhaler INHALE 2 PUFFS EVERY 6 HOURS AS NEEDED FOR WHEEZE OR SHORTNESS OF BREATH 18 each 11   amLODipine  (NORVASC ) 5 MG tablet Take 1 tablet (5 mg total) by mouth daily. 90 tablet 0   atorvastatin  (LIPITOR) 10 MG tablet Take 1 tablet (10 mg total) by mouth daily. 90 tablet 3   Budeson-Glycopyrrol-Formoterol  (BREZTRI  AEROSPHERE) 160-9-4.8 MCG/ACT AERO Inhale 2 puffs into the lungs in the morning and at bedtime. 10.7 g 5   clobetasol  cream (TEMOVATE ) 0.05 % Apply 1 Application topically 2 (two) times daily as needed. 80 g 3   Dupilumab  (DUPIXENT ) 300 MG/2ML SOAJ Inject 300 mg into the skin every 14 (fourteen) days. 12 mL 1   Ensifentrine  (OHTUVAYRE ) 3 MG/2.5ML SUSP Inhale into the lungs in the morning and at bedtime.     hydrOXYzine  (VISTARIL )  25 MG capsule TAKE 1 CAPSULE BY MOUTH AT BEDTIME AS NEEDED FOR ITCHING 90 capsule 1   lisinopril  (ZESTRIL ) 10 MG tablet TAKE 1 TABLET BY MOUTH EVERYDAY AT BEDTIME 90 tablet 3   metoprolol  succinate (TOPROL -XL) 25 MG 24 hr tablet Take 1 tablet (25 mg total) by mouth daily. 90 tablet 3   XARELTO  20 MG TABS tablet TAKE 1 TABLET BY MOUTH DAILY WITH SUPPER. 90 tablet 3   fluticasone  (FLONASE ) 50 MCG/ACT nasal spray SPRAY 2 SPRAYS INTO EACH NOSTRIL EVERY DAY (Patient not taking: Reported on 05/12/2024) 48 mL 5   No facility-administered medications prior to visit.     Review of Systems: As above    Physical Exam:  BP 114/62   Pulse 63   Temp 98.1 F (36.7 C)   Ht 5' 11 (1.803 m) Comment: per pt  Wt 170 lb (77.1 kg) Comment: per pt  SpO2 93% Comment: 2L cont  BMI 23.71 kg/m   GEN: Pleasant, interactive, chronically-ill appearing; in no acute distress. HEENT:  Normocephalic and atraumatic.  PERRLA. Sclera white. Nasal turbinates pink, moist and patent bilaterally. No rhinorrhea present. Oropharynx pink and moist, without exudate or edema.  No lesions, ulcerations, or postnasal drip.  NECK:  Supple w/ fair ROM. No JVD present. No lymphadenopathy.   CV: Irregular rhythm, rate controlled, no m/r/g, no peripheral edema. Pulses intact, +2 bilaterally. No cyanosis, pallor or clubbing. PULMONARY:  Unlabored, regular breathing. Decreased airflow b/l with scattered wheezes. No accessory muscle use. No dullness to percussion.  GI: BS present and normoactive. Soft, non-tender to palpation.  MSK: No erythema, warmth or tenderness. Cap refil <2 sec all extrem. Muscle wasting  Neuro: A/Ox3. No focal deficits noted.   Skin: Warm, no lesions or rashe Psych: Normal affect and behavior. Judgement and thought content appropriate.     Lab Results:  CBC    Component Value Date/Time   WBC 9.3 03/24/2024 1057   RBC 4.21 (L) 03/24/2024 1057   HGB 12.5 (L) 03/24/2024 1057   HGB 14.0 03/09/2024 1007   HCT 40.6 03/24/2024 1057   HCT 45.3 03/09/2024 1007   PLT 296 03/24/2024 1057   PLT 266 03/09/2024 1007   MCV 96.4 03/24/2024 1057   MCV 95 03/09/2024 1007   MCH 29.7 03/24/2024 1057   MCHC 30.8 03/24/2024 1057   RDW 12.9 03/24/2024 1057   RDW 11.7 03/09/2024 1007   LYMPHSABS 2.1 03/09/2024 1007   MONOABS 0.7 04/24/2022 1002   EOSABS 0.4 03/09/2024 1007   BASOSABS 0.1 03/09/2024 1007    BMET    Component Value Date/Time   NA 141 03/24/2024 1057   NA 141 03/09/2024 1007   K 5.0 03/24/2024 1057   CL 100 03/24/2024 1057   CO2 29 03/24/2024 1057   GLUCOSE 95 03/24/2024 1057   BUN 25 (H) 03/24/2024 1057   BUN 18 03/09/2024 1007   CREATININE 1.31 (H) 03/24/2024 1057   CALCIUM  8.8 (L) 03/24/2024 1057   GFRNONAA 54 (L) 03/24/2024 1057   GFRAA 45 (L) 07/04/2020 0920    BNP    Component Value Date/Time   BNP 428.6 (H) 03/09/2024 1007   BNP 292.4 (H) 05/06/2021 9392     Imaging:  No results found.   Administration History     None          Latest Ref Rng & Units 09/14/2022    9:47 AM 01/07/2017   12:37 PM  PFT  Results  FVC-Pre L 2.71  3.63  FVC-Predicted Pre % 67  82   FVC-Post L 2.88  3.96   FVC-Predicted Post % 71  89   Pre FEV1/FVC % % 38  48   Post FEV1/FCV % % 37  47   FEV1-Pre L 1.02  1.74   FEV1-Predicted Pre % 35  54   FEV1-Post L 1.07  1.86   DLCO uncorrected ml/min/mmHg 6.56  15.81   DLCO UNC% % 26  46   DLCO corrected ml/min/mmHg 6.56  17.71   DLCO COR %Predicted % 26  52   DLVA Predicted % 29  55   TLC L 10.18  8.24   TLC % Predicted % 144  113   RV % Predicted % 242  152     No results found for: NITRICOXIDE      Assessment & Plan:   Assessment & Plan Chronic obstructive pulmonary disease with acute exacerbation COPD/asthma acute exacerbation, characterized by wheezing and increased dyspnea. No fever or hemoptysis. No flu-like symptoms. He has high symptom burden and prone to recurrent exacerbations despite biologic therapy. Peripheral eosinophils in October 400; no worsening eosinophilia while on dupixent . Will treat him with prednisone  taper and then have him stay on low dose daily prednisone  until follow up. Continue aggressive maintenance regimen. Action plan in place. - Prescribed prednisone  taper: 4 tablets for 2 days, 3 tablets for 2 days, 2 tablets for 2 days, then 10 mg daily  - Continue Ohtuvayre  twice daily. - Ensure oxygen  therapy is used to maintain oxygen  saturation >88-90% - Monitor oxygen  levels and heart rate when not using supplemental oxygen . - Continue Breztri  bid - Continue dupixent  injections - Continue albuterol  PRN - Continue guaifenesin  for mucociliary clearance   Epistaxis Recurrent epistaxis, possibly exacerbated by nasal sprays and Xarelto . Utilize afrin as needed. Aware of ED precautions. Follow up with ENT as scheduled.  - Use Afrin nasal spray as needed for epistaxis. - Contact ENT if epistaxis persists or worsens. - Ensure humidifier is used with oxygen  concentrator to prevent dryness. - Avoid intranasal steroid for time being    Exposure to influenza virus Recent exposure to influenza virus due to close contact with a family member who tested positive. No flu like or sick symptoms  - Prescribed Tamiflu for prophylactic use. - Advised taking Tamiflu with food to prevent gastrointestinal upset.  Chronic respiratory failure See above. Stable without increased O2 requirement. Goal >88-90%   I spent 35 minutes of dedicated to the care of this patient on the date of this encounter to include pre-visit review of records, face-to-face time with the patient discussing conditions above, post visit ordering of testing, clinical documentation with the electronic health record, making appropriate referrals as documented, and communicating necessary findings to members of the patients care team.   Comer LULLA Rouleau, NP 05/12/2024  Pt aware and understands NP's role.   "

## 2024-05-12 NOTE — Patient Instructions (Addendum)
 Continue Albuterol  inhaler 2 puffs or 3 mL neb every 6 hours as needed for shortness of breath or wheezing. Notify if symptoms persist despite rescue inhaler/neb use. Use your nebs 2-3 times a day until wheezing resolves Continue Breztri  2 puffs Twice daily. Brush tongue and rinse mouth afterwards Continue dupixent  injections every 2 weeks Continue supplemental oxygen  2  lpm as needed with activity and at night for goal >88-90% Continue Guaifenesin  or guaifenesin  DM (Mucinex  or Mucinex  DM; store brand version is fine) over the counter for cough/congestion Continue ohtuvayre  2.5 mL neb Twice daily   Use a wedge pillow at night to help elevate the head of your bed   Prednisone  taper. 4 tabs for 2 days, then 3 tabs for 2 days, 2 tabs for 2 days, then stay on 10 mg daily. Take in AM with food  Tamiflu - take 1 tab Twice daily for 5 days. Take with food   Ok to stay off the nose sprays right now Let Ear,Nose and Throat know if you keep having the nose bleeds Try a saline nasal gel 1-2 times a day in each nostril to help moisturize your nose Go to the emergency department if your nose starts bleeding again and you can't get it to stop   Follow up in 6 weeks in any new MD to establish care. If symptoms do not improve or worsen, please contact office for sooner follow up or seek emergency care.

## 2024-05-18 ENCOUNTER — Telehealth: Payer: Self-pay | Admitting: Pharmacist

## 2024-05-18 NOTE — Telephone Encounter (Signed)
 Received a fax from  [employer] regarding an approval for OHTUVAYRE  patient assistance from 05/17/2024 to 05/10/2025. Approval letter sent to scan center.  Phone#: [phone] Fax#: [phone]   Sherry Pennant, PharmD, MPH, BCPS, CPP Clinical Pharmacist

## 2024-06-01 ENCOUNTER — Other Ambulatory Visit: Payer: Self-pay | Admitting: Cardiology

## 2024-06-01 DIAGNOSIS — I10 Essential (primary) hypertension: Secondary | ICD-10-CM

## 2024-06-08 ENCOUNTER — Other Ambulatory Visit: Payer: Self-pay | Admitting: Cardiology

## 2024-06-08 DIAGNOSIS — I700 Atherosclerosis of aorta: Secondary | ICD-10-CM

## 2024-06-15 ENCOUNTER — Other Ambulatory Visit: Payer: Self-pay | Admitting: Nurse Practitioner

## 2024-06-15 DIAGNOSIS — J310 Chronic rhinitis: Secondary | ICD-10-CM

## 2024-06-28 ENCOUNTER — Encounter

## 2024-06-29 ENCOUNTER — Ambulatory Visit: Admitting: Cardiology

## 2024-09-07 ENCOUNTER — Ambulatory Visit: Admitting: Family Medicine
